# Patient Record
Sex: Female | Born: 1945 | Race: White | Hispanic: No | Marital: Married | Smoking: Never smoker
Health system: Southern US, Community
[De-identification: ages and names within clinical notes are randomized; demographics above are authoritative.]

## PROBLEM LIST (undated history)

## (undated) DIAGNOSIS — I809 Phlebitis and thrombophlebitis of unspecified site: Secondary | ICD-10-CM

## (undated) DIAGNOSIS — C50919 Malignant neoplasm of unspecified site of unspecified female breast: Secondary | ICD-10-CM

## (undated) DIAGNOSIS — R011 Cardiac murmur, unspecified: Secondary | ICD-10-CM

## (undated) DIAGNOSIS — R7989 Other specified abnormal findings of blood chemistry: Secondary | ICD-10-CM

## (undated) DIAGNOSIS — K219 Gastro-esophageal reflux disease without esophagitis: Secondary | ICD-10-CM

## (undated) DIAGNOSIS — T148XXA Other injury of unspecified body region, initial encounter: Secondary | ICD-10-CM

## (undated) DIAGNOSIS — M8580 Other specified disorders of bone density and structure, unspecified site: Secondary | ICD-10-CM

## (undated) DIAGNOSIS — M5090 Cervical disc disorder, unspecified, unspecified cervical region: Secondary | ICD-10-CM

## (undated) DIAGNOSIS — M1990 Unspecified osteoarthritis, unspecified site: Secondary | ICD-10-CM

## (undated) DIAGNOSIS — R109 Unspecified abdominal pain: Secondary | ICD-10-CM

## (undated) DIAGNOSIS — I10 Essential (primary) hypertension: Secondary | ICD-10-CM

## (undated) DIAGNOSIS — G454 Transient global amnesia: Secondary | ICD-10-CM

## (undated) DIAGNOSIS — M4800 Spinal stenosis, site unspecified: Secondary | ICD-10-CM

## (undated) DIAGNOSIS — R945 Abnormal results of liver function studies: Secondary | ICD-10-CM

## (undated) HISTORY — DX: Essential (primary) hypertension: I10

## (undated) HISTORY — PX: TONSILLECTOMY: SUR1361

## (undated) HISTORY — DX: Cervical disc disorder, unspecified, unspecified cervical region: M50.90

## (undated) HISTORY — DX: Unspecified osteoarthritis, unspecified site: M19.90

## (undated) HISTORY — DX: Spinal stenosis, site unspecified: M48.00

## (undated) HISTORY — PX: CHOLECYSTECTOMY: SHX55

## (undated) HISTORY — PX: FOOT SURGERY: SHX648

## (undated) HISTORY — DX: Other injury of unspecified body region, initial encounter: T14.8XXA

## (undated) HISTORY — PX: KNEE ARTHROSCOPY: SUR90

## (undated) HISTORY — DX: Transient global amnesia: G45.4

## (undated) HISTORY — DX: Gastro-esophageal reflux disease without esophagitis: K21.9

## (undated) HISTORY — PX: HAND SURGERY: SHX662

## (undated) HISTORY — DX: Cardiac murmur, unspecified: R01.1

## (undated) HISTORY — PX: TOTAL KNEE ARTHROPLASTY: SHX125

## (undated) HISTORY — DX: Abnormal results of liver function studies: R94.5

## (undated) HISTORY — DX: Phlebitis and thrombophlebitis of unspecified site: I80.9

## (undated) HISTORY — DX: Other specified abnormal findings of blood chemistry: R79.89

## (undated) HISTORY — DX: Malignant neoplasm of unspecified site of unspecified female breast: C50.919

## (undated) HISTORY — PX: BUNIONECTOMY: SHX129

## (undated) HISTORY — DX: Other specified disorders of bone density and structure, unspecified site: M85.80

## (undated) HISTORY — DX: Unspecified abdominal pain: R10.9

## (undated) HISTORY — PX: REPLACEMENT UNICONDYLAR JOINT KNEE: SUR1227

---

## 2001-02-14 LAB — HM COLONOSCOPY

## 2003-02-03 ENCOUNTER — Other Ambulatory Visit: Admission: RE | Admit: 2003-02-03 | Discharge: 2003-02-03 | Payer: Self-pay | Admitting: Internal Medicine

## 2003-06-13 ENCOUNTER — Encounter: Payer: Self-pay | Admitting: Internal Medicine

## 2003-07-14 ENCOUNTER — Encounter: Payer: Self-pay | Admitting: Internal Medicine

## 2003-09-18 ENCOUNTER — Encounter: Payer: Self-pay | Admitting: Internal Medicine

## 2003-12-30 ENCOUNTER — Ambulatory Visit: Payer: Self-pay | Admitting: Internal Medicine

## 2004-03-01 ENCOUNTER — Ambulatory Visit: Payer: Self-pay | Admitting: Internal Medicine

## 2004-03-06 ENCOUNTER — Emergency Department (HOSPITAL_COMMUNITY): Admission: EM | Admit: 2004-03-06 | Discharge: 2004-03-06 | Payer: Self-pay | Admitting: Emergency Medicine

## 2004-03-31 ENCOUNTER — Other Ambulatory Visit: Admission: RE | Admit: 2004-03-31 | Discharge: 2004-03-31 | Payer: Self-pay | Admitting: Internal Medicine

## 2004-03-31 ENCOUNTER — Ambulatory Visit: Payer: Self-pay | Admitting: Internal Medicine

## 2005-01-11 ENCOUNTER — Encounter: Admission: RE | Admit: 2005-01-11 | Discharge: 2005-01-11 | Payer: Self-pay | Admitting: Internal Medicine

## 2005-03-29 ENCOUNTER — Ambulatory Visit: Payer: Self-pay | Admitting: Internal Medicine

## 2005-04-06 ENCOUNTER — Ambulatory Visit: Payer: Self-pay | Admitting: Internal Medicine

## 2005-04-06 ENCOUNTER — Other Ambulatory Visit: Admission: RE | Admit: 2005-04-06 | Discharge: 2005-04-06 | Payer: Self-pay | Admitting: Internal Medicine

## 2005-04-06 ENCOUNTER — Encounter: Payer: Self-pay | Admitting: Internal Medicine

## 2005-05-03 ENCOUNTER — Ambulatory Visit: Payer: Self-pay | Admitting: Internal Medicine

## 2005-05-10 ENCOUNTER — Ambulatory Visit: Payer: Self-pay | Admitting: Internal Medicine

## 2005-05-13 ENCOUNTER — Encounter: Admission: RE | Admit: 2005-05-13 | Discharge: 2005-05-13 | Payer: Self-pay | Admitting: Internal Medicine

## 2005-06-10 ENCOUNTER — Ambulatory Visit: Payer: Self-pay | Admitting: Internal Medicine

## 2005-06-17 ENCOUNTER — Ambulatory Visit: Payer: Self-pay | Admitting: Internal Medicine

## 2005-08-03 ENCOUNTER — Ambulatory Visit: Payer: Self-pay | Admitting: Internal Medicine

## 2005-09-08 ENCOUNTER — Encounter: Payer: Self-pay | Admitting: Internal Medicine

## 2005-09-16 ENCOUNTER — Encounter: Admission: RE | Admit: 2005-09-16 | Discharge: 2005-09-16 | Payer: Self-pay | Admitting: Rheumatology

## 2005-10-10 ENCOUNTER — Encounter: Payer: Self-pay | Admitting: Internal Medicine

## 2005-11-22 ENCOUNTER — Encounter: Payer: Self-pay | Admitting: Internal Medicine

## 2005-11-30 ENCOUNTER — Encounter: Admission: RE | Admit: 2005-11-30 | Discharge: 2005-11-30 | Payer: Self-pay | Admitting: Rheumatology

## 2005-12-14 ENCOUNTER — Encounter: Payer: Self-pay | Admitting: Internal Medicine

## 2006-04-28 ENCOUNTER — Encounter: Payer: Self-pay | Admitting: Internal Medicine

## 2006-05-23 ENCOUNTER — Ambulatory Visit: Payer: Self-pay | Admitting: Internal Medicine

## 2006-05-23 LAB — CONVERTED CEMR LAB
ALT: 23 units/L (ref 0–40)
AST: 24 units/L (ref 0–37)
Albumin: 4.2 g/dL (ref 3.5–5.2)
Alkaline Phosphatase: 82 units/L (ref 39–117)
BUN: 16 mg/dL (ref 6–23)
Basophils Absolute: 0 10*3/uL (ref 0.0–0.1)
Basophils Relative: 0.6 % (ref 0.0–1.0)
Bilirubin, Direct: 0.1 mg/dL (ref 0.0–0.3)
CO2: 33 meq/L — ABNORMAL HIGH (ref 19–32)
Calcium: 9.7 mg/dL (ref 8.4–10.5)
Chloride: 107 meq/L (ref 96–112)
Cholesterol: 181 mg/dL (ref 0–200)
Creatinine, Ser: 0.8 mg/dL (ref 0.4–1.2)
Eosinophils Absolute: 0.2 10*3/uL (ref 0.0–0.6)
Eosinophils Relative: 2.8 % (ref 0.0–5.0)
GFR calc Af Amer: 94 mL/min
GFR calc non Af Amer: 78 mL/min
Glucose, Bld: 98 mg/dL (ref 70–99)
HCT: 40.3 % (ref 36.0–46.0)
HDL: 57.6 mg/dL (ref >39.0)
Hemoglobin: 13.9 g/dL (ref 12.0–15.0)
LDL Cholesterol: 103 mg/dL — ABNORMAL HIGH (ref 0–99)
Lymphocytes Relative: 19.7 % (ref 12.0–46.0)
MCHC: 34.5 g/dL (ref 30.0–36.0)
MCV: 96.3 fL (ref 78.0–100.0)
Monocytes Absolute: 0.4 10*3/uL (ref 0.2–0.7)
Monocytes Relative: 6.6 % (ref 3.0–11.0)
Neutro Abs: 4 10*3/uL (ref 1.4–7.7)
Neutrophils Relative %: 70.3 % (ref 43.0–77.0)
Platelets: 253 10*3/uL (ref 150–400)
Potassium: 4.1 meq/L (ref 3.5–5.1)
RBC: 4.18 M/uL (ref 3.87–5.11)
RDW: 13.6 % (ref 11.5–14.6)
Sodium: 147 meq/L — ABNORMAL HIGH (ref 135–145)
TSH: 1.69 microintl units/mL (ref 0.35–5.50)
Total Bilirubin: 0.8 mg/dL (ref 0.3–1.2)
Total CHOL/HDL Ratio: 3.1
Total Protein: 7.3 g/dL (ref 6.0–8.3)
Triglycerides: 103 mg/dL (ref 0–149)
VLDL: 21 mg/dL (ref 0–40)
WBC: 5.7 10*3/uL (ref 4.5–10.5)

## 2006-05-31 ENCOUNTER — Encounter: Payer: Self-pay | Admitting: Internal Medicine

## 2006-05-31 ENCOUNTER — Ambulatory Visit: Payer: Self-pay | Admitting: Internal Medicine

## 2006-05-31 ENCOUNTER — Other Ambulatory Visit: Admission: RE | Admit: 2006-05-31 | Discharge: 2006-05-31 | Payer: Self-pay | Admitting: Internal Medicine

## 2006-05-31 LAB — CONVERTED CEMR LAB

## 2006-06-02 ENCOUNTER — Encounter: Payer: Self-pay | Admitting: Internal Medicine

## 2006-06-02 ENCOUNTER — Ambulatory Visit: Payer: Self-pay | Admitting: Internal Medicine

## 2006-07-05 ENCOUNTER — Ambulatory Visit: Payer: Self-pay | Admitting: Internal Medicine

## 2006-07-05 LAB — CONVERTED CEMR LAB
Calcium, Total (PTH): 9.7 mg/dL (ref 8.4–10.5)
PTH: 40.1 pg/mL (ref 14.0–72.0)
Vit D, 1,25-Dihydroxy: 40 (ref 20–57)

## 2006-07-12 ENCOUNTER — Ambulatory Visit: Payer: Self-pay | Admitting: Internal Medicine

## 2006-08-17 ENCOUNTER — Encounter: Payer: Self-pay | Admitting: Internal Medicine

## 2006-08-17 DIAGNOSIS — M1990 Unspecified osteoarthritis, unspecified site: Secondary | ICD-10-CM | POA: Insufficient documentation

## 2006-08-17 DIAGNOSIS — J309 Allergic rhinitis, unspecified: Secondary | ICD-10-CM | POA: Insufficient documentation

## 2006-08-17 DIAGNOSIS — K219 Gastro-esophageal reflux disease without esophagitis: Secondary | ICD-10-CM | POA: Insufficient documentation

## 2006-08-17 DIAGNOSIS — G479 Sleep disorder, unspecified: Secondary | ICD-10-CM | POA: Insufficient documentation

## 2006-09-12 ENCOUNTER — Encounter: Payer: Self-pay | Admitting: Internal Medicine

## 2006-10-06 ENCOUNTER — Encounter: Admission: RE | Admit: 2006-10-06 | Discharge: 2006-10-06 | Payer: Self-pay | Admitting: Internal Medicine

## 2006-11-13 ENCOUNTER — Telehealth: Payer: Self-pay | Admitting: Internal Medicine

## 2006-12-04 ENCOUNTER — Ambulatory Visit: Payer: Self-pay | Admitting: Internal Medicine

## 2007-04-04 ENCOUNTER — Encounter: Payer: Self-pay | Admitting: Internal Medicine

## 2007-04-17 ENCOUNTER — Telehealth (INDEPENDENT_AMBULATORY_CARE_PROVIDER_SITE_OTHER): Payer: Self-pay | Admitting: *Deleted

## 2007-05-29 ENCOUNTER — Ambulatory Visit: Payer: Self-pay | Admitting: Internal Medicine

## 2007-05-29 LAB — CONVERTED CEMR LAB
Bilirubin Urine: NEGATIVE
Blood in Urine, dipstick: NEGATIVE
Glucose, Urine, Semiquant: NEGATIVE
Ketones, urine, test strip: NEGATIVE
Nitrite: NEGATIVE
Protein, U semiquant: NEGATIVE
Specific Gravity, Urine: 1.02
Urobilinogen, UA: 0.2
WBC Urine, dipstick: NEGATIVE
pH: 5.5

## 2007-06-05 LAB — CONVERTED CEMR LAB
ALT: 40 units/L — ABNORMAL HIGH (ref 0–35)
AST: 41 units/L — ABNORMAL HIGH (ref 0–37)
Albumin: 4.1 g/dL (ref 3.5–5.2)
Alkaline Phosphatase: 94 units/L (ref 39–117)
BUN: 22 mg/dL (ref 6–23)
Basophils Absolute: 0 10*3/uL (ref 0.0–0.1)
Basophils Relative: 0 % (ref 0.0–1.0)
Bilirubin, Direct: 0.1 mg/dL (ref 0.0–0.3)
CO2: 32 meq/L (ref 19–32)
Calcium: 9.7 mg/dL (ref 8.4–10.5)
Chloride: 106 meq/L (ref 96–112)
Cholesterol: 225 mg/dL (ref 0–200)
Creatinine, Ser: 0.7 mg/dL (ref 0.4–1.2)
Direct LDL: 132.4 mg/dL
Eosinophils Absolute: 0.2 10*3/uL (ref 0.0–0.7)
Eosinophils Relative: 5 % (ref 0.0–5.0)
GFR calc Af Amer: 109 mL/min
GFR calc non Af Amer: 90 mL/min
Glucose, Bld: 68 mg/dL — ABNORMAL LOW (ref 70–99)
HCT: 43 % (ref 36.0–46.0)
HDL: 81.3 mg/dL (ref >39.0)
Hemoglobin: 14.5 g/dL (ref 12.0–15.0)
Lymphocytes Relative: 29.1 % (ref 12.0–46.0)
MCHC: 33.7 g/dL (ref 30.0–36.0)
MCV: 97.2 fL (ref 78.0–100.0)
Monocytes Absolute: 0.3 10*3/uL (ref 0.1–1.0)
Monocytes Relative: 7.6 % (ref 3.0–12.0)
Neutro Abs: 2.6 10*3/uL (ref 1.4–7.7)
Neutrophils Relative %: 58.3 % (ref 43.0–77.0)
Platelets: 207 10*3/uL (ref 150–400)
Potassium: 4.8 meq/L (ref 3.5–5.1)
RBC: 4.42 M/uL (ref 3.87–5.11)
RDW: 12.7 % (ref 11.5–14.6)
Sodium: 144 meq/L (ref 135–145)
TSH: 2.77 microintl units/mL (ref 0.35–5.50)
Total Bilirubin: 0.6 mg/dL (ref 0.3–1.2)
Total CHOL/HDL Ratio: 2.8
Total Protein: 7.2 g/dL (ref 6.0–8.3)
Triglycerides: 54 mg/dL (ref 0–149)
VLDL: 11 mg/dL (ref 0–40)
WBC: 4.4 10*3/uL — ABNORMAL LOW (ref 4.5–10.5)

## 2007-06-06 ENCOUNTER — Ambulatory Visit: Payer: Self-pay | Admitting: Internal Medicine

## 2007-06-06 ENCOUNTER — Other Ambulatory Visit: Admission: RE | Admit: 2007-06-06 | Discharge: 2007-06-06 | Payer: Self-pay | Admitting: Internal Medicine

## 2007-06-06 ENCOUNTER — Encounter: Payer: Self-pay | Admitting: Internal Medicine

## 2007-06-06 DIAGNOSIS — I8390 Asymptomatic varicose veins of unspecified lower extremity: Secondary | ICD-10-CM | POA: Insufficient documentation

## 2007-06-06 DIAGNOSIS — R740 Nonspecific elevation of levels of transaminase and lactic acid dehydrogenase [LDH]: Secondary | ICD-10-CM

## 2007-06-06 DIAGNOSIS — R011 Cardiac murmur, unspecified: Secondary | ICD-10-CM | POA: Insufficient documentation

## 2007-06-06 DIAGNOSIS — R0989 Other specified symptoms and signs involving the circulatory and respiratory systems: Secondary | ICD-10-CM | POA: Insufficient documentation

## 2007-06-06 LAB — CONVERTED CEMR LAB: Pap Smear: NORMAL

## 2007-06-27 ENCOUNTER — Encounter: Payer: Self-pay | Admitting: Internal Medicine

## 2007-07-03 ENCOUNTER — Encounter: Payer: Self-pay | Admitting: Internal Medicine

## 2007-07-03 ENCOUNTER — Ambulatory Visit: Payer: Self-pay

## 2007-08-09 ENCOUNTER — Encounter: Payer: Self-pay | Admitting: Internal Medicine

## 2007-10-11 ENCOUNTER — Emergency Department (HOSPITAL_COMMUNITY): Admission: EM | Admit: 2007-10-11 | Discharge: 2007-10-11 | Payer: Self-pay | Admitting: Emergency Medicine

## 2007-10-12 ENCOUNTER — Ambulatory Visit: Payer: Self-pay | Admitting: Internal Medicine

## 2007-10-12 DIAGNOSIS — G454 Transient global amnesia: Secondary | ICD-10-CM | POA: Insufficient documentation

## 2007-10-12 DIAGNOSIS — I10 Essential (primary) hypertension: Secondary | ICD-10-CM | POA: Insufficient documentation

## 2007-10-24 ENCOUNTER — Encounter: Payer: Self-pay | Admitting: Internal Medicine

## 2007-11-21 ENCOUNTER — Ambulatory Visit: Payer: Self-pay | Admitting: Vascular Surgery

## 2007-12-03 ENCOUNTER — Ambulatory Visit: Payer: Self-pay | Admitting: Internal Medicine

## 2007-12-05 ENCOUNTER — Ambulatory Visit: Payer: Self-pay | Admitting: Vascular Surgery

## 2007-12-21 ENCOUNTER — Ambulatory Visit: Payer: Self-pay | Admitting: Vascular Surgery

## 2007-12-24 ENCOUNTER — Telehealth: Payer: Self-pay | Admitting: *Deleted

## 2008-01-03 ENCOUNTER — Encounter: Payer: Self-pay | Admitting: Internal Medicine

## 2008-02-15 LAB — HM DEXA SCAN

## 2008-03-07 ENCOUNTER — Encounter: Payer: Self-pay | Admitting: Internal Medicine

## 2008-03-13 ENCOUNTER — Encounter: Payer: Self-pay | Admitting: Internal Medicine

## 2008-05-19 ENCOUNTER — Ambulatory Visit: Payer: Self-pay | Admitting: Internal Medicine

## 2008-05-19 LAB — CONVERTED CEMR LAB
ALT: 35 units/L (ref 0–35)
AST: 42 units/L — ABNORMAL HIGH (ref 0–37)
Albumin: 4.1 g/dL (ref 3.5–5.2)
Alkaline Phosphatase: 88 units/L (ref 39–117)
BUN: 21 mg/dL (ref 6–23)
Basophils Absolute: 0 10*3/uL (ref 0.0–0.1)
Basophils Relative: 0.2 % (ref 0.0–3.0)
Bilirubin Urine: NEGATIVE
Bilirubin, Direct: 0.1 mg/dL (ref 0.0–0.3)
Blood in Urine, dipstick: NEGATIVE
CO2: 24 meq/L (ref 19–32)
Calcium: 9.6 mg/dL (ref 8.4–10.5)
Chloride: 106 meq/L (ref 96–112)
Cholesterol: 218 mg/dL — ABNORMAL HIGH (ref 0–200)
Creatinine, Ser: 0.8 mg/dL (ref 0.4–1.2)
Direct LDL: 126.1 mg/dL
Eosinophils Absolute: 0.4 10*3/uL (ref 0.0–0.7)
Eosinophils Relative: 7.9 % — ABNORMAL HIGH (ref 0.0–5.0)
GFR calc non Af Amer: 76.83 mL/min (ref >60)
Glucose, Bld: 89 mg/dL (ref 70–99)
Glucose, Urine, Semiquant: NEGATIVE
HCT: 40 % (ref 36.0–46.0)
HDL: 66.7 mg/dL (ref >39.00)
Hemoglobin: 13.6 g/dL (ref 12.0–15.0)
Ketones, urine, test strip: NEGATIVE
Lymphocytes Relative: 27.8 % (ref 12.0–46.0)
Lymphs Abs: 1.3 10*3/uL (ref 0.7–4.0)
MCHC: 34.1 g/dL (ref 30.0–36.0)
MCV: 96.8 fL (ref 78.0–100.0)
Monocytes Absolute: 0.4 10*3/uL (ref 0.1–1.0)
Monocytes Relative: 7.7 % (ref 3.0–12.0)
Neutro Abs: 2.7 10*3/uL (ref 1.4–7.7)
Neutrophils Relative %: 56.4 % (ref 43.0–77.0)
Nitrite: NEGATIVE
Platelets: 207 10*3/uL (ref 150.0–400.0)
Potassium: 5.5 meq/L — ABNORMAL HIGH (ref 3.5–5.1)
RBC: 4.13 M/uL (ref 3.87–5.11)
RDW: 12.3 % (ref 11.5–14.6)
Sodium: 143 meq/L (ref 135–145)
Specific Gravity, Urine: 1.025
TSH: 1.92 microintl units/mL (ref 0.35–5.50)
Total Bilirubin: 0.7 mg/dL (ref 0.3–1.2)
Total CHOL/HDL Ratio: 3
Total Protein: 6.9 g/dL (ref 6.0–8.3)
Triglycerides: 97 mg/dL (ref 0.0–149.0)
Urobilinogen, UA: 0.2
VLDL: 19.4 mg/dL (ref 0.0–40.0)
Vit D, 25-Hydroxy: 36 ng/mL (ref 30–89)
WBC Urine, dipstick: NEGATIVE
WBC: 4.8 10*3/uL (ref 4.5–10.5)
pH: 7

## 2008-05-27 ENCOUNTER — Ambulatory Visit: Payer: Self-pay | Admitting: Internal Medicine

## 2008-05-27 DIAGNOSIS — E875 Hyperkalemia: Secondary | ICD-10-CM | POA: Insufficient documentation

## 2008-05-27 DIAGNOSIS — D485 Neoplasm of uncertain behavior of skin: Secondary | ICD-10-CM | POA: Insufficient documentation

## 2008-06-10 LAB — CONVERTED CEMR LAB
BUN: 25 mg/dL — ABNORMAL HIGH (ref 6–23)
CO2: 32 meq/L (ref 19–32)
Calcium: 9.7 mg/dL (ref 8.4–10.5)
Chloride: 104 meq/L (ref 96–112)
Creatinine, Ser: 1 mg/dL (ref 0.4–1.2)
GFR calc non Af Amer: 59.38 mL/min (ref >60)
Glucose, Bld: 81 mg/dL (ref 70–99)
Potassium: 5 meq/L (ref 3.5–5.1)
Sodium: 142 meq/L (ref 135–145)

## 2008-07-15 ENCOUNTER — Encounter: Payer: Self-pay | Admitting: *Deleted

## 2008-07-15 ENCOUNTER — Encounter: Payer: Self-pay | Admitting: Internal Medicine

## 2008-07-15 LAB — CONVERTED CEMR LAB
ALT: 31 units/L
AST: 26 units/L
Creatinine, Ser: 1.03 mg/dL
HCT: 39.7 %
Hemoglobin: 12.9 g/dL

## 2008-07-28 ENCOUNTER — Ambulatory Visit: Payer: Self-pay | Admitting: Internal Medicine

## 2008-07-28 ENCOUNTER — Encounter: Payer: Self-pay | Admitting: Internal Medicine

## 2008-07-28 ENCOUNTER — Encounter: Payer: Self-pay | Admitting: *Deleted

## 2008-07-29 ENCOUNTER — Encounter: Payer: Self-pay | Admitting: Internal Medicine

## 2008-08-13 ENCOUNTER — Encounter: Payer: Self-pay | Admitting: Internal Medicine

## 2008-08-22 ENCOUNTER — Encounter: Admission: RE | Admit: 2008-08-22 | Discharge: 2008-08-22 | Payer: Self-pay | Admitting: Neurosurgery

## 2008-09-22 ENCOUNTER — Ambulatory Visit: Payer: Self-pay | Admitting: Internal Medicine

## 2008-09-22 LAB — CONVERTED CEMR LAB
ALT: 37 units/L — ABNORMAL HIGH (ref 0–35)
AST: 27 units/L (ref 0–37)
Albumin: 4.1 g/dL (ref 3.5–5.2)
Alkaline Phosphatase: 79 units/L (ref 39–117)
Bilirubin, Direct: 0 mg/dL (ref 0.0–0.3)
Total Bilirubin: 0.8 mg/dL (ref 0.3–1.2)
Total Protein: 7.4 g/dL (ref 6.0–8.3)

## 2008-09-30 ENCOUNTER — Ambulatory Visit: Payer: Self-pay | Admitting: Internal Medicine

## 2008-09-30 DIAGNOSIS — M549 Dorsalgia, unspecified: Secondary | ICD-10-CM | POA: Insufficient documentation

## 2008-10-13 ENCOUNTER — Encounter: Payer: Self-pay | Admitting: Internal Medicine

## 2008-12-24 ENCOUNTER — Encounter: Admission: RE | Admit: 2008-12-24 | Discharge: 2008-12-24 | Payer: Self-pay | Admitting: Neurosurgery

## 2008-12-25 ENCOUNTER — Encounter (INDEPENDENT_AMBULATORY_CARE_PROVIDER_SITE_OTHER): Payer: Self-pay | Admitting: *Deleted

## 2009-01-12 ENCOUNTER — Encounter (INDEPENDENT_AMBULATORY_CARE_PROVIDER_SITE_OTHER): Payer: Self-pay | Admitting: *Deleted

## 2009-01-16 ENCOUNTER — Ambulatory Visit: Payer: Self-pay | Admitting: Internal Medicine

## 2009-01-16 LAB — CONVERTED CEMR LAB
ALT: 30 units/L (ref 0–35)
AST: 28 units/L (ref 0–37)
Albumin: 3.9 g/dL (ref 3.5–5.2)
Alkaline Phosphatase: 77 units/L (ref 39–117)
Bilirubin, Direct: 0.1 mg/dL (ref 0.0–0.3)
Total Bilirubin: 0.8 mg/dL (ref 0.3–1.2)
Total Protein: 6.7 g/dL (ref 6.0–8.3)

## 2009-01-26 ENCOUNTER — Ambulatory Visit: Payer: Self-pay | Admitting: Internal Medicine

## 2009-01-26 DIAGNOSIS — R259 Unspecified abnormal involuntary movements: Secondary | ICD-10-CM | POA: Insufficient documentation

## 2009-01-26 DIAGNOSIS — R209 Unspecified disturbances of skin sensation: Secondary | ICD-10-CM | POA: Insufficient documentation

## 2009-02-14 LAB — HM MAMMOGRAPHY

## 2009-04-17 ENCOUNTER — Encounter: Payer: Self-pay | Admitting: Internal Medicine

## 2009-04-23 ENCOUNTER — Encounter: Payer: Self-pay | Admitting: Internal Medicine

## 2009-06-16 ENCOUNTER — Ambulatory Visit: Payer: Self-pay | Admitting: Internal Medicine

## 2009-06-16 LAB — CONVERTED CEMR LAB
ALT: 23 units/L (ref 0–35)
AST: 26 units/L (ref 0–37)
Albumin: 4.1 g/dL (ref 3.5–5.2)
Alkaline Phosphatase: 81 units/L (ref 39–117)
BUN: 18 mg/dL (ref 6–23)
Basophils Absolute: 0.1 10*3/uL (ref 0.0–0.1)
Basophils Relative: 1.3 % (ref 0.0–3.0)
Bilirubin Urine: NEGATIVE
Bilirubin, Direct: 0 mg/dL (ref 0.0–0.3)
Blood in Urine, dipstick: NEGATIVE
CO2: 31 meq/L (ref 19–32)
Calcium: 9.5 mg/dL (ref 8.4–10.5)
Chloride: 105 meq/L (ref 96–112)
Cholesterol: 198 mg/dL (ref 0–200)
Creatinine, Ser: 0.9 mg/dL (ref 0.4–1.2)
Eosinophils Absolute: 0.3 10*3/uL (ref 0.0–0.7)
Eosinophils Relative: 8.7 % — ABNORMAL HIGH (ref 0.0–5.0)
GFR calc non Af Amer: 66.84 mL/min (ref >60)
Glucose, Bld: 85 mg/dL (ref 70–99)
Glucose, Urine, Semiquant: NEGATIVE
HCT: 40.1 % (ref 36.0–46.0)
HDL: 67.6 mg/dL (ref >39.00)
Hemoglobin: 13.8 g/dL (ref 12.0–15.0)
Ketones, urine, test strip: NEGATIVE
LDL Cholesterol: 112 mg/dL — ABNORMAL HIGH (ref 0–99)
Lymphocytes Relative: 31.8 % (ref 12.0–46.0)
Lymphs Abs: 1.2 10*3/uL (ref 0.7–4.0)
MCHC: 34.3 g/dL (ref 30.0–36.0)
MCV: 98 fL (ref 78.0–100.0)
Monocytes Absolute: 0.3 10*3/uL (ref 0.1–1.0)
Monocytes Relative: 7.6 % (ref 3.0–12.0)
Neutro Abs: 1.9 10*3/uL (ref 1.4–7.7)
Neutrophils Relative %: 50.6 % (ref 43.0–77.0)
Nitrite: NEGATIVE
Platelets: 205 10*3/uL (ref 150.0–400.0)
Potassium: 5.1 meq/L (ref 3.5–5.1)
Protein, U semiquant: NEGATIVE
RBC: 4.09 M/uL (ref 3.87–5.11)
RDW: 13.7 % (ref 11.5–14.6)
Sodium: 142 meq/L (ref 135–145)
Specific Gravity, Urine: 1.02
TSH: 3.09 microintl units/mL (ref 0.35–5.50)
Total Bilirubin: 0.5 mg/dL (ref 0.3–1.2)
Total CHOL/HDL Ratio: 3
Total Protein: 6.9 g/dL (ref 6.0–8.3)
Triglycerides: 94 mg/dL (ref 0.0–149.0)
Urobilinogen, UA: 0.2
VLDL: 18.8 mg/dL (ref 0.0–40.0)
WBC Urine, dipstick: NEGATIVE
WBC: 3.8 10*3/uL — ABNORMAL LOW (ref 4.5–10.5)
pH: 6

## 2009-06-23 ENCOUNTER — Ambulatory Visit: Payer: Self-pay | Admitting: Internal Medicine

## 2009-06-23 ENCOUNTER — Other Ambulatory Visit: Admission: RE | Admit: 2009-06-23 | Discharge: 2009-06-23 | Payer: Self-pay | Admitting: Internal Medicine

## 2009-06-26 LAB — CONVERTED CEMR LAB: Pap Smear: NEGATIVE

## 2009-07-05 DIAGNOSIS — M8580 Other specified disorders of bone density and structure, unspecified site: Secondary | ICD-10-CM | POA: Insufficient documentation

## 2009-09-02 ENCOUNTER — Encounter: Payer: Self-pay | Admitting: Internal Medicine

## 2009-09-03 ENCOUNTER — Encounter: Admission: RE | Admit: 2009-09-03 | Discharge: 2009-09-03 | Payer: Self-pay | Admitting: Internal Medicine

## 2009-09-17 ENCOUNTER — Telehealth: Payer: Self-pay | Admitting: *Deleted

## 2009-12-24 ENCOUNTER — Ambulatory Visit: Payer: Self-pay | Admitting: Internal Medicine

## 2010-02-04 ENCOUNTER — Telehealth: Payer: Self-pay | Admitting: *Deleted

## 2010-02-17 ENCOUNTER — Telehealth: Payer: Self-pay | Admitting: Internal Medicine

## 2010-02-17 ENCOUNTER — Encounter: Payer: Self-pay | Admitting: Internal Medicine

## 2010-02-18 ENCOUNTER — Ambulatory Visit
Admission: RE | Admit: 2010-02-18 | Discharge: 2010-02-18 | Payer: Self-pay | Source: Home / Self Care | Attending: Internal Medicine | Admitting: Internal Medicine

## 2010-02-18 DIAGNOSIS — R079 Chest pain, unspecified: Secondary | ICD-10-CM | POA: Insufficient documentation

## 2010-02-19 ENCOUNTER — Encounter: Payer: Self-pay | Admitting: Gastroenterology

## 2010-03-16 NOTE — Progress Notes (Signed)
Summary: new rx  Phone Note Call from Patient Call back at Home Phone [phone]   Call For: Madelin Headings MD Summary of Call: pt needs new rx clorazepate 7.5 mg call into [facility] [address] Initial call taken by: Heron Sabins,  September 17, 2009 9:27 AM  Follow-up for Phone Call        ok to refill x 4  Follow-up by: Madelin Headings MD,  September 17, 2009 11:06 AM  Additional Follow-up for Phone Call Additional follow up Details #1::        Rx called in. Additional Follow-up by: Romualdo Bolk, CMA (AAMA),  September 17, 2009 11:20 AM    Prescriptions: CLORAZEPATE DIPOTASSIUM 7.5 MG TABS (CLORAZEPATE DIPOTASSIUM) 1 by mouth at bedtime  #30 x 3   Entered by:   Romualdo Bolk, CMA (AAMA)   Authorized by:   Madelin Headings MD   Signed by:   Romualdo Bolk, CMA (AAMA) on 09/17/2009   Method used:   Telephoned to ...       [facility]  [address]  [phone]* (retail)       [address]       [place], [place]  [address]       Ph: [phone] or [phone]       Fax: [phone]   RxID:   [id number]

## 2010-03-16 NOTE — Letter (Signed)
Summary: [facility]  [facility]   Imported By: Maryln Gottron 06/29/2009 14:07:22  _____________________________________________________________________  External Attachment:    Type:   Image     Comment:   External Document

## 2010-03-16 NOTE — Letter (Signed)
Summary: [facility]  [facility]   Imported By: Maryln Gottron 06/29/2009 14:10:52  _____________________________________________________________________  External Attachment:    Type:   Image     Comment:   External Document

## 2010-03-16 NOTE — Consult Note (Signed)
Summary: [facility]  [facility]   Imported By: Maryln Gottron 06/29/2009 14:14:59  _____________________________________________________________________  External Attachment:    Type:   Image     Comment:   External Document

## 2010-03-16 NOTE — Letter (Signed)
Summary: [facility]  [facility]   Imported By: Maryln Gottron 10/27/2009 10:53:57  _____________________________________________________________________  External Attachment:    Type:   Image     Comment:   External Document

## 2010-03-16 NOTE — Assessment & Plan Note (Signed)
Summary: 6 month follow up/cjr   Vital Signs:  Patient profile:   65 year old female Menstrual status:  postmenopausal Weight:      129 pounds Pulse rate:   78 / minute BP sitting:   110 / 72  (left arm) Cuff size:   regular  Vitals Entered By: Romualdo Bolk, CMA (AAMA) (December 24, 2009 9:48 AM) CC: Follow-up visit , Hypertension Management   History of Present Illness: Briana Giles comes in today  for follow up of multiple medical problems .  She  Had left  foot surgery 3 weeks ago .   back not as good cause of exercise.  however she is doing quite well. She is taking tylenol and celebrex    as needed for pain tramadol  ocass with  a walk or so .   She continues to take Chlorazepate  one at night  to help with her sleep. She denies any rebound. HT :  no side effects of medication readings seemed to be good.Marland Kitchen    GERD : no change  no amnesiac memory loss or falling. She feels that her mood is pretty good.  Hypertension History:      She complains of peripheral edema and neurologic problems, but denies headache, chest pain, palpitations, dyspnea with exertion, orthopnea, PND, visual symptoms, syncope, and side effects from treatment.  She notes no problems with any antihypertensive medication side effects.  numbness in fingers that is mostly in rt hand.        Positive major cardiovascular risk factors include female age 66 years old or older and hypertension.  Negative major cardiovascular risk factors include non-tobacco-user status.     Preventive Screening-Counseling & Management  Alcohol-Tobacco     Alcohol drinks/day: 2     Alcohol type: all     Smoking Status: never  Caffeine-Diet-Exercise     Caffeine use/day: 2     Does Patient Exercise: yes  Current Medications (verified): 1)  Celebrex 200 Mg Caps (Celecoxib) 2)  Clorazepate Dipotassium 7.5 Mg Tabs (Clorazepate Dipotassium) .Marland Kitchen.. 1 By Mouth At Bedtime 3)  Nasonex 50 Mcg/act Susp (Mometasone Furoate) .... Use  1 Puff Both Nostils Daily 4)  Lisinopril 20 Mg Tabs (Lisinopril) .Marland Kitchen.. 1 By Mouth Q D  For High Blood Pressure 5)  Robaxin 500 Mg Tabs (Methocarbamol) 6)  Flexeril 10 Mg Tabs (Cyclobenzaprine Hcl) 7)  Calcium Carbonate   Powd (Calcium Carbonate) 8)  Multivitamins   Tabs (Multiple Vitamin) 9)  Vitamin D 1000 Unit  Tabs (Cholecalciferol) 10)  Aspirin 81 Mg  Tabs (Aspirin) 11)  Fish Oil   Oil (Fish Oil) 12)  Miralax  Powd (Polyethylene Glycol 3350) 13)  Senokot S 8.6-50 Mg Tabs (Sennosides-Docusate Sodium) 14)  Tramadol Hcl 50 Mg Tabs (Tramadol Hcl) 15)  Vicodin 5-500 Mg Tabs (Hydrocodone-Acetaminophen)  Allergies (verified): 1)  Codeine Phosphate (Codeine Phosphate)  Past History:  Past medical, surgical, family and social histories (including risk factors) reviewed, and no changes noted (except as noted below).  Past Medical History: Reviewed history from 06/23/2009 and no changes required. 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 Karlene Lineman Neurology: Dr. Terrace Arabia Orthopedics: Dr. Lestine Box Neurology: Dr. Corliss Skains Dr Phoebe Perch   Family History: Reviewed history from 06/23/2009 and no changes required. mom  need av r , copd , ra, osteoporosis     90 yrs    died  after Open heart surgery  Older brother 32 had mi    Family History Hypertension Family History Lung cancer Father etoh no dm    Family History of Alcoholism/Addiction father  Twin brother to have back surgery      Social History: Reviewed history from 06/23/2009 and  no changes required. 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    Etoh 1-2 most nights     Review of Systems  The patient denies anorexia, fever, weight loss, weight gain, vision loss, decreased hearing, chest pain, syncope, dyspnea on exertion, hemoptysis, abdominal pain, melena, hematochezia, severe indigestion/heartburn, hematuria, transient blindness, abnormal bleeding, and enlarged lymph nodes.    Physical Exam  General:  Well-developed,well-nourished,in no acute distress; alert,appropriate and cooperative throughout examination  has surgery boot on left foot Head:  Normocephalic and atraumatic without obvious abnormalities. No apparent alopecia or balding. Eyes:  PERRL, EOMs full, conjunctiva clear  Ears:  R ear normal and L ear normal.   Neck:  No deformities, masses, or tenderness noted. Lungs:  Normal respiratory effort, chest expands symmetrically. Lungs are clear to auscultation, no crackles or wheezes. Heart:  Normal rate and regular rhythm. S1 and S2 normal without gallop, murmur, click, rub or other extra sounds. Abdomen:  Bowel sounds positive,abdomen soft and non-tender without masses, organomegaly or   noted. Pulses:  pulses intact without delay   Neurologic:  alert & oriented X3.  seems nonfocal. No atrophy limited exam  no  tremors noted Skin:  turgor normal, color normal, no suspicious lesions, no ecchymoses, no petechiae, and no purpura.   Cervical Nodes:  No lymphadenopathy noted Psych:  Oriented X3, memory intact for recent and remote, normally interactive, not anxious appearing, and not depressed appearing.  much more relaxed   Impression & Recommendations:  Problem # 1:  SYMPTOM, DISTURBANCE, SLEEP NOS (ICD-780.50) ongoing but  does better with sleep hygiene   dis avoidance of alcohol near meds and bed time . She  appears  actually  to do better with this    Problem # 2:  HYPERTENSION (ICD-401.9) controlled  Her updated medication list for this problem includes:    Lisinopril 20 Mg Tabs (Lisinopril) .Marland Kitchen... 1 by mouth q d  for high blood pressure  BP today: 110/72 Prior BP: 120/80 (06/23/2009)  10 Yr Risk Heart Disease: 4 % Prior 10 Yr Risk Heart Disease: Not enough information (10/12/2007)  Labs Reviewed: K+: 5.1 (06/16/2009) Creat: : 0.9 (06/16/2009)   Chol: 198 (06/16/2009)   HDL: 67.60 (06/16/2009)   LDL: 112 (06/16/2009)   TG: 94.0 (06/16/2009)  Problem # 3:  OSTEOARTHRITIS (ICD-715.90) no significant change Her updated medication list for this problem includes:    Celebrex 200 Mg Caps (Celecoxib)    Aspirin 81 Mg Tabs (Aspirin)    Tramadol Hcl 50 Mg Tabs (Tramadol hcl)    Vicodin 5-500 Mg Tabs (Hydrocodone-acetaminophen)  Problem # 4:  BACK PAIN (ICD-724.5) stable Her updated medication list for this problem includes:    Celebrex 200 Mg  Caps (Celecoxib)    Robaxin 500 Mg Tabs (Methocarbamol)    Flexeril 10 Mg Tabs (Cyclobenzaprine hcl)    Aspirin 81 Mg Tabs (Aspirin)    Tramadol Hcl 50 Mg Tabs (Tramadol hcl)    Vicodin 5-500 Mg Tabs (Hydrocodone-acetaminophen)  Problem # 5:  GERD (ICD-530.81) no change  Problem # 6:  ALLERGIC RHINITIS (ICD-477.9) Assessment: Comment Only  Her updated medication list for this problem includes:    Nasonex 50 Mcg/act Susp (Mometasone furoate) ..... Use 1 puff both nostils daily  Complete Medication List: 1)  Celebrex 200 Mg Caps (Celecoxib) 2)  Clorazepate Dipotassium 7.5 Mg Tabs (Clorazepate dipotassium) .Marland Kitchen.. 1 by mouth at bedtime 3)  Nasonex 50 Mcg/act Susp (Mometasone furoate) .... Use 1 puff both nostils daily 4)  Lisinopril 20 Mg Tabs (Lisinopril) .Marland Kitchen.. 1 by mouth q d  for high blood pressure 5)  Robaxin 500 Mg Tabs (Methocarbamol) 6)  Flexeril 10 Mg Tabs (Cyclobenzaprine hcl) 7)  Calcium Carbonate Powd (Calcium carbonate) 8)  Multivitamins Tabs (Multiple vitamin) 9)  Vitamin D  1000 Unit Tabs (Cholecalciferol) 10)  Aspirin 81 Mg Tabs (Aspirin) 11)  Fish Oil Oil (Fish oil) 12)  Miralax Powd (Polyethylene glycol 3350) 13)  Senokot S 8.6-50 Mg Tabs (Sennosides-docusate sodium) 14)  Tramadol Hcl 50 Mg Tabs (Tramadol hcl) 15)  Vicodin 5-500 Mg Tabs (Hydrocodone-acetaminophen)  Other Orders: Flu Vaccine 22yrs + MEDICARE PATIENTS (Z6109) Administration Flu vaccine - MCR (U0454)  Hypertension Assessment/Plan:      The patient's hypertensive risk group is category B: At least one risk factor (excluding diabetes) with no target organ damage.  Her calculated 10 year risk of coronary heart disease is 4 %.  Today's blood pressure is 110/72.  Her blood pressure goal is < 140/90.  Patient Instructions: 1)  continue medications   as we discussed .  2)  wellness visit in May  2012.    3)  will do  labs at that visit  come in fasting   (Water and meds ok) Prescriptions: CLORAZEPATE DIPOTASSIUM 7.5 MG TABS (CLORAZEPATE DIPOTASSIUM) 1 by mouth at bedtime  #30 x 5   Entered and Authorized by:   Madelin Headings MD   Signed by:   Madelin Headings MD on 12/24/2009   Method used:   Print then Give to Patient   RxID:   [id number]    Orders Added: 1)  Flu Vaccine 61yrs + MEDICARE PATIENTS [Q2039] 2)  Administration Flu vaccine - MCR [G0008] 3)  Est. Patient Level IV [30865]            Flu Vaccine Consent Questions     Do you have a history of severe allergic reactions to this vaccine? no    Any prior history of allergic reactions to egg and/or gelatin? no    Do you have a sensitivity to the preservative Thimersol? no    Do you have a past history of Guillan-Barre Syndrome? no    Do you currently have an acute febrile illness? no    Have you ever had a severe reaction to latex? no    Vaccine information given and explained to patient? yes    Are you currently pregnant? no    Lot Number:[id number]   Exp Date:08/14/2010   Site Given  Left Deltoid IMbmedflu Romualdo Bolk, CMA (AAMA)  December 24, 2009 9:51 AM

## 2010-03-16 NOTE — Letter (Signed)
Summary: [facility]  [facility]   Imported By: Maryln Gottron 05/06/2009 10:35:54  _____________________________________________________________________  External Attachment:    Type:   Image     Comment:   External Document

## 2010-03-16 NOTE — Letter (Signed)
Summary: [facility]  [facility]   Imported By: Maryln Gottron 06/29/2009 14:12:10  _____________________________________________________________________  External Attachment:    Type:   Image     Comment:   External Document

## 2010-03-16 NOTE — Assessment & Plan Note (Signed)
Summary: cpx//ccm   Vital Signs:  Patient profile:   65 year old female Menstrual status:  postmenopausal Height:      60.5 inches Weight:      128 pounds BMI:     24.68 Pulse rate:   66 / minute BP sitting:   120 / 80  (right arm) Cuff size:   regular  Vitals Entered By: Romualdo Bolk, CMA (AAMA) (Jun 23, 2009 1:38 PM) CC: CPX with pap   History of Present Illness: Briana Giles comesin comes in today  for preventive visit . Since last visit  here  there have been no major changes in health status  .    MS  ORtho  problems include :Back knee and shoulders and elbow tendinitis.  and left foot    Hammer toe.  ? water therapy  tohelp her ortho issues. HT stable  on meds  Alelrgies: controlled on meds  Oseteopenia  : no fractures . 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  less stressful and more time for health  interventions.  Preventive Care Screening  Prior Values:    Pap Smear:  normal (06/06/2007)    Mammogram:  normal (10/16/2007)    Colonoscopy:  normal (02/14/2002)    Last Tetanus Booster:  Tdap (05/27/2008)   Preventive Screening-Counseling & Management  Alcohol-Tobacco     Alcohol drinks/day: 2     Alcohol type: all     Smoking Status: never  Caffeine-Diet-Exercise     Caffeine use/day: 2     Does Patient Exercise: yes  Hep-HIV-STD-Contraception     Dental Visit-last 6 months yes     Sun Exposure-Excessive: no  Safety-Violence-Falls     Seat Belt Use: yes     Firearms in the Home: no firearms in the home     Smoke Detectors: yes      Blood Transfusions:  no.    Current Medications (verified): 1)  Celebrex 200 Mg Caps (Celecoxib) 2)  Clorazepate Dipotassium 7.5 Mg Tabs (Clorazepate Dipotassium) .Marland Kitchen.. 1 By Mouth At Bedtime 3)  Nasonex 50 Mcg/act Susp (Mometasone Furoate) .... Use 1 Puff Both Nostils Daily 4)  Lisinopril 20 Mg Tabs (Lisinopril) .Marland Kitchen.. 1 By Mouth Q D  For High Blood Pressure 5)  Robaxin 500 Mg Tabs (Methocarbamol) 6)  Flexeril  10 Mg Tabs (Cyclobenzaprine Hcl) 7)  Calcium Carbonate   Powd (Calcium Carbonate) 8)  Multivitamins   Tabs (Multiple Vitamin) 9)  Vitamin D 1000 Unit  Tabs (Cholecalciferol) 10)  Red Yeast Rice   Powd (Red Yeast Rice Extract) 11)  Aspirin 81 Mg  Tabs (Aspirin) 12)  Fish Oil   Oil (Fish Oil) 13)  Miralax  Powd (Polyethylene Glycol 3350) 14)  Senokot S 8.6-50 Mg Tabs (Sennosides-Docusate Sodium) 15)  Tramadol Hcl 50 Mg Tabs (Tramadol Hcl) 16)  Vicodin 5-500 Mg Tabs (Hydrocodone-Acetaminophen)  Allergies (verified): 1)  Codeine Phosphate (Codeine Phosphate)  Past History:  Past Medical History: Allergic rhinitis GERD Osteoarthritis g3p2 heart murmur  echo 2002  normal herniated cervical disc no surgery 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 Karlene Lineman Neurology: Dr. Terrace Arabia Orthopedics: Dr. Lestine Box Neurology: Dr. 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    Etoh 1-2 most nights    Seat Belt Use:  yes Dental Care w/in 6 mos.:  yes Sun Exposure-Excessive:  no Blood Transfusions:  no  Review of Systems  The patient denies anorexia, fever, weight loss, weight gain, decreased hearing, chest pain, syncope, dyspnea on exertion, peripheral edema, prolonged cough, abdominal pain, melena, hematochezia, transient blindness, difficulty walking,  abnormal bleeding, and enlarged lymph nodes.         numbness some better    righ t? CTS.   constipation, DJD,    left foot bunion hammer toe and metatarsalgia  Physical Exam General Appearance: well developed, well nourished, no acute distress Eyes: conjunctiva and lids normal, PERRLA, EOMI, WNL Ears, Nose, Mouth, Throat: TM clear, nares clear, oral exam WNL Neck: supple, no lymphadenopathy, no thyromegaly, no JVD Respiratory: clear to auscultation and percussion, respiratory effort normal Cardiovascular: regular rate and rhythm, S1-S2, , rub or gallop, no bruits, peripheral pulses normal and symmetric, no cyanosis, clubbing, edema or varicosities Chest: no scars, masses, tenderness; no asymmetry, skin changes, nipple discharge   Gastrointestinal: soft, non-tender; no hepatosplenomegaly, masses; active bowel sounds all quadrants, guaiac negative stool; no masses, tenderness, hemorrhoids  Genitourinary: no vaginal discharge, lesions; no masses or tenderness pap done  Lymphatic: no cervical, axillary or inguinal adenopathy Musculoskeletal: gait normal, muscle tone and strength WNL, no joint swelling, effusions, discoloration, crepitus  except buntion toe deformities  Skin: clear, good turgor, color WNL, no rashes, lesions, or ulcerations Neurologic: normal mental status, normal reflexes, normal strength, sensation, and motion Psychiatric: alert; oriented to person, place and time Other Exam:     Impression & Recommendations:  Problem # 1:  Preventive Health Care (ICD-V70.0) Discussed nutrition,exercise,diet,healthy weight, vitamin D and calcium.   Problem # 2:  ROUTINE GYNECOLOGICAL EXAMINATION (ICD-V72.31)  form signed   Orders: Pap Smear, Thin Prep ( Collection of) (W1027)  Problem # 3:  HYPERTENSION (ICD-401.9)  Her updated medication list for this problem includes:    Lisinopril 20 Mg Tabs (Lisinopril) .Marland Kitchen... 1 by mouth q d  for high blood pressure  Problem # 4:  SYMPTOM,  DISTURBANCE, SLEEP NOS (ICD-780.50) on going  no change.  counseled   Problem # 5:  OSTEOARTHRITIS (ICD-715.90)  Her updated medication list for this problem includes:    Celebrex 200 Mg Caps (Celecoxib)    Aspirin 81 Mg Tabs (Aspirin)    Tramadol Hcl 50 Mg Tabs (Tramadol hcl)    Vicodin 5-500 Mg Tabs (Hydrocodone-acetaminophen)  Problem # 6:  OSTEOPENIA (ICD-733.90)  Her updated medication list for this problem includes:    Calcium Carbonate Powd (Calcium carbonate)    Vitamin D 1000 Unit Tabs (Cholecalciferol)  Problem # 7:  ALLERGIC RHINITIS (ICD-477.9) Assessment: Comment Only  Her updated medication list for this problem includes:    Nasonex 50 Mcg/act Susp (Mometasone furoate) ..... Use 1 puff both nostils daily  Complete Medication List: 1)  Celebrex 200 Mg Caps (Celecoxib) 2)  Clorazepate Dipotassium 7.5 Mg Tabs (Clorazepate  dipotassium) .Marland Kitchen.. 1 by mouth at bedtime 3)  Nasonex 50 Mcg/act Susp (Mometasone furoate) .... Use 1 puff both nostils daily 4)  Lisinopril 20 Mg Tabs (Lisinopril) .Marland Kitchen.. 1 by mouth q d  for high blood pressure 5)  Robaxin 500 Mg Tabs (Methocarbamol) 6)  Flexeril 10 Mg Tabs (Cyclobenzaprine hcl) 7)  Calcium Carbonate Powd (Calcium carbonate) 8)  Multivitamins Tabs (Multiple vitamin) 9)  Vitamin D 1000 Unit Tabs (Cholecalciferol) 10)  Red Yeast Rice Powd (Red yeast rice extract) 11)  Aspirin 81 Mg Tabs (Aspirin) 12)  Fish Oil Oil (Fish oil) 13)  Miralax Powd (Polyethylene glycol 3350) 14)  Senokot S 8.6-50 Mg Tabs (Sennosides-docusate sodium) 15)  Tramadol Hcl 50 Mg Tabs (Tramadol hcl) 16)  Vicodin 5-500 Mg Tabs (Hydrocodone-acetaminophen)  Patient Instructions: 1)  Get mammogram  2)  Agree with water therapy. 3)  return office visit in 6 months   4)  call for refills in the meantime  5)  Should get yearly  thyroid check s Prescriptions: CLORAZEPATE DIPOTASSIUM 7.5 MG TABS (CLORAZEPATE DIPOTASSIUM) 1 by mouth at bedtime  #30 x 2   Entered and  Authorized by:   Madelin Headings MD   Signed by:   Madelin Headings MD on 06/23/2009   Method used:   Print then Give to Patient   RxID:   [id number] LISINOPRIL 20 MG TABS (LISINOPRIL) 1 by mouth q d  for high blood pressure  #90 Each x 3   Entered and Authorized by:   Madelin Headings MD   Signed by:   Madelin Headings MD on 06/23/2009   Method used:   Electronically to        [facility]  [phone]* (retail)       [address]       [place], [place]  [address]       Ph: [phone] or [phone]       Fax: [phone]   RxID:   [id number]

## 2010-03-16 NOTE — Letter (Signed)
Summary: [facility]  [facility]   Imported By: Maryln Gottron 04/29/2009 12:51:29  _____________________________________________________________________  External Attachment:    Type:   Image     Comment:   External Document

## 2010-03-16 NOTE — Consult Note (Signed)
Summary: [facility]  [facility]   Imported By: Maryln Gottron 06/29/2009 14:13:46  _____________________________________________________________________  External Attachment:    Type:   Image     Comment:   External Document

## 2010-03-18 NOTE — Progress Notes (Signed)
Summary: Change nasonex  Phone Note From Pharmacy   Caller: [facility]  [address]  [phone]* Reason for Call: Medication not on formulary Summary of Call: Pt's nasonex is not on formulary. She needs to try flucticasone or veramyst. Can we change her to one of these? Initial call taken by: Romualdo Bolk, CMA Duncan Dull),  February 04, 2010 3:39 PM  Follow-up for Phone Call        change to flonase 1 spray each nostril at bedtime #1 rf 11 Follow-up by: Edwyna Perfect MD,  February 04, 2010 4:25 PM  Additional Follow-up for Phone Call Additional follow up Details #1::        rx sent to pharmacy and pt aware. Additional Follow-up by: Romualdo Bolk, CMA (AAMA),  February 04, 2010 4:32 PM    New/Updated Medications: FLONASE 50 MCG/ACT SUSP (FLUTICASONE PROPIONATE) 1 spray each nostril at bedtime Prescriptions: FLONASE 50 MCG/ACT SUSP (FLUTICASONE PROPIONATE) 1 spray each nostril at bedtime  #17 x 11   Entered by:   Romualdo Bolk, CMA (AAMA)   Authorized by:   Edwyna Perfect MD   Signed by:   Romualdo Bolk, CMA (AAMA) on 02/04/2010   Method used:   Electronically to        [facility]  [phone]* (retail)       [address]       [place], [place]  [address]       Ph: [phone] or [phone]       Fax: [phone]   RxID:   [id number]

## 2010-03-18 NOTE — Assessment & Plan Note (Signed)
Summary: GERD?/chest pain/dm   Vital Signs:  Patient profile:   65 year old female Menstrual status:  postmenopausal Weight:      133 pounds Pulse rate:   66 / minute BP sitting:   130 / 80  (left arm) Cuff size:   regular  Vitals Entered By: Romualdo Bolk, CMA (AAMA) (February 18, 2010 8:37 AM) CC: Pt saw [facility] on 1/4. They told her that the tingling in her hands was proably a disc problem and chest pain is proably acid reflux.   History of Present Illness: Briana Giles comes in today  for problem with chest discomfort.  she did seek care last pm but at an urgent care. ekg ok and was told above.  saw   probably a pa   at  new urgent care [place] who did ekg nl .  NO rx  given .   felt like when had gallbladder attack .    onset of high epigastric pain through to back and then after getting bubpy feeling  an take antacid and ? slight help   ...   pain not as bad as previous episode and lasted hours.  No sob or cough.    no related to eating.   ocass pill sticking issues but not recently.   Felt  like bra could be tight  feeling.  doing better.  not as bad today ?  what to take. takes clebrex not nsaids but takes small dose asa per day.  Bp ok  no change in meds .  Preventive Screening-Counseling & Management  Alcohol-Tobacco     Alcohol drinks/day: 2     Alcohol type: all     Smoking Status: never  Caffeine-Diet-Exercise     Caffeine use/day: 2     Does Patient Exercise: yes  Current Medications (verified): 1)  Celebrex 200 Mg Caps (Celecoxib) 2)  Clorazepate Dipotassium 7.5 Mg Tabs (Clorazepate Dipotassium) .Marland Kitchen.. 1 By Mouth At Bedtime 3)  Flonase 50 Mcg/act Susp (Fluticasone Propionate) .Marland Kitchen.. 1 Spray Each Nostril At Bedtime 4)  Lisinopril 20 Mg Tabs (Lisinopril) .Marland Kitchen.. 1 By Mouth Q D  For High Blood Pressure 5)  Robaxin 500 Mg Tabs (Methocarbamol) 6)  Flexeril 10 Mg Tabs (Cyclobenzaprine Hcl) 7)  Calcium Carbonate   Powd (Calcium Carbonate) 8)   Multivitamins   Tabs (Multiple Vitamin) 9)  Vitamin D 1000 Unit  Tabs (Cholecalciferol) 10)  Aspirin 81 Mg  Tabs (Aspirin) 11)  Fish Oil   Oil (Fish Oil) 12)  Miralax  Powd (Polyethylene Glycol 3350) 13)  Senokot S 8.6-50 Mg Tabs (Sennosides-Docusate Sodium) 14)  Tramadol Hcl 50 Mg Tabs (Tramadol Hcl) 15)  Vicodin 5-500 Mg Tabs (Hydrocodone-Acetaminophen) 16)  Zyflamend  Allergies (verified): 1)  Codeine Phosphate (Codeine Phosphate)  Past History:  Past medical, surgical, family and social histories (including risk factors) reviewed, and no changes noted (except as noted below).  Past Medical History: Reviewed history from 06/23/2009 and no changes required. 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 Rt Knee Arthroscopy Cholecystectomy r knee replacement Bunion surgery  Tonsillectomy foot surgery 10 2011  Past  History:  Care Management: Cardiology: Dr. Karlene Lineman Neurology: Dr. Terrace Arabia Orthopedics: Dr. Lestine Box Neurology: Dr. Corliss Skains Dr Phoebe Perch   Family History: Reviewed history from 06/23/2009 and no changes required. mom  need av r , copd , ra, osteoporosis     90 yrs    died  after Open heart surgery  Older brother 40 had mi    Family History Hypertension Family History Lung cancer Father etoh no dm    Family History of Alcoholism/Addiction father  Twin brother to have back surgery  mom had esophageal stricture  treated.      Social History: Reviewed history from 12/24/2009 and no changes required. 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    Etoh 1-2 most nights     Review of Systems       The patient complains of anorexia.  The patient denies fever, weight loss, weight gain, decreased hearing, syncope, dyspnea on exertion, peripheral edema, prolonged cough, hemoptysis, melena, hematochezia, hematuria, muscle weakness, abnormal bleeding, enlarged lymph nodes, and angioedema.         no water brash  Physical Exam  General:  Well-developed,well-nourished,in no acute distress; alert,appropriate and cooperative throughout examination Head:  normocephalic and atraumatic.   Eyes:  clear  Neck:  No deformities, masses, or tenderness noted. Lungs:  Normal respiratory effort, chest expands symmetrically. Lungs are clear to auscultation, no crackles or wheezes. Heart:  Normal rate and regular rhythm. S1 and S2 normal without gallop, murmur, click, rub or other extra sounds. Abdomen:  soft, normal bowel sounds, no masses, no guarding, no rigidity, no rebound tenderness, no hepatomegaly, and no splenomegaly.  points to area high epigastrum   no mass   Pulses:  nl cap refill  Neurologic:  non focal  Skin:  turgor normal, color normal, no ecchymoses, and no petechiae.   Cervical Nodes:  No lymphadenopathy noted Psych:  Oriented X3, good eye contact, not anxious appearing, and not depressed appearing.     Impression & Recommendations:  Problem # 1:  CHEST PAIN (ICD-786.50)  seems esophageal GI related  by hx and context .  ekg was normal and noassoicated cv signs  dis  medication management and poss need for endo because of severity.Marland Kitchen  stop the asa for now ad add ppi  and will do gi referral  EKG NAD sinus   Orders: Gastroenterology Referral (GI)  Problem # 2:  ESOPHAGEAL REFLUX (ICD-530.81)  rx for same and then  follow up call if worse in any way  Her updated medication list for this problem includes:    Omeprazole 20 Mg Cpdr (Omeprazole) .Marland Kitchen... 1 by mouth  once  daily  Orders: Gastroenterology Referral (GI)  Problem # 3:  HYPERTENSION (ICD-401.9)  Her updated medication list for this problem includes:    Lisinopril 20 Mg Tabs (Lisinopril) .Marland Kitchen... 1 by mouth q d  for high blood pressure  BP today: 130/80 Prior BP: 110/72 (12/24/2009)  Prior 10 Yr Risk Heart Disease: 4 % (12/24/2009)  Labs Reviewed: K+: 5.1 (06/16/2009) Creat: : 0.9 (06/16/2009)   Chol: 198 (06/16/2009)   HDL: 67.60 (06/16/2009)   LDL: 112 (06/16/2009)   TG: 94.0 (06/16/2009)  Complete Medication List: 1)  Celebrex 200 Mg Caps (Celecoxib) 2)  Clorazepate Dipotassium 7.5 Mg Tabs (Clorazepate dipotassium) .Marland Kitchen.. 1 by mouth at bedtime 3)  Flonase 50  Mcg/act Susp (Fluticasone propionate) .Marland Kitchen.. 1 spray each nostril at bedtime 4)  Lisinopril 20 Mg Tabs (Lisinopril) .Marland Kitchen.. 1 by mouth q d  for high blood pressure 5)  Robaxin 500 Mg Tabs (Methocarbamol) 6)  Flexeril 10 Mg Tabs (Cyclobenzaprine hcl) 7)  Calcium Carbonate Powd (Calcium carbonate) 8)  Multivitamins Tabs (Multiple vitamin) 9)  Vitamin D 1000 Unit Tabs (Cholecalciferol) 10)  Aspirin 81 Mg Tabs (Aspirin) 11)  Fish Oil Oil (Fish oil) 12)  Miralax Powd (Polyethylene glycol 3350) 13)  Senokot S 8.6-50 Mg Tabs (Sennosides-docusate sodium) 14)  Tramadol Hcl 50 Mg Tabs (Tramadol hcl) 15)  Vicodin 5-500 Mg Tabs (Hydrocodone-acetaminophen) 16)  Zyflamend  17)  Omeprazole 20 Mg Cpdr (Omeprazole) .Marland Kitchen.. 1 by mouth  once daily  Patient Instructions: 1)  this does seem like  reflux   esophageal  disease. 2)   take prilosec  each day 30-60 minutes  before a meal. 3)   can add on pepcid or ranitidine in the meantime . 4)  avoid excess caffiene alcohol.  plenty of water with pills.  5)  Will   do Gi referral   about any further evaluation and or poss need  endoscopy.  6)  stop the ASA for now. Prescriptions: OMEPRAZOLE 20 MG CPDR (OMEPRAZOLE) 1 by mouth  once daily  #30 x 2   Entered and Authorized by:   Madelin Headings MD   Signed  by:   Madelin Headings MD on 02/18/2010   Method used:   Print then Give to Patient   RxID:   [id number]    Orders Added: 1)  Est. Patient Level IV [66440] 2)  Gastroenterology Referral [GI]

## 2010-03-18 NOTE — Letter (Signed)
Summary: New Patient letter  [facility] Gastroenterology  [address] [place], [place] [address]   Phone: [phone]  Fax: [phone]       02/19/2010 MRN: [id number]  [facility] [address] [place], [place]  [address]  Dear [name],  Welcome to the Gastroenterology Division at [facility].    You are scheduled to see Dr.  Christella Hartigan on 03-29-10 at 1:30pm on the 3rd floor at [facility], [address]  We ask that you try to arrive at our office 15 minutes prior to your appointment time to allow for check-in.  We would like you to complete the enclosed self-administered evaluation form prior to your visit and bring it with you on the day of your appointment.  We will review it with you.  Also, please bring a complete list of all your medications or, if you prefer, bring the medication bottles and we will list them.  Please bring your insurance card so that we may make a copy of it.  If your insurance requires a referral to see a specialist, please bring your referral form from your primary care physician.  Co-payments are due at the time of your visit and may be paid by cash, check or credit card.     Your office visit will consist of a consult with your physician (includes a physical exam), any laboratory testing he/she may order, scheduling of any necessary diagnostic testing (e.g. x-ray, ultrasound, CT-scan), and scheduling of a procedure (e.g. Endoscopy, Colonoscopy) if required.  Please allow enough time on your schedule to allow for any/all of these possibilities.    If you cannot keep your appointment, please call [phone] to cancel or reschedule prior to your appointment date.  This allows [place] the opportunity to schedule an appointment for another patient in need of care.  If you do not cancel or reschedule by 5 p.m. the business day prior to your appointment date, you will be charged a $50.00 late cancellation/no-show fee.    Thank you for choosing  [facility] for your medical needs.  We appreciate the opportunity to care for you.  Please visit [place] at our website  to learn more about our practice.                     Sincerely,                                                             The Gastroenterology Division

## 2010-03-18 NOTE — Progress Notes (Signed)
  Phone Note Call from Patient Call back at Work Phone [phone]   Caller: Patient Call For: Briana Headings MD Reason for Call: Acute Illness Complaint: Chest Pain Summary of Call: Left message to call her back regarding some chest pain.  Called back and left message to call us right back if she needs anything. Initial call taken by: Lynann Beaver CMA AAMA,  February 17, 2010 12:36 PM  Follow-up for Phone Call        Pt has had a few episodes of mid chest pain relieved with antiacids.  No SOB or cardiac complaints otherwise.  Does not want to be seen today.  Would like to come tomorrow.  Appt given and instuctions on when and why to go to the ER if pain increases or changes. Follow-up by: Lynann Beaver CMA AAMA,  February 17, 2010 2:12 PM  Additional Follow-up for Phone Call Additional follow up Details #1::        Pt called back and wants to know if Dr Fabian Sharp will work her in this afternoon for the chest pain.......she changed her mind? Additional Follow-up by: Lynann Beaver CMA AAMA,  February 17, 2010 2:17 PM    Additional Follow-up for Phone Call Additional follow up Details #2::    Per Dr. Fabian Sharp- If can clinically wait (ie no cardiac symptoms) keep appt with [place] for tomorrow. If can't wait and thinks it may be cardiac, needs to go to ED. Follow-up by: Romualdo Bolk, CMA Duncan Dull),  February 17, 2010 2:38 PM  Additional Follow-up for Phone Call Additional follow up Details #3:: Details for Additional Follow-up Action Taken: Pt has decided to go to an Urgent Care tonight possilbly and keep her appt with Dr. Fabian Sharp tomorrow. Additional Follow-up by: Lynann Beaver CMA AAMA,  February 17, 2010 2:50 PM

## 2010-04-13 ENCOUNTER — Ambulatory Visit: Payer: Self-pay | Admitting: Gastroenterology

## 2010-04-26 ENCOUNTER — Telehealth: Payer: Self-pay | Admitting: Internal Medicine

## 2010-04-26 NOTE — Telephone Encounter (Signed)
Pt would like a referral to see doc jacob asap due to gerd. Pt was told doc is out a couple of months.

## 2010-04-27 NOTE — Telephone Encounter (Signed)
Spoke to pt and she has made an appt but he only had one opening at 1:30pm. She was worried about running late for her next appt. They can only make appts one month at a time. I asked her if she asked to see the PA? She is going to call them and asked them if she can be worked in with the PA.

## 2010-04-27 NOTE — Telephone Encounter (Signed)
What does this message mean?

## 2010-04-29 ENCOUNTER — Telehealth: Payer: Self-pay | Admitting: Gastroenterology

## 2010-05-03 ENCOUNTER — Encounter: Payer: Self-pay | Admitting: Physician Assistant

## 2010-05-03 ENCOUNTER — Other Ambulatory Visit: Payer: Self-pay | Admitting: Internal Medicine

## 2010-05-03 ENCOUNTER — Other Ambulatory Visit: Payer: Medicare Other

## 2010-05-03 ENCOUNTER — Ambulatory Visit (INDEPENDENT_AMBULATORY_CARE_PROVIDER_SITE_OTHER): Payer: Medicare Other | Admitting: Physician Assistant

## 2010-05-03 ENCOUNTER — Other Ambulatory Visit: Payer: Self-pay | Admitting: Physician Assistant

## 2010-05-03 DIAGNOSIS — R142 Eructation: Secondary | ICD-10-CM

## 2010-05-03 DIAGNOSIS — K219 Gastro-esophageal reflux disease without esophagitis: Secondary | ICD-10-CM

## 2010-05-03 DIAGNOSIS — R1013 Epigastric pain: Secondary | ICD-10-CM | POA: Insufficient documentation

## 2010-05-03 DIAGNOSIS — R141 Gas pain: Secondary | ICD-10-CM | POA: Insufficient documentation

## 2010-05-03 DIAGNOSIS — R143 Flatulence: Secondary | ICD-10-CM

## 2010-05-03 LAB — CBC WITH DIFFERENTIAL/PLATELET
Basophils Absolute: 0 10*3/uL (ref 0.0–0.1)
Basophils Relative: 0.9 % (ref 0.0–3.0)
Eosinophils Absolute: 0.3 10*3/uL (ref 0.0–0.7)
Eosinophils Relative: 5.8 % — ABNORMAL HIGH (ref 0.0–5.0)
HCT: 39.2 % (ref 36.0–46.0)
Hemoglobin: 13.7 g/dL (ref 12.0–15.0)
Lymphocytes Relative: 23 % (ref 12.0–46.0)
Lymphs Abs: 1.1 10*3/uL (ref 0.7–4.0)
MCHC: 34.9 g/dL (ref 30.0–36.0)
Monocytes Absolute: 0.4 10*3/uL (ref 0.1–1.0)
Monocytes Relative: 8.6 % (ref 3.0–12.0)
Neutro Abs: 2.9 10*3/uL (ref 1.4–7.7)
Neutrophils Relative %: 61.7 % (ref 43.0–77.0)
Platelets: 212 10*3/uL (ref 150.0–400.0)
RBC: 4.14 Mil/uL (ref 3.87–5.11)
RDW: 13.2 % (ref 11.5–14.6)
WBC: 4.8 10*3/uL (ref 4.5–10.5)

## 2010-05-03 LAB — COMPREHENSIVE METABOLIC PANEL
ALT: 27 U/L (ref 0–35)
AST: 30 U/L (ref 0–37)
Albumin: 4.4 g/dL (ref 3.5–5.2)
Alkaline Phosphatase: 99 U/L (ref 39–117)
BUN: 29 mg/dL — ABNORMAL HIGH (ref 6–23)
CO2: 26 mEq/L (ref 19–32)
Creatinine, Ser: 1 mg/dL (ref 0.4–1.2)
GFR: 61.14 mL/min (ref >60.00)
Glucose, Bld: 79 mg/dL (ref 70–99)
Sodium: 139 mEq/L (ref 135–145)
Total Bilirubin: 0.3 mg/dL (ref 0.3–1.2)
Total Protein: 7 g/dL (ref 6.0–8.3)

## 2010-05-04 NOTE — Progress Notes (Signed)
Summary: Appt ?s   Phone Note Call from Patient Call back at Work Phone [phone]   Caller: Patient Call For: Dr. Christella Hartigan Reason for Call: Talk to Nurse Summary of Call: Patient had pain in chest and abdomen, has had a few episodes over several months but not currently having pain but 05/18/2010 does not work with her so Aurther Loft at her primary care said that she should call and request to see a PA, but she is going to be out of town untill march 27, this is not an emergency but since we dont have may open she thought she would try to get something with someone else, she will not be home today do calling her tomorrow would work out better for her Initial call taken by: [place] Johnson,  April 29, 2010 11:36 AM  Follow-up for Phone Call        Patient c/o chest pain for some time and she has discussed the problem with DrPanosh who feels as though it's acid reflux. Patient stated the pain is so severe at times she has to lie down. The pain resembled  Gall Bladder pain prior to her Cholecystectomy. She does admit she has tried Omeprazole with improvement of her s&s. Patient given an appointment with Mike Gip PAC on 05/03/10. Follow-up by: Graciella Freer RN,  April 30, 2010 10:21 AM

## 2010-05-06 ENCOUNTER — Other Ambulatory Visit (HOSPITAL_COMMUNITY): Payer: Medicare Other

## 2010-05-13 ENCOUNTER — Ambulatory Visit (HOSPITAL_COMMUNITY)
Admission: RE | Admit: 2010-05-13 | Discharge: 2010-05-13 | Disposition: A | Discharge status: Home / Self Care | Payer: Medicare Other | Source: Ambulatory Visit | Attending: Internal Medicine | Admitting: Internal Medicine

## 2010-05-13 DIAGNOSIS — R1013 Epigastric pain: Secondary | ICD-10-CM

## 2010-05-13 DIAGNOSIS — K219 Gastro-esophageal reflux disease without esophagitis: Secondary | ICD-10-CM | POA: Insufficient documentation

## 2010-05-13 DIAGNOSIS — Z9089 Acquired absence of other organs: Secondary | ICD-10-CM | POA: Insufficient documentation

## 2010-05-13 NOTE — Assessment & Plan Note (Signed)
Summary: [name], c/o chest pain, Dr Fabian Sharp thinks it's re...    History of Present Illness Visit Type: Initial Consult Primary GI MD: Rob Bunting MD Primary Provider: Berniece Andreas, MD Requesting Provider: Berniece Andreas, MD Chief Complaint: acid reflux x 2 months History of Present Illness:    This is a pleasant 65 year old white female referred by Dr. Fabian Sharp  with complaints of episodic gas and epigastric pain -question secondary to acid reflux. She has not been seen by GI in [place] but does report having a colonoscopy in 2004 or 2005 when she was living in [place]. Patient reports this was a normal exam.    She is status post cholecystectomy. She says she has had several episodes of severe gas-like pressure in her epigastrium occurring over the past year or so. The last episode was in January at which time she was seen at an urgent care. She had an EKG done there which was normal and was then seen by Dr. pale mesh and started on omeprazole 20 mg once daily. She has not had another episode since. She says she typically will go 3-4 months between these episodes but when they occur they are fairly intense and "stop her in her tracks". These episodes last for approximately an hour. She does  at times have radiation into her back, nausea and no vomiting. She does have a lot of associated belching and burping. In between these episodes she generally does not have any heartburn or indigestion very occasionally will have vague solid food this dysphagia. She does have chronic constipation which she has had for years and manages this with MiraLax as needed and Senokot once or twice per week. Patient states that these episodes remind her of her previous gallbladder symptoms.   GI Review of Systems     Location of  Abdominal pain: epigastric area.    Denies abdominal pain, acid reflux, belching, bloating, chest pain, dysphagia with liquids, dysphagia with solids, heartburn, loss of  appetite, nausea, vomiting, vomiting blood, weight loss, and  weight gain.      Reports constipation.     Denies anal fissure, black tarry stools, change in bowel habit, diarrhea, diverticulosis, fecal incontinence, heme positive stool, hemorrhoids, irritable bowel syndrome, jaundice, light color stool, liver problems, rectal bleeding, and  rectal pain.    Current Medications (verified): 1)  Celebrex 200 Mg Caps (Celecoxib) 2)  Clorazepate Dipotassium 7.5 Mg Tabs (Clorazepate Dipotassium) .Marland Kitchen.. 1 By Mouth At Bedtime 3)  Lisinopril 20 Mg Tabs (Lisinopril) .Marland Kitchen.. 1 By Mouth Q D  For High Blood Pressure 4)  Robaxin 500 Mg Tabs (Methocarbamol) 5)  Flexeril 10 Mg Tabs (Cyclobenzaprine Hcl) 6)  Calcium Carbonate   Powd (Calcium Carbonate) 7)  Multivitamins   Tabs (Multiple Vitamin) 8)  Vitamin D 1000 Unit  Tabs (Cholecalciferol) 9)  Aspirin 81 Mg  Tabs (Aspirin) 10)  Fish Oil   Oil (Fish Oil) 11)  Miralax  Powd (Polyethylene Glycol 3350) 12)  Senokot S 8.6-50 Mg Tabs (Sennosides-Docusate Sodium) 13)  Tramadol Hcl 50 Mg Tabs (Tramadol Hcl) 14)  Vicodin 5-500 Mg Tabs (Hydrocodone-Acetaminophen) 15)  Zyflamend 16)  Omeprazole 20 Mg Cpdr (Omeprazole) .Marland Kitchen.. 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 Rt Knee Arthroscopy Cholecystectomy r knee replacement Bunion surgery  Tonsillectomy foot surgery 10 2011  Family History: Reviewed history from 02/18/2010 and no changes required. mom  need av r ,  copd , ra, osteoporosis     90 yrs    died  after Open heart surgery  Older brother 46 had mi    Family History Hypertension Family History Lung cancer Father etoh no dm    Family History of Alcoholism/Addiction father  Twin brother to have back surgery  mom had esophageal stricture  treated.      Social History: no change Never Smoked hh of 2   pet cats   2  teaching retired  Engineer, technical sales Day Psychologist, occupational education sleep    erratic   some dairy.   Etoh 1-2 most nights    Daily Caffeine Use 3  Review of Systems       The patient complains of allergy/sinus, arthritis/joint pain, back pain, heart murmur, sleeping problems, and swelling of feet/legs.  The patient denies anemia, anxiety-new, blood in urine, breast changes/lumps, change in vision, confusion, cough, coughing up blood, depression-new, fainting, fatigue, fever, headaches-new, hearing problems, heart rhythm changes, itching, menstrual pain, muscle pains/cramps, night sweats, nosebleeds, pregnancy symptoms, shortness of breath, skin rash, sore throat, swollen lymph glands, thirst - excessive , urination - excessive , urination changes/pain, urine leakage, vision changes, and voice change.         see hpi  Vital Signs:  Patient profile:   65 year old female Menstrual status:  postmenopausal Height:      60.5 inches Weight:      130.13 pounds BMI:     25.09 Pulse rate:   72 / minute Pulse rhythm:   regular BP sitting:   102 / 68  (left arm) Cuff size:   regular  Vitals Entered By: June McMurray CMA Duncan Dull) (May 03, 2010 8:57 AM)  Physical Exam  General:  Well developed, well nourished, no acute distress. Head:  Normocephalic and atraumatic. Eyes:  PERRLA, no icterus. Lungs:  Clear throughout to auscultation. Heart:  Regular rate and rhythm; no murmurs, rubs,  or bruits. Abdomen:  soft, nontender, no mass or hsm,bs+ Rectal:  not done Extremities:  No clubbing, cyanosis, edema or deformities  noted. Neurologic:  Alert and  oriented x4;  grossly normal neurologically. Psych:  Alert and cooperative. Normal mood and affect.   Impression & Recommendations:  Problem # 1:  ABDOMINAL PAIN, EPIGASTRIC (ICD-789.06) Assessment Improved  65 year old female with sporadic epigastric pain pressure and gas radiating to the back and associated with nausea. Symptoms are very episodic , generally months in between. While her symptoms may be secondary to acid reflux the episodic and more intense nature suggest a biliary etiology. She is status post remote cholecystectomy -will rule out choledocholithiasis.   check labs as outlined below   schedule upper abdominal ultrasound   continue omeprazole 20 mg by mouth daily  Patient was advised to call us should she have any recurrence of her symptoms so that labs can be checked during or shortly after an episode. Orders: Ultrasound Abdomen (UAS) TLB-CMP (Comprehensive Metabolic Pnl) (80053-COMP) TLB-CBC Platelet - w/Differential (85025-[id number])  Problem # 2:  colon neoplasia screening Assessment: Comment Only  Last colonoscopy approximately 6  years ago will be due for followup 2015  Problem # 3:  HYPERTENSION (ICD-401.9) Assessment: Comment Only  Problem # 4:  OSTEOARTHRITIS (ICD-715.90) Assessment: Comment Only  Patient Instructions: 1)  Please go to lab, basement level. 2)  We have scheduled the Ultra [facility] for tomorrow 05-04-2010. 3)  Directions provided. 4)  We will send 4 refills on the Omeprazole to your pharmacy. 5)  Copy sent to : Berniece Andreas, MD 6)  The medication list was reviewed and reconciled.  All changed / newly prescribed medications were explained.  A complete medication list was provided to the patient / caregiver. Prescriptions: OMEPRAZOLE 20 MG CPDR (OMEPRAZOLE) 1 by mouth  once daily  #30 x 4   Entered by:   Lowry Ram NCMA   Authorized by:   Sammuel Cooper PA-c   Signed by:   Lowry Ram NCMA on 05/03/2010   Method  used:   Electronically to        [facility]  [phone]* (retail)       [address]       [place], [place]  [address]       Ph: [phone] or [phone]       Fax: [phone]   RxID:   [id number]

## 2010-05-17 ENCOUNTER — Telehealth: Payer: Self-pay

## 2010-05-17 DIAGNOSIS — R1013 Epigastric pain: Secondary | ICD-10-CM

## 2010-05-17 NOTE — Telephone Encounter (Signed)
Message copied by Chales Abrahams on Mon May 17, 2010 11:22 AM ------      Message from: Rob Bunting      Created: Fri May 14, 2010  8:14 AM       Danylah Holden, please callher.  [place] was normal.  Would like to proceed with EGD at [facility] to investigate her epigastric abd pains.

## 2010-05-17 NOTE — Telephone Encounter (Signed)
Pt scheduled for EGD 06/16/10 830 LEB CT  Pt has been instructed and meds reviewed instructions also mailed to the home

## 2010-05-18 ENCOUNTER — Ambulatory Visit: Payer: Self-pay | Admitting: Gastroenterology

## 2010-06-01 ENCOUNTER — Telehealth: Payer: Self-pay | Admitting: *Deleted

## 2010-06-01 NOTE — Telephone Encounter (Signed)
Pt. States she lost her prescription for Tranxene and needs approval from Dr. Fabian Sharp to refill it. Nicolette Bang Surveyor, minerals)

## 2010-06-01 NOTE — Telephone Encounter (Signed)
Per Dr. Fabian Sharp ok to refill

## 2010-06-02 NOTE — Telephone Encounter (Signed)
I called the pharmacy and told them that it would be okay to refill this rx early. Spoke to spouse to let him know that we called [facility] about this.

## 2010-06-15 ENCOUNTER — Encounter: Payer: Self-pay | Admitting: Gastroenterology

## 2010-06-16 ENCOUNTER — Encounter: Payer: Self-pay | Admitting: Gastroenterology

## 2010-06-16 ENCOUNTER — Ambulatory Visit (AMBULATORY_SURGERY_CENTER): Payer: Medicare Other | Admitting: Gastroenterology

## 2010-06-16 VITALS — BP 119/88 | HR 70 | Temp 97.4°F | Resp 14 | Ht 61.0 in | Wt 127.0 lb

## 2010-06-16 DIAGNOSIS — D131 Benign neoplasm of stomach: Secondary | ICD-10-CM

## 2010-06-16 DIAGNOSIS — K2990 Gastroduodenitis, unspecified, without bleeding: Secondary | ICD-10-CM

## 2010-06-16 DIAGNOSIS — K2970 Gastritis, unspecified, without bleeding: Secondary | ICD-10-CM

## 2010-06-16 DIAGNOSIS — R109 Unspecified abdominal pain: Secondary | ICD-10-CM

## 2010-06-16 DIAGNOSIS — R1013 Epigastric pain: Secondary | ICD-10-CM

## 2010-06-16 MED ORDER — SODIUM CHLORIDE 0.9 % IV SOLN: 500.0000 mL | INTRAVENOUS | Status: DC

## 2010-06-16 NOTE — Patient Instructions (Signed)
Please read the handout given to you by your recovery room nurse.  Stay on your omeprazole to counteract the celebrex irritation.  We also biopsied your stomach to see if you have an infection called H-pylori.  We will call your son if the test is positive, and we will call in antibiotics for you.     For our records, your son's number is [phone] and his name is Jesusita Oka.   Please call us if you have any questions or concerns at [phone].  Thank-you.

## 2010-06-16 NOTE — Progress Notes (Signed)
Please call biopsy results to the patient's son as she will be traveling in [place].  His name is Dan and his number is [phone]. Thank-you. 

## 2010-06-16 NOTE — Progress Notes (Signed)
Please call biopsy results to the patient's son as she will be traveling in [place].  His name is Jesusita Oka and his number is [phone]. Thank-you.

## 2010-06-17 ENCOUNTER — Telehealth: Payer: Self-pay | Admitting: *Deleted

## 2010-06-17 NOTE — Telephone Encounter (Signed)

## 2010-06-18 ENCOUNTER — Encounter: Payer: Self-pay | Admitting: *Deleted

## 2010-06-18 NOTE — Telephone Encounter (Signed)
Encounter created in error

## 2010-06-24 ENCOUNTER — Telehealth: Payer: Self-pay | Admitting: Gastroenterology

## 2010-06-25 NOTE — Telephone Encounter (Signed)
Notified pt's son, Reuel Boom that gastric biopsy : benign mucosa, no evidence of H.Pylori, intestinal hyperplasia, dysplasia or malignancy. Son stated understanding; pt is in [place].

## 2010-06-28 ENCOUNTER — Ambulatory Visit: Payer: Self-pay | Admitting: Internal Medicine

## 2010-06-29 NOTE — Assessment & Plan Note (Signed)
OFFICE VISIT   MELENIE, MINNIEAR [facility]  DOB:  11/02/1945                                       12/05/2007  [id number]#:[id number]   The patient presents today for excision of a mass in her right calf.  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  She did not have any evidence of varicosities around  this.   PROCEDURE:  Under sterile Betadine prep and drape, and under 1%  lidocaine local anesthesia incision was made over the mass.  This was  excised in its entirety by delivering it to the skin and then avulsing  with hemostats.  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  Hemostasis was obtained with pressure.  Steri-Strip was applied and the  patient was discharged after being placed in knee-high compression  garments and instructed on their use.  She will be seen again in 2 weeks  for followup.  Specimen was sent for pathology.   Larina Earthly, M.D.  Electronically Signed   TFE/MEDQ  D:  12/05/2007  T:  12/06/2007  Job:  [id number]

## 2010-06-29 NOTE — Consult Note (Signed)
NEW PATIENT CONSULTATION   Briana Giles, Briana Giles [facility]  DOB:  04/17/1945                                       11/21/2007  [id number]#:[id number]   The patient presents today for evaluation of a mass in her right calf.  She reports that this has been present for many years.  She does have  some swelling over this area and some mild discomfort.  She did have a  knee replacement 2 years ago and still has some discomfort related to  this as well.  It is occasionally tender to touch.  She does not  remember any trauma to this area and does not recall whether this was  ever less firm.  She does not remember any specific erythema regarding  this.   PAST MEDICAL HISTORY:  Significant for hypertension.  She is not a  diabetic.   SOCIAL HISTORY:  She is married with two children.  She works as a  Runner, broadcasting/film/video.  She does not smoke and has one to two alcohol drinks per day.   REVIEW OF SYSTEMS:  VITAL SIGNS:  Her weight is reported 122 pounds.  She is 5 feet 1 inch tall.  CARDIAC:  Is noted for a heart murmur.  She does have arthritic joint pain.   MEDICATIONS:  Darvocet, Celebrex, Nasonex, calcium, multivitamins and  occasional MiraLax.   PHYSICAL EXAMINATION:  General:  A well-developed, well-nourished thin  white female in no acute distress.  Vital signs:  Blood pressure is  130/76, pulse 66, respirations 18.  Her right dorsalis pedis pulse is  2+.  She does have a 1-2 cm firm nodule on the medial right calf.  There  is a bluish discoloration related to this.  It is firm.   She underwent venous duplex by me and this reveals her saphenous vein  branches around this area and there is a small tributary branch that is  associated with this area.  This is not compressible.  I discussed this  with the patient.  I am unclear as to what this is.  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  I have recommended excision of this for  symptom relief and  also for pathology.  We will schedule this at her convenience as an  outpatient in our office.   Larina Earthly, M.D.  Electronically Signed   TFE/MEDQ  D:  11/21/2007  T:  11/22/2007  Job:  [id number]   cc:   Neta Mends. Fabian Sharp, MD  Pollyann Savoy, M.D.

## 2010-06-29 NOTE — Assessment & Plan Note (Signed)
OFFICE VISIT   Briana Giles, Briana Giles [facility]  DOB:  12/27/1945                                       12/21/2007  [id number]#:[id number]   The patient presents today for follow-up after excision of a venous mass  in her right calf on 12/05/2007, this is completely healed.  She has a  small, less than 1/4-inch, incision with no surrounding bruising or  discomfort.  Her pathology showed a hemangioma which I explained to the  patient is simply a small venous mass under this.  She does not have any  other evidence of venous pathology and will follow up with [place] on an as-  needed basis.  She is pleased with her result, as am I.   Larina Earthly, M.D.  Electronically Signed   TFE/MEDQ  D:  12/21/2007  T:  12/24/2007  Job:  [id number]   cc:   Neta Mends. Fabian Sharp, MD

## 2010-07-21 ENCOUNTER — Telehealth: Payer: Self-pay | Admitting: *Deleted

## 2010-07-21 MED ORDER — CLORAZEPATE DIPOTASSIUM 7.5 MG PO TABS: 7.5000 mg | ORAL_TABLET | Freq: Every day | ORAL | Status: DC

## 2010-07-21 NOTE — Telephone Encounter (Signed)
Refill on cloraz dipot 7.5mg 

## 2010-07-22 ENCOUNTER — Encounter: Payer: Self-pay | Admitting: Internal Medicine

## 2010-07-22 ENCOUNTER — Ambulatory Visit (INDEPENDENT_AMBULATORY_CARE_PROVIDER_SITE_OTHER): Payer: Medicare Other | Admitting: Internal Medicine

## 2010-07-22 VITALS — BP 120/80 | HR 66 | Wt 130.0 lb

## 2010-07-22 DIAGNOSIS — M79672 Pain in left foot: Secondary | ICD-10-CM | POA: Insufficient documentation

## 2010-07-22 DIAGNOSIS — R1013 Epigastric pain: Secondary | ICD-10-CM

## 2010-07-22 DIAGNOSIS — K219 Gastro-esophageal reflux disease without esophagitis: Secondary | ICD-10-CM

## 2010-07-22 DIAGNOSIS — M79609 Pain in unspecified limb: Secondary | ICD-10-CM

## 2010-07-22 NOTE — Patient Instructions (Addendum)
Will do a referral to Dr Lajoyce Corners.  Get copy of records related  to foot and surgery note for him to review.  Celebrex is probably less irritating to stomach than naproxyn and wouldn't want to take this with ASA

## 2010-07-22 NOTE — Progress Notes (Signed)
Subjective:    Patient ID: Briana Giles, female    DOB: 02/11/1946, 65 y.o.   MRN: [id number]  HPI Patient comes in for SDA  For above problem.  She underwent October foot surgery bunion and little toe in October. Her bunion is somewhat better her foot does still have some swelling but she has continued discomfort sharp and shooting pains into  2 middle 2 toes   .   Dr.  Charlsie Merles  Did surgery.  She underwent PT because of the stiffness in her toe joint which helps, but she still has continued problems that include pain when she walks radiating from the bottom of her foot. Dr. Charlsie Merles said it could be a vascular problem. She has no history of artery disease or damage. But her foot predicted man had become worse after she had a knee problem.  She needs advice about where to go next Because this is an ongoing problem and pain. She has a history of having seen another foot surgeon in town but he was very busy and she did not think she got  enough attention to her foot.  Other issues Need s refill of chorazepate  as is going out of town to [place]. She uses it every night for sleep GI: Had endo   And was ok.   No major problems but does seem to respond to the acid blockers. Asks about naproxen versus Celebrex. Review of Systems Negative for chest pain shortness of breath other areas of numbness weakness claudication.  Past Medical History  Diagnosis Date  . Allergic rhinitis   . GERD (gastroesophageal reflux disease)   . Osteoarthritis   . Heart murmur   . Cervical back pain with evidence of disc disease   . Nerve damage     left arm  . Transient global amnesia   . Abnormal LFTs   . Phlebitis   . Spinal stenosis   . Osteopenia   . Hypertension   . Abdominal  pain, other specified site    Past Surgical History  Procedure Date  . Knee arthroscopy     right  . Cholecystectomy   . Total knee arthroplasty     right  . Bunionectomy     fall 2022 [facility]  . Tonsillectomy   . Foot  surgery   . Replacement unicondylar joint knee     rt knee    reports that she has never smoked. She does not have any smokeless tobacco history on file. She reports that she drinks about 8.4 ounces of alcohol per week. Her drug history not on file. family history includes Alcohol abuse in an unspecified family member; COPD in an unspecified family member; Heart disease in an unspecified family member; Hypertension in an unspecified family member; Lung cancer in her father and unspecified family member; and Osteoporosis in an unspecified family member. Allergies  Allergen Reactions  . Codeine Phosphate     REACTION: unspecified       Objective:   Physical Exam Well-developed well-nourished in no acute distress. Examination of her feet right appears normal left shows healed bunion scar with some mild swelling over the top of her foot.  No ulcers but there is redness on the left little toe. She has some tenderness at the metatarsal heads on the bottom. Her pulses seem intact capillary refill is normal. Gait is mildly antalgic. Calf is normal and there is no swelling of her knee joints.     Assessment & Plan:  Left foot pain toes  sounds nerve related. I don't see evidence of a vascular problem however she could have some arterial spasm at times or possibly from neuralgia. Was told that she does not have a Morton's neuroma. History of foot surgery. Some of this problem predated the bunion surgery but it is certainly continuing and possibly worse. I suggest she see an orthopedist has experienced with extremity evaluation.  We will refer her to Dr. Lajoyce Corners to and she will get the records including operative note to go with her  Sleep anxiety the patient refill her medication GI   Celebrex would be safer than naproxen especially since she is on aspirin. She is due for a wellness exam in the next 6 months and she can make an appointment for this call for refills of medicines in the meantime.

## 2010-08-23 ENCOUNTER — Other Ambulatory Visit: Payer: Self-pay | Admitting: Internal Medicine

## 2010-08-23 NOTE — Telephone Encounter (Signed)
Last OV 07/22/10 Nov 10/25/10 Please advise

## 2010-08-24 ENCOUNTER — Telehealth: Payer: Self-pay | Admitting: *Deleted

## 2010-08-24 NOTE — Telephone Encounter (Signed)
ok to refill the lisinopril 90 days x 3 Chlorazepate  30 x 3

## 2010-08-24 NOTE — Telephone Encounter (Signed)
error 

## 2010-08-24 NOTE — Telephone Encounter (Signed)
Rx sent to pharmacy   

## 2010-10-25 ENCOUNTER — Ambulatory Visit (INDEPENDENT_AMBULATORY_CARE_PROVIDER_SITE_OTHER): Payer: Medicare Other | Admitting: Internal Medicine

## 2010-10-25 ENCOUNTER — Encounter: Payer: Self-pay | Admitting: Internal Medicine

## 2010-10-25 VITALS — BP 120/84 | HR 60 | Wt 129.0 lb

## 2010-10-25 DIAGNOSIS — R1013 Epigastric pain: Secondary | ICD-10-CM

## 2010-10-25 DIAGNOSIS — I10 Essential (primary) hypertension: Secondary | ICD-10-CM

## 2010-10-25 DIAGNOSIS — Z23 Encounter for immunization: Secondary | ICD-10-CM

## 2010-10-25 DIAGNOSIS — M1990 Unspecified osteoarthritis, unspecified site: Secondary | ICD-10-CM

## 2010-10-25 DIAGNOSIS — G479 Sleep disorder, unspecified: Secondary | ICD-10-CM

## 2010-10-25 MED ORDER — CLORAZEPATE DIPOTASSIUM 7.5 MG PO TABS: 7.5000 mg | ORAL_TABLET | Freq: Every day | ORAL | Status: DC

## 2010-10-25 NOTE — Patient Instructions (Addendum)
Will arrange for Gi to given opinion again about your  abd pain. Have Dr D send [place] copy of labs  ( and notes)  Copy to [place].  Plan check up this fall or winter

## 2010-10-25 NOTE — Progress Notes (Signed)
Subjective:    Patient ID: Briana Giles, female    DOB: Mar 15, 1945, 65 y.o.   MRN: [id number]  HPI  Patient comes in today for follow up of  multiple medical problems.  See Above  Since last visit : Hot flushes off and on with activity and at night.  Intense high epigastic pain.  interrupted     Still problematic  Unclear context and cause . No vomiting and no fever or weight loss . Had in initial evaluation but still ongoing  ? Probiotic helps. Off the celebrex regularly  and now taking in am and  Late pm as needed.  Review of Systems No fever weight loss.  Bleeding vision change  bp readings are nl    Past history family history social history reviewed in the electronic medical record.     Objective:   Physical Exam  WDWN nad  Neck no masses Chest:  Clear to A&P without wheezes rales or rhonchi CV:  S1-S2 no gallops or murmurs peripheral perfusion is normal Abdomen:  Sof,t normal bowel sounds without hepatosplenomegaly, no guarding rebound or masses no CVA tenderness Points to upper  epigastrium and to the left  No clubbing cyanosis or edema Skin: normal capillary refill ,turgor , color: No acute rashes ,petechiae or bruising Neuro seems grossly intact.       Assessment & Plan:  GI  Continuing with episodic pain and worse recently .   ? If spasm  Or other    Hot flushes disc hrt and poss of se of cohash and doesn't  work that well  .   DJD  Stopped celebrex cause of bump in cr  Per Dr D    HT  Stable  Sleep continuing meds    Risk benefit of medication discussed. Avoid combining sedating cns meds

## 2010-11-19 ENCOUNTER — Other Ambulatory Visit: Payer: Self-pay | Admitting: Physician Assistant

## 2010-11-29 ENCOUNTER — Ambulatory Visit (INDEPENDENT_AMBULATORY_CARE_PROVIDER_SITE_OTHER): Payer: Medicare Other | Admitting: Gastroenterology

## 2010-11-29 ENCOUNTER — Encounter: Payer: Self-pay | Admitting: Gastroenterology

## 2010-11-29 VITALS — BP 98/60 | HR 72 | Ht 61.5 in | Wt 130.8 lb

## 2010-11-29 DIAGNOSIS — K219 Gastro-esophageal reflux disease without esophagitis: Secondary | ICD-10-CM

## 2010-11-29 DIAGNOSIS — K5900 Constipation, unspecified: Secondary | ICD-10-CM

## 2010-11-29 MED ORDER — LUBIPROSTONE 8 MCG PO CAPS: 8.0000 ug | ORAL_CAPSULE | Freq: Two times a day (BID) | ORAL | Status: AC

## 2010-11-29 NOTE — Progress Notes (Signed)
Review of pertinent gastrointestinal problems: 1. Routine risk for colon cancer, colon[place], next colonoscoyp 2015 2. cholecystecotomy 3. Intermittent abd pains: 3/12 [place] normal, LFTs normal; EGD 5/12 mild gastritis, H .pylori negative on biopsy.  PPI helped    HPI: This is a   very pleasant 65 year old woman whom I last saw him 5 months ago  She has been on omeprazole daily.  She wasn't taking it correctly, but changed 4-6 weeks ago.. Now taking it 20-30 min before BF meal.  She "thinks it is helping."  She also cut back on her tramadol because she noticed it may be be correlating with her intermittent pains (this was 4 weeks ago).  She was having pains several times per week, but after these changes she has not had any GI problems.    Without senekot, she will have a BM very infrequently (up to 2 weeks).  Has been taking senekot for 4-5 years.  Fiber didn't really help either.     Past Medical History:   Allergic rhinitis                                            GERD (gastroesophageal reflux disease)                       Osteoarthritis                                               Heart murmur                                                 Cervical back pain with evidence of disc disea*              Nerve damage                                                   Comment:left arm   Transient global amnesia                                     Abnormal LFTs                                                Phlebitis                                                    Spinal stenosis  Osteopenia                                                   Hypertension                                                 Abdominal  pain, other specified site                       Past Surgical History:   KNEE ARTHROSCOPY                                               Comment:right   CHOLECYSTECTOMY                                              TOTAL KNEE  ARTHROPLASTY                                        Comment:right   BUNIONECTOMY                                                   Comment:fall 2022 [facility]   TONSILLECTOMY                                                FOOT SURGERY                                                 REPLACEMENT UNICONDYLAR JOINT KNEE                             Comment:rt knee   reports that she has never smoked. She has never used smokeless tobacco. She reports that she drinks about 8.4 ounces of alcohol per week. Her drug history not on file.  family history includes Alcohol abuse in her father; COPD in her mother; Cancer in her maternal grandmother; Heart disease in her mother; Hypertension in her brother and father; Lung cancer in her father; Osteoporosis in her mother; and Prostate cancer in her maternal grandfather.    Current medicines and allergies were reviewed in [facility]    Physical Exam: BP 98/60  Pulse 72  Ht 5' 1.5" (1.562 m)  Wt 130 lb 12.8 oz (59.33 kg)  BMI 24.31 kg/m2 Constitutional: generally well-appearing Psychiatric: alert and oriented x3 Abdomen: soft, nontender, nondistended, no obvious ascites, no peritoneal signs, normal  bowel sounds     Assessment and plan: 65 y.o. female with improved intermittent abdominal pains, chronic constipation unchanged  Trial of amitiza at 8 mcg twice daily. She will call in 6 weeks to report on her symptoms. Her intermittent abdominal pains have improved since she started regular proton pump inhibitor and she is avoiding tramadol.

## 2010-11-29 NOTE — Progress Notes (Deleted)
Review of pertinent gastrointestinal problems: 1. routine risk for colon cancer, [place], next at 10 year interval 2015 2. Cholecystectomy 3. intermittent abdominal pains March 2012: Ultrasound showed no retained stones in bile duct, no biliary dilation, liver tests were normal. EGD May 2012 showed mild gastritis. Biopsies showed no H. pylori.

## 2010-11-29 NOTE — Patient Instructions (Signed)
Trial of amitiza. Call Dr. Christella Hartigan office in 6 weeks to report on your symptoms. Stay on omeprazole once a day.

## 2010-12-21 ENCOUNTER — Ambulatory Visit (INDEPENDENT_AMBULATORY_CARE_PROVIDER_SITE_OTHER): Payer: Medicare Other | Admitting: Internal Medicine

## 2010-12-21 ENCOUNTER — Encounter: Payer: Self-pay | Admitting: Internal Medicine

## 2010-12-21 VITALS — BP 122/74 | HR 97 | Temp 97.8°F | Ht 60.0 in | Wt 128.0 lb

## 2010-12-21 DIAGNOSIS — Z0000 Encounter for general adult medical examination without abnormal findings: Secondary | ICD-10-CM

## 2010-12-21 DIAGNOSIS — Z79899 Other long term (current) drug therapy: Secondary | ICD-10-CM

## 2010-12-21 DIAGNOSIS — J309 Allergic rhinitis, unspecified: Secondary | ICD-10-CM

## 2010-12-21 DIAGNOSIS — I10 Essential (primary) hypertension: Secondary | ICD-10-CM

## 2010-12-21 DIAGNOSIS — Z23 Encounter for immunization: Secondary | ICD-10-CM

## 2010-12-21 DIAGNOSIS — I8390 Asymptomatic varicose veins of unspecified lower extremity: Secondary | ICD-10-CM

## 2010-12-21 DIAGNOSIS — K219 Gastro-esophageal reflux disease without esophagitis: Secondary | ICD-10-CM

## 2010-12-21 DIAGNOSIS — M1990 Unspecified osteoarthritis, unspecified site: Secondary | ICD-10-CM

## 2010-12-21 DIAGNOSIS — Z13220 Encounter for screening for lipoid disorders: Secondary | ICD-10-CM

## 2010-12-21 DIAGNOSIS — G479 Sleep disorder, unspecified: Secondary | ICD-10-CM

## 2010-12-21 LAB — CBC WITH DIFFERENTIAL/PLATELET
Basophils Absolute: 0.1 10*3/uL (ref 0.0–0.1)
Hemoglobin: 14.1 g/dL (ref 12.0–15.0)
Lymphocytes Relative: 26.9 % (ref 12.0–46.0)
Monocytes Relative: 7.4 % (ref 3.0–12.0)
Neutrophils Relative %: 59.7 % (ref 43.0–77.0)
Platelets: 211 10*3/uL (ref 150.0–400.0)
RDW: 14 % (ref 11.5–14.6)

## 2010-12-21 LAB — BASIC METABOLIC PANEL
BUN: 20 mg/dL (ref 6–23)
Calcium: 9.4 mg/dL (ref 8.4–10.5)
Creatinine, Ser: 0.8 mg/dL (ref 0.4–1.2)
GFR: 78.47 mL/min (ref >60.00)
Glucose, Bld: 88 mg/dL (ref 70–99)
Sodium: 142 mEq/L (ref 135–145)

## 2010-12-21 LAB — LIPID PANEL
HDL: 69.5 mg/dL (ref >39.00)
Total CHOL/HDL Ratio: 3
VLDL: 13.6 mg/dL (ref 0.0–40.0)

## 2010-12-21 LAB — LDL CHOLESTEROL, DIRECT: Direct LDL: 141.9 mg/dL

## 2010-12-21 LAB — HEPATIC FUNCTION PANEL
AST: 26 U/L (ref 0–37)
Alkaline Phosphatase: 88 U/L (ref 39–117)
Bilirubin, Direct: 0 mg/dL (ref 0.0–0.3)
Total Bilirubin: 0.6 mg/dL (ref 0.3–1.2)

## 2010-12-21 NOTE — Patient Instructions (Addendum)
Get mammo gram and eye exam Will notify you  of labs when available. Then plan follow up. Or 6 months  Pap due in 2013 or 2014

## 2010-12-21 NOTE — Progress Notes (Signed)
Subjective:    Patient ID: Briana Giles, female    DOB: [birth year], 65 y.o.   MRN: [id number]  HPI Patient comes in today for preventive visit and follow-up of medical issues. Update of her history since her last visit.  Annual Medicare Wellness Visit. No major change in health status since last visit . Here is update  GI:  Tramadol was contributing.  So not taking  Had neg hpylori    Hx of abnromal lfts and off celebrex   ? Renal    Needs repeat   Joints still hurt but able to exercise  Stress some better still taking sleep meds   Hearing:  Ok   Vision:  No limitations at present . Glasses  Last check [place]   Safety:  Has smoke detector and wears seat belts.  No firearms. No excess sun exposure. Sees dentist regularly.  Falls:  No    Advance directive :  Reviewed  Has one.  Memory: Felt to be good  , no concern from her or her family.  Depression: No anhedonia unusual crying or depressive symptoms  Nutrition: Eats well balanced diet; adequate calcium and vitamin D. No swallowing chewiing problems.  Injury: no major injuries in the last six months.  Other healthcare providers:  Reviewed today .  Social:  Lives with husband married.  2  2 cats .   Preventive parameters: up-to-date on colonoscopy, mammogram, immunizations. Including Tdap and pneumovax. A few years ago     ADLS:   There are no problems or need for assistance  driving, feeding, obtaining food, dressing, toileting and bathing, managing money using phone. She is independent.     Review of Systems ROS:  GEN/ HEENTNo fever, significant weight changes sweats headaches vision problems hearing changes, CV/ PULM; No chest pain shortness of breath cough, syncope,edema  change in exercise tolerance. GI /GU: No adominal pain, vomiting, change in bowel habits. No blood in the stool. No significant GU symptoms. SKIN/HEME: ,no acute skin rashes suspicious lesions or bleeding. No lymphadenopathy,  nodules, masses.  NEURO/ PSYCH:  No neurologic signs such as weakness numbness No depression anxiety. IMM/ Allergy: No unusual infections.  Allergy .   REST of 12 system review negative  MS back and foot  Water exercising  Past history family history social history reviewed in the electronic medical record.     Objective:   Physical Exam Physical Exam: Vital signs reviewed JWJ:XBJY is a well-developed well-nourished alert cooperative  white female who appears her stated age in no acute distress.  HEENT: normocephalic  traumatic , Eyes: PERRL EOM's full, conjunctiva clear, Nares: paten,t no deformity discharge or tenderness., Ears: no deformity EAC's clear TMs with normal landmarks. Mouth: clear OP, no lesions, edema.  Moist mucous membranes. Dentition in adequate repair. NECK: supple without masses, thyromegaly or bruits. CHEST/PULM:  Clear to auscultation and percussion breath sounds equal no wheeze , rales or rhonchi. No chest wall deformities or tenderness. Breast: normal by inspection . No dimpling, discharge, masses, tenderness or discharge . LN: no cervical axillary inguinal adenopathy CV: PMI is nondisplaced, S1 S2 no gallops, murmurs, rubs. Peripheral pulses are full without delay.No JVD .  ABDOMEN: Bowel sounds normal nontender  No guard or rebound, no hepato splenomegal no CVA tenderness.  No hernia. Extremtities:  No clubbing cyanosis or edema, no acute joint swelling or redness no focal atrophy  Scar right knee and left foot some oslighe edema   No ulcers or scaring  Some vv   oa changes  NEURO:  Oriented x3, cranial nerves 3-12 appear to be intact, no obvious focal weakness,gait within normal limits no abnormal reflexes or asymmetrical SKIN: No acute rashes normal turgor, color, no bruising or petechiae. PSYCH: Oriented, good eye contact, no obvious depression anxiety, cognition and judgment appear normal.        Assessment & Plan:  Preventive Health Care Counseled  regarding healthy nutrition, exercise, sleep, injury prevention, calcium vit d and healthy weight . Continue water exercises  DJD: Large joints   And back    Continuing exercise and balance practice  HT stable  Liver  test  Renal ? Hx of abnormal and need to repeat today Sleep   Discussed   Medicare Attestation I have personally reviewed: The patient's medical and social history Their use of alcohol, tobacco or illicit drugs Their current medications and supplements The patient's functional ability including ADLs,fall risks, home safety risks, cognitive, and hearing and visual impairment Diet and physical activities Evidence for depression or mood disorders  The patient's weight, height, BMI, and visual acuity have been recorded in the chart.  I have made referrals, counseling, and provided education to the patient based on review of the above and I have provided the patient with a written personalized care plan for preventive services.

## 2010-12-22 ENCOUNTER — Encounter: Payer: Self-pay | Admitting: *Deleted

## 2011-01-10 ENCOUNTER — Telehealth: Payer: Self-pay | Admitting: Internal Medicine

## 2011-01-10 NOTE — Telephone Encounter (Signed)
Left message to call back We mailed her a letter on 12/22/10- labs were normal. She needs a follow up in 6 months.

## 2011-01-10 NOTE — Telephone Encounter (Signed)
Pt requesting results of lab work.

## 2011-01-11 NOTE — Telephone Encounter (Signed)
Pt aware of results 

## 2011-01-14 ENCOUNTER — Other Ambulatory Visit: Payer: Self-pay | Admitting: Internal Medicine

## 2011-01-14 DIAGNOSIS — Z1231 Encounter for screening mammogram for malignant neoplasm of breast: Secondary | ICD-10-CM

## 2011-02-03 ENCOUNTER — Other Ambulatory Visit: Payer: Self-pay | Admitting: Internal Medicine

## 2011-02-03 NOTE — Telephone Encounter (Signed)
Script called in

## 2011-02-03 NOTE — Telephone Encounter (Signed)
Call in #30 with no rf  

## 2011-02-23 ENCOUNTER — Ambulatory Visit
Admission: RE | Admit: 2011-02-23 | Discharge: 2011-02-23 | Disposition: A | Discharge status: Home / Self Care | Payer: Medicare Other | Source: Ambulatory Visit | Attending: Internal Medicine | Admitting: Internal Medicine

## 2011-02-23 DIAGNOSIS — Z1231 Encounter for screening mammogram for malignant neoplasm of breast: Secondary | ICD-10-CM

## 2011-02-26 ENCOUNTER — Other Ambulatory Visit: Payer: Self-pay | Admitting: Family Medicine

## 2011-03-01 ENCOUNTER — Other Ambulatory Visit: Payer: Self-pay | Admitting: Internal Medicine

## 2011-03-01 DIAGNOSIS — R928 Other abnormal and inconclusive findings on diagnostic imaging of breast: Secondary | ICD-10-CM

## 2011-03-02 NOTE — Telephone Encounter (Signed)
Rx called in 

## 2011-03-02 NOTE — Telephone Encounter (Signed)
Call in one month supply only

## 2011-03-14 ENCOUNTER — Ambulatory Visit
Admission: RE | Admit: 2011-03-14 | Discharge: 2011-03-14 | Disposition: A | Discharge status: Home / Self Care | Payer: Medicare Other | Source: Ambulatory Visit | Attending: Internal Medicine | Admitting: Internal Medicine

## 2011-03-14 DIAGNOSIS — R928 Other abnormal and inconclusive findings on diagnostic imaging of breast: Secondary | ICD-10-CM

## 2011-04-12 ENCOUNTER — Other Ambulatory Visit: Payer: Self-pay | Admitting: Family Medicine

## 2011-04-13 NOTE — Telephone Encounter (Signed)
Ok x 3  

## 2011-04-15 ENCOUNTER — Telehealth: Payer: Self-pay | Admitting: *Deleted

## 2011-04-15 DIAGNOSIS — E875 Hyperkalemia: Secondary | ICD-10-CM

## 2011-04-15 NOTE — Telephone Encounter (Signed)
Pt needs to schedule a lab appt and then a rov if labs are abnormal. If normal then pt can keep appt for 5/6. Pt aware of this.

## 2011-04-18 ENCOUNTER — Other Ambulatory Visit (INDEPENDENT_AMBULATORY_CARE_PROVIDER_SITE_OTHER): Payer: Medicare Other

## 2011-04-18 DIAGNOSIS — E875 Hyperkalemia: Secondary | ICD-10-CM

## 2011-04-18 LAB — BASIC METABOLIC PANEL
CO2: 29 mEq/L (ref 19–32)
Calcium: 9.6 mg/dL (ref 8.4–10.5)
Creatinine, Ser: 0.8 mg/dL (ref 0.4–1.2)
GFR: 71.97 mL/min (ref >60.00)
Sodium: 138 mEq/L (ref 135–145)

## 2011-04-19 ENCOUNTER — Encounter: Payer: Self-pay | Admitting: *Deleted

## 2011-04-19 NOTE — Progress Notes (Signed)
Quick Note:    Letter sent to pt.  ______

## 2011-06-20 ENCOUNTER — Encounter: Payer: Self-pay | Admitting: Internal Medicine

## 2011-06-20 ENCOUNTER — Ambulatory Visit (INDEPENDENT_AMBULATORY_CARE_PROVIDER_SITE_OTHER): Payer: Medicare Other | Admitting: Internal Medicine

## 2011-06-20 VITALS — BP 112/72 | HR 71 | Temp 98.2°F | Wt 133.0 lb

## 2011-06-20 DIAGNOSIS — I10 Essential (primary) hypertension: Secondary | ICD-10-CM

## 2011-06-20 DIAGNOSIS — M1990 Unspecified osteoarthritis, unspecified site: Secondary | ICD-10-CM

## 2011-06-20 DIAGNOSIS — G479 Sleep disorder, unspecified: Secondary | ICD-10-CM

## 2011-06-20 DIAGNOSIS — G478 Other sleep disorders: Secondary | ICD-10-CM

## 2011-06-20 DIAGNOSIS — Z79899 Other long term (current) drug therapy: Secondary | ICD-10-CM

## 2011-06-20 DIAGNOSIS — J309 Allergic rhinitis, unspecified: Secondary | ICD-10-CM

## 2011-06-20 LAB — POCT URINALYSIS DIPSTICK
Bilirubin, UA: NEGATIVE
Glucose, UA: NEGATIVE
Ketones, UA: NEGATIVE
Nitrite, UA: NEGATIVE
pH, UA: 7

## 2011-06-20 LAB — CBC WITH DIFFERENTIAL/PLATELET
Basophils Absolute: 0 10*3/uL (ref 0.0–0.1)
Basophils Relative: 0.6 % (ref 0.0–3.0)
Eosinophils Absolute: 0.2 10*3/uL (ref 0.0–0.7)
Lymphocytes Relative: 24.3 % (ref 12.0–46.0)
MCHC: 33 g/dL (ref 30.0–36.0)
Monocytes Relative: 7.6 % (ref 3.0–12.0)
Neutrophils Relative %: 63.4 % (ref 43.0–77.0)
RBC: 4.17 Mil/uL (ref 3.87–5.11)

## 2011-06-20 LAB — BASIC METABOLIC PANEL
BUN: 21 mg/dL (ref 6–23)
Chloride: 104 mEq/L (ref 96–112)
GFR: 70.95 mL/min (ref >60.00)
Potassium: 5.3 mEq/L — ABNORMAL HIGH (ref 3.5–5.1)
Sodium: 143 mEq/L (ref 135–145)

## 2011-06-20 LAB — ALT: ALT: 27 U/L (ref 0–35)

## 2011-06-20 MED ORDER — CLORAZEPATE DIPOTASSIUM 7.5 MG PO TABS: 7.5000 mg | ORAL_TABLET | Freq: Every day | ORAL | Status: DC

## 2011-06-20 NOTE — Patient Instructions (Signed)
Will notify you  of labs when available. And sed copy to  Dr. Durenda Age.  Continue lifestyle intervention healthy eating and exercise . No change meds at this time.  Wellness visit  In about 6 months .

## 2011-06-23 ENCOUNTER — Telehealth: Payer: Self-pay | Admitting: Internal Medicine

## 2011-06-23 NOTE — Telephone Encounter (Signed)
Pt called and said that she was returning a call from [place] re: labs. Pt req call back asap on Friday morning. Pls call.

## 2011-06-23 NOTE — Progress Notes (Signed)
Quick Note:  Labs sent to Dr. Aaron Mose and copy printed for patient. ______

## 2011-06-23 NOTE — Progress Notes (Signed)
Quick Note:  Left a message for pt to return call. ______ 

## 2011-06-24 NOTE — Progress Notes (Signed)
Quick Note:  Spoke with pt and pt is aware. ______ 

## 2011-06-24 NOTE — Telephone Encounter (Signed)
Spoke with pt and pt is aware of lab results.   

## 2011-06-25 DIAGNOSIS — Z79899 Other long term (current) drug therapy: Secondary | ICD-10-CM | POA: Insufficient documentation

## 2011-06-25 NOTE — Progress Notes (Signed)
Subjective:    Patient ID: Briana Giles, female    DOB: Mar 07, 1945, 66 y.o.   MRN: [id number]  HPI Patient comes in today for follow up of  multiple medical problems.  Sleep: takes tranxene basically eery night for sleep  And that helps  Needs refills denies se and no new memory issues  Allergies Not too bad has flonase  BP on lisinopril seems to run low but no syncope dizziness cv sx.  Pain FM and OA  Has pain med from rheum  Dr D usually takes tylenol   She has order for labs from Dr D and usually gets from [facility] but is here today V58.69 alt ast cr  [place].   Only taking prilosec prn Review of Systems Neg fever ha new vision changes joint issues. GI GU issues but constipation as in past  No sig tremor many things are better since restiring  Past history family history social history reviewed in the electronic medical record.     Objective:   Physical Exam BP 112/72  Pulse 71  Temp(Src) 98.2 F (36.8 C) (Oral)  Wt 133 lb (60.328 kg)  SpO2 97% Repeat bp right arm sitting 120/70 WDWN in nad Looks well Neck: Supple without adenopathy or masses or bruits Chest:  Clear to A&P without wheezes rales or rhonchi CV:  S1-S2 no gallops or murmurs peripheral perfusion is normal No clubbing cyanosis or edema NO acute joint swelling rash  No tremor seen today  Gait normal Oriented x 3 and no noted deficits in memory, attention, and speech.     Assessment & Plan:  Sleep disturbance   On tranxene for a number of years and appears stable so will continue  Risk benefit of medication discussed.  Refill x 6 months worth  HT better now if getting low consider even decreasing dose but is doing well for now  Allergy stable no change MS pain   FM and OA per Dr D   Labs today and send her a copy.  GI ger sx better  using med prn  Constipation managed

## 2011-07-18 ENCOUNTER — Other Ambulatory Visit: Payer: Self-pay

## 2011-07-18 MED ORDER — LISINOPRIL 20 MG PO TABS: 20.0000 mg | ORAL_TABLET | Freq: Every day | ORAL | Status: DC

## 2011-07-18 NOTE — Telephone Encounter (Signed)
Rx sent to pharmacy for lisinopril  .

## 2011-08-11 ENCOUNTER — Telehealth: Payer: Self-pay | Admitting: Internal Medicine

## 2011-08-11 NOTE — Telephone Encounter (Signed)
Caller: Mylinda Latina; PCP: Madelin Headings.; CB#: [phone]; ; ; Call regarding Hip Pain;  Onset- 1-2 months per pt.  Afebrile. Pt c/o of left hip pain that goes down her leg. Emergent s/s of Hip non-injury protocol r/o. Pt to see provider within 72 hrs. Appt made for 08/12/11 at 9:30am with Dr. Fabian Sharp.

## 2011-08-12 ENCOUNTER — Encounter: Payer: Self-pay | Admitting: Internal Medicine

## 2011-08-12 ENCOUNTER — Ambulatory Visit (INDEPENDENT_AMBULATORY_CARE_PROVIDER_SITE_OTHER): Payer: Medicare Other | Admitting: Internal Medicine

## 2011-08-12 VITALS — BP 124/78 | HR 76 | Temp 98.0°F | Wt 131.0 lb

## 2011-08-12 DIAGNOSIS — G479 Sleep disorder, unspecified: Secondary | ICD-10-CM

## 2011-08-12 DIAGNOSIS — M5430 Sciatica, unspecified side: Secondary | ICD-10-CM

## 2011-08-12 DIAGNOSIS — I10 Essential (primary) hypertension: Secondary | ICD-10-CM

## 2011-08-12 DIAGNOSIS — M5432 Sciatica, left side: Secondary | ICD-10-CM

## 2011-08-12 DIAGNOSIS — M48061 Spinal stenosis, lumbar region without neurogenic claudication: Secondary | ICD-10-CM

## 2011-08-12 MED ORDER — CLORAZEPATE DIPOTASSIUM 7.5 MG PO TABS: 7.5000 mg | ORAL_TABLET | Freq: Every day | ORAL | Status: DC

## 2011-08-12 MED ORDER — PREDNISONE 10 MG PO TABS: 10.0000 mg | ORAL_TABLET | Freq: Every day | ORAL | Status: DC

## 2011-08-12 NOTE — Progress Notes (Signed)
Subjective:    Patient ID: Briana Giles, female    DOB: 1945-11-05, 66 y.o.   MRN: [id number]  HPI Patient comes in today for SDA for  new problem evaluation. Onset insidious after gardening of left.leg hip and buttock pain that is radiating off and on to the foot. ?  could be a pulled muscle. Waxing and waning but bad when sitting for a while pain in left post thigh. Nhard to go in car.  Continuing over weeks.   Has hx of stable left back and disc problem . Has spinal stenosis . Has seen dr Phoebe Perch in the past. Last time 6 months ago . Rx for her tranxene got transferred to [place] when traveling but didn't pick it up so doesn't have the rx with refills and could be difficult to get transferred back.  Asks for another rx . Not using differently   Review of Systems Neg weakness bowel or bladder change new rash falling . Other neuro sx and no current numbness this pain is different that the stenosis issue.  No fever systemic sx   rest as per hpi  Past history family history social history reviewed in the electronic medical record. Meds reviewed .    Objective:   Physical Exam BP 124/78  Pulse 76  Temp 98 F (36.7 C) (Oral)  Wt 131 lb (59.421 kg)  SpO2 97% WDWN in nad  Gait almost normal midl favoring  Back no focal pain poiint to left buttocks  ? Neg slr now and strength seems normal toe heel elevation . Abdomen:  Sof,t normal bowel sounds without hepatosplenomegaly, no guarding rebound or masses no CVA tenderness DTRs present no clonus   No tremor today  Oriented x 3 and no noted deficits in memory, attention, and speech.     Assessment & Plan:  Back pain sciatica sx but radiates to leg in setting of old back disease.  Is limiting celebrex cause of renal tox effects.  HT stable  Treatment options discussed. Prednisone taper and fu . Ay need to see NS again in fu if  persistent or progressive   Sleep anxiety  Re written with refills  For local pharmacy.   Total visit  > 50% spent counseling and coordinating care

## 2011-08-12 NOTE — Patient Instructions (Signed)
I believe your symptoms are from your back a pinched nerve . Prolong sitting should be avoided. You can take the Celebrex as needed with care but we will give you a 12 day course of prednisone to decrease swelling around the back to see if this improves her symptoms. If this problem is persistent or progressive we were should have you see your neurosurgeon.  Contact us with alarm features such as weakness sudden worsening bowel or bladder changes.

## 2011-08-14 DIAGNOSIS — M48061 Spinal stenosis, lumbar region without neurogenic claudication: Secondary | ICD-10-CM | POA: Insufficient documentation

## 2011-08-14 DIAGNOSIS — M5432 Sciatica, left side: Secondary | ICD-10-CM | POA: Insufficient documentation

## 2011-09-02 ENCOUNTER — Ambulatory Visit (INDEPENDENT_AMBULATORY_CARE_PROVIDER_SITE_OTHER): Payer: Medicare Other | Admitting: Internal Medicine

## 2011-09-02 ENCOUNTER — Encounter: Payer: Self-pay | Admitting: Internal Medicine

## 2011-09-02 VITALS — BP 116/64 | HR 101 | Temp 98.5°F | Wt 131.0 lb

## 2011-09-02 DIAGNOSIS — M25559 Pain in unspecified hip: Secondary | ICD-10-CM

## 2011-09-02 DIAGNOSIS — M1990 Unspecified osteoarthritis, unspecified site: Secondary | ICD-10-CM

## 2011-09-02 DIAGNOSIS — M48061 Spinal stenosis, lumbar region without neurogenic claudication: Secondary | ICD-10-CM

## 2011-09-02 DIAGNOSIS — M5430 Sciatica, unspecified side: Secondary | ICD-10-CM

## 2011-09-02 DIAGNOSIS — M5432 Sciatica, left side: Secondary | ICD-10-CM

## 2011-09-02 MED ORDER — PREDNISONE 10 MG PO TABS: 10.0000 mg | ORAL_TABLET | Freq: Every day | ORAL | Status: DC

## 2011-09-02 NOTE — Patient Instructions (Signed)
I would like you to see you orthopedist Dr. Daphine Deutscher about your pain. At this time is unclear to me if any sure hip joint or your back or both it is causing her current predicament. It is okay that we repeat the prednisone taper in the meantime. We'll send a copy of our note to Dr. Daphine Deutscher. It is possible you may need to see a neurosurgeon or repeat the MRI again.

## 2011-09-02 NOTE — Progress Notes (Signed)
Subjective:    Patient ID: Briana Giles, female    DOB: 1945-10-04, 66 y.o.   MRN: [id number]  HPI Comes in for fu of back pain with radation  See last visit where given prednisone  this may have helped temporarily however she is back to where she was before with pain with sitting radiates down the back of her left leg however she also has pain from the left hip radiating around to her groin area. She is able to walk but is difficult to drive. States that pain is 8 range yesterday when she was driving today is about a 5. She now is getting some symptoms on her right side. No new symptoms. She does have a copy of her old MRI from 2010 which showed significant facet and degenerative disease.  She sees the orthopedist Dr. Daphine Deutscher at [facility] in August regard to her knee followup.   Review of Systems No fever falling uti GI sx rash   Upper ext changes . No  Weakness in leg.  Outpatient Encounter Prescriptions as of 09/02/2011  Medication Sig Dispense Refill  . acetaminophen (TYLENOL) 500 MG tablet Take 1,000 mg by mouth every 6 (six) hours as needed.        Marland Kitchen aspirin 81 MG tablet Take 81 mg by mouth daily.        . Calcium Carbonate-Vit D-Min (CALTRATE 600+D PLUS) 600-400 MG-UNIT per tablet Take 1 tablet by mouth daily.        . clorazepate (TRANXENE) 7.5 MG tablet Take 1 tablet (7.5 mg total) by mouth at bedtime.  30 tablet  5  . cyclobenzaprine (FLEXERIL) 10 MG tablet Take 10 mg by mouth as directed.        . fluticasone (FLONASE) 50 MCG/ACT nasal spray Place 2 sprays into the nose daily.        Marland Kitchen lidocaine (LIDODERM) 5 % Place 1 patch onto the skin daily. Remove & Discard patch within 12 hours or as directed by MD       . lisinopril (PRINIVIL,ZESTRIL) 20 MG tablet Take 1 tablet (20 mg total) by mouth daily.  90 tablet  1  . methocarbamol (ROBAXIN) 500 MG tablet Take 500 mg by mouth as directed.        . polyethylene glycol powder (MIRALAX) powder Take 17 g by mouth as directed.         . sennosides-docusate sodium (SENOKOT-S) 8.6-50 MG tablet Take 1 tablet by mouth daily.        Marland Kitchen DISCONTD: omeprazole (PRILOSEC) 20 MG capsule TAKE ONE CAPSULE BY MOUTH ONCE EVERY DAY  30 capsule  3  . DISCONTD: predniSONE (DELTASONE) 10 MG tablet Take 1 tablet (10 mg total) by mouth daily. Take pills per day,6,6,6,4,4,4,2,2,2,1,1,1  40 tablet  0  Past history family history social history reviewed in the electronic medical record.     Objective:   Physical Exam BP 116/64  Pulse 101  Temp 98.5 F (36.9 C) (Oral)  Wt 131 lb (59.421 kg)  SpO2 95%  WDWN in nad  Gait facile no  Limp uncomfortable sitting No focal tenderness negative SLR no obvious weakness Review of old MRI report. Multilevel Sp st moderate and foraminal stenosis 6 2010    Assessment & Plan:   Persistent pain left back hip radiating down the back of the leg with temporary minor response to prednisone.  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  Uncertain etiology of the worst part  of her pain consider hip girdle and back.  Would have Dr. Daphine Deutscher  assess this area also  and it felt to be coming from her back we can get neurosurgery to see her second opinion. She may need another MRI of her back.  Send him a copy of our note for her upcoming appointments.  Patient requests another round of the prednisone and we'll do this in the meantime.

## 2011-10-17 ENCOUNTER — Other Ambulatory Visit: Payer: Self-pay | Admitting: Internal Medicine

## 2011-12-27 ENCOUNTER — Ambulatory Visit (INDEPENDENT_AMBULATORY_CARE_PROVIDER_SITE_OTHER): Payer: Medicare Other | Admitting: Internal Medicine

## 2011-12-27 ENCOUNTER — Encounter: Payer: Self-pay | Admitting: Internal Medicine

## 2011-12-27 VITALS — BP 120/64 | HR 72 | Temp 97.7°F | Ht 60.5 in | Wt 131.0 lb

## 2011-12-27 DIAGNOSIS — Z13220 Encounter for screening for lipoid disorders: Secondary | ICD-10-CM

## 2011-12-27 DIAGNOSIS — Z0000 Encounter for general adult medical examination without abnormal findings: Secondary | ICD-10-CM

## 2011-12-27 DIAGNOSIS — Z1211 Encounter for screening for malignant neoplasm of colon: Secondary | ICD-10-CM

## 2011-12-27 DIAGNOSIS — I10 Essential (primary) hypertension: Secondary | ICD-10-CM

## 2011-12-27 DIAGNOSIS — M5432 Sciatica, left side: Secondary | ICD-10-CM

## 2011-12-27 DIAGNOSIS — M899 Disorder of bone, unspecified: Secondary | ICD-10-CM

## 2011-12-27 DIAGNOSIS — M949 Disorder of cartilage, unspecified: Secondary | ICD-10-CM

## 2011-12-27 DIAGNOSIS — Z79899 Other long term (current) drug therapy: Secondary | ICD-10-CM

## 2011-12-27 DIAGNOSIS — M5430 Sciatica, unspecified side: Secondary | ICD-10-CM

## 2011-12-27 DIAGNOSIS — G479 Sleep disorder, unspecified: Secondary | ICD-10-CM

## 2011-12-27 DIAGNOSIS — Z23 Encounter for immunization: Secondary | ICD-10-CM

## 2011-12-27 DIAGNOSIS — I8390 Asymptomatic varicose veins of unspecified lower extremity: Secondary | ICD-10-CM

## 2011-12-27 DIAGNOSIS — M25559 Pain in unspecified hip: Secondary | ICD-10-CM

## 2011-12-27 DIAGNOSIS — M1990 Unspecified osteoarthritis, unspecified site: Secondary | ICD-10-CM

## 2011-12-27 LAB — CBC WITH DIFFERENTIAL/PLATELET
Basophils Relative: 1.5 % (ref 0.0–3.0)
Eosinophils Absolute: 0.3 10*3/uL (ref 0.0–0.7)
Hemoglobin: 13.6 g/dL (ref 12.0–15.0)
Lymphs Abs: 1.2 10*3/uL (ref 0.7–4.0)
MCHC: 33.1 g/dL (ref 30.0–36.0)
MCV: 96.4 fl (ref 78.0–100.0)
Monocytes Absolute: 0.4 10*3/uL (ref 0.1–1.0)
Neutro Abs: 2.5 10*3/uL (ref 1.4–7.7)
RBC: 4.26 Mil/uL (ref 3.87–5.11)

## 2011-12-27 LAB — LIPID PANEL
HDL: 67.6 mg/dL (ref >39.00)
Triglycerides: 50 mg/dL (ref 0.0–149.0)

## 2011-12-27 LAB — HEPATIC FUNCTION PANEL
Albumin: 4.4 g/dL (ref 3.5–5.2)
Total Protein: 7.3 g/dL (ref 6.0–8.3)

## 2011-12-27 LAB — POCT URINALYSIS DIPSTICK
Ketones, UA: NEGATIVE
Leukocytes, UA: NEGATIVE
Protein, UA: NEGATIVE
pH, UA: 7

## 2011-12-27 LAB — BASIC METABOLIC PANEL
CO2: 28 mEq/L (ref 19–32)
Chloride: 104 mEq/L (ref 96–112)
Sodium: 141 mEq/L (ref 135–145)

## 2011-12-27 MED ORDER — CLORAZEPATE DIPOTASSIUM 7.5 MG PO TABS: 7.5000 mg | ORAL_TABLET | Freq: Every day | ORAL | Status: DC

## 2011-12-27 NOTE — Progress Notes (Signed)
Chief Complaint  Patient presents with  . Annual Exam    Medicare medications    HPI: Patient comes in today for Preventive Medicare wellness visit . No major injuries, ed visits ,hospitalizations , new medications since last visit. Problem area related to arthritis and joint pains: Went to ortho  And went to pT and didn't help that much for the sciatica sx ; To see spinal surgeon yet. Back and to right area. To hip  Taking celebrex  100 mg most day  Less  Than 200 mg  Robaxin as needed.  HCM due for flu and colonscopy BP :stable on medication no se noted  Sleep taking tranxene for years     Hearing:  Ok   Vision:  No limitations at present .  Safety:  Has smoke detector and wears seat belts.  No firearms. No excess sun exposure. Sees dentist regularly.  Falls: no  Advance directive :  Reviewed    Memory: Felt to be good  , no concern from her or her family.  Depression: No anhedonia unusual crying or depressive symptoms  Nutrition: Eats well balanced diet; adequate calcium and vitamin D. No swallowing chewiing problems.  Injury: no major injuries in the last six months.  Other healthcare providers:  Reviewed today .  Social:  Lives with husband married. No pets.   Preventive parameters: reviewed see above  ADLS:   There are no problems or need for assistance  driving, feeding, obtaining food, dressing, toileting and bathing, managing money using phone. She is independent.  Exercise water  No tob etoh hs    ROS:  GEN/ HEENT: No fever, significant weight changes sweats headaches vision problems hearing changes, CV/ PULM; No chest pain shortness of breath cough, syncope,edema  change in exercise tolerance. GI /GU: No adominal pain, vomiting, change in bowel habits. No blood in the stool. No significant GU symptoms.some constipation  SKIN/HEME: ,no acute skin rashes suspicious lesions or bleeding. No lymphadenopathy, nodules, masses.  NEURO/ PSYCH:  No neurologic signs  such as weakness numbness. No depression anxiety. IMM/ Allergy: No unusual infections.  Allergy .   REST of 12 system review negative except as per HPI   Past Medical History  Diagnosis Date  . Allergic rhinitis   . GERD (gastroesophageal reflux disease)   . Osteoarthritis   . Heart murmur   . Cervical back pain with evidence of disc disease   . Nerve damage     left arm  . Transient global amnesia   . Abnormal LFTs   . Phlebitis   . Spinal stenosis   . Osteopenia   . Hypertension   . Abdominal  pain, other specified site     Family History  Problem Relation Age of Onset  . COPD Mother   . Osteoporosis Mother   . Heart disease Mother   . Hypertension Brother   . Hypertension Father   . Alcohol abuse Father   . Lung cancer Father   . Prostate cancer Maternal Grandfather   . Cancer Maternal Grandmother     sinus    History   Social History  . Marital Status: Married    Spouse Name: N/A    Number of Children: 2  . Years of Education: N/A   Occupational History  . retired Runner, broadcasting/film/video   .     Social History Main Topics  . Smoking status: Never Smoker   . Smokeless tobacco: Never Used  . Alcohol Use: 8.4 oz/week  14 Glasses of wine per week  . Drug Use: None  . Sexually Active: None   Other Topics Concern  . 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  [place] exercise aerob[place] walking elliptical     Outpatient Encounter Prescriptions as of 12/27/2011  Medication Sig Dispense Refill  . acetaminophen (TYLENOL) 500 MG tablet Take 1,000 mg by mouth every 6 (six) hours as needed.        Marland Kitchen aspirin 81 MG tablet Take 81 mg by mouth daily.        . celecoxib (CELEBREX) 100 MG capsule Take 100 mg by mouth daily.      . Cholecalciferol (VITAMIN D PO) Take by mouth.      . clorazepate (TRANXENE) 7.5 MG tablet Take 1 tablet (7.5 mg total) by mouth at bedtime.  30 tablet  5    . cyclobenzaprine (FLEXERIL) 10 MG tablet Take 10 mg by mouth as directed.        . fluticasone (FLONASE) 50 MCG/ACT nasal spray Place 2 sprays into the nose daily.        Marland Kitchen lidocaine (LIDODERM) 5 % Place 1 patch onto the skin daily. Remove & Discard patch within 12 hours or as directed by MD       . lisinopril (PRINIVIL,ZESTRIL) 20 MG tablet TAKE ONE TABLET BY MOUTH DAILY.  90 tablet  0  . methocarbamol (ROBAXIN) 500 MG tablet Take 500 mg by mouth as directed.        . polyethylene glycol powder (MIRALAX) powder Take 17 g by mouth as directed.        . sennosides-docusate sodium (SENOKOT-S) 8.6-50 MG tablet Take 1 tablet by mouth daily.        . traMADol (ULTRAM) 50 MG tablet Take 50 mg by mouth.      . [DISCONTINUED] clorazepate (TRANXENE) 7.5 MG tablet Take 1 tablet (7.5 mg total) by mouth at bedtime.  30 tablet  5  . [DISCONTINUED] Calcium Carbonate-Vit D-Min (CALTRATE 600+D PLUS) 600-400 MG-UNIT per tablet Take 1 tablet by mouth daily.        . [DISCONTINUED] predniSONE (DELTASONE) 10 MG tablet Take 1 tablet (10 mg total) by mouth daily. Take pills per day,6,6,6,4,4,4,2,2,2,1,1,1  40 tablet  0    EXAM:  BP 120/64  Pulse 72  Temp 97.7 F (36.5 C) (Oral)  Ht 5' 0.5" (1.537 m)  Wt 131 lb (59.421 kg)  BMI 25.16 kg/m2  Body mass index is 25.16 kg/(m^2).  Physical Exam: Vital signs reviewed [id number] is a well-developed well-nourished alert cooperative   female who appears her stated age in no acute distress.  HEENT: normocephalic atraumatic , Eyes: PERRL EOM's full, conjunctiva clear, Nares: paten,t no deformity discharge or tenderness., Ears: no deformity EAC's clear TMs with normal landmarks. Mouth: clear OP, no lesions, edema.  Moist mucous membranes. Dentition in adequate repair. NECK: supple without masses, thyromegaly or bruits. CHEST/PULM:  Clear to auscultation and percussion breath sounds equal no wheeze , rales or rhonchi. No chest wall deformities or tenderness. CV: PMI is  nondisplaced, S1 S2 no gallops, murmurs, rubs. Peripheral pulses are full without delay.No JVD .  Breast: normal by inspection . No dimpling, discharge, masses, tenderness or discharge . ABDOMEN: Bowel sounds normal nontender  No guard or rebound, no hepato splenomegal no CVA tenderness.  No hernia. Extremtities:  No clubbing cyanosis or edema, no acute joint swelling or  redness no focal atrophy  oa changes   Varicose veins no ulcers NEURO:  Oriented x3, cranial nerves 3-12 appear to be intact, no obvious focal weakness,gait wnl but slightly antalgic no tremor noted today  SKIN: No acute rashes normal turgor, color, no bruising or petechiae. PSYCH: Oriented, good eye contact, no obvious depression anxiety, cognition and judgment appear normal. LN: no cervical axillary inguinal adenopathy Oriented x 3 and no noted deficits in memory, attention, and speech.   Lab Results  Component Value Date   WBC 4.5 12/27/2011   HGB 13.6 12/27/2011   HCT 41.1 12/27/2011   PLT 238.0 12/27/2011   GLUCOSE 85 12/27/2011   CHOL 210* 12/27/2011   TRIG 50.0 12/27/2011   HDL 67.60 12/27/2011   LDLDIRECT 127.4 12/27/2011   LDLCALC 112* 06/16/2009   ALT 36* 12/27/2011   AST 33 12/27/2011   NA 141 12/27/2011   K 5.5* 12/27/2011   CL 104 12/27/2011   CREATININE 0.8 12/27/2011   BUN 28* 12/27/2011   CO2 28 12/27/2011   TSH 1.39 12/27/2011    ASSESSMENT AND PLAN:  Discussed the following assessment and plan: Preventive Health Care Counseled regarding healthy nutrition, exercise, sleep, injury prevention, calcium vit d and healthy weight . Flu vaccine today  1. Medicare annual wellness visit, subsequent     due for colonoscopy.  2. Need for prophylactic vaccination and inoculation against influenza  Cholecalciferol (VITAMIN D PO), celecoxib (CELEBREX) 100 MG capsule, traMADol (ULTRAM) 50 MG tablet, Basic metabolic panel, CBC with Differential, Hepatic function panel, Lipid panel, TSH  3. Hip pain   Cholecalciferol (VITAMIN D PO), celecoxib (CELEBREX) 100 MG capsule, traMADol (ULTRAM) 50 MG tablet, Basic metabolic panel, CBC with Differential, Hepatic function panel, Lipid panel, TSH  4. Sciatica of left side  Cholecalciferol (VITAMIN D PO), celecoxib (CELEBREX) 100 MG capsule, traMADol (ULTRAM) 50 MG tablet, Basic metabolic panel, CBC with Differential, Hepatic function panel, Lipid panel, TSH  5. Need for lipid screening  Cholecalciferol (VITAMIN D PO), celecoxib (CELEBREX) 100 MG capsule, traMADol (ULTRAM) 50 MG tablet, Basic metabolic panel, CBC with Differential, Hepatic function panel, Lipid panel, TSH  6. OSTEOPENIA  Cholecalciferol (VITAMIN D PO), celecoxib (CELEBREX) 100 MG capsule, traMADol (ULTRAM) 50 MG tablet, Basic metabolic panel, CBC with Differential, Hepatic function panel, Lipid panel, TSH, DG Bone Density   due for dexa cannot find prev results  bu was in 2010  7. HYPERTENSION  Cholecalciferol (VITAMIN D PO), celecoxib (CELEBREX) 100 MG capsule, traMADol (ULTRAM) 50 MG tablet, Basic metabolic panel, CBC with Differential, Hepatic function panel, Lipid panel, TSH, POCT urinalysis dipstick  8. OSTEOARTHRITIS  Cholecalciferol (VITAMIN D PO), celecoxib (CELEBREX) 100 MG capsule, traMADol (ULTRAM) 50 MG tablet, Basic metabolic panel, CBC with Differential, Hepatic function panel, Lipid panel, TSH  9. VARICOSE VEINS, LOWER EXTREMITIES    10. SYMPTOM, DISTURBANCE, SLEEP NOS    11. High risk medication use    12. Screen for colon cancer  Ambulatory referral to Gastroenterology   refer for routine colonoscopy.   Patient Instructions  Will notify you  of labs when available. Continue lifestyle intervention healthy eating and exercise . You will be contacted about colonoscopy referral.  ROV yearly or if needed as per lab.  It looks like you had a dexa scan in 2010 but I cant  See results in the EHR .   Will let you know when due.depending on results.  Preventive Care for Adults,  Female A healthy lifestyle and preventive care can promote  health and wellness. Preventive health guidelines for women include the following key practices.  A routine yearly physical is a good way to check with your caregiver about your health and preventive screening. It is a chance to share any concerns and updates on your health, and to receive a thorough exam.  Visit your dentist for a routine exam and preventive care every 6 months. Brush your teeth twice a day and floss once a day. Good oral hygiene prevents tooth decay and gum disease.  The frequency of eye exams is based on your age, health, family medical history, use of contact lenses, and other factors. Follow your caregiver's recommendations for frequency of eye exams.  Eat a healthy diet. Foods like vegetables, fruits, whole grains, low-fat dairy products, and lean protein foods contain the nutrients you need without too many calories. Decrease your intake of foods high in solid fats, added sugars, and salt. Eat the right amount of calories for you.Get information about a proper diet from your caregiver, if necessary.  Regular physical exercise is one of the most important things you can do for your health. Most adults should get at least 150 minutes of moderate-intensity exercise (any activity that increases your heart rate and causes you to sweat) each week. In addition, most adults need muscle-strengthening exercises on 2 or more days a week.  Maintain a healthy weight. The body mass index (BMI) is a screening tool to identify possible weight problems. It provides an estimate of body fat based on height and weight. Your caregiver can help determine your BMI, and can help you achieve or maintain a healthy weight.For adults 20 years and older:  A BMI below 18.5 is considered underweight.  A BMI of 18.5 to 24.9 is normal.  A BMI of 25 to 29.9 is considered overweight.  A BMI of 30 and above is considered obese.  Maintain normal  blood lipids and cholesterol levels by exercising and minimizing your intake of saturated fat. Eat a balanced diet with plenty of fruit and vegetables. Blood tests for lipids and cholesterol should begin at age 37 and be repeated every 5 years. If your lipid or cholesterol levels are high, you are over 50, or you are at high risk for heart disease, you may need your cholesterol levels checked more frequently.Ongoing high lipid and cholesterol levels should be treated with medicines if diet and exercise are not effective.  If you smoke, find out from your caregiver how to quit. If you do not use tobacco, do not start.  If you are pregnant, do not drink alcohol. If you are breastfeeding, be very cautious about drinking alcohol. If you are not pregnant and choose to drink alcohol, do not exceed 1 drink per day. One drink is considered to be 12 ounces (355 mL) of beer, 5 ounces (148 mL) of wine, or 1.5 ounces (44 mL) of liquor.  Avoid use of street drugs. Do not share needles with anyone. Ask for help if you need support or instructions about stopping the use of drugs.  High blood pressure causes heart disease and increases the risk of stroke. Your blood pressure should be checked at least every 1 to 2 years. Ongoing high blood pressure should be treated with medicines if weight loss and exercise are not effective.  If you are 53 to 67 years old, ask your caregiver if you should take aspirin to prevent strokes.  Diabetes screening involves taking a blood sample to check your fasting blood sugar level.  This should be done once every 3 years, after age 59, if you are within normal weight and without risk factors for diabetes. Testing should be considered at a younger age or be carried out more frequently if you are overweight and have at least 1 risk factor for diabetes.  Breast cancer screening is essential preventive care for women. You should practice "breast self-awareness." This means understanding the  normal appearance and feel of your breasts and may include breast self-examination. Any changes detected, no matter how small, should be reported to a caregiver. Women in their 62s and 30s should have a clinical breast exam (CBE) by a caregiver as part of a regular health exam every 1 to 3 years. After age 45, women should have a CBE every year. Starting at age 32, women should consider having a mammography (breast X-ray test) every year. Women who have a family history of breast cancer should talk to their caregiver about genetic screening. Women at a high risk of breast cancer should talk to their caregivers about having magnetic resonance imaging (MRI) and a mammography every year.  The Pap test is a screening test for cervical cancer. A Pap test can show cell changes on the cervix that might become cervical cancer if left untreated. A Pap test is a procedure in which cells are obtained and examined from the lower end of the uterus (cervix).  Women should have a Pap test starting at age 51.  Between ages 41 and 77, Pap tests should be repeated every 2 years.  Beginning at age 36, you should have a Pap test every 3 years as long as the past 3 Pap tests have been normal.  Some women have medical problems that increase the chance of getting cervical cancer. Talk to your caregiver about these problems. It is especially important to talk to your caregiver if a new problem develops soon after your last Pap test. In these cases, your caregiver may recommend more frequent screening and Pap tests.  The above recommendations are the same for women who have or have not gotten the vaccine for human papillomavirus (HPV).  If you had a hysterectomy for a problem that was not cancer or a condition that could lead to cancer, then you no longer need Pap tests. Even if you no longer need a Pap test, a regular exam is a good idea to make sure no other problems are starting.  If you are between ages 2 and 61, and  you have had normal Pap tests going back 10 years, you no longer need Pap tests. Even if you no longer need a Pap test, a regular exam is a good idea to make sure no other problems are starting.  If you have had past treatment for cervical cancer or a condition that could lead to cancer, you need Pap tests and screening for cancer for at least 20 years after your treatment.  If Pap tests have been discontinued, risk factors (such as a new sexual partner) need to be reassessed to determine if screening should be resumed.  The HPV test is an additional test that may be used for cervical cancer screening. The HPV test looks for the virus that can cause the cell changes on the cervix. The cells collected during the Pap test can be tested for HPV. The HPV test could be used to screen women aged 31 years and older, and should be used in women of any age who have unclear Pap test results.  After the age of 5, women should have HPV testing at the same frequency as a Pap test.  Colorectal cancer can be detected and often prevented. Most routine colorectal cancer screening begins at the age of 78 and continues through age 14. However, your caregiver may recommend screening at an earlier age if you have risk factors for colon cancer. On a yearly basis, your caregiver may provide home test kits to check for hidden blood in the stool. Use of a small camera at the end of a tube, to directly examine the colon (sigmoidoscopy or colonoscopy), can detect the earliest forms of colorectal cancer. Talk to your caregiver about this at age 6, when routine screening begins. Direct examination of the colon should be repeated every 5 to 10 years through age 77, unless early forms of pre-cancerous polyps or small growths are found.  Hepatitis C blood testing is recommended for all people born from [birth year] through 1966 and any individual with known risks for hepatitis C.  Practice safe sex. Use condoms and avoid high-risk sexual  practices to reduce the spread of sexually transmitted infections (STIs). STIs include gonorrhea, chlamydia, syphilis, trichomonas, herpes, HPV, and human immunodeficiency virus (HIV). Herpes, HIV, and HPV are viral illnesses that have no cure. They can result in disability, cancer, and death. Sexually active women aged 31 and younger should be checked for chlamydia. Older women with new or multiple partners should also be tested for chlamydia. Testing for other STIs is recommended if you are sexually active and at increased risk.  Osteoporosis is a disease in which the bones lose minerals and strength with aging. This can result in serious bone fractures. The risk of osteoporosis can be identified using a bone density scan. Women ages 32 and over and women at risk for fractures or osteoporosis should discuss screening with their caregivers. Ask your caregiver whether you should take a calcium supplement or vitamin D to reduce the rate of osteoporosis.  Menopause can be associated with physical symptoms and risks. Hormone replacement therapy is available to decrease symptoms and risks. You should talk to your caregiver about whether hormone replacement therapy is right for you.  Use sunscreen with sun protection factor (SPF) of 30 or more. Apply sunscreen liberally and repeatedly throughout the day. You should seek shade when your shadow is shorter than you. Protect yourself by wearing long sleeves, pants, a wide-brimmed hat, and sunglasses year round, whenever you are outdoors.  Once a month, do a whole body skin exam, using a mirror to look at the skin on your back. Notify your caregiver of new moles, moles that have irregular borders, moles that are larger than a pencil eraser, or moles that have changed in shape or color.  Stay current with required immunizations.  Influenza. You need a dose every fall (or winter). The composition of the flu vaccine changes each year, so being vaccinated once is not  enough.  Pneumococcal polysaccharide. You need 1 to 2 doses if you smoke cigarettes or if you have certain chronic medical conditions. You need 1 dose at age 68 (or older) if you have never been vaccinated.  Tetanus, diphtheria, pertussis (Tdap, Td). Get 1 dose of Tdap vaccine if you are younger than age 15, are over 34 and have contact with an infant, are a Research scientist (physical sciences), are pregnant, or simply want to be protected from whooping cough. After that, you need a Td booster dose every 10 years. Consult your caregiver if you have not had  at least 3 tetanus and diphtheria-containing shots sometime in your life or have a deep or dirty wound.  HPV. You need this vaccine if you are a woman age 4 or younger. The vaccine is given in 3 doses over 6 months.  Measles, mumps, rubella (MMR). You need at least 1 dose of MMR if you were born in 1957 or later. You may also need a second dose.  Meningococcal. If you are age 31 to 16 and a first-year college student living in a residence hall, or have one of several medical conditions, you need to get vaccinated against meningococcal disease. You may also need additional booster doses.  Zoster (shingles). If you are age 28 or older, you should get this vaccine.  Varicella (chickenpox). If you have never had chickenpox or you were vaccinated but received only 1 dose, talk to your caregiver to find out if you need this vaccine.  Hepatitis A. You need this vaccine if you have a specific risk factor for hepatitis A virus infection or you simply wish to be protected from this disease. The vaccine is usually given as 2 doses, 6 to 18 months apart.  Hepatitis B. You need this vaccine if you have a specific risk factor for hepatitis B virus infection or you simply wish to be protected from this disease. The vaccine is given in 3 doses, usually over 6 months. Preventive Services / Frequency Ages 67 and over  Blood pressure check.** / Every 1 to 2 years.  Lipid and  cholesterol check.** / Every 5 years beginning at age 85.  Clinical breast exam.** / Every year after age 21.  Mammogram.** / Every year beginning at age 75 and continuing for as long as you are in good health. Consult with your caregiver.  Pap test.** / Every 3 years starting at age 5 through age 11 or 58 with a 3 consecutive normal Pap tests. Testing can be stopped between 65 and 70 with 3 consecutive normal Pap tests and no abnormal Pap or HPV tests in the past 10 years.  HPV screening.** / Every 3 years from ages 64 through ages 28 or 90 with a history of 3 consecutive normal Pap tests. Testing can be stopped between 65 and 70 with 3 consecutive normal Pap tests and no abnormal Pap or HPV tests in the past 10 years.  Fecal occult blood test (FOBT) of stool. / Every year beginning at age 20 and continuing until age 54. You may not need to do this test if you get a colonoscopy every 10 years.  Flexible sigmoidoscopy or colonoscopy.** / Every 5 years for a flexible sigmoidoscopy or every 10 years for a colonoscopy beginning at age 32 and continuing until age 53.  Hepatitis C blood test.** / For all people born from [birth year] through 1966 and any individual with known risks for hepatitis C.  Osteoporosis screening.** / A one-time screening for women ages 35 and over and women at risk for fractures or osteoporosis.  Skin self-exam. / Monthly.  Influenza immunization.** / Every year.  Pneumococcal polysaccharide immunization.** / 1 dose at age 79 (or older) if you have never been vaccinated.  Tetanus, diphtheria, pertussis (Tdap, Td) immunization. / A one-time dose of Tdap vaccine if you are over 65 and have contact with an infant, are a Research scientist (physical sciences), or simply want to be protected from whooping cough. After that, you need a Td booster dose every 10 years.  Varicella immunization.** / Consult your caregiver.  Meningococcal  immunization.** / Consult your caregiver.  Hepatitis A  immunization.** / Consult your caregiver. 2 doses, 6 to 18 months apart.  Hepatitis B immunization.** / Check with your caregiver. 3 doses, usually over 6 months. ** Family history and personal history of risk and conditions may change your caregiver's recommendations. Document Released: 03/29/2001 Document Revised: 04/25/2011 Document Reviewed: 06/28/2010 [facility] Patient Information 2013 [place], [place].      Neta Mends. Panosh M.D.

## 2011-12-27 NOTE — Patient Instructions (Addendum)
Will notify you  of labs when available. Continue lifestyle intervention healthy eating and exercise . You will be contacted about colonoscopy referral.  ROV yearly or if needed as per lab.  It looks like you had a dexa scan in 2010 but I cant  See results in the EHR .   Will let you know when due.depending on results.  Preventive Care for Adults, Female A healthy lifestyle and preventive care can promote health and wellness. Preventive health guidelines for women include the following key practices.  A routine yearly physical is a good way to check with your caregiver about your health and preventive screening. It is a chance to share any concerns and updates on your health, and to receive a thorough exam.  Visit your dentist for a routine exam and preventive care every 6 months. Brush your teeth twice a day and floss once a day. Good oral hygiene prevents tooth decay and gum disease.  The frequency of eye exams is based on your age, health, family medical history, use of contact lenses, and other factors. Follow your caregiver's recommendations for frequency of eye exams.  Eat a healthy diet. Foods like vegetables, fruits, whole grains, low-fat dairy products, and lean protein foods contain the nutrients you need without too many calories. Decrease your intake of foods high in solid fats, added sugars, and salt. Eat the right amount of calories for you.Get information about a proper diet from your caregiver, if necessary.  Regular physical exercise is one of the most important things you can do for your health. Most adults should get at least 150 minutes of moderate-intensity exercise (any activity that increases your heart rate and causes you to sweat) each week. In addition, most adults need muscle-strengthening exercises on 2 or more days a week.  Maintain a healthy weight. The body mass index (BMI) is a screening tool to identify possible weight problems. It provides an estimate of body  fat based on height and weight. Your caregiver can help determine your BMI, and can help you achieve or maintain a healthy weight.For adults 20 years and older:  A BMI below 18.5 is considered underweight.  A BMI of 18.5 to 24.9 is normal.  A BMI of 25 to 29.9 is considered overweight.  A BMI of 30 and above is considered obese.  Maintain normal blood lipids and cholesterol levels by exercising and minimizing your intake of saturated fat. Eat a balanced diet with plenty of fruit and vegetables. Blood tests for lipids and cholesterol should begin at age 78 and be repeated every 5 years. If your lipid or cholesterol levels are high, you are over 50, or you are at high risk for heart disease, you may need your cholesterol levels checked more frequently.Ongoing high lipid and cholesterol levels should be treated with medicines if diet and exercise are not effective.  If you smoke, find out from your caregiver how to quit. If you do not use tobacco, do not start.  If you are pregnant, do not drink alcohol. If you are breastfeeding, be very cautious about drinking alcohol. If you are not pregnant and choose to drink alcohol, do not exceed 1 drink per day. One drink is considered to be 12 ounces (355 mL) of beer, 5 ounces (148 mL) of wine, or 1.5 ounces (44 mL) of liquor.  Avoid use of street drugs. Do not share needles with anyone. Ask for help if you need support or instructions about stopping the use of drugs.  High blood pressure causes heart disease and increases the risk of stroke. Your blood pressure should be checked at least every 1 to 2 years. Ongoing high blood pressure should be treated with medicines if weight loss and exercise are not effective.  If you are 96 to 67 years old, ask your caregiver if you should take aspirin to prevent strokes.  Diabetes screening involves taking a blood sample to check your fasting blood sugar level. This should be done once every 3 years, after age 28,  if you are within normal weight and without risk factors for diabetes. Testing should be considered at a younger age or be carried out more frequently if you are overweight and have at least 1 risk factor for diabetes.  Breast cancer screening is essential preventive care for women. You should practice "breast self-awareness." This means understanding the normal appearance and feel of your breasts and may include breast self-examination. Any changes detected, no matter how small, should be reported to a caregiver. Women in their 69s and 30s should have a clinical breast exam (CBE) by a caregiver as part of a regular health exam every 1 to 3 years. After age 85, women should have a CBE every year. Starting at age 63, women should consider having a mammography (breast X-ray test) every year. Women who have a family history of breast cancer should talk to their caregiver about genetic screening. Women at a high risk of breast cancer should talk to their caregivers about having magnetic resonance imaging (MRI) and a mammography every year.  The Pap test is a screening test for cervical cancer. A Pap test can show cell changes on the cervix that might become cervical cancer if left untreated. A Pap test is a procedure in which cells are obtained and examined from the lower end of the uterus (cervix).  Women should have a Pap test starting at age 62.  Between ages 94 and 32, Pap tests should be repeated every 2 years.  Beginning at age 35, you should have a Pap test every 3 years as long as the past 3 Pap tests have been normal.  Some women have medical problems that increase the chance of getting cervical cancer. Talk to your caregiver about these problems. It is especially important to talk to your caregiver if a new problem develops soon after your last Pap test. In these cases, your caregiver may recommend more frequent screening and Pap tests.  The above recommendations are the same for women who have  or have not gotten the vaccine for human papillomavirus (HPV).  If you had a hysterectomy for a problem that was not cancer or a condition that could lead to cancer, then you no longer need Pap tests. Even if you no longer need a Pap test, a regular exam is a good idea to make sure no other problems are starting.  If you are between ages 35 and 21, and you have had normal Pap tests going back 10 years, you no longer need Pap tests. Even if you no longer need a Pap test, a regular exam is a good idea to make sure no other problems are starting.  If you have had past treatment for cervical cancer or a condition that could lead to cancer, you need Pap tests and screening for cancer for at least 20 years after your treatment.  If Pap tests have been discontinued, risk factors (such as a new sexual partner) need to be reassessed to determine if  screening should be resumed.  The HPV test is an additional test that may be used for cervical cancer screening. The HPV test looks for the virus that can cause the cell changes on the cervix. The cells collected during the Pap test can be tested for HPV. The HPV test could be used to screen women aged 109 years and older, and should be used in women of any age who have unclear Pap test results. After the age of 53, women should have HPV testing at the same frequency as a Pap test.  Colorectal cancer can be detected and often prevented. Most routine colorectal cancer screening begins at the age of 37 and continues through age 31. However, your caregiver may recommend screening at an earlier age if you have risk factors for colon cancer. On a yearly basis, your caregiver may provide home test kits to check for hidden blood in the stool. Use of a small camera at the end of a tube, to directly examine the colon (sigmoidoscopy or colonoscopy), can detect the earliest forms of colorectal cancer. Talk to your caregiver about this at age 36, when routine screening begins.  Direct examination of the colon should be repeated every 5 to 10 years through age 42, unless early forms of pre-cancerous polyps or small growths are found.  Hepatitis C blood testing is recommended for all people born from [birth year] through 1965 and any individual with known risks for hepatitis C.  Practice safe sex. Use condoms and avoid high-risk sexual practices to reduce the spread of sexually transmitted infections (STIs). STIs include gonorrhea, chlamydia, syphilis, trichomonas, herpes, HPV, and human immunodeficiency virus (HIV). Herpes, HIV, and HPV are viral illnesses that have no cure. They can result in disability, cancer, and death. Sexually active women aged 67 and younger should be checked for chlamydia. Older women with new or multiple partners should also be tested for chlamydia. Testing for other STIs is recommended if you are sexually active and at increased risk.  Osteoporosis is a disease in which the bones lose minerals and strength with aging. This can result in serious bone fractures. The risk of osteoporosis can be identified using a bone density scan. Women ages 50 and over and women at risk for fractures or osteoporosis should discuss screening with their caregivers. Ask your caregiver whether you should take a calcium supplement or vitamin D to reduce the rate of osteoporosis.  Menopause can be associated with physical symptoms and risks. Hormone replacement therapy is available to decrease symptoms and risks. You should talk to your caregiver about whether hormone replacement therapy is right for you.  Use sunscreen with sun protection factor (SPF) of 30 or more. Apply sunscreen liberally and repeatedly throughout the day. You should seek shade when your shadow is shorter than you. Protect yourself by wearing long sleeves, pants, a wide-brimmed hat, and sunglasses year round, whenever you are outdoors.  Once a month, do a whole body skin exam, using a mirror to look at the skin  on your back. Notify your caregiver of new moles, moles that have irregular borders, moles that are larger than a pencil eraser, or moles that have changed in shape or color.  Stay current with required immunizations.  Influenza. You need a dose every fall (or winter). The composition of the flu vaccine changes each year, so being vaccinated once is not enough.  Pneumococcal polysaccharide. You need 1 to 2 doses if you smoke cigarettes or if you have certain chronic medical conditions.  You need 1 dose at age 37 (or older) if you have never been vaccinated.  Tetanus, diphtheria, pertussis (Tdap, Td). Get 1 dose of Tdap vaccine if you are younger than age 49, are over 30 and have contact with an infant, are a Research scientist (physical sciences), are pregnant, or simply want to be protected from whooping cough. After that, you need a Td booster dose every 10 years. Consult your caregiver if you have not had at least 3 tetanus and diphtheria-containing shots sometime in your life or have a deep or dirty wound.  HPV. You need this vaccine if you are a woman age 38 or younger. The vaccine is given in 3 doses over 6 months.  Measles, mumps, rubella (MMR). You need at least 1 dose of MMR if you were born in 1957 or later. You may also need a second dose.  Meningococcal. If you are age 68 to 75 and a first-year college student living in a residence hall, or have one of several medical conditions, you need to get vaccinated against meningococcal disease. You may also need additional booster doses.  Zoster (shingles). If you are age 21 or older, you should get this vaccine.  Varicella (chickenpox). If you have never had chickenpox or you were vaccinated but received only 1 dose, talk to your caregiver to find out if you need this vaccine.  Hepatitis A. You need this vaccine if you have a specific risk factor for hepatitis A virus infection or you simply wish to be protected from this disease. The vaccine is usually given as  2 doses, 6 to 18 months apart.  Hepatitis B. You need this vaccine if you have a specific risk factor for hepatitis B virus infection or you simply wish to be protected from this disease. The vaccine is given in 3 doses, usually over 6 months. Preventive Services / Frequency Ages 15 and over  Blood pressure check.** / Every 1 to 2 years.  Lipid and cholesterol check.** / Every 5 years beginning at age 72.  Clinical breast exam.** / Every year after age 55.  Mammogram.** / Every year beginning at age 55 and continuing for as long as you are in good health. Consult with your caregiver.  Pap test.** / Every 3 years starting at age 4 through age 15 or 55 with a 3 consecutive normal Pap tests. Testing can be stopped between 65 and 70 with 3 consecutive normal Pap tests and no abnormal Pap or HPV tests in the past 10 years.  HPV screening.** / Every 3 years from ages 64 through ages 32 or 20 with a history of 3 consecutive normal Pap tests. Testing can be stopped between 65 and 70 with 3 consecutive normal Pap tests and no abnormal Pap or HPV tests in the past 10 years.  Fecal occult blood test (FOBT) of stool. / Every year beginning at age 66 and continuing until age 24. You may not need to do this test if you get a colonoscopy every 10 years.  Flexible sigmoidoscopy or colonoscopy.** / Every 5 years for a flexible sigmoidoscopy or every 10 years for a colonoscopy beginning at age 63 and continuing until age 66.  Hepatitis C blood test.** / For all people born from [birth year] through 1966 and any individual with known risks for hepatitis C.  Osteoporosis screening.** / A one-time screening for women ages 29 and over and women at risk for fractures or osteoporosis.  Skin self-exam. / Monthly.  Influenza immunization.** / Every  year.  Pneumococcal polysaccharide immunization.** / 1 dose at age 39 (or older) if you have never been vaccinated.  Tetanus, diphtheria, pertussis (Tdap, Td)  immunization. / A one-time dose of Tdap vaccine if you are over 65 and have contact with an infant, are a Research scientist (physical sciences), or simply want to be protected from whooping cough. After that, you need a Td booster dose every 10 years.  Varicella immunization.** / Consult your caregiver.  Meningococcal immunization.** / Consult your caregiver.  Hepatitis A immunization.** / Consult your caregiver. 2 doses, 6 to 18 months apart.  Hepatitis B immunization.** / Check with your caregiver. 3 doses, usually over 6 months. ** Family history and personal history of risk and conditions may change your caregiver's recommendations. Document Released: 03/29/2001 Document Revised: 04/25/2011 Document Reviewed: 06/28/2010 [facility] Patient Information 2013 [place], [place].

## 2012-01-01 ENCOUNTER — Encounter: Payer: Self-pay | Admitting: Internal Medicine

## 2012-01-02 ENCOUNTER — Ambulatory Visit (INDEPENDENT_AMBULATORY_CARE_PROVIDER_SITE_OTHER)
Admission: RE | Admit: 2012-01-02 | Discharge: 2012-01-02 | Disposition: A | Discharge status: Home / Self Care | Payer: Medicare Other | Source: Ambulatory Visit | Attending: Internal Medicine | Admitting: Internal Medicine

## 2012-01-02 DIAGNOSIS — Z23 Encounter for immunization: Secondary | ICD-10-CM

## 2012-01-02 DIAGNOSIS — G479 Sleep disorder, unspecified: Secondary | ICD-10-CM

## 2012-01-02 DIAGNOSIS — Z13220 Encounter for screening for lipoid disorders: Secondary | ICD-10-CM

## 2012-01-02 DIAGNOSIS — M899 Disorder of bone, unspecified: Secondary | ICD-10-CM

## 2012-01-02 DIAGNOSIS — Z1211 Encounter for screening for malignant neoplasm of colon: Secondary | ICD-10-CM

## 2012-01-02 DIAGNOSIS — Z79899 Other long term (current) drug therapy: Secondary | ICD-10-CM

## 2012-01-02 DIAGNOSIS — M1990 Unspecified osteoarthritis, unspecified site: Secondary | ICD-10-CM

## 2012-01-02 DIAGNOSIS — M5430 Sciatica, unspecified side: Secondary | ICD-10-CM

## 2012-01-02 DIAGNOSIS — I10 Essential (primary) hypertension: Secondary | ICD-10-CM

## 2012-01-02 DIAGNOSIS — Z0000 Encounter for general adult medical examination without abnormal findings: Secondary | ICD-10-CM

## 2012-01-02 DIAGNOSIS — M5432 Sciatica, left side: Secondary | ICD-10-CM

## 2012-01-02 DIAGNOSIS — I8390 Asymptomatic varicose veins of unspecified lower extremity: Secondary | ICD-10-CM

## 2012-01-02 DIAGNOSIS — M25559 Pain in unspecified hip: Secondary | ICD-10-CM

## 2012-03-06 ENCOUNTER — Telehealth: Payer: Self-pay | Admitting: Family Medicine

## 2012-03-06 NOTE — Telephone Encounter (Signed)
The pt called and left a message on my voicemail saying that she continues to have problems with her back.  She would like to see a specialist.

## 2012-03-07 ENCOUNTER — Encounter: Payer: Self-pay | Admitting: Internal Medicine

## 2012-03-07 ENCOUNTER — Ambulatory Visit (INDEPENDENT_AMBULATORY_CARE_PROVIDER_SITE_OTHER): Payer: Medicare Other | Admitting: Internal Medicine

## 2012-03-07 VITALS — BP 116/70 | HR 88 | Temp 97.8°F | Wt 132.0 lb

## 2012-03-07 DIAGNOSIS — M479 Spondylosis, unspecified: Secondary | ICD-10-CM

## 2012-03-07 DIAGNOSIS — M48061 Spinal stenosis, lumbar region without neurogenic claudication: Secondary | ICD-10-CM

## 2012-03-07 DIAGNOSIS — M549 Dorsalgia, unspecified: Secondary | ICD-10-CM

## 2012-03-07 NOTE — Telephone Encounter (Signed)
Patient seen in the office today.

## 2012-03-07 NOTE — Patient Instructions (Signed)
Consider seeing a physiatrist   Physical medicine doctor   . Since you situation  Seems to not be  surgical at this time.      Elsner.Dr  For second  Opinion is a     NS,.      Also can consider  Sports  Medicine.    Dr Doristine Church [facility] .

## 2012-03-07 NOTE — Progress Notes (Signed)
Chief Complaint  Patient presents with  . Back Pain    Has been to see a specialist.    HPI: Patient comes in today for SDA for   problem advice . Has had some sciatic off and on for a while ; has recnetly seen specialist for back specialist and asked for advice about next 3 to go. She saw a back surgeon at [facility] referred from her knee surgery and [facility] had [facility].   And felt it was  Moderate spinal stenosis and not severe and did not suggest what she should be doing to help her function and pain ? what to do .    Referred for PT   upon request and only went once    30 minutes visit.  Still gets Sharp catches left back when getting  Right.  Was told there is" No therapy for stenosis."   Hx of pt in past for sciatica. That physical therapist and doesn't take Medicare.  Till has central pain when walks.  When standing. ? Get another  opinion doesn't really want surgery but is unsure what she should do MRI showed multilevel disc degenerative disease.  ROS: See pertinent positives and negatives per HPI.  Past Medical History  Diagnosis Date  . Allergic rhinitis   . GERD (gastroesophageal reflux disease)   . Osteoarthritis   . Heart murmur   . Cervical back pain with evidence of disc disease   . Nerve damage     left arm  . Transient global amnesia   . Abnormal LFTs   . Phlebitis   . Spinal stenosis   . Osteopenia   . Hypertension   . Abdominal  pain, other specified site     Family History  Problem Relation Age of Onset  . COPD Mother   . Osteoporosis Mother   . Heart disease Mother   . Hypertension Brother   . Hypertension Father   . Alcohol abuse Father   . Lung cancer Father   . Prostate cancer Maternal Grandfather   . Cancer Maternal Grandmother     sinus    History   Social History  . Marital Status: Married    Spouse Name: N/A    Number of Children: 2  . Years of Education: N/A   Occupational History  . retired Runner, broadcasting/film/video   .     Social History Main  Topics  . Smoking status: Never Smoker   . Smokeless tobacco: Never Used  . Alcohol Use: 8.4 oz/week    14 Glasses of wine per week  . Drug Use: None  . Sexually Active: None   Other Topics Concern  . 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  [place] exercise aerobics walking elliptical     Outpatient Encounter Prescriptions as of 03/07/2012  Medication Sig Dispense Refill  . acetaminophen (TYLENOL) 500 MG tablet Take 1,000 mg by mouth every 6 (six) hours as needed.        Marland Kitchen aspirin 81 MG tablet Take 81 mg by mouth daily.        . celecoxib (CELEBREX) 100 MG capsule Take 100 mg by mouth daily.      . Cholecalciferol (VITAMIN D PO) Take by mouth.      . clorazepate (TRANXENE) 7.5 MG tablet Take 1 tablet (7.5 mg total) by mouth at bedtime.  30 tablet  5  . cyclobenzaprine (FLEXERIL)  10 MG tablet Take 10 mg by mouth as directed.        . lidocaine (LIDODERM) 5 % Place 1 patch onto the skin daily. Remove & Discard patch within 12 hours or as directed by MD       . lisinopril (PRINIVIL,ZESTRIL) 20 MG tablet TAKE ONE TABLET BY MOUTH DAILY.  90 tablet  0  . polyethylene glycol powder (MIRALAX) powder Take 17 g by mouth as directed.        . sennosides-docusate sodium (SENOKOT-S) 8.6-50 MG tablet Take 1 tablet by mouth daily.        . traMADol (ULTRAM) 50 MG tablet Take 50 mg by mouth.      . [DISCONTINUED] fluticasone (FLONASE) 50 MCG/ACT nasal spray Place 2 sprays into the nose daily.        . [DISCONTINUED] methocarbamol (ROBAXIN) 500 MG tablet Take 500 mg by mouth as directed.          EXAM:  BP 116/70  Pulse 88  Temp 97.8 F (36.6 C) (Oral)  Wt 132 lb (59.875 kg)  SpO2 98%  There is no height on file to calculate BMI.  GENERAL: vitals reviewed and listed above, alert, oriented, appears well hydrated and in no acute distress  Reviewed what information she had. No note from  physician in her possession.  PSYCH: pleasant and cooperative, no obvious depression or anxiety  ASSESSMENT AND PLAN:  Discussed the following assessment and plan:  1. Degenerative joint disease of spine multilevel    Last MRI done at [facility]  2. Back pain   3. Spinal stenosis, lumbar    discussed options  -Pat management what activities she should be doing to help avoid progression and managed the discomfort. She does try to stay physically active. We discussed seeing a physical medicine physician an appropriate therapy and even sports medicine Dr. fields at all. If she needs a second opinion would have her see Dr. Danielle Dess she has been seen in our practice a few years ago by another physician and contact them and then [place] if we need to facilitate for evaluation.  Also discussed difficulties was given insurance programs and network physicians may be limited.  advised to return or notify health care team  immediately if symptoms worsen or persist or new concerns arise.  Patient Instructions  Consider seeing a physiatrist   Physical medicine doctor   . Since you situation  Seems to not be  surgical at this time.      Elsner.Dr  For second  Opinion is a     NS,.      Also can consider  Sports  Medicine.    Dr Doristine Church [facility] .    Neta Mends. Panosh M.D. Total visit > 50% spent counseling and coordinating care

## 2012-03-21 ENCOUNTER — Telehealth: Payer: Self-pay | Admitting: Internal Medicine

## 2012-03-21 NOTE — Telephone Encounter (Signed)
Patient Information:  Caller Name: Ramonita Koenig  Phone: [phone]  Patient: Briana Giles, Briana Giles  Gender: Female  DOB: August 07, 1945  Age: 67 Years  PCP: Berniece Andreas (Family Practice)  Office Follow Up:  Does the office need to follow up with this patient?: No  Instructions For The Office: N/A  RN Note:  Onset of sore throat 03/16/12 but seemed to worsen 03/21/12.  New chest tightness and cough.  Afebrile.  States throat is no longer painful, but her voice is completely hoarse.  Per cough and sore throat protocols, advised appt today or tomorrow.  Appt scheduled first available 03/22/12 0915 with Dr. Fabian Sharp.  krs/can  Symptoms  Reason For Call & Symptoms: laryngitis, sore throat, cough  Reviewed Health History In EMR: Yes  Reviewed Medications In EMR: Yes  Reviewed Allergies In EMR: Yes  Reviewed Surgeries / Procedures: Yes  Date of Onset of Symptoms: 03/16/2012  Guideline(s) Used:  Cough  Sore Throat  Disposition Per Guideline:   Strep Test Only Visit Today or Tomorrow  Reason For Disposition Reached:   Sore throat with cough/cold symptoms present > 5 days  Advice Given:  N/A  Appointment Scheduled:  03/22/2012 09:15:00 Appointment Scheduled Provider:  Berniece Andreas (Family Practice)

## 2012-03-22 ENCOUNTER — Ambulatory Visit: Payer: Self-pay | Admitting: Internal Medicine

## 2012-03-28 ENCOUNTER — Other Ambulatory Visit: Payer: Self-pay | Admitting: Family Medicine

## 2012-03-28 MED ORDER — LISINOPRIL 20 MG PO TABS: ORAL_TABLET | ORAL | Status: DC

## 2012-04-03 ENCOUNTER — Telehealth: Payer: Self-pay | Admitting: Family Medicine

## 2012-04-03 NOTE — Telephone Encounter (Signed)
Ok to refill x 1 year 

## 2012-04-03 NOTE — Telephone Encounter (Signed)
The patient called and is asking for refills of a steroid nasal spray.  Found Flonase in her chart but looks like it was from 2012.  Nothing since then.  Please advise.  Thanks!!

## 2012-04-04 ENCOUNTER — Other Ambulatory Visit: Payer: Self-pay | Admitting: Internal Medicine

## 2012-04-04 DIAGNOSIS — G479 Sleep disorder, unspecified: Secondary | ICD-10-CM

## 2012-04-04 DIAGNOSIS — K219 Gastro-esophageal reflux disease without esophagitis: Secondary | ICD-10-CM

## 2012-04-04 DIAGNOSIS — Z13220 Encounter for screening for lipoid disorders: Secondary | ICD-10-CM

## 2012-04-04 DIAGNOSIS — I8390 Asymptomatic varicose veins of unspecified lower extremity: Secondary | ICD-10-CM

## 2012-04-04 DIAGNOSIS — I10 Essential (primary) hypertension: Secondary | ICD-10-CM

## 2012-04-04 DIAGNOSIS — M1990 Unspecified osteoarthritis, unspecified site: Secondary | ICD-10-CM

## 2012-04-04 DIAGNOSIS — J309 Allergic rhinitis, unspecified: Secondary | ICD-10-CM

## 2012-04-04 DIAGNOSIS — Z0000 Encounter for general adult medical examination without abnormal findings: Secondary | ICD-10-CM

## 2012-04-04 DIAGNOSIS — Z79899 Other long term (current) drug therapy: Secondary | ICD-10-CM

## 2012-04-04 MED ORDER — FLUTICASONE PROPIONATE 50 MCG/ACT NA SUSP: 2.0000 | Freq: Every day | NASAL | Status: DC

## 2012-04-04 NOTE — Telephone Encounter (Signed)
Patient notified to pick up at the [facility] on [address].

## 2012-06-29 ENCOUNTER — Other Ambulatory Visit: Payer: Self-pay | Admitting: Internal Medicine

## 2012-07-02 ENCOUNTER — Other Ambulatory Visit: Payer: Self-pay | Admitting: Family Medicine

## 2012-07-02 MED ORDER — CLORAZEPATE DIPOTASSIUM 7.5 MG PO TABS: ORAL_TABLET | ORAL | Status: DC

## 2012-08-14 ENCOUNTER — Other Ambulatory Visit: Payer: Self-pay | Admitting: Family Medicine

## 2012-08-14 MED ORDER — LISINOPRIL 20 MG PO TABS: ORAL_TABLET | ORAL | Status: DC

## 2012-10-09 ENCOUNTER — Encounter: Payer: Self-pay | Admitting: Internal Medicine

## 2012-10-09 ENCOUNTER — Ambulatory Visit (INDEPENDENT_AMBULATORY_CARE_PROVIDER_SITE_OTHER): Payer: Medicare (Managed Care) | Admitting: Internal Medicine

## 2012-10-09 VITALS — BP 126/76 | HR 78 | Temp 97.5°F | Wt 132.0 lb

## 2012-10-09 DIAGNOSIS — M479 Spondylosis, unspecified: Secondary | ICD-10-CM

## 2012-10-09 DIAGNOSIS — E875 Hyperkalemia: Secondary | ICD-10-CM

## 2012-10-09 DIAGNOSIS — I10 Essential (primary) hypertension: Secondary | ICD-10-CM

## 2012-10-09 DIAGNOSIS — Z791 Long term (current) use of non-steroidal anti-inflammatories (NSAID): Secondary | ICD-10-CM

## 2012-10-09 LAB — BASIC METABOLIC PANEL
BUN: 28 mg/dL — ABNORMAL HIGH (ref 6–23)
Creatinine, Ser: 0.8 mg/dL (ref 0.4–1.2)
GFR: 71.65 mL/min (ref >60.00)
Glucose, Bld: 88 mg/dL (ref 70–99)
Potassium: 4.3 mEq/L (ref 3.5–5.1)

## 2012-10-09 MED ORDER — LISINOPRIL-HYDROCHLOROTHIAZIDE 10-12.5 MG PO TABS: 1.0000 | ORAL_TABLET | Freq: Every day | ORAL | Status: DC

## 2012-10-09 NOTE — Patient Instructions (Addendum)
Make sure you are not taking potassium supplements. Check level  Of potassium today and if still elevated we should  Change your medication.  Can try   Lower dose with diuretic 10/12.5  and check levels in 2 weeks.,

## 2012-10-09 NOTE — Progress Notes (Signed)
Chief Complaint  Patient presents with  . Follow-up    HPI: Fu dr D where evaluation for djd etc  Noted labs nor except for 5.5 k and bun 25 and Cr 1.03  Has been on lisinopril  20 and no se and controls bp. No pot supp  Does take cox 2 nsaid. No arrythmias  Cp sob bleeding ROS: See pertinent positives and negatives per HPI. No cp sob cough  Syncope traveled to [place] and [place] this summer and did well.   Past Medical History  Diagnosis Date  . Allergic rhinitis   . GERD (gastroesophageal reflux disease)   . Osteoarthritis   . Heart murmur   . Cervical back pain with evidence of disc disease   . Nerve damage     left arm  . Transient global amnesia   . Abnormal LFTs   . Phlebitis   . Spinal stenosis   . Osteopenia   . Hypertension   . Abdominal  pain, other specified site     Family History  Problem Relation Age of Onset  . COPD Mother   . Osteoporosis Mother   . Heart disease Mother   . Hypertension Brother   . Hypertension Father   . Alcohol abuse Father   . Lung cancer Father   . Prostate cancer Maternal Grandfather   . Cancer Maternal Grandmother     sinus    History   Social History  . Marital Status: Married    Spouse Name: N/A    Number of Children: 2  . Years of Education: N/A   Occupational History  . retired Runner, broadcasting/film/video   .     Social History Main Topics  . Smoking status: Never Smoker   . Smokeless tobacco: Never Used  . Alcohol Use: 8.4 oz/week    14 Glasses of wine per week  . Drug Use: None  . Sexual Activity: None   Other Topics Concern  . 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  [place]   Does water exercise aerobics walking elliptical     Outpatient Encounter Prescriptions as of 10/09/2012  Medication Sig Dispense Refill  . acetaminophen (TYLENOL) 500 MG tablet Take 1,000 mg by mouth every 6 (six) hours as needed.        Marland Kitchen  aspirin 81 MG tablet Take 81 mg by mouth daily.        Marland Kitchen CALCIUM PO Take by mouth.      . celecoxib (CELEBREX) 100 MG capsule Take 100 mg by mouth daily.      . Cholecalciferol (VITAMIN D PO) Take by mouth.      . clorazepate (TRANXENE) 7.5 MG tablet TAKE ONE TABLET BY MOUTH AT BEDTIME  30 tablet  5  . cyclobenzaprine (FLEXERIL) 10 MG tablet Take 10 mg by mouth as directed.        . fluticasone (FLONASE) 50 MCG/ACT nasal spray Place 2 sprays into the nose daily.  16 g  11  . KRILL OIL PO Take by mouth.      . lidocaine (LIDODERM) 5 % Place 1 patch onto the skin daily. Remove & Discard patch within 12 hours or as directed by MD       . lisinopril (PRINIVIL,ZESTRIL) 20 MG tablet TAKE ONE TABLET BY MOUTH DAILY.  90 tablet  0  . polyethylene  glycol powder (MIRALAX) powder Take 17 g by mouth as directed.        . sennosides-docusate sodium (SENOKOT-S) 8.6-50 MG tablet Take 1 tablet by mouth daily.        . traMADol (ULTRAM) 50 MG tablet Take 50 mg by mouth.      . TURMERIC PO Take by mouth.      Marland Kitchen lisinopril-hydrochlorothiazide (PRINZIDE,ZESTORETIC) 10-12.5 MG per tablet Take 1 tablet by mouth daily.  90 tablet  1   No facility-administered encounter medications on file as of 10/09/2012.    EXAM:  BP 126/76  Pulse 78  Temp(Src) 97.5 F (36.4 C) (Oral)  Wt 132 lb (59.875 kg)  BMI 25.35 kg/m2  SpO2 98%  Body mass index is 25.35 kg/(m^2).  GENERAL: vitals reviewed and listed above, alert, oriented, appears well hydrated and in no acute distress HEENT: atraumatic, conjunctiva  clear, no obvious abnormalities on inspection of external nose and ears  PSYCH: pleasant and cooperative, no obvious depression or anxiety Reviewed la bs der D  Pot 5.5 range hx of some in past  ASSESSMENT AND PLAN:  Discussed the following assessment and plan:  Hyperpotassemia - Plan: TURMERIC PO, CALCIUM PO, KRILL OIL PO, Basic metabolic panel  HYPERTENSION - Plan: TURMERIC PO, CALCIUM PO, KRILL OIL PO, Basic  metabolic panel  Degenerative joint disease of spine multilevel  NSAID long-term use cox 2 Review of past levels trending high in past  Poss related to nsaid use also  Does very well for her bp and no se but if continue today non fasting then may need to either stop of alter to lower dose with diuretic .  No sx of such . Sign up for my chart .  rx given in case.    I dont know if the supplement she is taking causes elevation of Potassium but tpold to check bottles in case  -Patient advised to return or notify health care team  if symptoms worsen or persist or new concerns arise.  Patient Instructions  Make sure you are not taking potassium supplements. Check level  Of potassium today and if still elevated we should  Change your medication.  Can try   Lower dose with diuretic 10/12.5  and check levels in 2 weeks.,    Neta Mends. Scharlene Catalina M.D.  Addendum  Potassium is better now   Will continue and check labs at her visit in the fall to ensure stability.

## 2012-10-10 ENCOUNTER — Telehealth: Payer: Self-pay | Admitting: Family Medicine

## 2012-10-10 ENCOUNTER — Other Ambulatory Visit: Payer: Self-pay | Admitting: Family Medicine

## 2012-10-10 DIAGNOSIS — Z1211 Encounter for screening for malignant neoplasm of colon: Secondary | ICD-10-CM

## 2012-10-10 NOTE — Telephone Encounter (Signed)
Informed the pt of lab results.  Instructed her to come well hydrated for her next appt.  She does not have a future appt.  Checked last OV note.  Does not say when to come back.  Please advise when you would like to see the pt.  Thanks!

## 2012-10-10 NOTE — Telephone Encounter (Signed)
I see appt in November on the schedule   Please recheck and inform her.

## 2012-10-11 NOTE — Telephone Encounter (Signed)
Patient must have called back and made appt.

## 2012-10-14 ENCOUNTER — Other Ambulatory Visit: Payer: Self-pay | Admitting: Internal Medicine

## 2012-10-14 ENCOUNTER — Encounter: Payer: Self-pay | Admitting: Internal Medicine

## 2012-10-19 ENCOUNTER — Encounter: Payer: Self-pay | Admitting: Gastroenterology

## 2012-10-29 ENCOUNTER — Encounter: Payer: Self-pay | Admitting: Rheumatology

## 2012-11-25 ENCOUNTER — Other Ambulatory Visit: Payer: Self-pay | Admitting: Internal Medicine

## 2012-12-07 ENCOUNTER — Ambulatory Visit (AMBULATORY_SURGERY_CENTER): Payer: Self-pay

## 2012-12-07 VITALS — Ht 61.0 in | Wt 128.0 lb

## 2012-12-07 DIAGNOSIS — Z1211 Encounter for screening for malignant neoplasm of colon: Secondary | ICD-10-CM

## 2012-12-07 MED ORDER — MOVIPREP 100 G PO SOLR: 1.0000 | Freq: Once | ORAL | Status: DC

## 2012-12-10 ENCOUNTER — Encounter: Payer: Self-pay | Admitting: Gastroenterology

## 2012-12-20 ENCOUNTER — Other Ambulatory Visit: Payer: Self-pay

## 2012-12-28 ENCOUNTER — Encounter: Payer: Self-pay | Admitting: *Deleted

## 2012-12-28 ENCOUNTER — Encounter: Payer: Self-pay | Admitting: Gastroenterology

## 2012-12-28 ENCOUNTER — Ambulatory Visit (AMBULATORY_SURGERY_CENTER): Payer: Medicare Other | Admitting: Gastroenterology

## 2012-12-28 VITALS — BP 115/53 | HR 74 | Temp 98.1°F | Resp 17 | Ht 61.0 in | Wt 128.0 lb

## 2012-12-28 DIAGNOSIS — Z1211 Encounter for screening for malignant neoplasm of colon: Secondary | ICD-10-CM

## 2012-12-28 DIAGNOSIS — K648 Other hemorrhoids: Secondary | ICD-10-CM

## 2012-12-28 MED ORDER — SODIUM CHLORIDE 0.9 % IV SOLN: 500.0000 mL | INTRAVENOUS | Status: DC

## 2012-12-28 NOTE — Progress Notes (Signed)
Pressure applied to the abdomen to reach the cecum 

## 2012-12-28 NOTE — Progress Notes (Signed)
Patient did not experience any of the following events: a burn prior to discharge; a fall within the facility; wrong site/side/patient/procedure/implant event; or a hospital transfer or hospital admission upon discharge from the facility. (G8907) Patient did not have preoperative order for IV antibiotic SSI prophylaxis. (G8918)  

## 2012-12-28 NOTE — Progress Notes (Signed)
Patient denies any allergies to eggs or soy. 

## 2012-12-28 NOTE — Patient Instructions (Signed)
Discharge instructions given with verbal understanding. Normal exam. Resume previous medications. YOU HAD AN ENDOSCOPIC PROCEDURE TODAY AT THE [facility]: Refer to the procedure report that was given to you for any specific questions about what was found during the examination.  If the procedure report does not answer your questions, please call your gastroenterologist to clarify.  If you requested that your care partner not be given the details of your procedure findings, then the procedure report has been included in a sealed envelope for you to review at your convenience later.  YOU SHOULD EXPECT: Some feelings of bloating in the abdomen. Passage of more gas than usual.  Walking can help get rid of the air that was put into your GI tract during the procedure and reduce the bloating. If you had a lower endoscopy (such as a colonoscopy or flexible sigmoidoscopy) you may notice spotting of blood in your stool or on the toilet paper. If you underwent a bowel prep for your procedure, then you may not have a normal bowel movement for a few days.  DIET: Your first meal following the procedure should be a light meal and then it is ok to progress to your normal diet.  A half-sandwich or bowl of soup is an example of a good first meal.  Heavy or fried foods are harder to digest and may make you feel nauseous or bloated.  Likewise meals heavy in dairy and vegetables can cause extra gas to form and this can also increase the bloating.  Drink plenty of fluids but you should avoid alcoholic beverages for 24 hours.  ACTIVITY: Your care partner should take you home directly after the procedure.  You should plan to take it easy, moving slowly for the rest of the day.  You can resume normal activity the day after the procedure however you should NOT DRIVE or use heavy machinery for 24 hours (because of the sedation medicines used during the test).    SYMPTOMS TO REPORT IMMEDIATELY: A gastroenterologist  can be reached at any hour.  During normal business hours, 8:30 AM to 5:00 PM Monday through Friday, call [phone].  After hours and on weekends, please call the GI answering service at [phone] who will take a message and have the physician on call contact you.   Following lower endoscopy (colonoscopy or flexible sigmoidoscopy):  Excessive amounts of blood in the stool  Significant tenderness or worsening of abdominal pains  Swelling of the abdomen that is new, acute  Fever of 100F or higher  FOLLOW UP: If any biopsies were taken you will be contacted by phone or by letter within the next 1-3 weeks.  Call your gastroenterologist if you have not heard about the biopsies in 3 weeks.  Our staff will call the home number listed on your records the next business day following your procedure to check on you and address any questions or concerns that you may have at that time regarding the information given to you following your procedure. This is a courtesy call and so if there is no answer at the home number and we have not heard from you through the emergency physician on call, we will assume that you have returned to your regular daily activities without incident.  SIGNATURES/CONFIDENTIALITY: You and/or your care partner have signed paperwork which will be entered into your electronic medical record.  These signatures attest to the fact that that the information above on your After Visit Summary has been reviewed   and is understood.  Full responsibility of the confidentiality of this discharge information lies with you and/or your care-partner. 

## 2012-12-28 NOTE — Op Note (Signed)
[facility] [address] [place], [address]   COLONOSCOPY PROCEDURE REPORT  PATIENT: Briana Giles, Briana Giles  MR#: [id number] BIRTHDATE: December 24, 1945 , 67  yrs. old GENDER: Female ENDOSCOPIST: Rachael Fee, MD PROCEDURE DATE:  12/28/2012 PROCEDURE:   Colonoscopy, diagnostic First Screening Colonoscopy - Avg.  risk and is 50 yrs.  old or older - No.  Prior Negative Screening - Now for repeat screening. 10 or more years since last screening  History of Adenoma - Now for follow-up colonoscopy & has been > or = to 3 yrs.  N/A  Polyps Removed Today? No.  Recommend repeat exam, <10 yrs? No. ASA CLASS:   Class II INDICATIONS:average risk screening. MEDICATIONS: Fentanyl 100 mcg IV, Versed 10 mg IV, and These medications were titrated to patient response per physician's verbal order  DESCRIPTION OF PROCEDURE:   After the risks benefits and alternatives of the procedure were thoroughly explained, informed consent was obtained.  A digital rectal exam revealed no abnormalities of the rectum.   The Pentax Ped Colon [id number] endoscope was introduced through the anus and advanced to the cecum, which was identified by both the appendix and ileocecal valve. No adverse events experienced.   The quality of the prep was good.  The instrument was then slowly withdrawn as the colon was fully examined.   COLON FINDINGS: A normal appearing cecum, ileocecal valve, and appendiceal orifice were identified.  The ascending, hepatic flexure, transverse, splenic flexure, descending, sigmoid colon and rectum appeared unremarkable.  No polyps or cancers were seen. Retroflexed views revealed internal hemorrhoids. The time to cecum=6 minutes 42 seconds.  Withdrawal time=10 minutes 31 seconds. The scope was withdrawn and the procedure completed. COMPLICATIONS: There were no complications.  ENDOSCOPIC IMPRESSION: Normal colon except for small internal hemorrhoids. No polyps or  cancers  RECOMMENDATIONS: You should continue to follow colorectal cancer screening guidelines for "routine risk" patients with a repeat colonoscopy in 10 years. There is no need for FOBT (stool) testing for at least 5 years.   eSigned:  Rachael Fee, MD 12/28/2012 11:07 AM   cc: Berniece Andreas, MD

## 2012-12-31 ENCOUNTER — Ambulatory Visit (INDEPENDENT_AMBULATORY_CARE_PROVIDER_SITE_OTHER): Payer: Medicare Other | Admitting: Internal Medicine

## 2012-12-31 ENCOUNTER — Encounter: Payer: Self-pay | Admitting: Internal Medicine

## 2012-12-31 ENCOUNTER — Telehealth: Payer: Self-pay

## 2012-12-31 VITALS — BP 108/64 | HR 77 | Temp 97.9°F | Ht 60.25 in | Wt 131.0 lb

## 2012-12-31 DIAGNOSIS — G479 Sleep disorder, unspecified: Secondary | ICD-10-CM

## 2012-12-31 DIAGNOSIS — M479 Spondylosis, unspecified: Secondary | ICD-10-CM

## 2012-12-31 DIAGNOSIS — M48061 Spinal stenosis, lumbar region without neurogenic claudication: Secondary | ICD-10-CM

## 2012-12-31 DIAGNOSIS — Z79899 Other long term (current) drug therapy: Secondary | ICD-10-CM

## 2012-12-31 DIAGNOSIS — R0989 Other specified symptoms and signs involving the circulatory and respiratory systems: Secondary | ICD-10-CM

## 2012-12-31 DIAGNOSIS — R011 Cardiac murmur, unspecified: Secondary | ICD-10-CM

## 2012-12-31 DIAGNOSIS — M069 Rheumatoid arthritis, unspecified: Secondary | ICD-10-CM | POA: Insufficient documentation

## 2012-12-31 DIAGNOSIS — M1990 Unspecified osteoarthritis, unspecified site: Secondary | ICD-10-CM

## 2012-12-31 DIAGNOSIS — Z0000 Encounter for general adult medical examination without abnormal findings: Secondary | ICD-10-CM | POA: Insufficient documentation

## 2012-12-31 DIAGNOSIS — I10 Essential (primary) hypertension: Secondary | ICD-10-CM

## 2012-12-31 LAB — BASIC METABOLIC PANEL
CO2: 28 mEq/L (ref 19–32)
Calcium: 9.5 mg/dL (ref 8.4–10.5)
Chloride: 104 mEq/L (ref 96–112)
Creatinine, Ser: 0.8 mg/dL (ref 0.4–1.2)
Glucose, Bld: 85 mg/dL (ref 70–99)
Potassium: 5.2 mEq/L — ABNORMAL HIGH (ref 3.5–5.1)

## 2012-12-31 LAB — CBC WITH DIFFERENTIAL/PLATELET
Basophils Absolute: 0 10*3/uL (ref 0.0–0.1)
Basophils Relative: 0.6 % (ref 0.0–3.0)
Eosinophils Absolute: 0.3 10*3/uL (ref 0.0–0.7)
HCT: 38.8 % (ref 36.0–46.0)
Hemoglobin: 13.2 g/dL (ref 12.0–15.0)
Lymphocytes Relative: 24.2 % (ref 12.0–46.0)
MCHC: 34 g/dL (ref 30.0–36.0)
MCV: 91.8 fl (ref 78.0–100.0)
Monocytes Absolute: 0.4 10*3/uL (ref 0.1–1.0)
Neutro Abs: 2.8 10*3/uL (ref 1.4–7.7)
Neutrophils Relative %: 60.8 % (ref 43.0–77.0)
RBC: 4.23 Mil/uL (ref 3.87–5.11)
RDW: 13.5 % (ref 11.5–14.6)

## 2012-12-31 LAB — LIPID PANEL
HDL: 61.5 mg/dL (ref >39.00)
LDL Cholesterol: 109 mg/dL — ABNORMAL HIGH (ref 0–99)
Total CHOL/HDL Ratio: 3
Triglycerides: 58 mg/dL (ref 0.0–149.0)
VLDL: 11.6 mg/dL (ref 0.0–40.0)

## 2012-12-31 LAB — HEPATIC FUNCTION PANEL
Albumin: 4.3 g/dL (ref 3.5–5.2)
Bilirubin, Direct: 0 mg/dL (ref 0.0–0.3)
Total Protein: 7 g/dL (ref 6.0–8.3)

## 2012-12-31 MED ORDER — CLORAZEPATE DIPOTASSIUM 7.5 MG PO TABS: ORAL_TABLET | ORAL | Status: DC

## 2012-12-31 NOTE — Patient Instructions (Signed)
Do not take alcohol with chlorazepate  . There is risk of decrease breathing in sleep. Will notify you  of labs when available. Will look in to recoird abvout the abd bruit  . We may orther an abdominal  ultraound doppler toest to make sure no obstruction of flow.  Will send labs to dr D again.  ROV in 6 months depending on results .

## 2012-12-31 NOTE — Progress Notes (Signed)
Chief Complaint  Patient presents with  . Medicare Wellness    HPI: Patient comes in today for Preventive Medicare wellness visit . No major injuries, ed visits ,hospitalizations , new medications since last visit. On plaquenil  And some RA  Changes on [place]. Began about  A month ago.  No change yet.  livier enzymes . Mildly abnormal at the rheumatologist office tramadol for pain at times   Hearing: ok  Vision:  No limitations at present . Last eye check UTD  Safety:  Has smoke detector and wears seat belts.  No firearms. No excess sun exposure. Sees dentist regularly.  Falls: no  Advance directive :  Reviewed    Memory: Felt to be good  ,hard to remember names  no concern from her or her family.  Depression: No anhedonia unusual crying or depressive symptoms  Nutrition: Eats well balanced diet; adequate calcium and vitamin D. No swallowing chewing problems.  Injury: no major injuries in the last six months.  Other healthcare providers:  Reviewed today .  Social:  Lives with spouse married.  pets. 3 cats   Preventive parameters: up-to-date  Reviewed   ADLS:   There are no problems or need for assistance  driving, feeding, obtaining food, dressing, toileting and bathing, managing money using phone. She is independent.  EXERCISE/ HABITS  Silver snakers yoga 3 x and warm water aerobic  Per week   No tobacco    etoh 1-2 per day.1-2 caffiene some or less.    ROS:  GEN/ HEENT: No fever, significant weight changes sweats headaches vision problems hearing changes, CV/ PULM; No chest pain shortness of breath cough, syncope,edema  change in exercise tolerance. GI /GU: No adominal pain, vomiting, change in bowel habits. No blood in the stool. No significant GU symptoms. SKIN/HEME: ,no acute skin rashes suspicious lesions or bleeding. No lymphadenopathy, nodules, masses.  NEURO/ PSYCH:  No neurologic signs such as weakness numbness. No depression anxiety. IMM/ Allergy: No unusual  infections.  Allergy .   REST of 12 system review negative except as per HPI   Past Medical History  Diagnosis Date  . Allergic rhinitis   . GERD (gastroesophageal reflux disease)   . Osteoarthritis   . Heart murmur   . Cervical back pain with evidence of disc disease   . Nerve damage     left arm  . Transient global amnesia   . Abnormal LFTs   . Phlebitis   . Spinal stenosis   . Osteopenia   . Hypertension   . Abdominal  pain, other specified site     Family History  Problem Relation Age of Onset  . COPD Mother   . Osteoporosis Mother   . Heart disease Mother   . Hypertension Brother   . Hypertension Father   . Alcohol abuse Father   . Lung cancer Father   . Prostate cancer Maternal Grandfather   . Cancer Maternal Grandmother     sinus  . Colon cancer Neg Hx   . Pancreatic cancer Neg Hx   . Stomach cancer Neg Hx   . Valvular heart disease Brother     History   Social History  . Marital Status: Married    Spouse Name: N/A    Number of Children: 2  . Years of Education: N/A   Occupational History  . retired Runner, broadcasting/film/video   .     Social History Main Topics  . Smoking status: Never Smoker   . Smokeless tobacco:  Never Used  . Alcohol Use: 8.4 oz/week    14 Glasses of wine per week  . Drug Use: None  . Sexual Activity: None   Other Topics Concern  . 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  [place]   Does water exercise aerobics walking elliptical     Outpatient Encounter Prescriptions as of 12/31/2012  Medication Sig  . acetaminophen (TYLENOL) 500 MG tablet Take 1,000 mg by mouth every 6 (six) hours as needed.    Marland Kitchen aspirin 81 MG tablet Take 81 mg by mouth daily.    Marland Kitchen CALCIUM PO Take by mouth.  . celecoxib (CELEBREX) 100 MG capsule Take 100 mg by mouth daily.  . Cholecalciferol (VITAMIN D PO) Take by mouth.  . clorazepate (TRANXENE) 7.5 MG tablet TAKE  ONE TABLET BY MOUTH AT BEDTIME  . cyclobenzaprine (FLEXERIL) 10 MG tablet Take 10 mg by mouth as directed.    . fluticasone (FLONASE) 50 MCG/ACT nasal spray Place 2 sprays into the nose daily.  . hydroxychloroquine (PLAQUENIL) 200 MG tablet Take 1 tablet by mouth 2 (two) times daily.  Marland Kitchen KRILL OIL PO Take by mouth.  . lidocaine (LIDODERM) 5 % Place 1 patch onto the skin daily. Remove & Discard patch within 12 hours or as directed by MD   . lisinopril (PRINIVIL,ZESTRIL) 20 MG tablet TAKE ONE TABLET BY MOUTH DAILY.  Marland Kitchen polyethylene glycol powder (MIRALAX) powder Take 17 g by mouth as directed.    . sennosides-docusate sodium (SENOKOT-S) 8.6-50 MG tablet Take 1 tablet by mouth daily.    . traMADol (ULTRAM) 50 MG tablet Take 50 mg by mouth.  . TURMERIC PO Take by mouth.  . [DISCONTINUED] clorazepate (TRANXENE) 7.5 MG tablet TAKE ONE TABLET BY MOUTH AT BEDTIME    EXAM:  BP 108/64  Pulse 77  Temp(Src) 97.9 F (36.6 C) (Oral)  Ht 5' 0.25" (1.53 m)  Wt 131 lb (59.421 kg)  BMI 25.38 kg/m2  SpO2 98%  Body mass index is 25.38 kg/(m^2).  Physical Exam: Vital signs reviewed GNF:AOZH is a well-developed well-nourished alert cooperative   who appears stated age in no acute distress.  HEENT: normocephalic atraumatic , Eyes: PERRL EOM's full, conjunctiva clear, Nares: paten,t no deformity discharge or tenderness., Ears: no deformity EAC's clear TMs with normal landmarks. Mouth: clear OP, no lesions, edema.  Moist mucous membranes. Dentition in adequate repair. NECK: supple without masses, thyromegaly or bruits. CHEST/PULM:  Clear to auscultation and percussion breath sounds equal no wheeze , rales or rhonchi. No chest wall deformities or tenderness. Breast: normal by inspection . No dimpling, discharge, masses, tenderness or discharge . CV: PMI is nondisplaced, S1 S2 no gallops, rubs.  Short sem 1-2 /6 lusb no radationsPeripheral pulses are full without delay.No JVD .  ABDOMEN: Bowel sounds normal  nontender  No guard or rebound, no hepato splenomegal no CVA tenderness.  No hernia. 2+ systolic abd bruit ;diastole clear and no masses  Extremtities:  No clubbing cyanosis or edema, no acute joint swelling or redness no focal atrophy NEURO:  Oriented x3, cranial nerves 3-12 appear to be intact, no obvious focal weakness,gait within normal limits no abnormal reflexes or asymmetrical SKIN: No acute rashes normal turgor, color, no bruising or petechiae. PSYCH: Oriented, good eye contact, no obvious depression anxiety, cognition and judgment appear normal. LN: no cervical axillary  inguinal adenopathy No noted deficits in memory, attention, and speech.   ASSESSMENT AND PLAN:  Discussed the following assessment and plan:  Encounter for Medicare annual wellness exam - Plan: Basic metabolic panel, CBC with Differential, Hepatic function panel, Lipid panel, TSH  HYPERTENSION - Plan: Basic metabolic panel, CBC with Differential, Hepatic function panel, Lipid panel, TSH  OSTEOARTHRITIS - Plan: Basic metabolic panel, CBC with Differential, Hepatic function panel, Lipid panel, TSH  Abdominal bruit - Plan: Basic metabolic panel, CBC with Differential, Hepatic function panel, Lipid panel, TSH  Visit for preventive health examination - Plan: Basic metabolic panel, CBC with Differential, Hepatic function panel, Lipid panel, TSH  SYSTOLIC MURMUR  Arthritis, rheumatoid - Recently diagnosed by Dr. Algis Downs. ultrasounds were abnormal serologies negative  on plaquinil followup 6 months  Sleep disturbance, unspecified  High risk medication use - Discussed risk of using alcohol and clorazepate she should not use these in the same evening  Degenerative joint disease of spine  Spinal stenosis, lumbar  Patient Care Team: Madelin Headings, MD as PCP - General Lenn Sink, DPM (Podiatry) Susy Frizzle, MD (Rheumatology) Nadara Mustard, MD (Orthopedic Surgery) Rachael Fee, MD (Gastroenterology) Elmon Else, MD as Attending Physician (Dermatology) Elliot Dally as Referring Physician (Orthopedic Surgery) Nadara Mustard, MD as Attending Physician (Orthopedic Surgery)  Patient Instructions  Do not take alcohol with chlorazepate  . There is risk of decrease breathing in sleep. Will notify you  of labs when available. Will look in to recoird abvout the abd bruit  . We may orther an abdominal  ultraound doppler toest to make sure no obstruction of flow.  Will send labs to dr D again.  ROV in 6 months depending on results .     Neta Mends. Panosh M.D.  Health Maintenance  Topic Date Due  . Mammogram  03/13/2013  . Influenza Vaccine  09/14/2013  . Tetanus/tdap  05/28/2018  . Colonoscopy  12/29/2022  . Pneumococcal Polysaccharide Vaccine Age 74 And Over  Completed  . Zostavax  Completed   Health Maintenance Review     reviewed record   abd [place] nlr aorta 2009 but no vascular doppler done Will plan on doing doppler of abd arteries.

## 2012-12-31 NOTE — Telephone Encounter (Signed)
  Follow up Call-  Call back number 12/28/2012 06/16/2010  Post procedure Call Back phone  # [phone]  Permission to leave phone message Yes -     Patient questions:  Do you have a fever, pain , or abdominal swelling? no Pain Score  0 *  Have you tolerated food without any problems? yes  Have you been able to return to your normal activities? yes  Do you have any questions about your discharge instructions: Diet   no Medications  no Follow up visit  no  Do you have questions or concerns about your Care? no  Actions: * If pain score is 4 or above: No action needed, pain <4.

## 2013-01-09 ENCOUNTER — Other Ambulatory Visit: Payer: Self-pay | Admitting: Internal Medicine

## 2013-01-21 ENCOUNTER — Other Ambulatory Visit: Payer: Self-pay | Admitting: Family Medicine

## 2013-01-23 ENCOUNTER — Telehealth: Payer: Self-pay | Admitting: Family Medicine

## 2013-01-23 ENCOUNTER — Other Ambulatory Visit: Payer: Self-pay | Admitting: Family Medicine

## 2013-01-23 DIAGNOSIS — R0989 Other specified symptoms and signs involving the circulatory and respiratory systems: Secondary | ICD-10-CM

## 2013-01-23 NOTE — Telephone Encounter (Signed)
Sounds right to me  Thanks for  Calling vascular lab.

## 2013-01-23 NOTE — Telephone Encounter (Signed)
Please review the orders for vascular study placed in the system by me.  I had Suandrea call the heart center for advise about test ordering. Make sure this is what you want.  Thanks!

## 2013-01-31 ENCOUNTER — Telehealth: Payer: Self-pay | Admitting: Family Medicine

## 2013-01-31 ENCOUNTER — Encounter (HOSPITAL_COMMUNITY): Payer: Medicare Other

## 2013-01-31 ENCOUNTER — Ambulatory Visit (HOSPITAL_COMMUNITY): Payer: Medicare Other | Attending: Internal Medicine

## 2013-01-31 ENCOUNTER — Other Ambulatory Visit: Payer: Self-pay | Admitting: Family Medicine

## 2013-01-31 DIAGNOSIS — R0989 Other specified symptoms and signs involving the circulatory and respiratory systems: Secondary | ICD-10-CM

## 2013-01-31 DIAGNOSIS — I700 Atherosclerosis of aorta: Secondary | ICD-10-CM

## 2013-01-31 NOTE — Telephone Encounter (Signed)
The patient called to request refills of Flonase (fluticasone).  Left message on my voicemail.  According to EMR the pt should have refills at [facility].  Spoke to Pharmacologist at [facility].  Pt does have refills.  Asked technician to fill and have ready for the pt.

## 2013-02-05 ENCOUNTER — Encounter (HOSPITAL_COMMUNITY): Payer: Medicare Other

## 2013-02-18 ENCOUNTER — Other Ambulatory Visit: Payer: Self-pay | Admitting: Internal Medicine

## 2013-02-20 ENCOUNTER — Other Ambulatory Visit: Payer: Self-pay | Admitting: Internal Medicine

## 2013-02-22 ENCOUNTER — Encounter: Payer: Self-pay | Admitting: Internal Medicine

## 2013-02-22 ENCOUNTER — Ambulatory Visit (INDEPENDENT_AMBULATORY_CARE_PROVIDER_SITE_OTHER): Payer: Medicare Other | Admitting: Internal Medicine

## 2013-02-22 VITALS — BP 132/80 | Temp 97.9°F | Wt 129.0 lb

## 2013-02-22 DIAGNOSIS — K551 Chronic vascular disorders of intestine: Secondary | ICD-10-CM | POA: Insufficient documentation

## 2013-02-22 DIAGNOSIS — J069 Acute upper respiratory infection, unspecified: Secondary | ICD-10-CM

## 2013-02-22 DIAGNOSIS — I771 Stricture of artery: Principal | ICD-10-CM

## 2013-02-22 NOTE — Progress Notes (Signed)
Pre visit review using our clinic review tool, if applicable. No additional management support is needed unless otherwise documented below in the visit note. Chief Complaint  Patient presents with  . Follow-up    Results of ultrasound.  Also complains of a "cold."  Was seen at [facility] on Saturday.  Currently taking Augmentin and benzonatate capsules.  Pt is concerned because she will be going out of town on Sunday.    HPI: Had rti recently on Augmentin but still with congestion and cough no fever cp sob. To go to [facility] and be back jan 21  Nose of meds.went to urgent care.  1 3 15   Fu results of  and doppler  No sx  . abd distress or attacks  Gi changes  ROS: See pertinent positives and negatives per HPI.  Past Medical History  Diagnosis Date  . Allergic rhinitis   . GERD (gastroesophageal reflux disease)   . Osteoarthritis   . Heart murmur   . Cervical back pain with evidence of disc disease   . Nerve damage     left arm  . Transient global amnesia   . Abnormal LFTs   . Phlebitis   . Spinal stenosis   . Osteopenia   . Hypertension   . Abdominal  pain, other specified site     Family History  Problem Relation Age of Onset  . COPD Mother   . Osteoporosis Mother   . Heart disease Mother   . Hypertension Brother   . Hypertension Father   . Alcohol abuse Father   . Lung cancer Father   . Prostate cancer Maternal Grandfather   . Cancer Maternal Grandmother     sinus  . Colon cancer Neg Hx   . Pancreatic cancer Neg Hx   . Stomach cancer Neg Hx   . Valvular heart disease Brother     History   Social History  . Marital Status: Married    Spouse Name: N/A    Number of Children: 2  . Years of Education: N/A   Occupational History  . retired Pharmacist, hospital   .     Social History Main Topics  . Smoking status: Never Smoker   . Smokeless tobacco: Never Used  . Alcohol Use: 8.4 oz/week    14 Glasses of wine per week  . Drug Use: None  . Sexual Activity: None    Other Topics Concern  . 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  [place]   Does water exercise aerobics walking elliptical     Outpatient Encounter Prescriptions as of 02/22/2013  Medication Sig  . acetaminophen (TYLENOL) 500 MG tablet Take 1,000 mg by mouth every 6 (six) hours as needed.    Marland Kitchen amoxicillin-clavulanate (AUGMENTIN) 875-125 MG per tablet   . aspirin 81 MG tablet Take 81 mg by mouth daily.    . benzonatate (TESSALON) 200 MG capsule   . CALCIUM PO Take by mouth.  . celecoxib (CELEBREX) 100 MG capsule Take 100 mg by mouth daily.  . Cholecalciferol (VITAMIN D PO) Take by mouth.  . clorazepate (TRANXENE) 7.5 MG tablet TAKE ONE TABLET BY MOUTH AT BEDTIME  . cyclobenzaprine (FLEXERIL) 10 MG tablet Take 10 mg by mouth as directed.    . fluticasone (FLONASE) 50 MCG/ACT nasal spray Place 2 sprays into the nose daily.  Marland Kitchen  hydroxychloroquine (PLAQUENIL) 200 MG tablet Take 1 tablet by mouth 2 (two) times daily.  Marland Kitchen KRILL OIL PO Take by mouth.  . lidocaine (LIDODERM) 5 % Place 1 patch onto the skin daily. Remove & Discard patch within 12 hours or as directed by MD   . lisinopril (PRINIVIL,ZESTRIL) 20 MG tablet TAKE ONE TABLET BY MOUTH ONCE DAILY.  . traMADol (ULTRAM) 50 MG tablet Take 50 mg by mouth.  . TURMERIC PO Take by mouth.  . [DISCONTINUED] lisinopril (PRINIVIL,ZESTRIL) 20 MG tablet TAKE ONE TABLET BY MOUTH DAILY.  Marland Kitchen polyethylene glycol powder (MIRALAX) powder Take 17 g by mouth as directed.    . sennosides-docusate sodium (SENOKOT-S) 8.6-50 MG tablet Take 1 tablet by mouth daily.      EXAM:  BP 132/80  Temp(Src) 97.9 F (36.6 C) (Oral)  Wt 129 lb (58.514 kg)  Body mass index is 25 kg/(m^2). Wt Readings from Last 3 Encounters:  02/22/13 129 lb (58.514 kg)  12/31/12 131 lb (59.421 kg)  12/28/12 128 lb (58.06 kg)    GENERAL: vitals reviewed and listed above, alert,  oriented, appears well hydrated and in no acute distress HEENT: atraumatic, conjunctiva  clear, no obvious abnormalities on inspection of external nose and ears  tms retracted  No acute findings OP : no lesion edema or exudate  NECK: no obvious masses on inspection palpation  LUNGS: clear to auscultation bilaterally, no wheezes, rales or rhonchi, good air movement CV: HRRR, no clubbing cyanosis or  peripheral edema nl cap refill  PSYCH: pleasant and cooperative, no obvious depression or anxiety Reviewed  abd vascular study .  sma stenosis  Renal arteris nl flow  Mod stenosis of celiac axis .  ASSESSMENT AND PLAN:  Discussed the following assessment and plan:  SMA stenosis - asymptomatic  based on hx . disc seek med care with abd pain  sx of concern . will get vascular opinion for findings   Acute upper respiratory infection of multiple sites - resolving  prolonged no alarm features  -Patient advised to return or notify health care team  if symptoms worsen or persist or new concerns arise.  Patient Instructions  I think you're respiratory infection will heal on its own; could have been flu. Chest is clear today ;ears are congested but ok to travel. You will be contacted about a consult in regard narrowing of the blood vessels in the abdomen. If you develop severe abdominal pain or concerns seek care.  Standley Brooking. Panosh M.D.   Total visit 65mins > 50% spent counseling and coordinating care

## 2013-02-22 NOTE — Patient Instructions (Signed)
I think you're respiratory infection will heal on its own; could have been flu. Chest is clear today ;ears are congested but ok to travel. You will be contacted about a consult in regard narrowing of the blood vessels in the abdomen. If you develop severe abdominal pain or concerns seek care.

## 2013-03-18 ENCOUNTER — Other Ambulatory Visit: Payer: Self-pay

## 2013-03-18 DIAGNOSIS — Z1231 Encounter for screening mammogram for malignant neoplasm of breast: Secondary | ICD-10-CM

## 2013-03-19 ENCOUNTER — Encounter: Payer: Self-pay | Admitting: Internal Medicine

## 2013-03-20 ENCOUNTER — Encounter: Payer: Self-pay | Admitting: Internal Medicine

## 2013-03-25 ENCOUNTER — Encounter: Payer: Self-pay | Admitting: Vascular Surgery

## 2013-03-26 ENCOUNTER — Other Ambulatory Visit: Payer: Self-pay

## 2013-03-26 ENCOUNTER — Ambulatory Visit (INDEPENDENT_AMBULATORY_CARE_PROVIDER_SITE_OTHER): Payer: Medicare Other | Admitting: Vascular Surgery

## 2013-03-26 ENCOUNTER — Encounter: Payer: Self-pay | Admitting: Vascular Surgery

## 2013-03-26 ENCOUNTER — Other Ambulatory Visit (HOSPITAL_COMMUNITY): Payer: Medicare Other

## 2013-03-26 VITALS — BP 118/70 | HR 78 | Resp 16 | Ht 61.0 in | Wt 127.0 lb

## 2013-03-26 DIAGNOSIS — I771 Stricture of artery: Principal | ICD-10-CM

## 2013-03-26 DIAGNOSIS — K551 Chronic vascular disorders of intestine: Secondary | ICD-10-CM

## 2013-03-26 NOTE — Progress Notes (Signed)
Subjective:     Patient ID: Briana Giles, female   DOB: [birth year], 68 y.o.   MRN: [id number]  HPI this healthy 68 year old female was referred by Dr. Fabian Sharp for evaluation of an asymptomatic severe SMA stenosis and mild celiac axis stenosis. Patient was found to have an abdominal bruit on physical exam a few years ago and has had a couple of ultrasound studies. Most recently this was done in the past month which revealed high velocity in the superior mesenteric artery-433 cm/s and the celiac axis-316 cm/s. Patient denies any postprandial pain, weight loss, change in bowel habits, blood per rectum, or other GI symptoms. She also denies any other symptoms such as lower extremity claudication, chest pain, or symptoms of stroke or TIA.  Past Medical History  Diagnosis Date  . Allergic rhinitis   . GERD (gastroesophageal reflux disease)   . Osteoarthritis   . Heart murmur   . Cervical back pain with evidence of disc disease   . Nerve damage     left arm  . Transient global amnesia   . Abnormal LFTs   . Phlebitis   . Spinal stenosis   . Osteopenia   . Hypertension   . Abdominal pain, other specified site     History  Substance Use Topics  . Smoking status: Never Smoker   . Smokeless tobacco: Never Used  . Alcohol Use: 8.4 oz/week    14 Glasses of wine per week    Family History  Problem Relation Age of Onset  . COPD Mother   . Osteoporosis Mother   . Heart disease Mother   . Hypertension Brother   . Hypertension Father   . Alcohol abuse Father   . Lung cancer Father   . Prostate cancer Maternal Grandfather   . Cancer Maternal Grandmother     sinus  . Colon cancer Neg Hx   . Pancreatic cancer Neg Hx   . Stomach cancer Neg Hx   . Valvular heart disease Brother     Allergies  Allergen Reactions  . Codeine Phosphate Other (See Comments)    REACTION: upset stomach    Current outpatient prescriptions:acetaminophen (TYLENOL) 500 MG tablet, Take 1,000 mg by mouth every 6  (six) hours as needed.  , Disp: , Rfl: ;  amoxicillin-clavulanate (AUGMENTIN) 875-125 MG per tablet, , Disp: , Rfl: ;  aspirin 81 MG tablet, Take 81 mg by mouth daily.  , Disp: , Rfl: ;  CALCIUM PO, Take by mouth., Disp: , Rfl: ;  celecoxib (CELEBREX) 100 MG capsule, Take 100 mg by mouth daily., Disp: , Rfl:  Cholecalciferol (VITAMIN D PO), Take by mouth., Disp: , Rfl: ;  clorazepate (TRANXENE) 7.5 MG tablet, TAKE ONE TABLET BY MOUTH AT BEDTIME, Disp: 30 tablet, Rfl: 5;  cyclobenzaprine (FLEXERIL) 10 MG tablet, Take 10 mg by mouth as directed.  , Disp: , Rfl: ;  fluticasone (FLONASE) 50 MCG/ACT nasal spray, Place 2 sprays into the nose daily., Disp: 16 g, Rfl: 11 hydroxychloroquine (PLAQUENIL) 200 MG tablet, Take 1 tablet by mouth 2 (two) times daily., Disp: , Rfl: ;  KRILL OIL PO, Take by mouth., Disp: , Rfl: ;  lidocaine (LIDODERM) 5 %, Place 1 patch onto the skin daily. Remove & Discard patch within 12 hours or as directed by MD , Disp: , Rfl: ;  lisinopril (PRINIVIL,ZESTRIL) 20 MG tablet, TAKE ONE TABLET BY MOUTH ONCE DAILY., Disp: 90 tablet, Rfl: 3 polyethylene glycol powder (MIRALAX) powder, Take 17 g by mouth  as directed.  , Disp: , Rfl: ;  sennosides-docusate sodium (SENOKOT-S) 8.6-50 MG tablet, Take 1 tablet by mouth daily.  , Disp: , Rfl: ;  traMADol (ULTRAM) 50 MG tablet, Take 50 mg by mouth., Disp: , Rfl: ;  TURMERIC PO, Take by mouth., Disp: , Rfl: ;  benzonatate (TESSALON) 200 MG capsule, , Disp: , Rfl:   BP 118/70  Pulse 78  Resp 16  Ht 5\' 1"  (1.549 m)  Wt 127 lb (57.607 kg)  BMI 24.01 kg/m2  Body mass index is 24.01 kg/(m^2).          Review of Systems see history of present illness, denies dyspnea on exertion, PND, orthopnea, hemoptysis, claudication, lateralizing weakness, aphasia, procedures, diplopia, all of systems negative and complete review of systems other than small amount of back discomfort.     Objective:   Physical Exam BP 118/70  Pulse 78  Resp 16  Ht 5\' 1"   (1.549 m)  Wt 127 lb (57.607 kg)  BMI 24.01 kg/m2  Gen.-alert and oriented x3 in no apparent distress HEENT normal for age Lungs no rhonchi or wheezing Cardiovascular regular rhythm no murmurs carotid pulses 3+ palpable no bruits audible Abdomen soft nontender no palpable masses-prominent bruit in the mid epigastrium  Musculoskeletal free of  major deformities Skin clear -no rashes Neurologic normal Lower extremities 3+ femoral and dorsalis pedis pulses palpable bilaterally with no edema  I reviewed the duplex scan which was performed on 01/31/2013 which revealed the significant SMA stenosis.        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

## 2013-03-27 ENCOUNTER — Encounter: Payer: Medicare Other | Admitting: Vascular Surgery

## 2013-03-27 ENCOUNTER — Other Ambulatory Visit (HOSPITAL_COMMUNITY): Payer: Medicare Other

## 2013-04-01 ENCOUNTER — Ambulatory Visit
Admission: RE | Admit: 2013-04-01 | Discharge: 2013-04-01 | Disposition: A | Discharge status: Home / Self Care | Payer: 59 | Source: Ambulatory Visit

## 2013-04-01 DIAGNOSIS — Z1231 Encounter for screening mammogram for malignant neoplasm of breast: Secondary | ICD-10-CM

## 2013-04-23 ENCOUNTER — Other Ambulatory Visit: Payer: Medicare Other

## 2013-04-23 ENCOUNTER — Ambulatory Visit: Payer: Medicare Other | Admitting: Vascular Surgery

## 2013-04-29 ENCOUNTER — Encounter: Payer: Self-pay | Admitting: Vascular Surgery

## 2013-04-30 ENCOUNTER — Ambulatory Visit (INDEPENDENT_AMBULATORY_CARE_PROVIDER_SITE_OTHER): Payer: 59 | Admitting: Vascular Surgery

## 2013-04-30 ENCOUNTER — Encounter: Payer: Self-pay | Admitting: Vascular Surgery

## 2013-04-30 ENCOUNTER — Ambulatory Visit
Admission: RE | Admit: 2013-04-30 | Discharge: 2013-04-30 | Disposition: A | Discharge status: Home / Self Care | Payer: 59 | Source: Ambulatory Visit | Attending: Vascular Surgery | Admitting: Vascular Surgery

## 2013-04-30 VITALS — BP 130/58 | HR 79 | Ht 61.0 in | Wt 128.0 lb

## 2013-04-30 DIAGNOSIS — I774 Celiac artery compression syndrome: Secondary | ICD-10-CM

## 2013-04-30 DIAGNOSIS — K551 Chronic vascular disorders of intestine: Secondary | ICD-10-CM

## 2013-04-30 DIAGNOSIS — I771 Stricture of artery: Principal | ICD-10-CM

## 2013-04-30 MED ORDER — IOHEXOL 350 MG/ML SOLN
100.0000 mL | Freq: Once | INTRAVENOUS | Status: AC | PRN
  Administered 2013-04-30: 100 mL via INTRAVENOUS

## 2013-04-30 NOTE — Progress Notes (Signed)
Subjective:     Patient ID: Briana Giles, female   DOB: Jan 04, 1946, 68 y.o.   MRN: [id number]  HPI this 68 year old female returns today for followup regarding her possible SMA stenosis which is asymptomatic. A duplex scan suggested a severe SMA stenosis a mild celiac stenosis. This was ordered because of an audible bruit in the epigastrium. Patient denies any postprandial pain, weight loss, change in bowel habits, or other symptoms of mesenteric ischemia. Today she had a CT angiogram which I have reviewed.  Past Medical History  Diagnosis Date  . Allergic rhinitis   . GERD (gastroesophageal reflux disease)   . Osteoarthritis   . Heart murmur   . Cervical back pain with evidence of disc disease   . Nerve damage     left arm  . Transient global amnesia   . Abnormal LFTs   . Phlebitis   . Spinal stenosis   . Osteopenia   . Hypertension   . Abdominal pain, other specified site     History  Substance Use Topics  . Smoking status: Never Smoker   . Smokeless tobacco: Never Used  . Alcohol Use: 8.4 oz/week    14 Glasses of wine per week    Family History  Problem Relation Age of Onset  . COPD Mother   . Osteoporosis Mother   . Heart disease Mother   . Hypertension Brother   . Hypertension Father   . Alcohol abuse Father   . Lung cancer Father   . Prostate cancer Maternal Grandfather   . Cancer Maternal Grandmother     sinus  . Colon cancer Neg Hx   . Pancreatic cancer Neg Hx   . Stomach cancer Neg Hx   . Valvular heart disease Brother     Allergies  Allergen Reactions  . Codeine Phosphate Other (See Comments)    REACTION: upset stomach    Current outpatient prescriptions:acetaminophen (TYLENOL) 500 MG tablet, Take 1,000 mg by mouth every 6 (six) hours as needed.  , Disp: , Rfl: ;  amoxicillin-clavulanate (AUGMENTIN) 875-125 MG per tablet, , Disp: , Rfl: ;  aspirin 81 MG tablet, Take 81 mg by mouth daily.  , Disp: , Rfl: ;  benzonatate (TESSALON) 200 MG capsule, ,  Disp: , Rfl: ;  CALCIUM PO, Take by mouth., Disp: , Rfl:  celecoxib (CELEBREX) 100 MG capsule, Take 100 mg by mouth daily., Disp: , Rfl: ;  Cholecalciferol (VITAMIN D PO), Take by mouth., Disp: , Rfl: ;  clorazepate (TRANXENE) 7.5 MG tablet, TAKE ONE TABLET BY MOUTH AT BEDTIME, Disp: 30 tablet, Rfl: 5;  cyclobenzaprine (FLEXERIL) 10 MG tablet, Take 10 mg by mouth as directed.  , Disp: , Rfl: ;  fluticasone (FLONASE) 50 MCG/ACT nasal spray, Place 2 sprays into the nose daily., Disp: 16 g, Rfl: 11 hydroxychloroquine (PLAQUENIL) 200 MG tablet, Take 1 tablet by mouth 2 (two) times daily., Disp: , Rfl: ;  KRILL OIL PO, Take by mouth., Disp: , Rfl: ;  lidocaine (LIDODERM) 5 %, Place 1 patch onto the skin daily. Remove & Discard patch within 12 hours or as directed by MD , Disp: , Rfl: ;  lisinopril (PRINIVIL,ZESTRIL) 20 MG tablet, TAKE ONE TABLET BY MOUTH ONCE DAILY., Disp: 90 tablet, Rfl: 3 polyethylene glycol powder (MIRALAX) powder, Take 17 g by mouth as directed.  , Disp: , Rfl: ;  sennosides-docusate sodium (SENOKOT-S) 8.6-50 MG tablet, Take 1 tablet by mouth daily.  , Disp: , Rfl: ;  traMADol (ULTRAM)  50 MG tablet, Take 50 mg by mouth., Disp: , Rfl: ;  TURMERIC PO, Take by mouth., Disp: , Rfl:   BP 130/58  Pulse 79  Ht 5\' 1"  (1.549 m)  Wt 128 lb (58.06 kg)  BMI 24.20 kg/m2  SpO2 100%  Body mass index is 24.2 kg/(m^2).           Review of Systems denies chest pain, dyspnea on exertion, PND, orthopnea, claudication,-also denies all GI symptoms as discussed in history of present illness     Objective:   Physical Exam BP 130/58  Pulse 79  Ht 5\' 1"  (1.549 m)  Wt 128 lb (58.06 kg)  BMI 24.20 kg/m2  SpO2 100%  General well-developed well-nourished female no apparent distress alert and oriented x3 Lungs no rhonchi or wheezing Abdomen soft nontender  Today I reviewed the CT angiogram of the abdomen and pelvis. The the official radiology interpretation is not available at this time. 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 The IMA is also widely patent. I see no stenosis in the SMA.     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 Will return to see me on a when necessary basis

## 2013-08-02 ENCOUNTER — Other Ambulatory Visit: Payer: Self-pay | Admitting: Internal Medicine

## 2013-08-05 NOTE — Telephone Encounter (Signed)
Sent to the pharmacy by e-scribe. 

## 2013-08-19 ENCOUNTER — Telehealth: Payer: Self-pay | Admitting: Family Medicine

## 2013-08-19 NOTE — Telephone Encounter (Signed)
Patient called and left a message on my machine.  Tried to return the patient's call.  Left a message for her to call back.

## 2013-08-20 NOTE — Telephone Encounter (Signed)
Left a message on the patient's home phone for her to return my call.

## 2013-08-21 MED ORDER — CLORAZEPATE DIPOTASSIUM 7.5 MG PO TABS: ORAL_TABLET | ORAL | Status: DC

## 2013-08-21 NOTE — Telephone Encounter (Signed)
Left a message on home phone for the pt to return my call. 

## 2013-08-21 NOTE — Telephone Encounter (Signed)
Needs a refill of clorazepate (TRANXENE) 7.5 MG tablet sent to [facility] on [address]. Will fax.  Pt notified.

## 2013-08-23 ENCOUNTER — Other Ambulatory Visit: Payer: Self-pay | Admitting: Family Medicine

## 2013-08-23 ENCOUNTER — Other Ambulatory Visit: Payer: Self-pay | Admitting: Internal Medicine

## 2013-08-23 MED ORDER — CLORAZEPATE DIPOTASSIUM 7.5 MG PO TABS: ORAL_TABLET | ORAL | Status: DC

## 2013-10-04 ENCOUNTER — Telehealth: Payer: Self-pay | Admitting: Family Medicine

## 2013-10-04 ENCOUNTER — Telehealth: Payer: Self-pay | Admitting: Internal Medicine

## 2013-10-04 NOTE — Telephone Encounter (Signed)
Patient Information:  Caller Name: Zeah Germano  Phone: [phone]  Patient: Briana Giles  Gender: Female  DOB: 28-Jan-1946  Age: 68 Years  PCP: Shanon Ace (Family Practice)  Office Follow Up:  Does the office need to follow up with this patient?: No  Instructions For The Office: N/A   Symptoms  Reason For Call & Symptoms: Started with cold sx onset 09/03/13 with ear congestion and sore throat which resolved 3-4 days ago. She still has cough worse with laying down and it is keeping her up at night. Cough is dry. Afebrile.  She is using Fluticasone in the mornings and helps during the day but not at night. Takes Coridedin at night. Requesting appointment.  Reviewed Health History In EMR: Yes  Reviewed Medications In EMR: Yes  Reviewed Allergies In EMR: Yes  Reviewed Surgeries / Procedures: Yes  Date of Onset of Symptoms: 09/05/2013  Guideline(s) Used:  Cough  Disposition Per Guideline:   See Within 3 Days in Office  Reason For Disposition Reached:   Cough has been present for > 10 days  Advice Given:  Reassurance  Coughing is the way that our lungs remove irritants and mucus. It helps protect our lungs from getting pneumonia.  You can get a dry hacking cough after a chest cold. Sometimes this type of cough can last 1-3 weeks, and be worse at night.  You can also get a cough after being exposed to irritating substances like smoke, strong perfumes, and dust.  Here is some care advice that should help.  Cough Medicines:  OTC Cough Syrups: The most common cough suppressant in OTC cough medications is dextromethorphan. Often the letters "DM" appear in the name.  OTC Cough Drops: Cough drops can help a lot, especially for mild coughs. They reduce coughing by soothing your irritated throat and removing that tickle sensation in the back of the throat. Cough drops also have the advantage of portability - you can carry them with you.  Home Remedy - Honey: This old home remedy has  been shown to help decrease coughing at night. The adult dosage is 2 teaspoons (10 ml) at bedtime. Honey should not be given to infants under one year of age.  Prevent Dehydration:  Drink adequate liquids.  This will help soothe an irritated or dry throat and loosen up the phlegm.  Contagiousness:  The cold virus is present in your nasal secretions.  Cover your nose and mouth with a tissue when you sneeze or cough.  Wash your hands frequently with soap and water.  You can return to work or school after the fever is gone and you feel well enough to participate in normal activities.  Expected Course:   Fever may last 2-3 days  Nasal discharge 7-14 days  Cough up to 2-3 weeks.  Call Back If:  Difficulty breathing occurs  Fever lasts more than 3 days  Nasal discharge lasts more than 10 days  Cough lasts more than 3 weeks  You become worse  Patient Will Follow Care Advice:  YES  Appointment Scheduled:  10/05/2013 10:30:00 Appointment Scheduled Provider:  Other

## 2013-10-04 NOTE — Telephone Encounter (Signed)
Patient called and left a message on my voicemail.  Stated she continues to have a cough that is from a head cold.  Started the middle of July.  Would like to know what to do.  Please transfer to [facility] for evaluation/advise.  Thanks!

## 2013-10-04 NOTE — Telephone Encounter (Signed)
lmovm to call back.

## 2013-10-04 NOTE — Telephone Encounter (Signed)
Pt called back transferred her to triage

## 2013-10-05 ENCOUNTER — Encounter: Payer: Self-pay | Admitting: Family Medicine

## 2013-10-05 ENCOUNTER — Ambulatory Visit (INDEPENDENT_AMBULATORY_CARE_PROVIDER_SITE_OTHER): Payer: 59 | Admitting: Family Medicine

## 2013-10-05 VITALS — BP 126/62 | HR 79 | Temp 97.6°F | Wt 127.0 lb

## 2013-10-05 DIAGNOSIS — J069 Acute upper respiratory infection, unspecified: Secondary | ICD-10-CM | POA: Insufficient documentation

## 2013-10-05 DIAGNOSIS — R053 Chronic cough: Secondary | ICD-10-CM

## 2013-10-05 DIAGNOSIS — R059 Cough, unspecified: Secondary | ICD-10-CM

## 2013-10-05 DIAGNOSIS — R05 Cough: Secondary | ICD-10-CM | POA: Insufficient documentation

## 2013-10-05 MED ORDER — HYDROCODONE-HOMATROPINE 5-1.5 MG/5ML PO SYRP: 5.0000 mL | ORAL_SOLUTION | Freq: Three times a day (TID) | ORAL | Status: DC | PRN

## 2013-10-05 MED ORDER — OMEPRAZOLE 20 MG PO CPDR: DELAYED_RELEASE_CAPSULE | ORAL | Status: DC

## 2013-10-05 NOTE — Assessment & Plan Note (Signed)
Persistent with night time awakenings. Likely multifactorial- lingering cough from URI- also question some GERD symptoms since occurs only at night. Also should could ACEI intolerance. eRx sent for omeprazole 20 mg nightly x 2 weeks, continue flonase. Follow up with PCP in 1-2 weeks. Also given rx for hycodan prn severe cough.

## 2013-10-05 NOTE — Progress Notes (Signed)
Pre visit review using our clinic review tool, if applicable. No additional management support is needed unless otherwise documented below in the visit note. 

## 2013-10-05 NOTE — Progress Notes (Signed)
Subjective:   Patient ID: Briana Giles, female    DOB: October 22, 1945, 68 y.o.   MRN: [id number]  Briana Giles is a pleasant 68 y.o. year old female new to me who presents to clinic today with Cough and Nasal Congestion  on 10/05/2013  HPI: Had URI symptoms 4 weeks ago, most of symptoms resolved but continues to have night time awakenings with coughing spells.  Cough is dry.  OTC cough syrups- has tried multiple- has not helped. No hemotypsis. No SOB unless having a coughing spell.  No fevers or chills.  No CP.  Losenges and sitting up seems to help.  Does have h/o GERD.  Has been on Lisinopril for years. Current Outpatient Prescriptions on File Prior to Visit  Medication Sig Dispense Refill  . acetaminophen (TYLENOL) 500 MG tablet Take 1,000 mg by mouth every 6 (six) hours as needed.        Marland Kitchen amoxicillin-clavulanate (AUGMENTIN) 875-125 MG per tablet       . aspirin 81 MG tablet Take 81 mg by mouth daily.        . benzonatate (TESSALON) 200 MG capsule       . CALCIUM PO Take by mouth.      . celecoxib (CELEBREX) 100 MG capsule Take 100 mg by mouth daily.      . Cholecalciferol (VITAMIN D PO) Take by mouth.      . clorazepate (TRANXENE) 7.5 MG tablet TAKE ONE TABLET BY MOUTH AT BEDTIME  30 tablet  4  . cyclobenzaprine (FLEXERIL) 10 MG tablet Take 10 mg by mouth as directed.        . fluticasone (FLONASE) 50 MCG/ACT nasal spray USE TWO SPRAYS IN EACH NOSTRIL ONCE DAILY.  16 g  5  . hydroxychloroquine (PLAQUENIL) 200 MG tablet Take 1 tablet by mouth 2 (two) times daily.      Marland Kitchen KRILL OIL PO Take by mouth.      . lidocaine (LIDODERM) 5 % Place 1 patch onto the skin daily. Remove & Discard patch within 12 hours or as directed by MD       . lisinopril (PRINIVIL,ZESTRIL) 20 MG tablet TAKE ONE TABLET BY MOUTH ONCE DAILY.  90 tablet  3  . polyethylene glycol powder (MIRALAX) powder Take 17 g by mouth as directed.        . sennosides-docusate sodium (SENOKOT-S) 8.6-50 MG tablet Take 1  tablet by mouth daily.        . traMADol (ULTRAM) 50 MG tablet Take 50 mg by mouth.      . TURMERIC PO Take by mouth.       No current facility-administered medications on file prior to visit.    Allergies  Allergen Reactions  . Codeine Phosphate Other (See Comments)    REACTION: upset stomach    Past Medical History  Diagnosis Date  . Allergic rhinitis   . GERD (gastroesophageal reflux disease)   . Osteoarthritis   . Heart murmur   . Cervical back pain with evidence of disc disease   . Nerve damage     left arm  . Transient global amnesia   . Abnormal LFTs   . Phlebitis   . Spinal stenosis   . Osteopenia   . Hypertension   . Abdominal pain, other specified site     Past Surgical History  Procedure Laterality Date  . Knee arthroscopy      right  . Cholecystectomy    . Total knee arthroplasty  right  . Bunionectomy      fall 2022 [facility]  . Tonsillectomy    . Foot surgery       x 2    . Replacement unicondylar joint knee      rt knee    Family History  Problem Relation Age of Onset  . COPD Mother   . Osteoporosis Mother   . Heart disease Mother   . Hypertension Brother   . Hypertension Father   . Alcohol abuse Father   . Lung cancer Father   . Prostate cancer Maternal Grandfather   . Cancer Maternal Grandmother     sinus  . Colon cancer Neg Hx   . Pancreatic cancer Neg Hx   . Stomach cancer Neg Hx   . Valvular heart disease Brother     History   Social History  . Marital Status: Married    Spouse Name: N/A    Number of Children: 2  . Years of Education: N/A   Occupational History  . retired Runner, broadcasting/film/video   .     Social History Main Topics  . Smoking status: Never Smoker   . Smokeless tobacco: Never Used  . Alcohol Use: 8.4 oz/week    14 Glasses of wine per week  . Drug Use: No  . Sexual Activity: Not on file   Other Topics Concern  . 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  [place]   Does water exercise aerobics walking elliptical    The PMH, PSH, Social History, Family History, Medications, and allergies have been reviewed in [facility], and have been updated if relevant.   Review of Systems See HPI No CP or SOB No n/v No rashes No ear pain    Objective:    BP 126/62  Pulse 79  Temp(Src) 97.6 F (36.4 C) (Oral)  Wt 127 lb (57.607 kg)  SpO2 96%   Physical Exam  Nursing note and vitals reviewed. Constitutional: She appears well-developed and well-nourished. No distress.  HENT:  Head: Normocephalic and atraumatic.  Left Ear: External ear normal.  Mouth/Throat: No oropharyngeal exudate.  Eyes: Pupils are equal, round, and reactive to light.  Neck: Normal range of motion. Neck supple.  Cardiovascular: Normal rate and regular rhythm.   Pulmonary/Chest: Effort normal and breath sounds normal. No respiratory distress. She has no wheezes. She has no rales. She exhibits no tenderness.  Skin: Skin is warm and dry.  Psychiatric: She has a normal mood and affect. Her behavior is normal. Judgment and thought content normal.          Assessment & Plan:   Acute upper respiratory infections of unspecified site No Follow-up on file.

## 2013-10-05 NOTE — Patient Instructions (Signed)
Great to meet you. Please take prilosec 20 mg nightly for two weeks. Ok to use hycodan at night for severe cough.  Continue flonase.  Make an appointment with doctor in 1 -2 weeks.

## 2013-10-14 ENCOUNTER — Telehealth: Payer: Self-pay | Admitting: Family Medicine

## 2013-10-14 NOTE — Telephone Encounter (Signed)
Pt calling to get a refill of clorazepate (TRANXENE) 7.5 MG tablet.  Uncertain where she wants this sent.  Will need to contact her for further information.

## 2013-10-15 MED ORDER — CLORAZEPATE DIPOTASSIUM 7.5 MG PO TABS: ORAL_TABLET | ORAL | Status: DC

## 2013-10-15 NOTE — Telephone Encounter (Signed)
[facility] on [address]  °

## 2013-10-15 NOTE — Telephone Encounter (Signed)
Left a message for the pt to return my call.  

## 2013-10-15 NOTE — Telephone Encounter (Signed)
Called and spoke to the pharmacy.  Pt should have had refills on file but she transferred the last prescription to [place] so now has nothing left on file.  She has an upcoming CPE on 01/06/14.  Will fill until then.  Refills left on voicemail.

## 2013-11-18 ENCOUNTER — Other Ambulatory Visit: Payer: Self-pay | Admitting: Internal Medicine

## 2013-11-18 NOTE — Telephone Encounter (Signed)
Sent to the pharmacy by e-scribe.  Pt has scheduled CPX on 01/06/14

## 2013-12-31 ENCOUNTER — Ambulatory Visit (INDEPENDENT_AMBULATORY_CARE_PROVIDER_SITE_OTHER): Payer: 59

## 2013-12-31 ENCOUNTER — Telehealth: Payer: Self-pay | Admitting: Internal Medicine

## 2013-12-31 ENCOUNTER — Ambulatory Visit: Payer: 59

## 2013-12-31 DIAGNOSIS — Z23 Encounter for immunization: Secondary | ICD-10-CM

## 2014-01-01 NOTE — Telephone Encounter (Signed)
LMOM for the pt to return my call.

## 2014-01-02 NOTE — Telephone Encounter (Signed)
LMOM for the pt to return my call.

## 2014-01-06 ENCOUNTER — Ambulatory Visit (INDEPENDENT_AMBULATORY_CARE_PROVIDER_SITE_OTHER): Payer: 59 | Admitting: Internal Medicine

## 2014-01-06 ENCOUNTER — Encounter: Payer: Self-pay | Admitting: Internal Medicine

## 2014-01-06 VITALS — BP 112/70 | Temp 97.9°F | Ht 60.5 in | Wt 127.6 lb

## 2014-01-06 DIAGNOSIS — Z79899 Other long term (current) drug therapy: Secondary | ICD-10-CM | POA: Insufficient documentation

## 2014-01-06 DIAGNOSIS — Z0000 Encounter for general adult medical examination without abnormal findings: Secondary | ICD-10-CM

## 2014-01-06 DIAGNOSIS — Z23 Encounter for immunization: Secondary | ICD-10-CM

## 2014-01-06 DIAGNOSIS — M479 Spondylosis, unspecified: Secondary | ICD-10-CM

## 2014-01-06 DIAGNOSIS — G479 Sleep disorder, unspecified: Secondary | ICD-10-CM

## 2014-01-06 DIAGNOSIS — Z791 Long term (current) use of non-steroidal anti-inflammatories (NSAID): Secondary | ICD-10-CM

## 2014-01-06 DIAGNOSIS — I10 Essential (primary) hypertension: Secondary | ICD-10-CM

## 2014-01-06 LAB — HEPATIC FUNCTION PANEL
ALT: 33 U/L (ref 0–35)
AST: 30 U/L (ref 0–37)
Albumin: 5 g/dL (ref 3.5–5.2)
Alkaline Phosphatase: 119 U/L — ABNORMAL HIGH (ref 39–117)
BILIRUBIN DIRECT: 0 mg/dL (ref 0.0–0.3)
Total Bilirubin: 0.8 mg/dL (ref 0.2–1.2)
Total Protein: 8.1 g/dL (ref 6.0–8.3)

## 2014-01-06 LAB — BASIC METABOLIC PANEL
BUN: 25 mg/dL — ABNORMAL HIGH (ref 6–23)
CALCIUM: 10.2 mg/dL (ref 8.4–10.5)
CO2: 27 meq/L (ref 19–32)
Chloride: 102 mEq/L (ref 96–112)
Creatinine, Ser: 0.9 mg/dL (ref 0.4–1.2)
GFR: 66.78 mL/min (ref >60.00)
GLUCOSE: 87 mg/dL (ref 70–99)
Potassium: 5.2 mEq/L — ABNORMAL HIGH (ref 3.5–5.1)
SODIUM: 142 meq/L (ref 135–145)

## 2014-01-06 LAB — CBC WITH DIFFERENTIAL/PLATELET
Basophils Absolute: 0 10*3/uL (ref 0.0–0.1)
Basophils Relative: 0.7 % (ref 0.0–3.0)
Eosinophils Absolute: 0.2 10*3/uL (ref 0.0–0.7)
Eosinophils Relative: 3.6 % (ref 0.0–5.0)
HCT: 46.4 % — ABNORMAL HIGH (ref 36.0–46.0)
HEMOGLOBIN: 15.4 g/dL — AB (ref 12.0–15.0)
Lymphocytes Relative: 19.9 % (ref 12.0–46.0)
Lymphs Abs: 1.3 10*3/uL (ref 0.7–4.0)
MCHC: 33.1 g/dL (ref 30.0–36.0)
MCV: 93.9 fl (ref 78.0–100.0)
Monocytes Absolute: 0.5 10*3/uL (ref 0.1–1.0)
Monocytes Relative: 8.7 % (ref 3.0–12.0)
NEUTROS ABS: 4.2 10*3/uL (ref 1.4–7.7)
Neutrophils Relative %: 67.1 % (ref 43.0–77.0)
Platelets: 240 10*3/uL (ref 150.0–400.0)
RBC: 4.94 Mil/uL (ref 3.87–5.11)
RDW: 13.8 % (ref 11.5–15.5)
WBC: 6.3 10*3/uL (ref 4.0–10.5)

## 2014-01-06 LAB — LIPID PANEL
CHOL/HDL RATIO: 3
Cholesterol: 221 mg/dL — ABNORMAL HIGH (ref 0–200)
HDL: 75.4 mg/dL (ref >39.00)
LDL Cholesterol: 130 mg/dL — ABNORMAL HIGH (ref 0–99)
NONHDL: 145.6
TRIGLYCERIDES: 76 mg/dL (ref 0.0–149.0)
VLDL: 15.2 mg/dL (ref 0.0–40.0)

## 2014-01-06 LAB — TSH: TSH: 1.65 u[IU]/mL (ref 0.35–4.50)

## 2014-01-06 MED ORDER — CLORAZEPATE DIPOTASSIUM 7.5 MG PO TABS: ORAL_TABLET | ORAL | Status: DC

## 2014-01-06 MED ORDER — MIRTAZAPINE 15 MG PO TABS: 15.0000 mg | ORAL_TABLET | Freq: Every day | ORAL | Status: DC

## 2014-01-06 NOTE — Telephone Encounter (Signed)
Pt seen in [place] today.  Needed referral for cataract surgery.  Per [facility], pt should talk to doctor to see if referral is needed.  Instructed the pt to call back if a referral is needed.

## 2014-01-06 NOTE — Patient Instructions (Addendum)
   Can try   remeron low dose for sleep time   .... Alternating with tranxene .Marland Kitchen   Trying to use only as needed ofr anxiety and not sleep  Alcohol can interfere with sleep Try no etoh for 2 weeks and then alternating  Medication as needed. Will notify you  of labs when available. Continue  Exercise as possible .  ROV in 6 mos for med evaluation or as needed  [facility]  Contact  us if needed. Bone density in  1-2 years   Healthy lifestyle includes : At least 150 minutes of exercise weeks  , weight at healthy levels, which is usually   BMI 19-25. Avoid trans fats and processed foods;  Increase fresh fruits and veges to 5 servings per day. And avoid sweet beverages including tea and juice. Mediterranean diet with olive oil and nuts have been noted to be heart and brain healthy . Avoid tobacco products . Limit  alcohol to  7 per week for women and 14 servings for men.  Get adequate sleep . Wear seat belts . Don't text and drive .

## 2014-01-06 NOTE — Progress Notes (Signed)
Pre visit review using our clinic review tool, if applicable. No additional management support is needed unless otherwise documented below in the visit note.  Chief Complaint  Patient presents with  . Medicare Wellness    HPI: Patient  Briana Giles 68 y.o. comes in today for Preventive Medicare wellness visit . No major injuries, ed visits ,hospitalizations , new medications since last visit.  tranzene  once a night .  For along time  Helps   ? Sleep  Has been on for a ling time is now aawarer of readings on risk memory etc  Bp doing ok  celebrex qd  Luz Lex a lot   [facility] child visits  Gi acid reflux   At times with travel and eating and drinking more.   Tramadol  i f  needed not very much . deveshewar visit   every 6 months .   Had numbness in left arm and hand .  Cervical stenosis and impinged nerve .   radiofrequncy  Ablation that helped.   Her back  Tries to stay active  .    Health Maintenance  Topic Date Due  . INFLUENZA VACCINE  09/15/2014  . MAMMOGRAM  04/02/2015  . COLONOSCOPY  12/29/2022  . TETANUS/TDAP  01/07/2024  . PNEUMOCOCCAL POLYSACCHARIDE VACCINE AGE 67 AND OVER  Completed  . ZOSTAVAX  Completed   Health Maintenance Review LIFESTYLE:  Exercise:   Yes  Travels  Foes to Y  Yoga  Tobacco/ETS: no Alcohol:   2 per day  Sugar beverages: no Sleep:  Generally 7  Drug use: no Bone density:  2013  - 1.2  Hip  Colonoscopy: utd  mammo  utd    Hearing:  Ok this time .  Vision:  No limitations at present . Last eye check UTD  Developed cataracts  Signs and readings an issue  Even with glasses  Dr Katy Fitch.   Safety:  Has smoke detector and wears seat belts.  No firearms. No excess sun exposure. Sees dentist regularly.    Falls: no except careless  Slipped on tiles .  Advance directive :  Reviewed  Has one.  Memory: Felt to be good  ,  Names  no concern from her or her family.  Depression: No anhedonia unusual crying or depressive  symptoms  Nutrition: Eats well balanced diet; adequate calcium and vitamin D. No swallowing chewing problems.  Injury: no major injuries in the last six months.  Other healthcare providers:  Reviewed today .  Social:  Lives with spouse married. hh of 2 cats .   Preventive parameters: up-to-date  Reviewed   ADLS:   There are no problems or need for assistance  driving, feeding, obtaining food, dressing, toileting and bathing, managing money using phone. She is independent.   ROS:  GEN/ HEENT: No fever, significant weight changes sweats headaches vision problems hearing changes, CV/ PULM; No chest pain shortness of breath cough, syncope,edema  change in exercise tolerance. GI /GU: No adominal pain, vomiting, change in bowel habits. No blood in the stool. No significant GU symptoms. SKIN/HEME: ,no acute skin rashes suspicious lesions or bleeding. No lymphadenopathy, nodules, masses.  NEURO/ PSYCH:  No neurologic signs such as weakness numbness. No depression anxiety. IMM/ Allergy: No unusual infections.  Allergy .   REST of 12 system review negative except as per HPI   Past Medical History  Diagnosis Date  . Allergic rhinitis   . GERD (gastroesophageal reflux disease)   .  Osteoarthritis   . Heart murmur   . Cervical back pain with evidence of disc disease   . Nerve damage     left arm  . Transient global amnesia   . Abnormal LFTs   . Phlebitis   . Spinal stenosis   . Osteopenia   . Hypertension   . Abdominal pain, other specified site     Family History  Problem Relation Age of Onset  . COPD Mother   . Osteoporosis Mother   . Heart disease Mother   . Hypertension Brother   . Hypertension Father   . Alcohol abuse Father   . Lung cancer Father   . Prostate cancer Maternal Grandfather   . Cancer Maternal Grandmother     sinus  . Colon cancer Neg Hx   . Pancreatic cancer Neg Hx   . Stomach cancer Neg Hx   . Valvular heart disease Brother     History   Social  History  . Marital Status: Married    Spouse Name: N/A    Number of Children: 2  . Years of Education: N/A   Occupational History  . retired Pharmacist, hospital   .     Social History Main Topics  . Smoking status: Never Smoker   . Smokeless tobacco: Never Used  . Alcohol Use: 8.4 oz/week    14 Glasses of wine per week  . Drug Use: No  . Sexual Activity: None   Other Topics Concern  . 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  [place] grand child    Does water exercise aerobics walking elliptical     Outpatient Encounter Prescriptions as of 01/06/2014  Medication Sig  . acetaminophen (TYLENOL) 500 MG tablet Take 1,000 mg by mouth every 6 (six) hours as needed.    Marland Kitchen aspirin 81 MG tablet Take 81 mg by mouth daily.    Marland Kitchen CALCIUM PO Take by mouth.  . celecoxib (CELEBREX) 100 MG capsule Take 100 mg by mouth daily.  . Cholecalciferol (VITAMIN D PO) Take by mouth.  . clorazepate (TRANXENE) 7.5 MG tablet TAKE ONE TABLET BY MOUTH AT BEDTIME as needed  . fluticasone (FLONASE) 50 MCG/ACT nasal spray USE TWO SPRAYS IN EACH NOSTRIL ONCE DAILY.  Marland Kitchen KRILL OIL PO Take by mouth.  Marland Kitchen lisinopril (PRINIVIL,ZESTRIL) 20 MG tablet TAKE ONE TABLET BY MOUTH ONCE DAILY  . omeprazole (PRILOSEC) 20 MG capsule TAKE ONE CAPSULE BY MOUTH ONCE EVERY DAY  . polyethylene glycol powder (MIRALAX) powder Take 17 g by mouth as directed.    . sennosides-docusate sodium (SENOKOT-S) 8.6-50 MG tablet Take 1 tablet by mouth daily.    . traMADol (ULTRAM) 50 MG tablet Take 50 mg by mouth.  . TURMERIC PO Take by mouth.  . [DISCONTINUED] clorazepate (TRANXENE) 7.5 MG tablet TAKE ONE TABLET BY MOUTH AT BEDTIME  . amoxicillin-clavulanate (AUGMENTIN) 875-125 MG per tablet   . mirtazapine (REMERON) 15 MG tablet Take 1 tablet (15 mg total) by mouth at bedtime. May increase to 30 mg  . [DISCONTINUED] benzonatate (TESSALON) 200 MG capsule    . [DISCONTINUED] cyclobenzaprine (FLEXERIL) 10 MG tablet Take 10 mg by mouth as directed.    . [DISCONTINUED] HYDROcodone-homatropine (HYCODAN) 5-1.5 MG/5ML syrup Take 5 mLs by mouth every 8 (eight) hours as needed for cough.  . [DISCONTINUED] hydroxychloroquine (PLAQUENIL) 200 MG tablet Take  1 tablet by mouth 2 (two) times daily.  . [DISCONTINUED] lidocaine (LIDODERM) 5 % Place 1 patch onto the skin daily. Remove & Discard patch within 12 hours or as directed by MD     EXAM:  BP 112/70 mmHg  Temp(Src) 97.9 F (36.6 C) (Oral)  Ht 5' 0.5" (1.537 m)  Wt 127 lb 9.6 oz (57.879 kg)  BMI 24.50 kg/m2  Body mass index is 24.5 kg/(m^2).  Physical Exam: Vital signs reviewed DUK:GURK is a well-developed well-nourished alert cooperative   who appears stated age in no acute distress.  HEENT: normocephalic atraumatic , Eyes: PERRL EOM's full, conjunctiva clear, Nares: paten,t no deformity discharge or tenderness., Ears: no deformity EAC's clear TMs with normal landmarks. Mouth: clear OP, no lesions, edema.  Moist mucous membranes. Dentition in adequate repair. NECK: supple without masses, thyromegaly or bruits. CHEST/PULM:  Clear to auscultation and percussion breath sounds equal no wheeze , rales or rhonchi. No chest wall deformities or tenderness. CV: PMI is nondisplaced, S1 S2 no gallops, murmurs, rubs. Peripheral pulses are full without delay.No JVD .  Breast: done by gyne this year . ABDOMEN: Bowel sounds normal nontender  No guard or rebound, no hepato splenomegal no CVA tenderness.  No hernia. Extremtities:  No clubbing cyanosis or edema, no acute joint swelling or redness no focal atrophy NEURO:  Oriented x3, cranial nerves 3-12 appear to be intact, no obvious focal weakness,gait within normal limits no abnormal reflexes or asymmetrical SKIN: No acute rashes normal turgor, color, no bruising or petechiae. PSYCH: Oriented, good eye contact, no obvious depression anxiety, cognition and  judgment appear normal. LN: no cervical axillary inguinal adenopathy No noted deficits in memory, attention, and speech. Grossly   ASSESSMENT AND PLAN:  Discussed the following assessment and plan:  Visit for preventive health examination  Encounter for Medicare annual wellness exam - Plan: Basic metabolic panel, CBC with Differential, Hepatic function panel, Lipid panel, TSH  Medicare annual wellness visit, subsequent - Plan: Basic metabolic panel, CBC with Differential, Hepatic function panel, Lipid panel, TSH  Osteoarthritis of spine, unspecified spinal osteoarthritis, unspecified spinal region - Plan: Basic metabolic panel, CBC with Differential, Hepatic function panel, Lipid panel, TSH  Disturbance in sleep behavior - Plan: Basic metabolic panel, CBC with Differential, Hepatic function panel, Lipid panel, TSH  Chronic prescription benzodiazepine use - counseled about risk and trial weaning ans just using prn  alos uses etoh 2 per night risk for memory problems trial remergon wean otherwise   Essential hypertension - Plan: Basic metabolic panel, CBC with Differential, Hepatic function panel, Lipid panel, TSH  Medication management - Plan: Basic metabolic panel, CBC with Differential, Hepatic function panel, Lipid panel, TSH  Need for Tdap vaccination - Plan: Tdap vaccine greater than or equal to 7yo IM  Need for vaccination with 13-polyvalent pneumococcal conjugate vaccine - Plan: Pneumococcal conjugate vaccine 13-valent  NSAID long-term use cox 2 Refilled the Tranxene 90  with instructions about trying to wean off. Risk benefit discussed would not advise continuing chronic benzo use if any concern about memory falling other risk. Physician to other options would be rocky but she needs to be aware of risk  NEW prescription given for Remeron generic 15 mg can take 30 at night that she can alter and used a stick Tranxene just for an acute event. Follow-up in 6 months. Certainly 2  servings of alcohol and night can affect her sleep and concern of interacting with her benzo diazepam. Advise she not have alcohol  and Tranxene in the same evening. Patient Care Team: Burnis Medin, MD as PCP - General Wallene Huh, DPM (Podiatry) Bo Merino, MD (Rheumatology) Newt Minion, MD (Orthopedic Surgery) Milus Banister, MD (Gastroenterology) Jari Pigg, MD as Attending Physician (Dermatology) Laurance Flatten, MD as Referring Physician (Orthopedic Surgery) Newt Minion, MD as Attending Physician (Orthopedic Surgery)  Patient Instructions    Can try   remeron low dose for sleep time   .... Alternating with tranxene .Marland Kitchen   Trying to use only as needed ofr anxiety and not sleep  Alcohol can interfere with sleep Try no etoh for 2 weeks and then alternating  Medication as needed. Will notify you  of labs when available. Continue  Exercise as possible .  ROV in 6 mos for med evaluation or as needed  [facility]  Contact  us if needed. Bone density in  1-2 years   Healthy lifestyle includes : At least 150 minutes of exercise weeks  , weight at healthy levels, which is usually   BMI 19-25. Avoid trans fats and processed foods;  Increase fresh fruits and veges to 5 servings per day. And avoid sweet beverages including tea and juice. Mediterranean diet with olive oil and nuts have been noted to be heart and brain healthy . Avoid tobacco products . Limit  alcohol to  7 per week for women and 14 servings for men.  Get adequate sleep . Wear seat belts . Don't text and drive .       Standley Brooking. Tenzin Pavon M.D.

## 2014-01-07 ENCOUNTER — Telehealth: Payer: Self-pay | Admitting: Internal Medicine

## 2014-01-07 NOTE — Telephone Encounter (Signed)
emmi emailed °

## 2014-01-20 ENCOUNTER — Other Ambulatory Visit: Payer: Self-pay | Admitting: Family Medicine

## 2014-01-20 DIAGNOSIS — R748 Abnormal levels of other serum enzymes: Secondary | ICD-10-CM

## 2014-01-27 ENCOUNTER — Encounter: Payer: Self-pay | Admitting: Family Medicine

## 2014-02-17 ENCOUNTER — Other Ambulatory Visit: Payer: Self-pay | Admitting: Internal Medicine

## 2014-02-18 NOTE — Telephone Encounter (Signed)
Sent to the pharmacy by e-scribe. 

## 2014-03-25 ENCOUNTER — Encounter: Payer: Self-pay | Admitting: Internal Medicine

## 2014-03-31 ENCOUNTER — Other Ambulatory Visit (INDEPENDENT_AMBULATORY_CARE_PROVIDER_SITE_OTHER): Payer: Medicare Other

## 2014-03-31 DIAGNOSIS — M859 Disorder of bone density and structure, unspecified: Secondary | ICD-10-CM

## 2014-03-31 DIAGNOSIS — R748 Abnormal levels of other serum enzymes: Secondary | ICD-10-CM

## 2014-03-31 LAB — HEPATIC FUNCTION PANEL
ALBUMIN: 4.1 g/dL (ref 3.5–5.2)
ALT: 24 U/L (ref 0–35)
AST: 23 U/L (ref 0–37)
Alkaline Phosphatase: 105 U/L (ref 39–117)
Bilirubin, Direct: 0.1 mg/dL (ref 0.0–0.3)
Total Bilirubin: 0.4 mg/dL (ref 0.2–1.2)
Total Protein: 7 g/dL (ref 6.0–8.3)

## 2014-03-31 LAB — GAMMA GT: GGT: 27 U/L (ref 7–51)

## 2014-04-01 LAB — VITAMIN D 25 HYDROXY (VIT D DEFICIENCY, FRACTURES): VITD: 35.19 ng/mL (ref 30.00–100.00)

## 2014-07-18 ENCOUNTER — Other Ambulatory Visit: Payer: Self-pay | Admitting: Internal Medicine

## 2014-07-18 MED ORDER — CLORAZEPATE DIPOTASSIUM 7.5 MG PO TABS: ORAL_TABLET | ORAL | Status: DC

## 2014-07-18 NOTE — Addendum Note (Signed)
Addended by: Miles Costain T on: 07/18/2014 05:21 PM   Modules accepted: Orders

## 2014-07-18 NOTE — Telephone Encounter (Signed)
Called to the pharmacy and left on machine. 

## 2014-07-18 NOTE — Telephone Encounter (Signed)
Sent to the pharmacy by e-scribe. 

## 2014-07-18 NOTE — Addendum Note (Signed)
Addended by: Miles Costain T on: 07/18/2014 05:17 PM   Modules accepted: Orders

## 2014-08-28 ENCOUNTER — Encounter (HOSPITAL_COMMUNITY): Payer: Self-pay | Admitting: Emergency Medicine

## 2014-08-28 ENCOUNTER — Emergency Department (HOSPITAL_COMMUNITY): Payer: Medicare Other

## 2014-08-28 ENCOUNTER — Emergency Department (HOSPITAL_COMMUNITY)
Admission: EM | Admit: 2014-08-28 | Discharge: 2014-08-29 | Disposition: A | Discharge status: Home / Self Care | Payer: Medicare Other | Attending: Emergency Medicine | Admitting: Emergency Medicine

## 2014-08-28 DIAGNOSIS — K5909 Other constipation: Secondary | ICD-10-CM | POA: Insufficient documentation

## 2014-08-28 DIAGNOSIS — Z7951 Long term (current) use of inhaled steroids: Secondary | ICD-10-CM | POA: Diagnosis not present

## 2014-08-28 DIAGNOSIS — Z79899 Other long term (current) drug therapy: Secondary | ICD-10-CM | POA: Insufficient documentation

## 2014-08-28 DIAGNOSIS — M1990 Unspecified osteoarthritis, unspecified site: Secondary | ICD-10-CM | POA: Diagnosis not present

## 2014-08-28 DIAGNOSIS — R079 Chest pain, unspecified: Secondary | ICD-10-CM | POA: Diagnosis present

## 2014-08-28 DIAGNOSIS — Z791 Long term (current) use of non-steroidal anti-inflammatories (NSAID): Secondary | ICD-10-CM | POA: Insufficient documentation

## 2014-08-28 DIAGNOSIS — K219 Gastro-esophageal reflux disease without esophagitis: Secondary | ICD-10-CM | POA: Diagnosis not present

## 2014-08-28 DIAGNOSIS — I10 Essential (primary) hypertension: Secondary | ICD-10-CM | POA: Insufficient documentation

## 2014-08-28 DIAGNOSIS — Z7982 Long term (current) use of aspirin: Secondary | ICD-10-CM | POA: Insufficient documentation

## 2014-08-28 DIAGNOSIS — R011 Cardiac murmur, unspecified: Secondary | ICD-10-CM | POA: Diagnosis not present

## 2014-08-28 DIAGNOSIS — Z8669 Personal history of other diseases of the nervous system and sense organs: Secondary | ICD-10-CM | POA: Insufficient documentation

## 2014-08-28 DIAGNOSIS — K5904 Chronic idiopathic constipation: Secondary | ICD-10-CM

## 2014-08-28 LAB — BASIC METABOLIC PANEL
Anion gap: 9 (ref 5–15)
BUN: 24 mg/dL — AB (ref 6–20)
CHLORIDE: 100 mmol/L — AB (ref 101–111)
CO2: 25 mmol/L (ref 22–32)
Calcium: 9.5 mg/dL (ref 8.9–10.3)
Creatinine, Ser: 0.68 mg/dL (ref 0.44–1.00)
GFR calc Af Amer: >60 mL/min (ref >60)
Glucose, Bld: 97 mg/dL (ref 65–99)
Potassium: 4.1 mmol/L (ref 3.5–5.1)
SODIUM: 134 mmol/L — AB (ref 135–145)

## 2014-08-28 LAB — CBC
HCT: 40.7 % (ref 36.0–46.0)
Hemoglobin: 13.4 g/dL (ref 12.0–15.0)
MCH: 31 pg (ref 26.0–34.0)
MCHC: 32.9 g/dL (ref 30.0–36.0)
MCV: 94.2 fL (ref 78.0–100.0)
Platelets: 198 10*3/uL (ref 150–400)
RBC: 4.32 MIL/uL (ref 3.87–5.11)
RDW: 12.8 % (ref 11.5–15.5)
WBC: 7.4 10*3/uL (ref 4.0–10.5)

## 2014-08-28 LAB — I-STAT TROPONIN, ED: Troponin i, poc: 0 ng/mL (ref 0.00–0.08)

## 2014-08-28 MED ORDER — GI COCKTAIL ~~LOC~~
30.0000 mL | Freq: Once | ORAL | Status: AC
  Administered 2014-08-29: 30 mL via ORAL
  Filled 2014-08-28: qty 30

## 2014-08-28 NOTE — ED Notes (Signed)
Patient complains of epigastric and lower back pain.  She has taken TUMS and antacid today and the pain has not resolved.  She has some nausea but vomiting. No fall or injuries.

## 2014-08-29 ENCOUNTER — Encounter (HOSPITAL_COMMUNITY): Payer: Self-pay | Admitting: Emergency Medicine

## 2014-08-29 LAB — URINALYSIS, ROUTINE W REFLEX MICROSCOPIC
Bilirubin Urine: NEGATIVE
Glucose, UA: NEGATIVE mg/dL
HGB URINE DIPSTICK: NEGATIVE
Ketones, ur: NEGATIVE mg/dL
Leukocytes, UA: NEGATIVE
Nitrite: NEGATIVE
PH: 5.5 (ref 5.0–8.0)
Protein, ur: NEGATIVE mg/dL
Specific Gravity, Urine: 1.011 (ref 1.005–1.030)
Urobilinogen, UA: 0.2 mg/dL (ref 0.0–1.0)

## 2014-08-29 LAB — I-STAT TROPONIN, ED: TROPONIN I, POC: 0 ng/mL (ref 0.00–0.08)

## 2014-08-29 IMAGING — CT CT CTA ABD/PEL W/CM AND/OR W/O CM
4 of 12 series · 10 of 36 positions shown, 13 images · IV contrast ([ID] [id number])
Comparison: US ABDOMEN COMPLETE dated 05/13/2010

CLINICAL DATA: Abdominal bruit on physical examination with
ultrasound evidence of elevated velocities at the level of the
celiac axis and SMA.

EXAM:
CT ANGIOGRAPHY ABDOMEN AND PELVIS WITH CONTRAST AND WITHOUT CONTRAST
TECHNIQUE: Multidetector CT imaging of the abdomen and pelvis was performed
using the standard protocol during bolus administration of
intravenous contrast. Multiplanar reconstructed images and MIPs were
obtained and reviewed to evaluate the vascular anatomy.
CONTRAST:  100mL OMNIPAQUE IOHEXOL 350 MG/ML SOLN

[Series 5: arterial (id) · axial · arterial · 0.70mm/px · z∈[-255,-128]mm · 2 of 154 slices shown]
[im 52/154  soft-tissue]
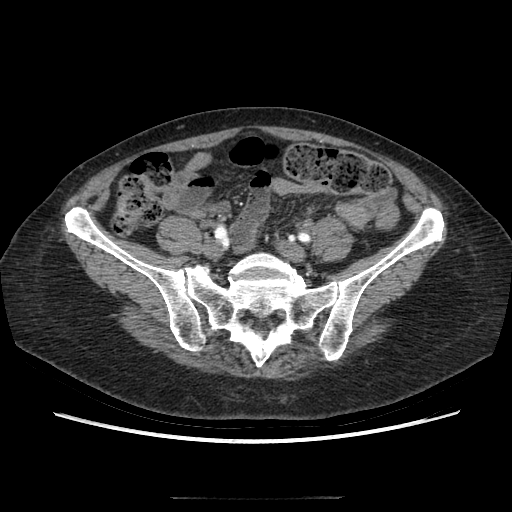
[im 103/154  soft-tissue]
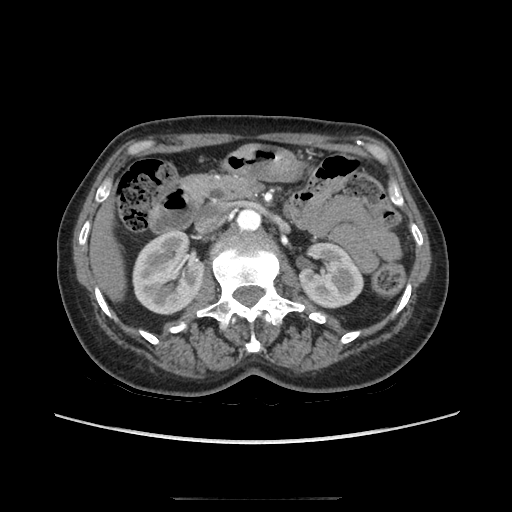

[Series 8: portal venous 5mm · axial · portal-venous · 0.70mm/px · z∈[-395,+0]mm · 2 of 72 slices shown, 5 images]
[im 1/72  soft-tissue]
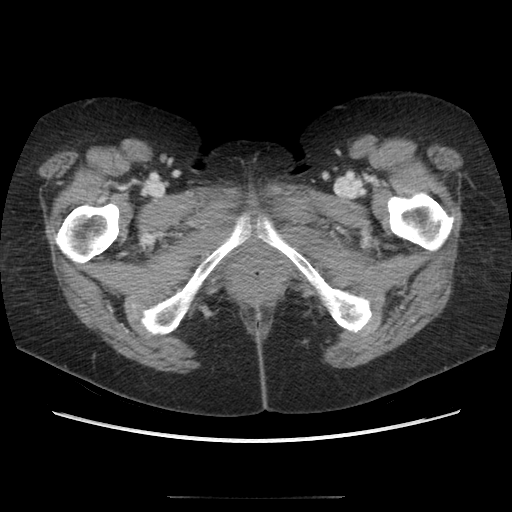
[im 1/72  lung]
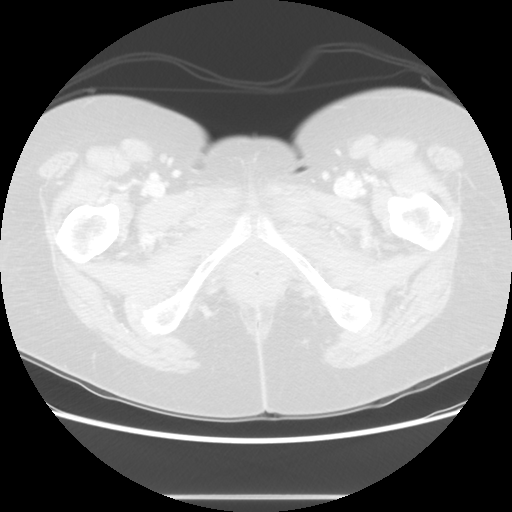
[im 1/72  bone]
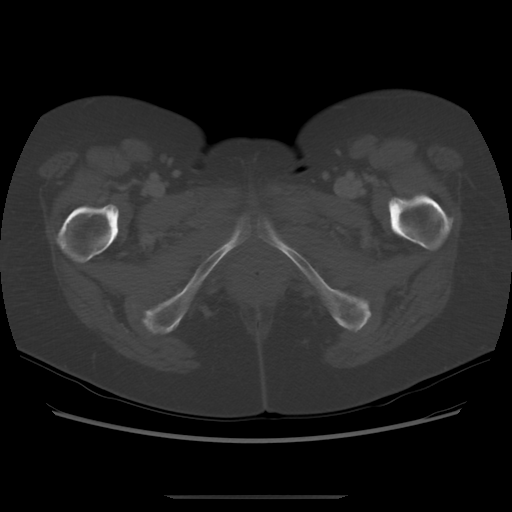
[im 72/72  soft-tissue]
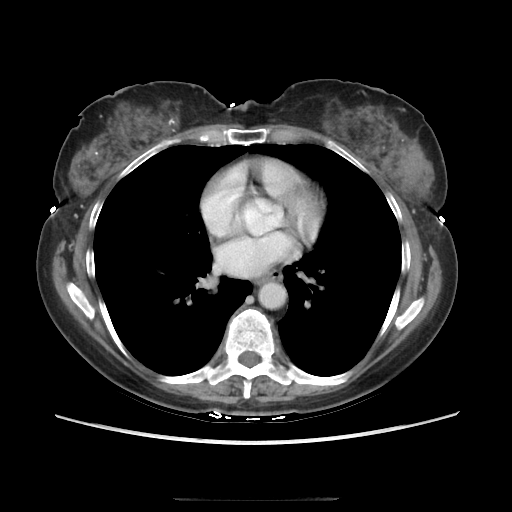
[im 72/72  lung]
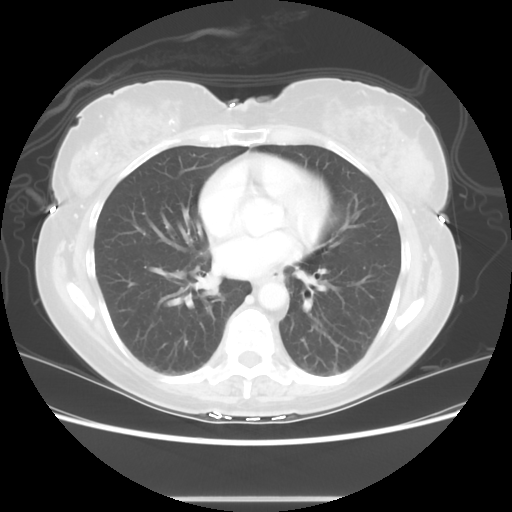

[Series 602: sagittal body · sagittal · 0.84mm/px · 3 of 145 slices shown (1 of 2)]
[im 37/145  soft-tissue]
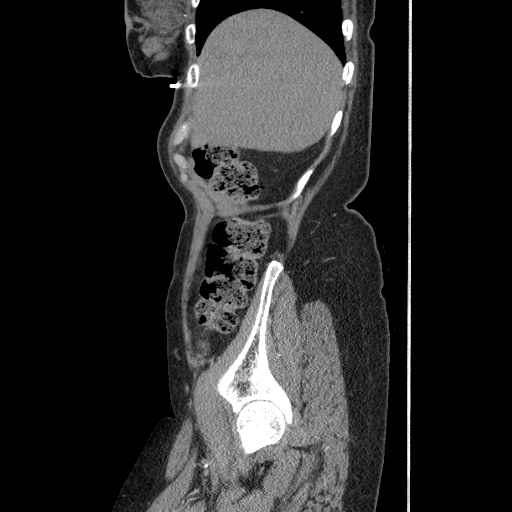
[im 73/145  soft-tissue]
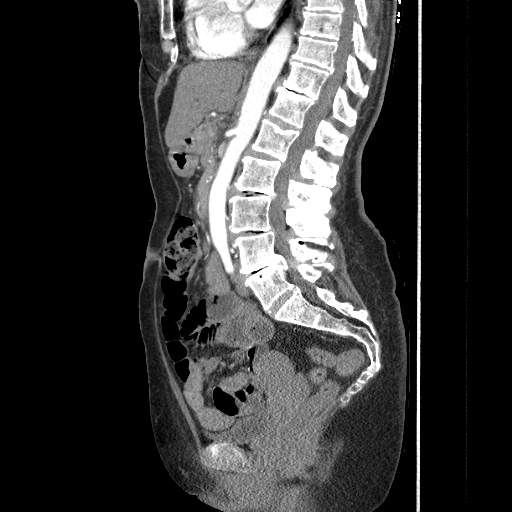
[im 109/145  soft-tissue]
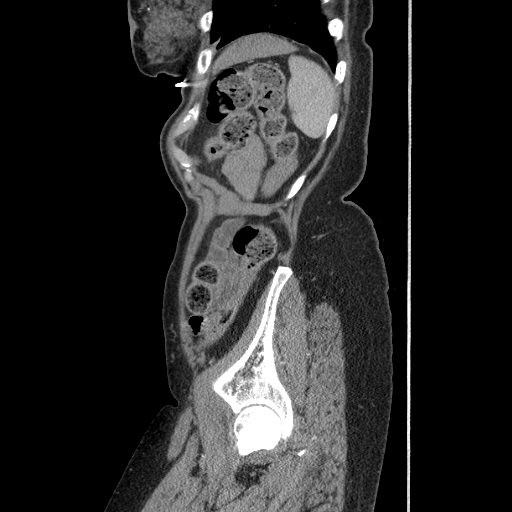

[Series 607: sagittal body · sagittal · 0.84mm/px · 3 of 145 slices shown (2 of 2)]
[im 37/145  soft-tissue]
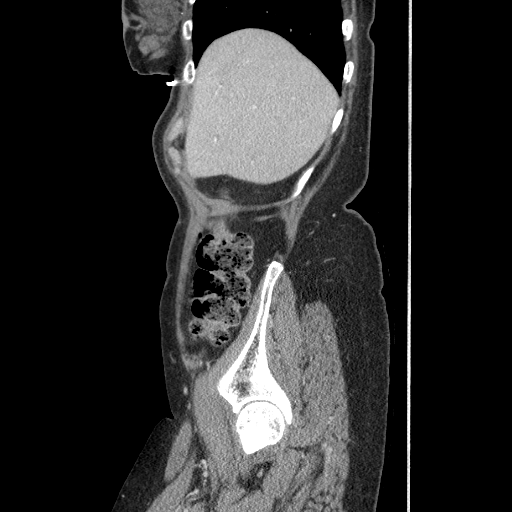
[im 73/145  soft-tissue]
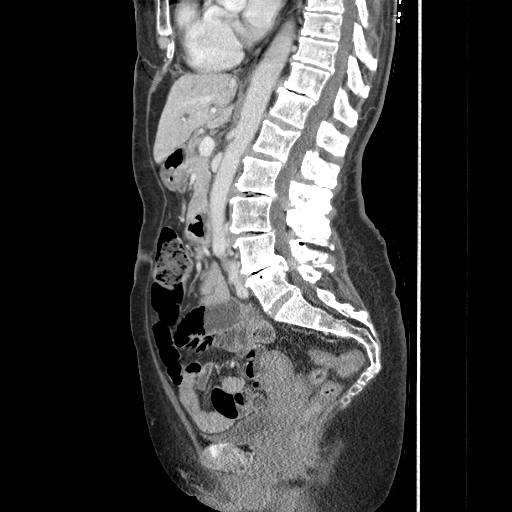
[im 109/145  soft-tissue]
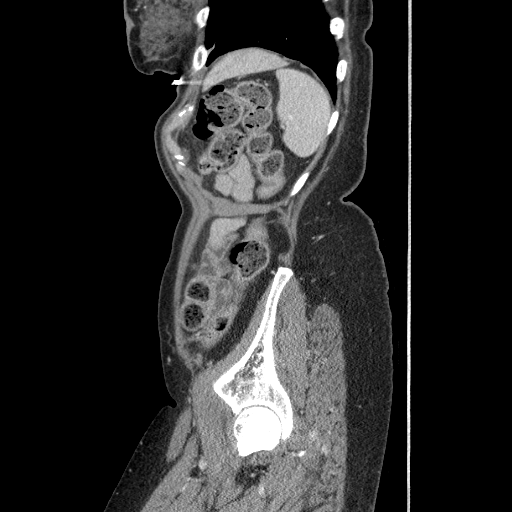

[10 of 36 positions shown; findings below may reference images not displayed]

FINDINGS: There is excellent opacification of the abdominal aorta and its
branches on the arterial phase of imaging. The abdominal aorta shows
normal caliber and patency without evidence of aneurysm, stenosis,
dissection or focal atherosclerotic plaque. On both axial and
sagittal imaging, there is significant narrowing of the proximal
celiac axis noted without evidence of focal plaque. There is visible
compression by AP median arcuate ligament at the level of the
proximal celiac trunk. The superior mesenteric artery is widely
patent. The inferior mesenteric artery is normally patent.

A single left renal artery shows normal patency. The right kidney is
supplied by 4 separate renal arteries. He tiny superior accessory
artery supplies a small portion of the upper pole. Two separate
arteries originating close proximity off the mid abdominal aorta and
supply the majority of the right kidney. An accessory lower pole
artery emanates from the proximal right common iliac artery and
supplies a small portion of the lower pole.

Bilateral iliac, common femoral and visualized proximal profunda
femoral and superficial femoral arteries show normal patency.

Venous phase imaging was also performed. There is prominence of bile
ducts in the liver as well as the extrahepatic common bile duct
which measures up to 7 mm in diameter. These changes are likely due
to prior cholecystectomy. The pancreas, spleen, adrenal glands and
kidneys are unremarkable. Visualized bowel shows no abnormalities.
No masses, enlarged lymph nodes or hernias are seen. No abnormal
fluid collections are identified. The bladder is decompressed.
Postmenopausal appearance of the uterus and adnexal regions is
unremarkable by CT. Lumbar spondylosis present throughout the lumbar
spine with associated mild scoliosis.

Review of the MIP images confirms the above findings.
IMPRESSION: 1. Significant stenosis of the proximal celiac axis and a
configuration consistent with median arcuate ligament compression.
No focal plaque is identified.
2. Widely patent superior mesenteric artery.
3. Incidental 4 separate right renal arteries.

## 2014-08-29 MED ORDER — POLYETHYLENE GLYCOL 3350 17 GM/SCOOP PO POWD: 17.0000 g | Freq: Once | ORAL | Status: AC

## 2014-08-29 MED ORDER — LANSOPRAZOLE 30 MG PO CPDR: 30.0000 mg | DELAYED_RELEASE_CAPSULE | Freq: Every day | ORAL | Status: DC

## 2014-08-29 NOTE — Discharge Instructions (Signed)

## 2014-08-29 NOTE — ED Provider Notes (Signed)
CSN: [id number]     Arrival date & time 08/28/14  2121 History   First MD Initiated Contact with Patient 08/28/14 2306     Chief Complaint  Patient presents with  . Chest Pain     (Consider location/radiation/quality/duration/timing/severity/associated sxs/prior Treatment) Patient is a 69 y.o. female presenting with abdominal pain. The history is provided by the patient.  Abdominal Pain Pain location:  Epigastric Pain quality: burning   Pain radiates to:  Does not radiate Pain severity:  Moderate Onset quality:  Sudden Duration:  6 hours Timing:  Constant Progression:  Partially resolved Chronicity:  Recurrent Context: eating   Relieved by:  Nothing Worsened by:  Nothing tried Ineffective treatments:  None tried Associated symptoms: constipation   Associated symptoms: no diarrhea, no shortness of breath and no vomiting   Risk factors: no alcohol abuse     Past Medical History  Diagnosis Date  . Allergic rhinitis   . GERD (gastroesophageal reflux disease)   . Osteoarthritis   . Heart murmur   . Cervical back pain with evidence of disc disease   . Nerve damage     left arm  . Transient global amnesia   . Abnormal LFTs   . Phlebitis   . Spinal stenosis   . Osteopenia   . Hypertension   . Abdominal pain, other specified site    Past Surgical History  Procedure Laterality Date  . Knee arthroscopy      right  . Cholecystectomy    . Total knee arthroplasty      right  . Bunionectomy      fall 2022 [facility]  . Tonsillectomy    . Foot surgery       x 2    . Replacement unicondylar joint knee      rt knee   Family History  Problem Relation Age of Onset  . COPD Mother   . Osteoporosis Mother   . Heart disease Mother   . Hypertension Brother   . Hypertension Father   . Alcohol abuse Father   . Lung cancer Father   . Prostate cancer Maternal Grandfather   . Cancer Maternal Grandmother     sinus  . Colon cancer Neg Hx   . Pancreatic cancer Neg Hx   . Stomach  cancer Neg Hx   . Valvular heart disease Brother    History  Substance Use Topics  . Smoking status: Never Smoker   . Smokeless tobacco: Never Used  . Alcohol Use: 8.4 oz/week    14 Glasses of wine per week   OB History    No data available     Review of Systems  Constitutional: Negative for diaphoresis.  Respiratory: Negative for shortness of breath.   Gastrointestinal: Positive for abdominal pain and constipation. Negative for vomiting and diarrhea.  All other systems reviewed and are negative.     Allergies  Codeine phosphate  Home Medications   Prior to Admission medications   Medication Sig Start Date End Date Taking? Authorizing Provider  acetaminophen (TYLENOL) 500 MG tablet Take 1,000 mg by mouth every 6 (six) hours as needed for mild pain, moderate pain or headache.    Yes Historical Provider, MD  aspirin 81 MG tablet Take 81 mg by mouth daily.     Yes Historical Provider, MD  calcium carbonate (OS-CAL - DOSED IN MG OF ELEMENTAL CALCIUM) 1250 (500 CA) MG tablet Take 1,000 mg by mouth daily.   Yes Historical Provider, MD  CALCIUM PO  Take 1 tablet by mouth daily.    Yes Historical Provider, MD  celecoxib (CELEBREX) 100 MG capsule Take 100 mg by mouth daily.   Yes Historical Provider, MD  clorazepate (TRANXENE) 7.5 MG tablet TAKE ONE TABLET BY MOUTH AT BEDTIME as needed Patient taking differently: Take 7.5 mg by mouth at bedtime as needed for sleep.  07/18/14  Yes Burnis Medin, MD  cycloSPORINE (RESTASIS) 0.05 % ophthalmic emulsion Place 1 drop into the right eye 2 (two) times daily. 06/23/14  Yes Historical Provider, MD  Difluprednate (DUREZOL) 0.05 % EMUL Place 1 drop into the right eye daily. 06/20/14  Yes Historical Provider, MD  fluticasone (FLONASE) 50 MCG/ACT nasal spray USE TWO SPRAYS IN EACH NOSTRIL ONCE DAILY. Patient taking differently: USE TWO SPRAYS IN EACH NOSTRIL ONCE DAILY AS NEEDED FOR ALLERGIES   Yes Burnis Medin, MD  KRILL OIL PO Take 1 tablet by mouth  daily.    Yes Historical Provider, MD  lidocaine (LIDODERM) 5 % Place 1 patch onto the skin daily as needed (pain). Remove & Discard patch within 12 hours or as directed by MD   Yes Historical Provider, MD  lisinopril (PRINIVIL,ZESTRIL) 20 MG tablet TAKE ONE TABLET BY MOUTH ONCE DAILY Patient taking differently: TAKE 20 MG BY MOUTH DAILY. 02/18/14  Yes Burnis Medin, MD  polyethylene glycol powder (MIRALAX) powder Take 17 g by mouth daily as needed for mild constipation or moderate constipation.    Yes Historical Provider, MD  sennosides-docusate sodium (SENOKOT-S) 8.6-50 MG tablet Take 1 tablet by mouth daily as needed for constipation.    Yes Historical Provider, MD  traMADol (ULTRAM) 50 MG tablet Take 50 mg by mouth 2 (two) times daily as needed for moderate pain or severe pain.    Yes Historical Provider, MD  TURMERIC PO Take 1 tablet by mouth daily.    Yes Historical Provider, MD  clorazepate (TRANXENE) 7.5 MG tablet TAKE ONE TABLET BY MOUTH ONCE DAILY AT BEDTIME AS NEEDED Patient not taking: Reported on 08/28/2014 07/18/14   Burnis Medin, MD  mirtazapine (REMERON) 15 MG tablet Take 1 tablet (15 mg total) by mouth at bedtime. May increase to 30 mg Patient not taking: Reported on 08/28/2014 01/06/14   Burnis Medin, MD  omeprazole (PRILOSEC) 20 MG capsule TAKE ONE CAPSULE BY MOUTH ONCE EVERY DAY Patient not taking: Reported on 08/28/2014 10/05/13   Lucille Passy, MD   BP 130/65 mmHg  Pulse 47  Temp(Src) 97.3 F (36.3 C) (Oral)  Resp 10  SpO2 99% Physical Exam  Constitutional: She is oriented to person, place, and time. She appears well-developed and well-nourished. No distress.  HENT:  Head: Normocephalic and atraumatic.  Mouth/Throat: Oropharynx is clear and moist.  Eyes: Conjunctivae are normal. Pupils are equal, round, and reactive to light.  Neck: Normal range of motion. Neck supple.  Cardiovascular: Normal rate, regular rhythm and intact distal pulses.   Pulmonary/Chest: Effort  normal and breath sounds normal. No respiratory distress. She has no wheezes. She has no rales.  Abdominal: She exhibits distension. There is no tenderness. There is no rebound and no guarding.  constipation  Musculoskeletal: Normal range of motion.  Neurological: She is alert and oriented to person, place, and time.  Skin: Skin is warm and dry.  Psychiatric: She has a normal mood and affect.    ED Course  Procedures (including critical care time) Labs Review Labs Reviewed  BASIC METABOLIC PANEL - Abnormal; Notable for the following:  Sodium 134 (*)    Chloride 100 (*)    BUN 24 (*)    All other components within normal limits  URINALYSIS, ROUTINE W REFLEX MICROSCOPIC (NOT AT [facility]) - Abnormal; Notable for the following:    APPearance CLOUDY (*)    All other components within normal limits  CBC  I-STAT TROPOININ, ED  Randolm Idol, ED    Imaging Review Dg Chest 2 View  08/28/2014   CLINICAL DATA:  Central chest pain and thoracic pain tonight. Nonsmoker.  EXAM: CHEST  2 VIEW  COMPARISON:  11/30/2005  FINDINGS: Normal heart size and pulmonary vascularity. No focal airspace disease or consolidation in the lungs. No blunting of costophrenic angles. No pneumothorax. Mediastinal contours appear intact. Degenerative changes in the spine. Surgical clips in the right upper quadrant.  IMPRESSION: No active cardiopulmonary disease.   Electronically Signed   By: Lucienne Capers M.D.   On: 08/28/2014 23:02   Dg Abd 1 View  08/29/2014   CLINICAL DATA:  69 year old female with constipation.  EXAM: ABDOMEN - 1 VIEW  COMPARISON:  CT dated 04/30/2013  FINDINGS: Copious amount of stool noted throughout the colon. No evidence of bowel obstruction.-- right upper quadrant cholecystectomy clips. No radiopaque calculi identified. There is degenerative changes of the spine.  IMPRESSION: Constipation.  No evidence of bowel obstruction.   Electronically Signed   By: Anner Crete M.D.   On: 08/29/2014  00:18     EKG Interpretation   Date/Time:  Thursday August 28 2014 21:39:15 EDT Ventricular Rate:  63 PR Interval:  181 QRS Duration: 97 QT Interval:  388 QTC Calculation: 397 R Axis:   19 Text Interpretation:  Sinus rhythm Confirmed by [facility]  MD, Ashante Yellin  ([id number]) on 08/28/2014 11:45:20 PM      MDM   Final diagnoses:  None    HEART SCORE 0, ruled out with 2 troponins.  Highly doubt ACS.  Symptoms are consistent with GERD, patient has a same and is off her meds.  Moreover, suspect this was exacerbated by increased abdominal pressure from constipation.  Will start both prevacid and miralax.      Veatrice Kells, MD 08/29/14 [phone]

## 2014-09-03 ENCOUNTER — Other Ambulatory Visit: Payer: Self-pay | Admitting: Internal Medicine

## 2014-11-05 ENCOUNTER — Telehealth: Payer: Self-pay | Admitting: Internal Medicine

## 2014-11-05 NOTE — Telephone Encounter (Signed)
[facility] - Client [facility]     Patient Name: Briana Giles Initial Comment Caller states, has a red patch rash on upper arm, she is going [place] on [place], wants to make sure it is taken care of 1st   DOB: [birth year]      Nurse Assessment  Nurse: Venetia Maxon, RN, Manuela Schwartz Date/Time (Eastern Time): 11/05/2014 2:53:36 PM  Confirm and document reason for call. If symptomatic, describe symptoms. ---Caller states, has a red patch rash on upper arm, she is going [place] on [place], wants to make sure it is taken care of 1st it is upper left arm large rectangular patch: couple of spots that look like a bite. inside of arm. been there since yesterday She was at her daughter's . itchy. there is a bite mark. no fever  Has the patient traveled out of the country within the last 30 days? ---No  Does the patient require triage? ---Yes  Related visit to physician within the last 2 weeks? ---No  Does the PT have any chronic conditions? (i.e. diabetes, asthma, etc.) ---Yes  List chronic conditions. ---HTN celebrex for arthritis TIA Tramadol PRN    Guidelines     Guideline Title Affirmed Question Affirmed Notes   Spider Bite - [place] [1] Red streak or red line AND [2] length > 2 inches (5 cm)    Final Disposition User   See Physician within 4 Hours (or PCP triage) Venetia Maxon, RN, Manuela Schwartz     Referrals   REFERRED TO PCP OFFICE   Disagree/Comply: Comply    APPT SCHEDULED BY  [facility] IN OFFICE FOR 11/06/14 9:15 A

## 2014-11-06 ENCOUNTER — Ambulatory Visit (INDEPENDENT_AMBULATORY_CARE_PROVIDER_SITE_OTHER): Payer: Medicare Other | Admitting: Internal Medicine

## 2014-11-06 ENCOUNTER — Encounter: Payer: Self-pay | Admitting: Internal Medicine

## 2014-11-06 VITALS — BP 122/72 | Temp 98.2°F | Ht 60.5 in | Wt 126.9 lb

## 2014-11-06 DIAGNOSIS — Z23 Encounter for immunization: Secondary | ICD-10-CM

## 2014-11-06 DIAGNOSIS — R21 Rash and other nonspecific skin eruption: Secondary | ICD-10-CM

## 2014-11-06 DIAGNOSIS — A6920 Lyme disease, unspecified: Secondary | ICD-10-CM | POA: Diagnosis not present

## 2014-11-06 MED ORDER — DOXYCYCLINE HYCLATE 100 MG PO TABS: 100.0000 mg | ORAL_TABLET | Freq: Two times a day (BID) | ORAL | Status: DC

## 2014-11-06 NOTE — Patient Instructions (Addendum)
Rash is suspicious for early lyme  .   Treat for cure  Incase .  Antibiotic for 2 weeks should resolve the issue.   Have a good trip.  Lyme Disease You may have been bitten by a tick and are to watch for the development of Lyme Disease. Lyme Disease is an infection that is caused by a bacteria The bacteria causing this disease is named Borreilia burgdorferi. If a tick is infected with this bacteria and then bites you, then Lyme Disease may occur. These ticks are carried by deer and rodents such as rabbits and mice and infest grassy as well as forested areas. Fortunately most tick bites do not cause Lyme Disease.  Lyme Disease is easier to prevent than to treat. First, covering your legs with clothing when walking in areas where ticks are possibly abundant will prevent their attachment because ticks tend to stay within inches of the ground. Second, using insecticides containing DEET can be applied on skin or clothing. Last, because it takes about 12 to 24 hours for the tick to transmit the disease after attachment to the human host, you should inspect your body for ticks twice a day when you are in areas where Lyme Disease is common. You must look thoroughly when searching for ticks. The Ixodes tick that carries Lyme Disease is very small. It is around the size of a sesame seed (picture of tick is not actual size). Removal is best done by grasping the tick by the head and pulling it out. Do not to squeeze the body of the tick. This could inject the infecting bacteria into the bite site. Wash the area of the bite with an antiseptic solution after removal.  Lyme Disease is a disease that may affect many body systems. Because of the small size of the biting tick, most people do not notice being bitten. The first sign of an infection is usually a round red rash that extends out from the center of the tick bite. The center of the lesion may be blood colored (hemorrhagic) or have tiny blisters (vesicular). Most  lesions have bright red outer borders and partial central clearing. This rash may extend out many inches in diameter, and multiple lesions may be present. Other symptoms such as fatigue, headaches, chills and fever, general achiness and swelling of lymph glands may also occur. If this first stage of the disease is left untreated, these symptoms may gradually resolve by themselves, or progressive symptoms may occur because of spread of infection to other areas of the body.  Follow up with your caregiver to have testing and treatment if you have a tick bite and you develop any of the above complaints. Your caregiver may recommend preventative (prophylactic) medications which kill bacteria (antibiotics). Once a diagnosis of Lyme Disease is made, antibiotic treatment is highly likely to cure the disease. Effective treatment of late stage Lyme Disease may require longer courses of antibiotic therapy.  MAKE SURE YOU:   Understand these instructions.  Will watch your condition.  Will get help right away if you are not doing well or get worse. Document Released: 05/09/2000 Document Revised: 04/25/2011 Document Reviewed: 07/11/2008 [facility] Patient Information 2015 [place], [place]. This information is not intended to replace advice given to you by your health care provider. Make sure you discuss any questions you have with your health care provi

## 2014-11-06 NOTE — Progress Notes (Signed)
Pre visit review using our clinic review tool, if applicable. No additional management support is needed unless otherwise documented below in the visit note.  Chief Complaint  Patient presents with  . Rash    HPI: Patient Briana Giles  comes in today for SDA for  new problem evaluation. Was visiting daughter family in [place] for over 10 days noted bit like rash left  Upper arm with surrounding erythema withot known bite sever itching  Systemic sx such as  Fever chills  New joint pains.  No rx  topicals No know tick bite but was at playground a good bit. And outside .  No rx at this time   No increase in size but large when first noted .  Going to [place] vacation in the next week.  Needs flu vaccine  ROS: See pertinent positives and negatives per HPI.  Past Medical History  Diagnosis Date  . Allergic rhinitis   . GERD (gastroesophageal reflux disease)   . Osteoarthritis   . Heart murmur   . Cervical back pain with evidence of disc disease   . Nerve damage     left arm  . Transient global amnesia   . Abnormal LFTs   . Phlebitis   . Spinal stenosis   . Osteopenia   . Hypertension   . Abdominal pain, other specified site     Family History  Problem Relation Age of Onset  . COPD Mother   . Osteoporosis Mother   . Heart disease Mother   . Hypertension Brother   . Hypertension Father   . Alcohol abuse Father   . Lung cancer Father   . Prostate cancer Maternal Grandfather   . Cancer Maternal Grandmother     sinus  . Colon cancer Neg Hx   . Pancreatic cancer Neg Hx   . Stomach cancer Neg Hx   . Valvular heart disease Brother     Social History   Social History  . Marital Status: Married    Spouse Name: N/A  . Number of Children: 2  . Years of Education: N/A   Occupational History  . retired Pharmacist, hospital   .     Social History Main Topics  . Smoking status: Never Smoker   . Smokeless tobacco: Never Used  . Alcohol Use: 8.4 oz/week    14 Glasses of wine per  week  . Drug Use: No  . Sexual Activity: Not Asked   Other Topics Concern  . 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  [place] grand child    Does water exercise aerobics walking elliptical     Outpatient Prescriptions Prior to Visit  Medication Sig Dispense Refill  . acetaminophen (TYLENOL) 500 MG tablet Take 1,000 mg by mouth every 6 (six) hours as needed for mild pain, moderate pain or headache.     Marland Kitchen aspirin 81 MG tablet Take 81 mg by mouth daily.      . calcium carbonate (OS-CAL - DOSED IN MG OF ELEMENTAL CALCIUM) 1250 (500 CA) MG tablet Take 1,000 mg by mouth daily.    Marland Kitchen CALCIUM PO Take 1 tablet by mouth daily.     . celecoxib (CELEBREX) 100 MG capsule Take 100 mg by mouth daily.    . clorazepate (TRANXENE) 7.5 MG tablet TAKE ONE TABLET BY MOUTH ONCE DAILY AT BEDTIME  AS NEEDED 90 tablet 1  . clorazepate (TRANXENE) 7.5 MG tablet TAKE ONE TABLET BY MOUTH AT BEDTIME as needed (Patient taking differently: Take 7.5 mg by mouth at bedtime as needed for sleep. ) 90 tablet 1  . cycloSPORINE (RESTASIS) 0.05 % ophthalmic emulsion Place 1 drop into the right eye 2 (two) times daily.    . Difluprednate (DUREZOL) 0.05 % EMUL Place 1 drop into the right eye daily.    . fluticasone (FLONASE) 50 MCG/ACT nasal spray USE TWO SPRAYS IN EACH NOSTRIL ONCE DAILY. (Patient taking differently: USE TWO SPRAYS IN EACH NOSTRIL ONCE DAILY AS NEEDED FOR ALLERGIES) 16 g 5  . KRILL OIL PO Take 1 tablet by mouth daily.     Marland Kitchen lidocaine (LIDODERM) 5 % Place 1 patch onto the skin daily as needed (pain). Remove & Discard patch within 12 hours or as directed by MD    . lisinopril (PRINIVIL,ZESTRIL) 20 MG tablet TAKE ONE TABLET BY MOUTH ONCE DAILY 90 tablet 0  . omeprazole (PRILOSEC) 20 MG capsule TAKE ONE CAPSULE BY MOUTH ONCE EVERY DAY 30 capsule 3  . polyethylene glycol powder (MIRALAX) powder Take 17 g by mouth  daily as needed for mild constipation or moderate constipation.     . polyethylene glycol powder (MIRALAX) powder Take 17 g by mouth once. 255 g 0  . sennosides-docusate sodium (SENOKOT-S) 8.6-50 MG tablet Take 1 tablet by mouth daily as needed for constipation.     . traMADol (ULTRAM) 50 MG tablet Take 50 mg by mouth 2 (two) times daily as needed for moderate pain or severe pain.     . TURMERIC PO Take 1 tablet by mouth daily.     . lansoprazole (PREVACID) 30 MG capsule Take 1 capsule (30 mg total) by mouth daily at 12 noon. 30 capsule 0  . mirtazapine (REMERON) 15 MG tablet Take 1 tablet (15 mg total) by mouth at bedtime. May increase to 30 mg (Patient not taking: Reported on 08/28/2014) 30 tablet 3   No facility-administered medications prior to visit.     EXAM:  BP 122/72 mmHg  Temp(Src) 98.2 F (36.8 C) (Oral)  Ht 5' 0.5" (1.537 m)  Wt 126 lb 14.4 oz (57.561 kg)  BMI 24.37 kg/m2  Body mass index is 24.37 kg/(m^2).  GENERAL: vitals reviewed and listed above, alert, oriented, appears well hydrated and in no acute distress HEENT: atraumatic, conjunctiva  clear, no obvious abnormalities on inspection of external nose and ears CV: HRRR, no clubbing cyanosis or  peripheral edema nl cap refill  Left arm point  pink  papule with confluent 8 cm roundish ovoid  Erythema  No clearing . discrete border .  No other rash noted . Non tender and no induration otherwise MS: moves all extremities without noticeable focal  Abnormality has djd changes no acute findings  PSYCH: pleasant and cooperative, no obvious depression or anxiety  ASSESSMENT AND PLAN:  Discussed the following assessment and plan:  Erythema migrans (Lyme disease) ?possible - risk location cw lyme early  empiric rx no other sx   serologies not helpful in her situtraiton  expectant mangement   Rash and nonspecific skin eruption  Encounter for immunization  Flu vaccine Could be local react to bug bite but more reminiscent of  EM  With context and presentation   Expectant management. And diff dx  Better to overtreat than miss rx .  Labs may not be helpful but optino agreed no serology. 14 days doxy ok  for flu vaccine  To travel.  -Patient advised to return or notify health care team  if symptoms worsen ,persist or new concerns arise. Total visit 48mins > 50% spent counseling and coordinating care as indicated in above note and in instructions to patient .   Patient Instructions  Rash is suspicious for early lyme  .   Treat for cure  Incase .  Antibiotic for 2 weeks should resolve the issue.   Have a good trip.  Lyme Disease You may have been bitten by a tick and are to watch for the development of Lyme Disease. Lyme Disease is an infection that is caused by a bacteria The bacteria causing this disease is named Borreilia burgdorferi. If a tick is infected with this bacteria and then bites you, then Lyme Disease may occur. These ticks are carried by deer and rodents such as rabbits and mice and infest grassy as well as forested areas. Fortunately most tick bites do not cause Lyme Disease.  Lyme Disease is easier to prevent than to treat. First, covering your legs with clothing when walking in areas where ticks are possibly abundant will prevent their attachment because ticks tend to stay within inches of the ground. Second, using insecticides containing DEET can be applied on skin or clothing. Last, because it takes about 12 to 24 hours for the tick to transmit the disease after attachment to the human host, you should inspect your body for ticks twice a day when you are in areas where Lyme Disease is common. You must look thoroughly when searching for ticks. The Ixodes tick that carries Lyme Disease is very small. It is around the size of a sesame seed (picture of tick is not actual size). Removal is best done by grasping the tick by the head and pulling it out. Do not to squeeze the body of the tick. This could inject the  infecting bacteria into the bite site. Wash the area of the bite with an antiseptic solution after removal.  Lyme Disease is a disease that may affect many body systems. Because of the small size of the biting tick, most people do not notice being bitten. The first sign of an infection is usually a round red rash that extends out from the center of the tick bite. The center of the lesion may be blood colored (hemorrhagic) or have tiny blisters (vesicular). Most lesions have bright red outer borders and partial central clearing. This rash may extend out many inches in diameter, and multiple lesions may be present. Other symptoms such as fatigue, headaches, chills and fever, general achiness and swelling of lymph glands may also occur. If this first stage of the disease is left untreated, these symptoms may gradually resolve by themselves, or progressive symptoms may occur because of spread of infection to other areas of the body.  Follow up with your caregiver to have testing and treatment if you have a tick bite and you develop any of the above complaints. Your caregiver may recommend preventative (prophylactic) medications which kill bacteria (antibiotics). Once a diagnosis of Lyme Disease is made, antibiotic treatment is highly likely to cure the disease. Effective treatment of late stage Lyme Disease may require longer courses of antibiotic therapy.  MAKE SURE YOU:   Understand these instructions.  Will watch your condition.  Will get help right away if you are not doing well or get worse. Document Released: 05/09/2000 Document Revised: 04/25/2011 Document Reviewed: 07/11/2008 [facility] Patient Information 2015 [place], [place]. This information  is not intended to replace advice given to you by your health care provider. Make sure you discuss any questions you have with your health care provi     Standley Brooking. Tiwan Schnitker M.D.

## 2014-12-02 ENCOUNTER — Other Ambulatory Visit: Payer: Self-pay | Admitting: Internal Medicine

## 2014-12-03 ENCOUNTER — Telehealth: Payer: Self-pay | Admitting: Family Medicine

## 2014-12-03 NOTE — Telephone Encounter (Signed)
Sent to the pharmacy by e-scribe.  Message sent to scheduling to help medicare wellness appt.

## 2014-12-03 NOTE — Telephone Encounter (Signed)
Pt due for medicare wellness exam in Nov.  Please help the pt to make fasting appt in next available slot.  Thanks!

## 2014-12-03 NOTE — Telephone Encounter (Signed)
Pt has been sch for 03-18-15

## 2014-12-03 NOTE — Telephone Encounter (Signed)
Left message on voicemail for pt to call back

## 2014-12-24 ENCOUNTER — Telehealth: Payer: Self-pay | Admitting: Family Medicine

## 2014-12-24 DIAGNOSIS — E2839 Other primary ovarian failure: Secondary | ICD-10-CM

## 2014-12-24 NOTE — Telephone Encounter (Signed)
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

## 2014-12-24 NOTE — Telephone Encounter (Signed)
Patient left a message on my machine stating she needs a referral for a bone density test.  Dr. August Luz informed her that the referral needs to come through [facility] office.  Diagnosis Code?

## 2014-12-25 NOTE — Telephone Encounter (Signed)
Spoke to the pt.  Advised that the order has been placed for the [place] office.  Gave her the telephone number to Fidela [place]au [phone]) and advised that she call to make an appt.

## 2015-01-10 ENCOUNTER — Other Ambulatory Visit: Payer: Self-pay | Admitting: Internal Medicine

## 2015-01-13 NOTE — Telephone Encounter (Signed)
Has appt on 03/18/15

## 2015-01-15 NOTE — Telephone Encounter (Signed)
Faxed to the pharmacy.

## 2015-01-21 ENCOUNTER — Other Ambulatory Visit: Payer: Self-pay

## 2015-01-21 DIAGNOSIS — Z1231 Encounter for screening mammogram for malignant neoplasm of breast: Secondary | ICD-10-CM

## 2015-01-21 NOTE — Telephone Encounter (Signed)
Pt called back and left a message on my machine.  Stated that she did not remember if I called her back or what to do.  Called and spoke to the pt.  Advised that we spoke on 12/25/14 and at that time I gave her the information needed to call and make an appt.  The order has been placed.  Gave her the telephone number again and she will call for an appt.

## 2015-02-11 ENCOUNTER — Ambulatory Visit (INDEPENDENT_AMBULATORY_CARE_PROVIDER_SITE_OTHER)
Admission: RE | Admit: 2015-02-11 | Discharge: 2015-02-11 | Disposition: A | Discharge status: Home / Self Care | Payer: Medicare Other | Source: Ambulatory Visit | Attending: Internal Medicine | Admitting: Internal Medicine

## 2015-02-11 DIAGNOSIS — E2839 Other primary ovarian failure: Secondary | ICD-10-CM

## 2015-02-26 ENCOUNTER — Ambulatory Visit
Admission: RE | Admit: 2015-02-26 | Discharge: 2015-02-26 | Disposition: A | Discharge status: Home / Self Care | Payer: Medicare Other | Source: Ambulatory Visit

## 2015-02-26 DIAGNOSIS — Z1231 Encounter for screening mammogram for malignant neoplasm of breast: Secondary | ICD-10-CM

## 2015-03-06 ENCOUNTER — Other Ambulatory Visit: Payer: Self-pay | Admitting: Internal Medicine

## 2015-03-06 MED ORDER — LISINOPRIL 20 MG PO TABS: 20.0000 mg | ORAL_TABLET | Freq: Every day | ORAL | Status: DC

## 2015-03-06 NOTE — Telephone Encounter (Signed)
Hard copy faxed after attempting to sent e-fax Rx.

## 2015-03-18 ENCOUNTER — Ambulatory Visit (INDEPENDENT_AMBULATORY_CARE_PROVIDER_SITE_OTHER): Payer: Medicare Other | Admitting: Internal Medicine

## 2015-03-18 ENCOUNTER — Encounter: Payer: Self-pay | Admitting: Internal Medicine

## 2015-03-18 VITALS — BP 120/74 | Temp 97.3°F | Ht 60.0 in | Wt 130.0 lb

## 2015-03-18 DIAGNOSIS — Z79899 Other long term (current) drug therapy: Secondary | ICD-10-CM | POA: Diagnosis not present

## 2015-03-18 DIAGNOSIS — Z0000 Encounter for general adult medical examination without abnormal findings: Secondary | ICD-10-CM | POA: Diagnosis not present

## 2015-03-18 DIAGNOSIS — Z1159 Encounter for screening for other viral diseases: Secondary | ICD-10-CM

## 2015-03-18 DIAGNOSIS — G479 Sleep disorder, unspecified: Secondary | ICD-10-CM

## 2015-03-18 LAB — CBC WITH DIFFERENTIAL/PLATELET
Basophils Absolute: 0 10*3/uL (ref 0.0–0.1)
Basophils Relative: 0.5 % (ref 0.0–3.0)
EOS ABS: 0.2 10*3/uL (ref 0.0–0.7)
Eosinophils Relative: 3.7 % (ref 0.0–5.0)
HEMATOCRIT: 40.6 % (ref 36.0–46.0)
Hemoglobin: 13.5 g/dL (ref 12.0–15.0)
Lymphocytes Relative: 24.2 % (ref 12.0–46.0)
Lymphs Abs: 1.5 10*3/uL (ref 0.7–4.0)
MCHC: 33.2 g/dL (ref 30.0–36.0)
MCV: 94.7 fl (ref 78.0–100.0)
MONOS PCT: 6 % (ref 3.0–12.0)
Monocytes Absolute: 0.4 10*3/uL (ref 0.1–1.0)
NEUTROS ABS: 4.1 10*3/uL (ref 1.4–7.7)
Neutrophils Relative %: 65.6 % (ref 43.0–77.0)
PLATELETS: 224 10*3/uL (ref 150.0–400.0)
RBC: 4.29 Mil/uL (ref 3.87–5.11)
RDW: 13.7 % (ref 11.5–15.5)
WBC: 6.3 10*3/uL (ref 4.0–10.5)

## 2015-03-18 LAB — BASIC METABOLIC PANEL
BUN: 30 mg/dL — ABNORMAL HIGH (ref 6–23)
CALCIUM: 10 mg/dL (ref 8.4–10.5)
CO2: 29 meq/L (ref 19–32)
Chloride: 101 mEq/L (ref 96–112)
Creatinine, Ser: 0.98 mg/dL (ref 0.40–1.20)
GFR: 59.55 mL/min — ABNORMAL LOW (ref >60.00)
Glucose, Bld: 85 mg/dL (ref 70–99)
Potassium: 5.4 mEq/L — ABNORMAL HIGH (ref 3.5–5.1)
SODIUM: 138 meq/L (ref 135–145)

## 2015-03-18 LAB — LIPID PANEL
CHOL/HDL RATIO: 3
Cholesterol: 171 mg/dL (ref 0–200)
HDL: 63.9 mg/dL (ref >39.00)
LDL Cholesterol: 94 mg/dL (ref 0–99)
NONHDL: 107.5
Triglycerides: 67 mg/dL (ref 0.0–149.0)
VLDL: 13.4 mg/dL (ref 0.0–40.0)

## 2015-03-18 LAB — HEPATIC FUNCTION PANEL
ALK PHOS: 104 U/L (ref 39–117)
ALT: 29 U/L (ref 0–35)
AST: 28 U/L (ref 0–37)
Albumin: 4.4 g/dL (ref 3.5–5.2)
Bilirubin, Direct: 0.1 mg/dL (ref 0.0–0.3)
TOTAL PROTEIN: 7 g/dL (ref 6.0–8.3)
Total Bilirubin: 0.5 mg/dL (ref 0.2–1.2)

## 2015-03-18 LAB — TSH: TSH: 1.58 u[IU]/mL (ref 0.35–4.50)

## 2015-03-18 MED ORDER — CLORAZEPATE DIPOTASSIUM 7.5 MG PO TABS: 7.5000 mg | ORAL_TABLET | Freq: Every evening | ORAL | Status: DC | PRN

## 2015-03-18 NOTE — Patient Instructions (Addendum)
Attend to sleep hygiene  Try weaning to 1/2 med or only 3 days per week use  no etoh  To help get  Natural sleep .   May have to go t hrough sleep disruption before on you own.  c  Health Maintenance, Female Adopting a healthy lifestyle and getting preventive care can go a long way to promote health and wellness. Talk with your health care provider about what schedule of regular examinations is right for you. This is a good chance for you to check in with your provider about disease prevention and staying healthy. In between checkups, there are plenty of things you can do on your own. Experts have done a lot of research about which lifestyle changes and preventive measures are most likely to keep you healthy. Ask your health care provider for more information. WEIGHT AND DIET  Eat a healthy diet  Be sure to include plenty of vegetables, fruits, low-fat dairy products, and lean protein.  Do not eat a lot of foods high in solid fats, added sugars, or salt.  Get regular exercise. This is one of the most important things you can do for your health.  Most adults should exercise for at least 150 minutes each week. The exercise should increase your heart rate and make you sweat (moderate-intensity exercise).  Most adults should also do strengthening exercises at least twice a week. This is in addition to the moderate-intensity exercise.  Maintain a healthy weight  Body mass index (BMI) is a measurement that can be used to identify possible weight problems. It estimates body fat based on height and weight. Your health care provider can help determine your BMI and help you achieve or maintain a healthy weight.  For females 81 years of age and older:   A BMI below 18.5 is considered underweight.  A BMI of 18.5 to 24.9 is normal.  A BMI of 25 to 29.9 is considered overweight.  A BMI of 30 and above is considered obese.  Watch levels of cholesterol and blood lipids  You should start  having your blood tested for lipids and cholesterol at 71 years of age, then have this test every 5 years.  You may need to have your cholesterol levels checked more often if:  Your lipid or cholesterol levels are high.  You are older than 71 years of age.  You are at high risk for heart disease.  CANCER SCREENING   Lung Cancer  Lung cancer screening is recommended for adults 63-47 years old who are at high risk for lung cancer because of a history of smoking.  A yearly low-dose CT scan of the lungs is recommended for people who:  Currently smoke.  Have quit within the past 15 years.  Have at least a 30-pack-year history of smoking. A pack year is smoking an average of one pack of cigarettes a day for 1 year.  Yearly screening should continue until it has been 15 years since you quit.  Yearly screening should stop if you develop a health problem that would prevent you from having lung cancer treatment.  Breast Cancer  Practice breast self-awareness. This means understanding how your breasts normally appear and feel.  It also means doing regular breast self-exams. Let your health care provider know about any changes, no matter how small.  If you are in your 20s or 30s, you should have a clinical breast exam (CBE) by a health care provider every 1-3 years as part of a  regular health exam.  If you are 40 or older, have a CBE every year. Also consider having a breast X-ray (mammogram) every year.  If you have a family history of breast cancer, talk to your health care provider about genetic screening.  If you are at high risk for breast cancer, talk to your health care provider about having an MRI and a mammogram every year.  Breast cancer gene (BRCA) assessment is recommended for women who have family members with BRCA-related cancers. BRCA-related cancers include:  Breast.  Ovarian.  Tubal.  Peritoneal cancers.  Results of the assessment will determine the need for  genetic counseling and BRCA1 and BRCA2 testing. Cervical Cancer Your health care provider may recommend that you be screened regularly for cancer of the pelvic organs (ovaries, uterus, and vagina). This screening involves a pelvic examination, including checking for microscopic changes to the surface of your cervix (Pap test). You may be encouraged to have this screening done every 3 years, beginning at age 94.  For women ages 21-65, health care providers may recommend pelvic exams and Pap testing every 3 years, or they may recommend the Pap and pelvic exam, combined with testing for human papilloma virus (HPV), every 5 years. Some types of HPV increase your risk of cervical cancer. Testing for HPV may also be done on women of any age with unclear Pap test results.  Other health care providers may not recommend any screening for nonpregnant women who are considered low risk for pelvic cancer and who do not have symptoms. Ask your health care provider if a screening pelvic exam is right for you.  If you have had past treatment for cervical cancer or a condition that could lead to cancer, you need Pap tests and screening for cancer for at least 20 years after your treatment. If Pap tests have been discontinued, your risk factors (such as having a new sexual partner) need to be reassessed to determine if screening should resume. Some women have medical problems that increase the chance of getting cervical cancer. In these cases, your health care provider may recommend more frequent screening and Pap tests. Colorectal Cancer  This type of cancer can be detected and often prevented.  Routine colorectal cancer screening usually begins at 71 years of age and continues through 71 years of age.  Your health care provider may recommend screening at an earlier age if you have risk factors for colon cancer.  Your health care provider may also recommend using home test kits to check for hidden blood in the  stool.  A small camera at the end of a tube can be used to examine your colon directly (sigmoidoscopy or colonoscopy). This is done to check for the earliest forms of colorectal cancer.  Routine screening usually begins at age 66.  Direct examination of the colon should be repeated every 5-10 years through 71 years of age. However, you may need to be screened more often if early forms of precancerous polyps or small growths are found. Skin Cancer  Check your skin from head to toe regularly.  Tell your health care provider about any new moles or changes in moles, especially if there is a change in a mole's shape or color.  Also tell your health care provider if you have a mole that is larger than the size of a pencil eraser.  Always use sunscreen. Apply sunscreen liberally and repeatedly throughout the day.  Protect yourself by wearing long sleeves, pants, a wide-brimmed hat,  and sunglasses whenever you are outside. HEART DISEASE, DIABETES, AND HIGH BLOOD PRESSURE   High blood pressure causes heart disease and increases the risk of stroke. High blood pressure is more likely to develop in:  People who have blood pressure in the high end of the normal range (130-139/85-89 mm Hg).  People who are overweight or obese.  People who are African American.  If you are 28-38 years of age, have your blood pressure checked every 3-5 years. If you are 85 years of age or older, have your blood pressure checked every year. You should have your blood pressure measured twice--once when you are at a hospital or clinic, and once when you are not at a hospital or clinic. Record the average of the two measurements. To check your blood pressure when you are not at a hospital or clinic, you can use:  An automated blood pressure machine at a pharmacy.  A home blood pressure monitor.  If you are between 69 years and 53 years old, ask your health care provider if you should take aspirin to prevent  strokes.  Have regular diabetes screenings. This involves taking a blood sample to check your fasting blood sugar level.  If you are at a normal weight and have a low risk for diabetes, have this test once every three years after 71 years of age.  If you are overweight and have a high risk for diabetes, consider being tested at a younger age or more often. PREVENTING INFECTION  Hepatitis B  If you have a higher risk for hepatitis B, you should be screened for this virus. You are considered at high risk for hepatitis B if:  You were born in a country where hepatitis B is common. Ask your health care provider which countries are considered high risk.  Your parents were born in a high-risk country, and you have not been immunized against hepatitis B (hepatitis B vaccine).  You have HIV or AIDS.  You use needles to inject street drugs.  You live with someone who has hepatitis B.  You have had sex with someone who has hepatitis B.  You get hemodialysis treatment.  You take certain medicines for conditions, including cancer, organ transplantation, and autoimmune conditions. Hepatitis C  Blood testing is recommended for:  Everyone born from [birth year] through 1966.  Anyone with known risk factors for hepatitis C. Sexually transmitted infections (STIs)  You should be screened for sexually transmitted infections (STIs) including gonorrhea and chlamydia if:  You are sexually active and are younger than 72 years of age.  You are older than 71 years of age and your health care provider tells you that you are at risk for this type of infection.  Your sexual activity has changed since you were last screened and you are at an increased risk for chlamydia or gonorrhea. Ask your health care provider if you are at risk.  If you do not have HIV, but are at risk, it may be recommended that you take a prescription medicine daily to prevent HIV infection. This is called pre-exposure prophylaxis  (PrEP). You are considered at risk if:  You are sexually active and do not regularly use condoms or know the HIV status of your partner(s).  You take drugs by injection.  You are sexually active with a partner who has HIV. Talk with your health care provider about whether you are at high risk of being infected with HIV. If you choose to begin PrEP, you should  first be tested for HIV. You should then be tested every 3 months for as long as you are taking PrEP.  PREGNANCY   If you are premenopausal and you may become pregnant, ask your health care provider about preconception counseling.  If you may become pregnant, take 400 to 800 micrograms (mcg) of folic acid every day.  If you want to prevent pregnancy, talk to your health care provider about birth control (contraception). OSTEOPOROSIS AND MENOPAUSE   Osteoporosis is a disease in which the bones lose minerals and strength with aging. This can result in serious bone fractures. Your risk for osteoporosis can be identified using a bone density scan.  If you are 35 years of age or older, or if you are at risk for osteoporosis and fractures, ask your health care provider if you should be screened.  Ask your health care provider whether you should take a calcium or vitamin D supplement to lower your risk for osteoporosis.  Menopause may have certain physical symptoms and risks.  Hormone replacement therapy may reduce some of these symptoms and risks. Talk to your health care provider about whether hormone replacement therapy is right for you.  HOME CARE INSTRUCTIONS   Schedule regular health, dental, and eye exams.  Stay current with your immunizations.   Do not use any tobacco products including cigarettes, chewing tobacco, or electronic cigarettes.  If you are pregnant, do not drink alcohol.  If you are breastfeeding, limit how much and how often you drink alcohol.  Limit alcohol intake to no more than 1 drink per day for  nonpregnant women. One drink equals 12 ounces of beer, 5 ounces of wine, or 1 ounces of hard liquor.  Do not use street drugs.  Do not share needles.  Ask your health care provider for help if you need support or information about quitting drugs.  Tell your health care provider if you often feel depressed.  Tell your health care provider if you have ever been abused or do not feel safe at home.   This information is not intended to replace advice given to you by your health care provider. Make sure you discuss any questions you have with your health care provider.   Document Released: 08/16/2010 Document Revised: 02/21/2014 Document Reviewed: 01/02/2013 Elsevier Interactive Patient Education 2016 [employer]  Insomnia Insomnia is a sleep disorder that makes it difficult to fall asleep or to stay asleep. Insomnia can cause tiredness (fatigue), low energy, difficulty concentrating, mood swings, and poor performance at work or school.  There are three different ways to classify insomnia:  Difficulty falling asleep.  Difficulty staying asleep.  Waking up too early in the morning. Any type of insomnia can be long-term (chronic) or short-term (acute). Both are common. Short-term insomnia usually lasts for three months or less. Chronic insomnia occurs at least three times a week for longer than three months. CAUSES  Insomnia may be caused by another condition, situation, or substance, such as:  Anxiety.  Certain medicines.  Gastroesophageal reflux disease (GERD) or other gastrointestinal conditions.  Asthma or other breathing conditions.  Restless legs syndrome, sleep apnea, or other sleep disorders.  Chronic pain.  Menopause. This may include hot flashes.  Stroke.  Abuse of alcohol, tobacco, or illegal drugs.  Depression.  Caffeine.   Neurological disorders, such as Alzheimer disease.  An overactive thyroid (hyperthyroidism). The cause of insomnia may not be  known. RISK FACTORS Risk factors for insomnia include:  Gender. Women are more commonly  affected than men.  Age. Insomnia is more common as you get older.  Stress. This may involve your professional or personal life.  Income. Insomnia is more common in people with lower income.  Lack of exercise.   Irregular work schedule or night shifts.  Traveling between different time zones. SIGNS AND SYMPTOMS If you have insomnia, trouble falling asleep or trouble staying asleep is the main symptom. This may lead to other symptoms, such as:  Feeling fatigued.  Feeling nervous about going to sleep.  Not feeling rested in the morning.  Having trouble concentrating.  Feeling irritable, anxious, or depressed. TREATMENT  Treatment for insomnia depends on the cause. If your insomnia is caused by an underlying condition, treatment will focus on addressing the condition. Treatment may also include:   Medicines to help you sleep.  Counseling or therapy.  Lifestyle adjustments. HOME CARE INSTRUCTIONS   Take medicines only as directed by your health care provider.  Keep regular sleeping and waking hours. Avoid naps.  Keep a sleep diary to help you and your health care provider figure out what could be causing your insomnia. Include:   When you sleep.  When you wake up during the night.  How well you sleep.   How rested you feel the next day.  Any side effects of medicines you are taking.  What you eat and drink.   Make your bedroom a comfortable place where it is easy to fall asleep:  Put up shades or special blackout curtains to block light from outside.  Use a white noise machine to block noise.  Keep the temperature cool.   Exercise regularly as directed by your health care provider. Avoid exercising right before bedtime.  Use relaxation techniques to manage stress. Ask your health care provider to suggest some techniques that may work well for you. These may  include:  Breathing exercises.  Routines to release muscle tension.  Visualizing peaceful scenes.  Cut back on alcohol, caffeinated beverages, and cigarettes, especially close to bedtime. These can disrupt your sleep.  Do not overeat or eat spicy foods right before bedtime. This can lead to digestive discomfort that can make it hard for you to sleep.  Limit screen use before bedtime. This includes:  Watching TV.  Using your smartphone, tablet, and computer.  Stick to a routine. This can help you fall asleep faster. Try to do a quiet activity, brush your teeth, and go to bed at the same time each night.  Get out of bed if you are still awake after 15 minutes of trying to sleep. Keep the lights down, but try reading or doing a quiet activity. When you feel sleepy, go back to bed.  Make sure that you drive carefully. Avoid driving if you feel very sleepy.  Keep all follow-up appointments as directed by your health care provider. This is important. SEEK MEDICAL CARE IF:   You are tired throughout the day or have trouble in your daily routine due to sleepiness.  You continue to have sleep problems or your sleep problems get worse. SEEK IMMEDIATE MEDICAL CARE IF:   You have serious thoughts about hurting yourself or someone else.   This information is not intended to replace advice given to you by your health care provider. Make sure you discuss any questions you have with your health care provider.   Document Released: 01/29/2000 Document Revised: 10/22/2014 Document Reviewed: 11/01/2013 Elsevier Interactive Patient Education [employer].

## 2015-03-18 NOTE — Progress Notes (Signed)
Chief Complaint  Patient presents with  . Medicare Wellness    wants refill tranzene     HPI: Briana Giles 70 y.o. comes in today for Preventive Medicare wellness visit .Since last visit. And Chronic disease management  OA djd   PT  .  Sees rheum  Still taking tranxene  At night to help with sleep  Active  Son in [place] currently  Fall was hiking down trail and not related to balance  cv neuro issues.    Health Maintenance  Topic Date Due  . Hepatitis C Screening  [birth year]  . INFLUENZA VACCINE  09/15/2015  . MAMMOGRAM  02/25/2017  . COLONOSCOPY  12/29/2022  . TETANUS/TDAP  01/07/2024  . DEXA SCAN  Completed  . ZOSTAVAX  Completed  . PNA vac Low Risk Adult  Completed   Health Maintenance Review LIFESTYLE:  TAD  Walks 5 miles 3-4 x per week and yoga . Sugar beverages: no Sleep: about 6.5  7  MEDICARE DOCUMENT QUESTIONS  TO SCAN   Hearing: good  Vision:  No limitations at present . Last eye check UTD  Safety:  Has smoke detector and wears seat belts.  No firearms. No excess sun exposure. Sees dentist regularly.  Falls:   Advance directive :  Reviewed  Has one.  Memory: Felt to be good  , no concern from her or her family.  Depression: No anhedonia unusual crying or depressive symptoms  Nutrition: Eats well balanced diet; adequate calcium and vitamin D. No swallowing chewing problems.  Injury: no major injuries in the last six months.  Other healthcare providers:  Reviewed today .  Social:  Lives with spouse married. 2 cats    Preventive parameters: up-to-date  Reviewed   ADLS:   There are no problems or need for assistance  driving, feeding, obtaining food, dressing, toileting and bathing, managing money using phone. She is independent.  EXERCISE/ HABITS  Per week   No tobacco    etoh  ROS:  GEN/ HEENT: No fever, significant weight changes sweats headaches vision problems hearing changes, CV/ PULM; No chest pain shortness of breath cough,  syncope,edema  change in exercise tolerance. GI /GU: No adominal pain, vomiting, change in bowel habits. No blood in the stool. No significant GU symptoms. SKIN/HEME: ,no acute skin rashes suspicious lesions or bleeding. No lymphadenopathy, nodules, masses.  NEURO/ PSYCH:  No new  neurologic signs such as weakness  Pt for numbness ms sx  No depression anxiety. IMM/ Allergy: No unusual infections.  Allergy .   REST of 12 system review negative except as per HPI   Past Medical History  Diagnosis Date  . Allergic rhinitis   . GERD (gastroesophageal reflux disease)   . Osteoarthritis   . Heart murmur   . Cervical back pain with evidence of disc disease   . Nerve damage     left arm  . Transient global amnesia   . Abnormal LFTs   . Phlebitis   . Spinal stenosis   . Osteopenia   . Hypertension   . Abdominal pain, other specified site     Family History  Problem Relation Age of Onset  . COPD Mother   . Osteoporosis Mother   . Heart disease Mother   . Hypertension Brother   . Hypertension Father   . Alcohol abuse Father   . Lung cancer Father   . Prostate cancer Maternal Grandfather   . Cancer Maternal Grandmother     sinus  .  Colon cancer Neg Hx   . Pancreatic cancer Neg Hx   . Stomach cancer Neg Hx   . Valvular heart disease Brother     Social History   Social History  . Marital Status: Married    Spouse Name: N/A  . Number of Children: 2  . Years of Education: N/A   Occupational History  . retired Runner, broadcasting/film/video   .     Social History Main Topics  . Smoking status: Never Smoker   . Smokeless tobacco: Never Used  . Alcohol Use: 8.4 oz/week    14 Glasses of wine per week  . Drug Use: No  . Sexual Activity: Not Asked   Other Topics Concern  . 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  [place] grand child    Does water exercise aerobics walking elliptical      Outpatient Encounter Prescriptions as of 03/18/2015  Medication Sig  . acetaminophen (TYLENOL) 500 MG tablet Take 1,000 mg by mouth every 6 (six) hours as needed for mild pain, moderate pain or headache.   Marland Kitchen aspirin 81 MG tablet Take 81 mg by mouth daily.    . calcium carbonate (OS-CAL - DOSED IN MG OF ELEMENTAL CALCIUM) 1250 (500 CA) MG tablet Take 1,000 mg by mouth daily.  . celecoxib (CELEBREX) 100 MG capsule Take 100 mg by mouth daily.  . clorazepate (TRANXENE) 7.5 MG tablet Take 1 tablet (7.5 mg total) by mouth at bedtime as needed. Avoid regular use  . cycloSPORINE (RESTASIS) 0.05 % ophthalmic emulsion Place 1 drop into the right eye 2 (two) times daily.  . fluticasone (FLONASE) 50 MCG/ACT nasal spray USE TWO SPRAYS IN EACH NOSTRIL ONCE DAILY. (Patient taking differently: USE TWO SPRAYS IN EACH NOSTRIL ONCE DAILY AS NEEDED FOR ALLERGIES)  . lidocaine (LIDODERM) 5 % Place 1 patch onto the skin daily as needed (pain). Remove & Discard patch within 12 hours or as directed by MD  . lisinopril (PRINIVIL,ZESTRIL) 20 MG tablet Take 1 tablet (20 mg total) by mouth daily.  Marland Kitchen omeprazole (PRILOSEC) 20 MG capsule TAKE ONE CAPSULE BY MOUTH ONCE EVERY DAY  . polyethylene glycol powder (MIRALAX) powder Take 17 g by mouth daily as needed for mild constipation or moderate constipation.   . polyethylene glycol powder (MIRALAX) powder Take 17 g by mouth once.  . sennosides-docusate sodium (SENOKOT-S) 8.6-50 MG tablet Take 1 tablet by mouth daily as needed for constipation.   . traMADol (ULTRAM) 50 MG tablet Take 50 mg by mouth 2 (two) times daily as needed for moderate pain or severe pain.   . TURMERIC PO Take 1 tablet by mouth daily.   . [DISCONTINUED] clorazepate (TRANXENE) 7.5 MG tablet TAKE ONE TABLET BY MOUTH AT BEDTIME AS NEEDED  . [DISCONTINUED] CALCIUM PO Take 1 tablet by mouth daily.   . [DISCONTINUED] clorazepate (TRANXENE) 7.5 MG tablet TAKE ONE TABLET BY MOUTH ONCE DAILY AT BEDTIME AS NEEDED   . [DISCONTINUED] clorazepate (TRANXENE) 7.5 MG tablet TAKE ONE TABLET BY MOUTH AT BEDTIME as needed (Patient taking differently: Take 7.5 mg by mouth at bedtime as needed for sleep. )  . [DISCONTINUED] Difluprednate (DUREZOL) 0.05 % EMUL Place 1 drop into the right eye daily.  . [DISCONTINUED] doxycycline (VIBRA-TABS) 100 MG tablet Take 1 tablet (100 mg total) by mouth 2 (two) times daily. For 14 days  . [  DISCONTINUED] KRILL OIL PO Take 1 tablet by mouth daily.    No facility-administered encounter medications on file as of 03/18/2015.    EXAM:  BP 120/74 mmHg  Temp(Src) 97.3 F (36.3 C) (Oral)  Ht 5' (1.524 m)  Wt 130 lb (58.968 kg)  BMI 25.39 kg/m2  Body mass index is 25.39 kg/(m^2).  Physical Exam: Vital signs reviewed ZOX:WRUE is a well-developed well-nourished alert cooperative   who appears stated age in no acute distress.  HEENT: normocephalic atraumatic , Eyes: PERRL EOM's full, conjunctiva clear, Nares: paten,t no deformity discharge or tenderness., Ears: no deformity EAC's clear TMs with normal landmarks. Mouth: clear OP, no lesions, edema.  Moist mucous membranes. Dentition in adequate repair. NECK: supple without masses, thyromegaly or bruits. CHEST/PULM:  Clear to auscultation and percussion breath sounds equal no wheeze , rales or rhonchi. No chest wall deformities or tenderness. CV: PMI is nondisplaced, S1 S2 no gallops, murmurs, rubs. Peripheral pulses are full without delay.No JVD . Breast: normal by inspection . No dimpling, discharge, masses, tenderness or discharge . ABDOMEN: Bowel sounds normal nontender  No guard or rebound, no hepato splenomegal no CVA tenderness.   Extremtities:  No clubbing cyanosis or edema, no acute joint swelling or redness no focal atrophy djd changes  NEURO:  Oriented x3, cranial nerves 3-12 appear to be intact, no obvious focal weakness,gait within normal limits no abnormal reflexes or asymmetrical SKIN: No acute rashes normal turgor,  color, no bruising or petechiae. PSYCH: Oriented, good eye contact, no obvious depression anxiety, cognition and judgment appear normal. LN: no cervical axillary inguinal adenopathy No noted deficits in memory, attention, and speech.     ASSESSMENT AND PLAN:  Discussed the following assessment and plan:  Visit for preventive health examination - Plan: Basic metabolic panel, CBC with Differential/Platelet, Hepatic function panel, Lipid panel, TSH  Medicare annual wellness visit, subsequent  Medication management - Plan: Basic metabolic panel, CBC with Differential/Platelet, Hepatic function panel, Lipid panel, TSH  Need for hepatitis C screening test - Plan: Hepatitis C antibody  Disturbance in sleep behavior  Chronic prescription benzodiazepine use  Risk benefit of medication discussed. Exp at her age although she is  Healthy disc risk cognition falls etc   tranxene for night   Disc try ti wean: take half and sleep hygiene  Or limit i 3-4 night per week instead of nightly   .  Patient Care Team: Madelin Headings, MD as PCP - General Lenn Sink, DPM (Podiatry) Pollyann Savoy, MD (Rheumatology) Nadara Mustard, MD (Orthopedic Surgery) Rachael Fee, MD (Gastroenterology) Elmon Else, MD as Attending Physician (Dermatology) Elliot Dally, MD as Referring Physician (Orthopedic Surgery) Nadara Mustard, MD as Attending Physician (Orthopedic Surgery)  Patient Instructions    Attend to sleep hygiene  Try weaning to 1/2 med or only 3 days per week use  no etoh  To help get  Natural sleep .   May have to go t hrough sleep disruption before on you own.  c  Health Maintenance, Female Adopting a healthy lifestyle and getting preventive care can go a long way to promote health and wellness. Talk with your health care provider about what schedule of regular examinations is right for you. This is a good chance for you to check in with your provider about disease prevention and staying  healthy. In between checkups, there are plenty of things you can do on your own. Experts have done a lot of research about which lifestyle  changes and preventive measures are most likely to keep you healthy. Ask your health care provider for more information. WEIGHT AND DIET  Eat a healthy diet  Be sure to include plenty of vegetables, fruits, low-fat dairy products, and lean protein.  Do not eat a lot of foods high in solid fats, added sugars, or salt.  Get regular exercise. This is one of the most important things you can do for your health.  Most adults should exercise for at least 150 minutes each week. The exercise should increase your heart rate and make you sweat (moderate-intensity exercise).  Most adults should also do strengthening exercises at least twice a week. This is in addition to the moderate-intensity exercise.  Maintain a healthy weight  Body mass index (BMI) is a measurement that can be used to identify possible weight problems. It estimates body fat based on height and weight. Your health care provider can help determine your BMI and help you achieve or maintain a healthy weight.  For females 65 years of age and older:   A BMI below 18.5 is considered underweight.  A BMI of 18.5 to 24.9 is normal.  A BMI of 25 to 29.9 is considered overweight.  A BMI of 30 and above is considered obese.  Watch levels of cholesterol and blood lipids  You should start having your blood tested for lipids and cholesterol at 71 years of age, then have this test every 5 years.  You may need to have your cholesterol levels checked more often if:  Your lipid or cholesterol levels are high.  You are older than 71 years of age.  You are at high risk for heart disease.  CANCER SCREENING   Lung Cancer  Lung cancer screening is recommended for adults 19-7 years old who are at high risk for lung cancer because of a history of smoking.  A yearly low-dose CT scan of the lungs is  recommended for people who:  Currently smoke.  Have quit within the past 15 years.  Have at least a 30-pack-year history of smoking. A pack year is smoking an average of one pack of cigarettes a day for 1 year.  Yearly screening should continue until it has been 15 years since you quit.  Yearly screening should stop if you develop a health problem that would prevent you from having lung cancer treatment.  Breast Cancer  Practice breast self-awareness. This means understanding how your breasts normally appear and feel.  It also means doing regular breast self-exams. Let your health care provider know about any changes, no matter how small.  If you are in your 20s or 30s, you should have a clinical breast exam (CBE) by a health care provider every 1-3 years as part of a regular health exam.  If you are 7 or older, have a CBE every year. Also consider having a breast X-ray (mammogram) every year.  If you have a family history of breast cancer, talk to your health care provider about genetic screening.  If you are at high risk for breast cancer, talk to your health care provider about having an MRI and a mammogram every year.  Breast cancer gene (BRCA) assessment is recommended for women who have family members with BRCA-related cancers. BRCA-related cancers include:  Breast.  Ovarian.  Tubal.  Peritoneal cancers.  Results of the assessment will determine the need for genetic counseling and BRCA1 and BRCA2 testing. Cervical Cancer Your health care provider may recommend that you be screened  regularly for cancer of the pelvic organs (ovaries, uterus, and vagina). This screening involves a pelvic examination, including checking for microscopic changes to the surface of your cervix (Pap test). You may be encouraged to have this screening done every 3 years, beginning at age 55.  For women ages 63-65, health care providers may recommend pelvic exams and Pap testing every 3 years, or  they may recommend the Pap and pelvic exam, combined with testing for human papilloma virus (HPV), every 5 years. Some types of HPV increase your risk of cervical cancer. Testing for HPV may also be done on women of any age with unclear Pap test results.  Other health care providers may not recommend any screening for nonpregnant women who are considered low risk for pelvic cancer and who do not have symptoms. Ask your health care provider if a screening pelvic exam is right for you.  If you have had past treatment for cervical cancer or a condition that could lead to cancer, you need Pap tests and screening for cancer for at least 20 years after your treatment. If Pap tests have been discontinued, your risk factors (such as having a new sexual partner) need to be reassessed to determine if screening should resume. Some women have medical problems that increase the chance of getting cervical cancer. In these cases, your health care provider may recommend more frequent screening and Pap tests. Colorectal Cancer  This type of cancer can be detected and often prevented.  Routine colorectal cancer screening usually begins at 71 years of age and continues through 71 years of age.  Your health care provider may recommend screening at an earlier age if you have risk factors for colon cancer.  Your health care provider may also recommend using home test kits to check for hidden blood in the stool.  A small camera at the end of a tube can be used to examine your colon directly (sigmoidoscopy or colonoscopy). This is done to check for the earliest forms of colorectal cancer.  Routine screening usually begins at age 62.  Direct examination of the colon should be repeated every 5-10 years through 71 years of age. However, you may need to be screened more often if early forms of precancerous polyps or small growths are found. Skin Cancer  Check your skin from head to toe regularly.  Tell your health care  provider about any new moles or changes in moles, especially if there is a change in a mole's shape or color.  Also tell your health care provider if you have a mole that is larger than the size of a pencil eraser.  Always use sunscreen. Apply sunscreen liberally and repeatedly throughout the day.  Protect yourself by wearing long sleeves, pants, a wide-brimmed hat, and sunglasses whenever you are outside. HEART DISEASE, DIABETES, AND HIGH BLOOD PRESSURE   High blood pressure causes heart disease and increases the risk of stroke. High blood pressure is more likely to develop in:  People who have blood pressure in the high end of the normal range (130-139/85-89 mm Hg).  People who are overweight or obese.  People who are African American.  If you are 40-30 years of age, have your blood pressure checked every 3-5 years. If you are 37 years of age or older, have your blood pressure checked every year. You should have your blood pressure measured twice--once when you are at a hospital or clinic, and once when you are not at a hospital or clinic. Record  the average of the two measurements. To check your blood pressure when you are not at a hospital or clinic, you can use:  An automated blood pressure machine at a pharmacy.  A home blood pressure monitor.  If you are between 46 years and 24 years old, ask your health care provider if you should take aspirin to prevent strokes.  Have regular diabetes screenings. This involves taking a blood sample to check your fasting blood sugar level.  If you are at a normal weight and have a low risk for diabetes, have this test once every three years after 71 years of age.  If you are overweight and have a high risk for diabetes, consider being tested at a younger age or more often. PREVENTING INFECTION  Hepatitis B  If you have a higher risk for hepatitis B, you should be screened for this virus. You are considered at high risk for hepatitis B  if:  You were born in a country where hepatitis B is common. Ask your health care provider which countries are considered high risk.  Your parents were born in a high-risk country, and you have not been immunized against hepatitis B (hepatitis B vaccine).  You have HIV or AIDS.  You use needles to inject street drugs.  You live with someone who has hepatitis B.  You have had sex with someone who has hepatitis B.  You get hemodialysis treatment.  You take certain medicines for conditions, including cancer, organ transplantation, and autoimmune conditions. Hepatitis C  Blood testing is recommended for:  Everyone born from [birth year] through 1966.  Anyone with known risk factors for hepatitis C. Sexually transmitted infections (STIs)  You should be screened for sexually transmitted infections (STIs) including gonorrhea and chlamydia if:  You are sexually active and are younger than 71 years of age.  You are older than 71 years of age and your health care provider tells you that you are at risk for this type of infection.  Your sexual activity has changed since you were last screened and you are at an increased risk for chlamydia or gonorrhea. Ask your health care provider if you are at risk.  If you do not have HIV, but are at risk, it may be recommended that you take a prescription medicine daily to prevent HIV infection. This is called pre-exposure prophylaxis (PrEP). You are considered at risk if:  You are sexually active and do not regularly use condoms or know the HIV status of your partner(s).  You take drugs by injection.  You are sexually active with a partner who has HIV. Talk with your health care provider about whether you are at high risk of being infected with HIV. If you choose to begin PrEP, you should first be tested for HIV. You should then be tested every 3 months for as long as you are taking PrEP.  PREGNANCY   If you are premenopausal and you may become  pregnant, ask your health care provider about preconception counseling.  If you may become pregnant, take 400 to 800 micrograms (mcg) of folic acid every day.  If you want to prevent pregnancy, talk to your health care provider about birth control (contraception). OSTEOPOROSIS AND MENOPAUSE   Osteoporosis is a disease in which the bones lose minerals and strength with aging. This can result in serious bone fractures. Your risk for osteoporosis can be identified using a bone density scan.  If you are 56 years of age or older, or if  you are at risk for osteoporosis and fractures, ask your health care provider if you should be screened.  Ask your health care provider whether you should take a calcium or vitamin D supplement to lower your risk for osteoporosis.  Menopause may have certain physical symptoms and risks.  Hormone replacement therapy may reduce some of these symptoms and risks. Talk to your health care provider about whether hormone replacement therapy is right for you.  HOME CARE INSTRUCTIONS   Schedule regular health, dental, and eye exams.  Stay current with your immunizations.   Do not use any tobacco products including cigarettes, chewing tobacco, or electronic cigarettes.  If you are pregnant, do not drink alcohol.  If you are breastfeeding, limit how much and how often you drink alcohol.  Limit alcohol intake to no more than 1 drink per day for nonpregnant women. One drink equals 12 ounces of beer, 5 ounces of wine, or 1 ounces of hard liquor.  Do not use street drugs.  Do not share needles.  Ask your health care provider for help if you need support or information about quitting drugs.  Tell your health care provider if you often feel depressed.  Tell your health care provider if you have ever been abused or do not feel safe at home.   This information is not intended to replace advice given to you by your health care provider. Make sure you discuss any  questions you have with your health care provider.   Document Released: 08/16/2010 Document Revised: 02/21/2014 Document Reviewed: 01/02/2013 Elsevier Interactive Patient Education 2016 [employer]  Insomnia Insomnia is a sleep disorder that makes it difficult to fall asleep or to stay asleep. Insomnia can cause tiredness (fatigue), low energy, difficulty concentrating, mood swings, and poor performance at work or school.  There are three different ways to classify insomnia:  Difficulty falling asleep.  Difficulty staying asleep.  Waking up too early in the morning. Any type of insomnia can be long-term (chronic) or short-term (acute). Both are common. Short-term insomnia usually lasts for three months or less. Chronic insomnia occurs at least three times a week for longer than three months. CAUSES  Insomnia may be caused by another condition, situation, or substance, such as:  Anxiety.  Certain medicines.  Gastroesophageal reflux disease (GERD) or other gastrointestinal conditions.  Asthma or other breathing conditions.  Restless legs syndrome, sleep apnea, or other sleep disorders.  Chronic pain.  Menopause. This may include hot flashes.  Stroke.  Abuse of alcohol, tobacco, or illegal drugs.  Depression.  Caffeine.   Neurological disorders, such as Alzheimer disease.  An overactive thyroid (hyperthyroidism). The cause of insomnia may not be known. RISK FACTORS Risk factors for insomnia include:  Gender. Women are more commonly affected than men.  Age. Insomnia is more common as you get older.  Stress. This may involve your professional or personal life.  Income. Insomnia is more common in people with lower income.  Lack of exercise.   Irregular work schedule or night shifts.  Traveling between different time zones. SIGNS AND SYMPTOMS If you have insomnia, trouble falling asleep or trouble staying asleep is the main symptom. This may lead to other  symptoms, such as:  Feeling fatigued.  Feeling nervous about going to sleep.  Not feeling rested in the morning.  Having trouble concentrating.  Feeling irritable, anxious, or depressed. TREATMENT  Treatment for insomnia depends on the cause. If your insomnia is caused by an underlying condition, treatment will focus  on addressing the condition. Treatment may also include:   Medicines to help you sleep.  Counseling or therapy.  Lifestyle adjustments. HOME CARE INSTRUCTIONS   Take medicines only as directed by your health care provider.  Keep regular sleeping and waking hours. Avoid naps.  Keep a sleep diary to help you and your health care provider figure out what could be causing your insomnia. Include:   When you sleep.  When you wake up during the night.  How well you sleep.   How rested you feel the next day.  Any side effects of medicines you are taking.  What you eat and drink.   Make your bedroom a comfortable place where it is easy to fall asleep:  Put up shades or special blackout curtains to block light from outside.  Use a white noise machine to block noise.  Keep the temperature cool.   Exercise regularly as directed by your health care provider. Avoid exercising right before bedtime.  Use relaxation techniques to manage stress. Ask your health care provider to suggest some techniques that may work well for you. These may include:  Breathing exercises.  Routines to release muscle tension.  Visualizing peaceful scenes.  Cut back on alcohol, caffeinated beverages, and cigarettes, especially close to bedtime. These can disrupt your sleep.  Do not overeat or eat spicy foods right before bedtime. This can lead to digestive discomfort that can make it hard for you to sleep.  Limit screen use before bedtime. This includes:  Watching TV.  Using your smartphone, tablet, and computer.  Stick to a routine. This can help you fall asleep faster.  Try to do a quiet activity, brush your teeth, and go to bed at the same time each night.  Get out of bed if you are still awake after 15 minutes of trying to sleep. Keep the lights down, but try reading or doing a quiet activity. When you feel sleepy, go back to bed.  Make sure that you drive carefully. Avoid driving if you feel very sleepy.  Keep all follow-up appointments as directed by your health care provider. This is important. SEEK MEDICAL CARE IF:   You are tired throughout the day or have trouble in your daily routine due to sleepiness.  You continue to have sleep problems or your sleep problems get worse. SEEK IMMEDIATE MEDICAL CARE IF:   You have serious thoughts about hurting yourself or someone else.   This information is not intended to replace advice given to you by your health care provider. Make sure you discuss any questions you have with your health care provider.   Document Released: 01/29/2000 Document Revised: 10/22/2014 Document Reviewed: 11/01/2013 Elsevier Interactive Patient Education 2016 [employer].     [place]. Blossom Crume M.D.

## 2015-03-19 LAB — HEPATITIS C ANTIBODY: HCV AB: NEGATIVE

## 2015-03-30 ENCOUNTER — Ambulatory Visit (INDEPENDENT_AMBULATORY_CARE_PROVIDER_SITE_OTHER): Payer: Medicare Other | Admitting: Internal Medicine

## 2015-03-30 ENCOUNTER — Encounter: Payer: Self-pay | Admitting: Internal Medicine

## 2015-03-30 VITALS — BP 120/66 | Temp 97.9°F | Ht 60.0 in | Wt 130.0 lb

## 2015-03-30 DIAGNOSIS — J309 Allergic rhinitis, unspecified: Secondary | ICD-10-CM | POA: Insufficient documentation

## 2015-03-30 DIAGNOSIS — J029 Acute pharyngitis, unspecified: Secondary | ICD-10-CM

## 2015-03-30 DIAGNOSIS — J3089 Other allergic rhinitis: Secondary | ICD-10-CM

## 2015-03-30 NOTE — Progress Notes (Signed)
Pre visit review using our clinic review tool, if applicable. No additional management support is needed unless otherwise documented below in the visit note.   Chief Complaint  Patient presents with  . Sore Throat    Started yesterday.  . Nasal Congestion  . Itchy Ears    HPI: Patient Briana Giles  comes in today for SDA for  new problem evaluation. Onset  For 1-2 days  Allergies  But  going to granddaughter on Wednesday.  [place]  Right side hurst last pm but  better with gargle  Remote hx of strep . tichy ear right more than left no fever swollen glands   Exposure to uri last week no strep exp.  Wants opinion because of travel plans ROS: See pertinent positives and negatives per HPI.  Past Medical History  Diagnosis Date  . Allergic rhinitis   . GERD (gastroesophageal reflux disease)   . Osteoarthritis   . Heart murmur   . Cervical back pain with evidence of disc disease   . Nerve damage     left arm  . Transient global amnesia   . Abnormal LFTs   . Phlebitis   . Spinal stenosis   . Osteopenia   . Hypertension   . Abdominal pain, other specified site     Family History  Problem Relation Age of Onset  . COPD Mother   . Osteoporosis Mother   . Heart disease Mother   . Hypertension Brother   . Hypertension Father   . Alcohol abuse Father   . Lung cancer Father   . Prostate cancer Maternal Grandfather   . Cancer Maternal Grandmother     sinus  . Colon cancer Neg Hx   . Pancreatic cancer Neg Hx   . Stomach cancer Neg Hx   . Valvular heart disease Brother     Social History   Social History  . Marital Status: Married    Spouse Name: N/A  . Number of Children: 2  . Years of Education: N/A   Occupational History  . retired Pharmacist, hospital   .     Social History Main Topics  . Smoking status: Never Smoker   . Smokeless tobacco: Never Used  . Alcohol Use: 8.4 oz/week    14 Glasses of wine per week  . Drug Use: No  . Sexual Activity: Not Asked   Other Topics  Concern  . 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  [place] grand child    Does water exercise aerobics walking elliptical     Outpatient Prescriptions Prior to Visit  Medication Sig Dispense Refill  . acetaminophen (TYLENOL) 500 MG tablet Take 1,000 mg by mouth every 6 (six) hours as needed for mild pain, moderate pain or headache.     Marland Kitchen aspirin 81 MG tablet Take 81 mg by mouth daily.      . calcium carbonate (OS-CAL - DOSED IN MG OF ELEMENTAL CALCIUM) 1250 (500 CA) MG tablet Take 1,000 mg by mouth daily.    . celecoxib (CELEBREX) 100 MG capsule Take 100 mg by mouth daily.    . clorazepate (TRANXENE) 7.5 MG tablet Take 1 tablet (7.5 mg total) by mouth at bedtime as needed. Avoid regular use 90 tablet 0  . cycloSPORINE (RESTASIS) 0.05 % ophthalmic emulsion Place 1 drop into the right eye 2 (two)  times daily.    . fluticasone (FLONASE) 50 MCG/ACT nasal spray USE TWO SPRAYS IN EACH NOSTRIL ONCE DAILY. (Patient taking differently: USE TWO SPRAYS IN EACH NOSTRIL ONCE DAILY AS NEEDED FOR ALLERGIES) 16 g 5  . lisinopril (PRINIVIL,ZESTRIL) 20 MG tablet Take 1 tablet (20 mg total) by mouth daily. 90 tablet 0  . omeprazole (PRILOSEC) 20 MG capsule TAKE ONE CAPSULE BY MOUTH ONCE EVERY DAY 30 capsule 3  . polyethylene glycol powder (MIRALAX) powder Take 17 g by mouth once. 255 g 0  . sennosides-docusate sodium (SENOKOT-S) 8.6-50 MG tablet Take 1 tablet by mouth daily as needed for constipation.     . traMADol (ULTRAM) 50 MG tablet Take 50 mg by mouth 2 (two) times daily as needed for moderate pain or severe pain.     . TURMERIC PO Take 1 tablet by mouth daily.     . polyethylene glycol powder (MIRALAX) powder Take 17 g by mouth daily as needed for mild constipation or moderate constipation.     . lidocaine (LIDODERM) 5 % Place 1 patch onto the skin daily as needed (pain). Reported on 03/30/2015      No facility-administered medications prior to visit.     EXAM:  BP 120/66 mmHg  Temp(Src) 97.9 F (36.6 C) (Oral)  Ht 5' (1.524 m)  Wt 130 lb (58.968 kg)  BMI 25.39 kg/m2  Body mass index is 25.39 kg/(m^2).  GENERAL: vitals reviewed and listed above, alert, oriented, appears well hydrated and in no acute distress mild congestion  Looks well  HEENT: atraumatic, conjunctiva  clear, no obvious abnormalities on inspection of external nose and ears tms nl lm poss retracted canals clear OP : no lesion edema or exudate  1+ pink red post pharlateral  no  Edema no exudate   NECK: no obvious masses on inspection palpation no tenders s LUNGS: clear to auscultation bilaterally, no wheezes, rales or rhonchi, good air movement PSYCH: pleasant and cooperative, no obvious depression or anxiety  ASSESSMENT AND PLAN:  Discussed the following assessment and plan:  Sore throat - poss viral vs allergic better today exp managmentrx,  decision to not to rs today cnot clinically indicated  sx rx allergy possible   -Patient advised to return or notify health care team  if symptoms worsen ,persist or new concerns arise.  Patient Instructions  This is probable  Viral or allergy shore throat . Symptomatic  Treatment   Doesn't seem to be strep infection .      Standley Brooking. Mrytle Bento M.D.

## 2015-03-30 NOTE — Patient Instructions (Signed)
This is probable  Viral or allergy shore throat . Symptomatic  Treatment   Doesn't seem to be strep infection .

## 2015-04-13 ENCOUNTER — Telehealth: Payer: Self-pay | Admitting: Internal Medicine

## 2015-04-13 NOTE — Telephone Encounter (Signed)
Left a message on home/cell informing the pt of below message.

## 2015-04-13 NOTE — Telephone Encounter (Signed)
Virus could possible  be somewhat communicable  If coughing   But if wear a mask and wash hands should be  Limited risk of transmission

## 2015-04-13 NOTE — Telephone Encounter (Signed)
Went to the ER Sat they prescribed Amoxicillin, she still has a head congestion,ears hurting and coughing.  She is gong to her in-laws today, will she be contagious please call her and let her know. Thank you.

## 2015-05-02 ENCOUNTER — Other Ambulatory Visit: Payer: Self-pay | Admitting: Internal Medicine

## 2015-05-04 NOTE — Telephone Encounter (Signed)
Sent to the pharmacy by e-scribe. 

## 2015-09-14 NOTE — Progress Notes (Signed)
Pre visit review using our clinic review tool, if applicable. No additional management support is needed unless otherwise documented below in the visit note.  Chief Complaint  Patient presents with  . Follow-up    HPI: Briana Giles 70 y.o.  Fu med check see last notes :  Off the  Tranxene  Weaned off  ocass use of somniex   No change in memeory   Uses tramadol and celbrex  If exercises. celebrex most days of the week    prilosec   As needed  About  Not that often  Every  Couple months .   Allergies  Seem ok ocass cough . Scratchy cough .   Not every day    See dr D due for lfts .  ROS: See pertinent positives and negatives per HPI.   ocass to rare  Urge incontinence usually when waits too long to void.  No utis sx  etoh wine with dinner not a lot of other soda etc  Working on [employer]   Past Medical History:  Diagnosis Date  . Abdominal pain, other specified site   . Abnormal LFTs   . Allergic rhinitis   . Cervical back pain with evidence of disc disease   . GERD (gastroesophageal reflux disease)   . Heart murmur   . Hypertension   . Nerve damage    left arm  . Osteoarthritis   . Osteopenia   . Phlebitis   . Spinal stenosis   . Transient global amnesia     Family History  Problem Relation Age of Onset  . COPD Mother   . Osteoporosis Mother   . Heart disease Mother   . Hypertension Brother   . Hypertension Father   . Alcohol abuse Father   . Lung cancer Father   . Prostate cancer Maternal Grandfather   . Cancer Maternal Grandmother     sinus  . Colon cancer Neg Hx   . Pancreatic cancer Neg Hx   . Stomach cancer Neg Hx   . Valvular heart disease Brother     Social History   Social History  . Marital status: Married    Spouse name: N/A  . Number of children: 2  . Years of education: N/A   Occupational History  . retired Pharmacist, hospital   .  Retired   Social History Main Topics  . Smoking status: Never Smoker  . Smokeless tobacco: Never Used  . Alcohol  use 8.4 oz/week    14 Glasses of wine per week  . Drug use: No  . Sexual activity: Not Asked   Other Topics Concern  . 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  [place] grand child    Does water exercise aerobics walking elliptical     Outpatient Medications Prior to Visit  Medication Sig Dispense Refill  . acetaminophen (TYLENOL) 500 MG tablet Take 1,000 mg by mouth every 6 (six) hours as needed for mild pain, moderate pain or headache.     Marland Kitchen aspirin 81 MG tablet Take 81 mg by mouth daily.      . calcium carbonate (OS-CAL - DOSED IN MG OF ELEMENTAL CALCIUM) 1250 (500 CA) MG tablet Take 1,000 mg by mouth daily.    . celecoxib (CELEBREX) 100 MG capsule Take 100 mg by mouth daily.    . cycloSPORINE (RESTASIS)  0.05 % ophthalmic emulsion Place 1 drop into the right eye 2 (two) times daily.    . fluticasone (FLONASE) 50 MCG/ACT nasal spray USE TWO SPRAYS IN EACH NOSTRIL ONCE DAILY. (Patient taking differently: USE TWO SPRAYS IN EACH NOSTRIL ONCE DAILY AS NEEDED FOR ALLERGIES) 16 g 5  . lidocaine (LIDODERM) 5 % Place 1 patch onto the skin daily as needed (pain). Reported on 03/30/2015    . lisinopril (PRINIVIL,ZESTRIL) 20 MG tablet TAKE ONE TABLET BY MOUTH ONCE DAILY 90 tablet 1  . omeprazole (PRILOSEC) 20 MG capsule TAKE ONE CAPSULE BY MOUTH ONCE EVERY DAY 30 capsule 3  . polyethylene glycol powder (MIRALAX) powder Take 17 g by mouth once. 255 g 0  . sennosides-docusate sodium (SENOKOT-S) 8.6-50 MG tablet Take 1 tablet by mouth daily as needed for constipation.     . traMADol (ULTRAM) 50 MG tablet Take 50 mg by mouth 2 (two) times daily as needed for moderate pain or severe pain.     . TURMERIC PO Take 1 tablet by mouth daily.     . clorazepate (TRANXENE) 7.5 MG tablet Take 1 tablet (7.5 mg total) by mouth at bedtime as needed. Avoid regular use 90 tablet 0   No facility-administered  medications prior to visit.      EXAM:  BP 128/70 (BP Location: Right Arm, Patient Position: Sitting, Cuff Size: Normal)   Temp 98.2 F (36.8 C) (Oral)   Ht 5' (1.524 m)   Wt 127 lb 14.4 oz (58 kg)   BMI 24.98 kg/m   Body mass index is 24.98 kg/m.  GENERAL: vitals reviewed and listed above, alert, oriented, appears well hydrated and in no acute distress HEENT: atraumatic, conjunctiva  clear, no obvious abnormalities on inspection of external nose and ears OP : no lesion edema or exudate  NECK: no obvious masses on inspection palpation  LUNGS: clear to auscultation bilaterally, no wheezes, rales or rhonchi, good air movement CV: HRRR, no clubbing cyanosis or  peripheral edema nl cap refill   1-2 /6 sem usb when laying right  Not heard in upright and no carotid bruits heard  MS: moves all extremities   Nl gait   PSYCH: pleasant and cooperative, no obvious depression or anxiety Lab Results  Component Value Date   WBC 6.3 03/18/2015   HGB 13.5 03/18/2015   HCT 40.6 03/18/2015   PLT 224.0 03/18/2015   GLUCOSE 86 09/15/2015   CHOL 171 03/18/2015   TRIG 67.0 03/18/2015   HDL 63.90 03/18/2015   LDLDIRECT 127.4 12/27/2011   LDLCALC 94 03/18/2015   ALT 19 09/15/2015   AST 21 09/15/2015   NA 139 09/15/2015   K 4.8 09/15/2015   CL 103 09/15/2015   CREATININE 0.98 09/15/2015   BUN 28 (H) 09/15/2015   CO2 29 09/15/2015   TSH 1.58 03/18/2015    ASSESSMENT AND PLAN:  Discussed the following assessment and plan:  Essential hypertension - Plan: Basic metabolic panel, Hepatic function panel  Medication management - Plan: Basic metabolic panel, Hepatic function panel  NSAID long-term use cox 2  Pain in joint of left shoulder - hx same cat sat on shoulder for a while  dop exercises as in past   If cough  persistent consider   Med as cause  But pt thinks allergy. Glad she is off the benzo  For now disc otc meds also have some risk    Feels celbrex more helpful for function will  follow renal function.  Exercises  for ocass  ui sx  And fu if  persistent or progressive  -Patient advised to return or notify health care team  if symptoms worsen ,persist or new concerns arise.  Patient Instructions  Will notify you  of labs when available. Liver and  Renal function.    rov with wellness   Check cpx  Next February    2018   Shoulder Range of Motion Exercises Shoulder range of motion (ROM) exercises are designed to keep the shoulder moving freely. They are often recommended for people who have shoulder pain. MOVEMENT EXERCISE When you are able, do this exercise 5-6 days per week, or as told by your health care provider. Work toward doing 2 sets of 10 swings. Pendulum Exercise How To Do This Exercise Lying Down 1. Lie face-down on a bed with your abdomen close to the side of the bed. 2. Let your arm hang over the side of the bed. 3. Relax your shoulder, arm, and hand. 4. Slowly and gently swing your arm forward and back. Do not use your neck muscles to swing your arm. They should be relaxed. If you are struggling to swing your arm, have someone gently swing it for you. When you do this exercise for the first time, swing your arm at a 15 degree angle for 15 seconds, or swing your arm 10 times. As pain lessens over time, increase the angle of the swing to 30-45 degrees. 5. Repeat steps 1-4 with the other arm. How To Do This Exercise While Standing 1. Stand next to a sturdy chair or table and hold on to it with your hand.  Bend forward at the waist.  Bend your knees slightly.  Relax your other arm and let it hang limp.  Relax the shoulder blade of the arm that is hanging and let it drop.  While keeping your shoulder relaxed, use body motion to swing your arm in small circles. The first time you do this exercise, swing your arm for about 30 seconds or 10 times. When you do it next time, swing your arm for a little longer.  Stand up tall and relax.  Repeat steps 1-7,  this time changing the direction of the circles. 2. Repeat steps 1-8 with the other arm. STRETCHING EXERCISES Do these exercises 3-4 times per day on 5-6 days per week or as told by your health care provider. Work toward holding the stretch for 20 seconds. Stretching Exercise 1 1. Lift your arm straight out in front of you. 2. Bend your arm 90 degrees at the elbow (right angle) so your forearm goes across your body and looks like the letter "L." 3. Use your other arm to gently pull the elbow forward and across your body. 4. Repeat steps 1-3 with the other arm. Stretching Exercise 2 You will need a towel or rope for this exercise. 1. Bend one arm behind your back with the palm facing outward. 2. Hold a towel with your other hand. 3. Reach the arm that holds the towel above your head, and bend that arm at the elbow. Your wrist should be behind your neck. 4. Use your free hand to grab the free end of the towel. 5. With the higher hand, gently pull the towel up behind you. 6. With the lower hand, pull the towel down behind you. 7. Repeat steps 1-6 with the other arm. STRENGTHENING EXERCISES Do each of these exercises at four different times of day (sessions) every day or as told  by your health care provider. To begin with, repeat each exercise 5 times (repetitions). Work toward doing 3 sets of 12 repetitions or as told by your health care provider. Strengthening Exercise 1 You will need a light weight for this activity. As you grow stronger, you may use a heavier weight. 1. Standing with a weight in your hand, lift your arm straight out to the side until it is at the same height as your shoulder. 2. Bend your arm at 90 degrees so that your fingers are pointing to the ceiling. 3. Slowly raise your hand until your arm is straight up in the air. 4. Repeat steps 1-3 with the other arm. Strengthening Exercise 2 You will need a light weight for this activity. As you grow stronger, you may use a  heavier weight. 1. Standing with a weight in your hand, gradually move your straight arm in an arc, starting at your side, then out in front of you, then straight up over your head. 2. Gradually move your other arm in an arc, starting at your side, then out in front of you, then straight up over your head. 3. Repeat steps 1-2 with the other arm. Strengthening Exercise 3 You will need an elastic band for this activity. As you grow stronger, gradually increase the size of the bands or increase the number of bands that you use at one time. 1. While standing, hold an elastic band in one hand and raise that arm up in the air. 2. With your other hand, pull down the band until that hand is by your side. 3. Repeat steps 1-2 with the other arm.   This information is not intended to replace advice given to you by your health care provider. Make sure you discuss any questions you have with your health care provider.   Document Released: 10/30/2002 Document Revised: 06/17/2014 Document Reviewed: 01/27/2014 Elsevier Interactive Patient Education 2016 Leighton K. Panosh M.D.

## 2015-09-15 ENCOUNTER — Ambulatory Visit (INDEPENDENT_AMBULATORY_CARE_PROVIDER_SITE_OTHER): Payer: Medicare Other | Admitting: Internal Medicine

## 2015-09-15 ENCOUNTER — Encounter: Payer: Self-pay | Admitting: Internal Medicine

## 2015-09-15 VITALS — BP 128/70 | Temp 98.2°F | Ht 60.0 in | Wt 127.9 lb

## 2015-09-15 DIAGNOSIS — I10 Essential (primary) hypertension: Secondary | ICD-10-CM | POA: Diagnosis not present

## 2015-09-15 DIAGNOSIS — Z791 Long term (current) use of non-steroidal anti-inflammatories (NSAID): Secondary | ICD-10-CM | POA: Diagnosis not present

## 2015-09-15 DIAGNOSIS — M25512 Pain in left shoulder: Secondary | ICD-10-CM

## 2015-09-15 DIAGNOSIS — Z79899 Other long term (current) drug therapy: Secondary | ICD-10-CM

## 2015-09-15 LAB — HEPATIC FUNCTION PANEL
ALBUMIN: 4.5 g/dL (ref 3.5–5.2)
ALT: 19 U/L (ref 0–35)
AST: 21 U/L (ref 0–37)
Alkaline Phosphatase: 93 U/L (ref 39–117)
Bilirubin, Direct: 0.1 mg/dL (ref 0.0–0.3)
TOTAL PROTEIN: 7.2 g/dL (ref 6.0–8.3)
Total Bilirubin: 0.5 mg/dL (ref 0.2–1.2)

## 2015-09-15 LAB — BASIC METABOLIC PANEL
BUN: 28 mg/dL — AB (ref 6–23)
CHLORIDE: 103 meq/L (ref 96–112)
CO2: 29 mEq/L (ref 19–32)
Calcium: 9.8 mg/dL (ref 8.4–10.5)
Creatinine, Ser: 0.98 mg/dL (ref 0.40–1.20)
GFR: 59.46 mL/min — ABNORMAL LOW (ref >60.00)
GLUCOSE: 86 mg/dL (ref 70–99)
POTASSIUM: 4.8 meq/L (ref 3.5–5.1)
Sodium: 139 mEq/L (ref 135–145)

## 2015-09-15 NOTE — Patient Instructions (Addendum)
Will notify you  of labs when available. Liver and  Renal function.    rov with wellness   Check cpx  Next February    2018   Shoulder Range of Motion Exercises Shoulder range of motion (ROM) exercises are designed to keep the shoulder moving freely. They are often recommended for people who have shoulder pain. MOVEMENT EXERCISE When you are able, do this exercise 5-6 days per week, or as told by your health care provider. Work toward doing 2 sets of 10 swings. Pendulum Exercise How To Do This Exercise Lying Down 1. Lie face-down on a bed with your abdomen close to the side of the bed. 2. Let your arm hang over the side of the bed. 3. Relax your shoulder, arm, and hand. 4. Slowly and gently swing your arm forward and back. Do not use your neck muscles to swing your arm. They should be relaxed. If you are struggling to swing your arm, have someone gently swing it for you. When you do this exercise for the first time, swing your arm at a 15 degree angle for 15 seconds, or swing your arm 10 times. As pain lessens over time, increase the angle of the swing to 30-45 degrees. 5. Repeat steps 1-4 with the other arm. How To Do This Exercise While Standing 1. Stand next to a sturdy chair or table and hold on to it with your hand.  Bend forward at the waist.  Bend your knees slightly.  Relax your other arm and let it hang limp.  Relax the shoulder blade of the arm that is hanging and let it drop.  While keeping your shoulder relaxed, use body motion to swing your arm in small circles. The first time you do this exercise, swing your arm for about 30 seconds or 10 times. When you do it next time, swing your arm for a little longer.  Stand up tall and relax.  Repeat steps 1-7, this time changing the direction of the circles. 2. Repeat steps 1-8 with the other arm. STRETCHING EXERCISES Do these exercises 3-4 times per day on 5-6 days per week or as told by your health care provider. Work toward  holding the stretch for 20 seconds. Stretching Exercise 1 1. Lift your arm straight out in front of you. 2. Bend your arm 90 degrees at the elbow (right angle) so your forearm goes across your body and looks like the letter "L." 3. Use your other arm to gently pull the elbow forward and across your body. 4. Repeat steps 1-3 with the other arm. Stretching Exercise 2 You will need a towel or rope for this exercise. 1. Bend one arm behind your back with the palm facing outward. 2. Hold a towel with your other hand. 3. Reach the arm that holds the towel above your head, and bend that arm at the elbow. Your wrist should be behind your neck. 4. Use your free hand to grab the free end of the towel. 5. With the higher hand, gently pull the towel up behind you. 6. With the lower hand, pull the towel down behind you. 7. Repeat steps 1-6 with the other arm. STRENGTHENING EXERCISES Do each of these exercises at four different times of day (sessions) every day or as told by your health care provider. To begin with, repeat each exercise 5 times (repetitions). Work toward doing 3 sets of 12 repetitions or as told by your health care provider. Strengthening Exercise 1 You will need  a light weight for this activity. As you grow stronger, you may use a heavier weight. 1. Standing with a weight in your hand, lift your arm straight out to the side until it is at the same height as your shoulder. 2. Bend your arm at 90 degrees so that your fingers are pointing to the ceiling. 3. Slowly raise your hand until your arm is straight up in the air. 4. Repeat steps 1-3 with the other arm. Strengthening Exercise 2 You will need a light weight for this activity. As you grow stronger, you may use a heavier weight. 1. Standing with a weight in your hand, gradually move your straight arm in an arc, starting at your side, then out in front of you, then straight up over your head. 2. Gradually move your other arm in an arc,  starting at your side, then out in front of you, then straight up over your head. 3. Repeat steps 1-2 with the other arm. Strengthening Exercise 3 You will need an elastic band for this activity. As you grow stronger, gradually increase the size of the bands or increase the number of bands that you use at one time. 1. While standing, hold an elastic band in one hand and raise that arm up in the air. 2. With your other hand, pull down the band until that hand is by your side. 3. Repeat steps 1-2 with the other arm.   This information is not intended to replace advice given to you by your health care provider. Make sure you discuss any questions you have with your health care provider.   Document Released: 10/30/2002 Document Revised: 06/17/2014 Document Reviewed: 01/27/2014 Elsevier Interactive Patient Education [employer].

## 2015-11-18 ENCOUNTER — Other Ambulatory Visit: Payer: Self-pay | Admitting: Internal Medicine

## 2015-11-19 NOTE — Telephone Encounter (Signed)
Sent to the pharmacy by e-scribe for 6 months.  Pt has upcoming cpx on 03/25/16.

## 2015-12-07 ENCOUNTER — Other Ambulatory Visit: Payer: Self-pay | Admitting: Radiology

## 2015-12-07 ENCOUNTER — Telehealth: Payer: Self-pay | Admitting: Rheumatology

## 2015-12-07 MED ORDER — CELECOXIB 100 MG PO CAPS: 100.0000 mg | ORAL_CAPSULE | Freq: Every day | ORAL | 2 refills | Status: DC

## 2015-12-07 MED ORDER — CELECOXIB 100 MG PO CAPS
100.0000 mg | ORAL_CAPSULE | Freq: Every day | ORAL | 2 refills | Status: DC
Start: 2015-12-07 — End: 2017-06-14

## 2015-12-07 NOTE — Telephone Encounter (Signed)
Left message for patient to call me back. 

## 2015-12-07 NOTE — Telephone Encounter (Signed)
Pt needs a refill of Celebrex today she will come by today and pick up rx. Pt cb# [phone]

## 2015-12-07 NOTE — Telephone Encounter (Signed)
Please ask patient to reduce Celebrex dose to 100 mg once a day only due to reduced GFR thank you

## 2015-12-07 NOTE — Telephone Encounter (Signed)
Advised patient of lower dose recommendation and have resent to her new pharmacy per her request

## 2015-12-07 NOTE — Telephone Encounter (Signed)
06/22/15 last visit  01/12/16 next visit Kidney function a bit decreased in August Ok to refill Celebrex ? Pended / have set to print pt requesting written rx

## 2016-01-05 ENCOUNTER — Ambulatory Visit: Payer: Self-pay | Admitting: Rheumatology

## 2016-03-16 ENCOUNTER — Other Ambulatory Visit: Payer: Self-pay | Admitting: Rheumatology

## 2016-03-16 NOTE — Telephone Encounter (Signed)
Last Visit: 06/22/15 Next Visit was due November 2017. Message sent to the front to schedule patient.  Labs: 09/15/15 WNL  Okay to refill Celebrex?

## 2016-03-25 ENCOUNTER — Encounter: Payer: Medicare Other | Admitting: Internal Medicine

## 2016-03-29 NOTE — Progress Notes (Signed)
Pre visit review using our clinic review tool, if applicable. No additional management support is needed unless otherwise documented below in the visit note.  Chief Complaint  Patient presents with  . Medicare Wellness    HPI: Briana Giles 71 y.o. comes in today for Preventive Medicare wellness visit and exam me dmangement  .Since last visit. HT controlled  No se of med Takes Celebrex alt with ibu if needed. Exercises well. Not taking reg asa   Health Maintenance  Topic Date Due  . MAMMOGRAM  02/25/2017  . COLONOSCOPY  12/29/2022  . TETANUS/TDAP  01/07/2024  . INFLUENZA VACCINE  Addressed  . DEXA SCAN  Completed  . ZOSTAVAX  Completed  . Hepatitis C Screening  Completed  . PNA vac Low Risk Adult  Completed   Health Maintenance Review LIFESTYLE:  TAD  2 wine per night  Sugar beverages: Giles:same  hh  2 3 cats     MEDICARE DOCUMENT QUESTIONS  TO SCAN    Hearing: ok  Vision:  No limitations at present . Last eye check UTD  Safety:  Has smoke detector and wears seat belts.  No firearms. No excess sun exposure. Sees dentist regularly.  Falls: tripped hiking  Tree root  Pinky finger  Injury doing ok was distracted   Advance directive :  Reviewed  Has one.  Memory: Felt to be stable , no concern from her or her family.  Depression: No anhedonia unusual crying or depressive symptoms  Nutrition: Eats well balanced diet; adequate calcium and vitamin D. No swallowing chewing problems.  Injury: no major injuries in the last six months.  Other healthcare providers:  Reviewed today .  Social:  Lives with spouse married. cats  Preventive parameters: up-to-date  Reviewed   ADLS:   There are no problems or need for assistance  driving, feeding, obtaining food, dressing, toileting and bathing, managing money using phone. She is independent.    ROS:  Had  Pink on tissue after loose stools ? Blood not really in stool  utd on colon screen uses patch pre exercise  Ms  back etc  GEN/ HEENT: No fever, significant weight changes sweats headaches vision problems hearing changes, CV/ PULM; No chest pain shortness of breath cough, syncope,edema  change in exercise tolerance. GI /GU: No adominal pain, vomiting, change in bowel habits. . No significant GU symptoms. SKIN/HEME: ,no acute skin rashes suspicious lesions or bleeding. No lymphadenopathy, nodules, masses.  NEURO/ PSYCH:  No neurologic signs such as weakness numbness. No depression anxiety. IMM/ Allergy: No unusual infections.  Allergy .   REST of 12 system review negative except as per HPI   Past Medical History:  Diagnosis Date  . Abdominal pain, other specified site   . Abnormal LFTs   . Allergic rhinitis   . Cervical back pain with evidence of disc disease   . GERD (gastroesophageal reflux disease)   . Heart murmur   . Hypertension   . Nerve damage    left arm  . Osteoarthritis   . Osteopenia   . Phlebitis   . Spinal stenosis   . Transient global amnesia     Family History  Problem Relation Age of Onset  . COPD Mother   . Osteoporosis Mother   . Heart disease Mother   . Hypertension Brother   . Hypertension Father   . Alcohol abuse Father   . Lung cancer Father   . Prostate cancer Maternal Grandfather   . Cancer Maternal  Grandmother     sinus  . Colon cancer Neg Hx   . Pancreatic cancer Neg Hx   . Stomach cancer Neg Hx   . Valvular heart disease Brother     Social History   Social History  . Marital status: Married    Spouse name: N/A  . Number of children: 2  . Years of education: N/A   Occupational History  . retired Pharmacist, hospital   .  Retired   Social History Main Topics  . Smoking status: Never Smoker  . Smokeless tobacco: Never Used  . Alcohol use 8.4 oz/week    14 Glasses of wine per week  . Drug use: No  . Sexual activity: Not Asked   Other Topics Concern  . 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  [place] grand child    Does water exercise aerobics walking elliptical     Outpatient Encounter Prescriptions as of 03/30/2016  Medication Sig  . acetaminophen (TYLENOL) 500 MG tablet Take 1,000 mg by mouth every 6 (six) hours as needed for mild pain, moderate pain or headache.   Marland Kitchen aspirin 81 MG tablet Take 81 mg by mouth daily.    . calcium carbonate (OS-CAL - DOSED IN MG OF ELEMENTAL CALCIUM) 1250 (500 CA) MG tablet Take 1,000 mg by mouth daily.  . celecoxib (CELEBREX) 100 MG capsule Take 1 capsule (100 mg total) by mouth daily.  . celecoxib (CELEBREX) 100 MG capsule TAKE 1 CAPSULE(100 MG) BY MOUTH DAILY  . cycloSPORINE (RESTASIS) 0.05 % ophthalmic emulsion Place 1 drop into the right eye 2 (two) times daily.  . fluticasone (FLONASE) 50 MCG/ACT nasal spray USE TWO SPRAYS IN EACH NOSTRIL ONCE DAILY. (Patient taking differently: USE TWO SPRAYS IN EACH NOSTRIL ONCE DAILY AS NEEDED FOR ALLERGIES)  . lidocaine (LIDODERM) 5 % Place 1 patch onto the skin daily as needed (pain). Reported on 03/30/2015  . lisinopril (PRINIVIL,ZESTRIL) 20 MG tablet TAKE ONE TABLET BY MOUTH ONCE DAILY  . omeprazole (PRILOSEC) 20 MG capsule TAKE ONE CAPSULE BY MOUTH ONCE EVERY DAY  . polyethylene glycol powder (MIRALAX) powder Take 17 g by mouth once.  . sennosides-docusate sodium (SENOKOT-S) 8.6-50 MG tablet Take 1 tablet by mouth daily as needed for constipation.   . traMADol (ULTRAM) 50 MG tablet Take 50 mg by mouth 2 (two) times daily as needed for moderate pain or severe pain.   . TURMERIC PO Take 1 tablet by mouth daily.    No facility-administered encounter medications on file as of 03/30/2016.     EXAM:  BP 124/74 (BP Location: Right Arm, Patient Position: Sitting, Cuff Size: Normal)   Temp 97.5 F (36.4 C) (Oral)   Ht 5' (1.524 m)   Wt 131 lb (59.4 kg)   BMI 25.58 kg/m   Body mass index is 25.58 kg/m.  Physical Exam: Vital signs reviewed MQK:MMNO is a  well-developed well-nourished alert cooperative   who appears stated age in no acute distress.  HEENT: normocephalic atraumatic , Eyes: PERRL EOM's full, conjunctiva clear, Nares: paten,t no deformity discharge or tenderness., Ears: no deformity EAC's clear TMs with normal landmarks. Mouth: clear OP, no lesions, edema.  Moist mucous membranes. Dentition in adequate repair. NECK: supple without masses, thyromegaly or bruits. CHEST/PULM:  Clear to auscultation and percussion breath sounds equal no wheeze , rales or  rhonchi. No chest wall deformities or tenderness. CV: PMI is nondisplaced, S1 S2 no gallops, murmurs, rubs. Peripheral pulses are full without delay.No JVD .  ABDOMEN: Bowel sounds normal nontender  No guard or rebound, no hepato splenomegal no CVA tenderness.   Extremtities:  No clubbing cyanosis or edema, no acute joint swelling or redness no focal atrophy NEURO:  Oriented x3, cranial nerves 3-12 appear to be intact, no obvious focal weakness,gait within normal limits no abnormal reflexes or asymmetrical SKIN: No acute rashes normal turgor, color, no bruising or petechiae. PSYCH: Oriented, good eye contact, no obvious depression anxiety, cognition and judgment appear normal. Rectal no lesion   No blood no masses nl exam ? Sam tag? LN: no cervical axillary inguinal adenopathy No noted deficits in memory, attention, and speech.   Lab Results  Component Value Date   WBC 6.3 03/18/2015   HGB 13.5 03/18/2015   HCT 40.6 03/18/2015   PLT 224.0 03/18/2015   GLUCOSE 86 09/15/2015   CHOL 171 03/18/2015   TRIG 67.0 03/18/2015   HDL 63.90 03/18/2015   LDLDIRECT 127.4 12/27/2011   LDLCALC 94 03/18/2015   ALT 19 09/15/2015   AST 21 09/15/2015   NA 139 09/15/2015   K 4.8 09/15/2015   CL 103 09/15/2015   CREATININE 0.98 09/15/2015   BUN 28 (H) 09/15/2015   CO2 29 09/15/2015   TSH 1.58 03/18/2015    ASSESSMENT AND PLAN:  Discussed the following assessment and plan:  Visit for  preventive health examination - Plan: CBC with Differential/Platelet, Lipid panel, TSH, Hepatic function panel, Basic metabolic panel  Medicare annual wellness visit, subsequent  Medication management - Plan: CBC with Differential/Platelet, Lipid panel, TSH, Hepatic function panel, Basic metabolic panel  Essential hypertension - controlled - Plan: CBC with Differential/Platelet, Lipid panel, TSH, Hepatic function panel, Basic metabolic panel  NSAID long-term use cox 2 - Plan: CBC with Differential/Platelet, Lipid panel, TSH, Hepatic function panel, Basic metabolic panel  Osteoarthritis, unspecified osteoarthritis type, unspecified site - Plan: CBC with Differential/Platelet, Lipid panel, TSH, Hepatic function panel, Basic metabolic panel Will come back for blood work monitoring renal function etc. Discussion continue healthy lifestyle topicals for nail health. Follow-up with GI or 6 she was having true rectal bleeding or changes in bowel habits that are of concern. Patient Care Team: Burnis Medin, MD as PCP - General Wallene Huh, DPM (Podiatry) Bo Merino, MD (Rheumatology) Milus Banister, MD (Gastroenterology) Jari Pigg, MD as Attending Physician (Dermatology) Laurance Flatten, MD as Referring Physician (Orthopedic Surgery)  Patient Instructions  Glad you are doing well   If having blood in stool need fu  Gi here etc.   Due for  Blood monitoring  = chemistry ( BMP)   And if ok   Dr D can do this if needed  Well check in a year  Unless problem or  Lab fu needed   Fu dexa in   1-2 years  or as per Dr August Luz advises   Health Maintenance, Female Introduction Adopting a healthy lifestyle and getting preventive care can go a long way to promote health and wellness. Talk with your health care provider about what schedule of regular examinations is right for you. This is a good chance for you to check in with your provider about disease prevention and staying healthy. In  between checkups, there are plenty of things you can do on your own. Experts have done a lot of research about which lifestyle changes and preventive  measures are most likely to keep you healthy. Ask your health care provider for more information. Weight and diet Eat a healthy diet  Be sure to include plenty of vegetables, fruits, low-fat dairy products, and lean protein.  Do not eat a lot of foods high in solid fats, added sugars, or salt.  Get regular exercise. This is one of the most important things you can do for your health.  Most adults should exercise for at least 150 minutes each week. The exercise should increase your heart rate and make you sweat (moderate-intensity exercise).  Most adults should also do strengthening exercises at least twice a week. This is in addition to the moderate-intensity exercise. Maintain a healthy weight  Body mass index (BMI) is a measurement that can be used to identify possible weight problems. It estimates body fat based on height and weight. Your health care provider can help determine your BMI and help you achieve or maintain a healthy weight.  For females 45 years of age and older:  A BMI below 18.5 is considered underweight.  A BMI of 18.5 to 24.9 is normal.  A BMI of 25 to 29.9 is considered overweight.  A BMI of 30 and above is considered obese. Watch levels of cholesterol and blood lipids  You should start having your blood tested for lipids and cholesterol at 72 years of age, then have this test every 5 years.  You may need to have your cholesterol levels checked more often if:  Your lipid or cholesterol levels are high.  You are older than 72 years of age.  You are at high risk for heart disease. Cancer screening Lung Cancer  Lung cancer screening is recommended for adults 72-71 years old who are at high risk for lung cancer because of a history of smoking.  A yearly low-dose CT scan of the lungs is recommended for people  who:  Currently smoke.  Have quit within the past 15 years.  Have at least a 30-pack-year history of smoking. A pack year is smoking an average of one pack of cigarettes a day for 1 year.  Yearly screening should continue until it has been 15 years since you quit.  Yearly screening should stop if you develop a health problem that would prevent you from having lung cancer treatment. Breast Cancer  Practice breast self-awareness. This means understanding how your breasts normally appear and feel.  It also means doing regular breast self-exams. Let your health care provider know about any changes, no matter how small.  If you are in your 20s or 30s, you should have a clinical breast exam (CBE) by a health care provider every 1-3 years as part of a regular health exam.  If you are 73 or older, have a CBE every year. Also consider having a breast X-ray (mammogram) every year.  If you have a family history of breast cancer, talk to your health care provider about genetic screening.  If you are at high risk for breast cancer, talk to your health care provider about having an MRI and a mammogram every year.  Breast cancer gene (BRCA) assessment is recommended for women who have family members with BRCA-related cancers. BRCA-related cancers include:  Breast.  Ovarian.  Tubal.  Peritoneal cancers.  Results of the assessment will determine the need for genetic counseling and BRCA1 and BRCA2 testing. Cervical Cancer  Your health care provider may recommend that you be screened regularly for cancer of the pelvic organs (ovaries, uterus, and  vagina). This screening involves a pelvic examination, including checking for microscopic changes to the surface of your cervix (Pap test). You may be encouraged to have this screening done every 3 years, beginning at age 19.  For women ages 60-65, health care providers may recommend pelvic exams and Pap testing every 3 years, or they may recommend the  Pap and pelvic exam, combined with testing for human papilloma virus (HPV), every 5 years. Some types of HPV increase your risk of cervical cancer. Testing for HPV may also be done on women of any age with unclear Pap test results.  Other health care providers may not recommend any screening for nonpregnant women who are considered low risk for pelvic cancer and who do not have symptoms. Ask your health care provider if a screening pelvic exam is right for you.  If you have had past treatment for cervical cancer or a condition that could lead to cancer, you need Pap tests and screening for cancer for at least 20 years after your treatment. If Pap tests have been discontinued, your risk factors (such as having a new sexual partner) need to be reassessed to determine if screening should resume. Some women have medical problems that increase the chance of getting cervical cancer. In these cases, your health care provider may recommend more frequent screening and Pap tests. Colorectal Cancer  This type of cancer can be detected and often prevented.  Routine colorectal cancer screening usually begins at 72 years of age and continues through 72 years of age.  Your health care provider may recommend screening at an earlier age if you have risk factors for colon cancer.  Your health care provider may also recommend using home test kits to check for hidden blood in the stool.  A small camera at the end of a tube can be used to examine your colon directly (sigmoidoscopy or colonoscopy). This is done to check for the earliest forms of colorectal cancer.  Routine screening usually begins at age 81.  Direct examination of the colon should be repeated every 5-10 years through 72 years of age. However, you may need to be screened more often if early forms of precancerous polyps or small growths are found. Skin Cancer  Check your skin from head to toe regularly.  Tell your health care provider about any new  moles or changes in moles, especially if there is a change in a mole's shape or color.  Also tell your health care provider if you have a mole that is larger than the size of a pencil eraser.  Always use sunscreen. Apply sunscreen liberally and repeatedly throughout the day.  Protect yourself by wearing long sleeves, pants, a wide-brimmed hat, and sunglasses whenever you are outside. Heart disease, diabetes, and high blood pressure  High blood pressure causes heart disease and increases the risk of stroke. High blood pressure is more likely to develop in:  People who have blood pressure in the high end of the normal range (130-139/85-89 mm Hg).  People who are overweight or obese.  People who are African American.  If you are 24-17 years of age, have your blood pressure checked every 3-5 years. If you are 15 years of age or older, have your blood pressure checked every year. You should have your blood pressure measured twice-once when you are at a hospital or clinic, and once when you are not at a hospital or clinic. Record the average of the two measurements. To check your blood pressure  when you are not at a hospital or clinic, you can use:  An automated blood pressure machine at a pharmacy.  A home blood pressure monitor.  If you are between 46 years and 26 years old, ask your health care provider if you should take aspirin to prevent strokes.  Have regular diabetes screenings. This involves taking a blood sample to check your fasting blood sugar level.  If you are at a normal weight and have a low risk for diabetes, have this test once every three years after 72 years of age.  If you are overweight and have a high risk for diabetes, consider being tested at a younger age or more often. Preventing infection Hepatitis B  If you have a higher risk for hepatitis B, you should be screened for this virus. You are considered at high risk for hepatitis B if:  You were born in a country  where hepatitis B is common. Ask your health care provider which countries are considered high risk.  Your parents were born in a high-risk country, and you have not been immunized against hepatitis B (hepatitis B vaccine).  You have HIV or AIDS.  You use needles to inject street drugs.  You live with someone who has hepatitis B.  You have had sex with someone who has hepatitis B.  You get hemodialysis treatment.  You take certain medicines for conditions, including cancer, organ transplantation, and autoimmune conditions. Hepatitis C  Blood testing is recommended for:  Everyone born from [birth year] through 1966.  Anyone with known risk factors for hepatitis C. Sexually transmitted infections (STIs)  You should be screened for sexually transmitted infections (STIs) including gonorrhea and chlamydia if:  You are sexually active and are younger than 72 years of age.  You are older than 72 years of age and your health care provider tells you that you are at risk for this type of infection.  Your sexual activity has changed since you were last screened and you are at an increased risk for chlamydia or gonorrhea. Ask your health care provider if you are at risk.  If you do not have HIV, but are at risk, it may be recommended that you take a prescription medicine daily to prevent HIV infection. This is called pre-exposure prophylaxis (PrEP). You are considered at risk if:  You are sexually active and do not regularly use condoms or know the HIV status of your partner(s).  You take drugs by injection.  You are sexually active with a partner who has HIV. Talk with your health care provider about whether you are at high risk of being infected with HIV. If you choose to begin PrEP, you should first be tested for HIV. You should then be tested every 3 months for as long as you are taking PrEP. Pregnancy  If you are premenopausal and you may become pregnant, ask your health care provider  about preconception counseling.  If you may become pregnant, take 400 to 800 micrograms (mcg) of folic acid every day.  If you want to prevent pregnancy, talk to your health care provider about birth control (contraception). Osteoporosis and menopause  Osteoporosis is a disease in which the bones lose minerals and strength with aging. This can result in serious bone fractures. Your risk for osteoporosis can be identified using a bone density scan.  If you are 64 years of age or older, or if you are at risk for osteoporosis and fractures, ask your health care provider if you  should be screened.  Ask your health care provider whether you should take a calcium or vitamin D supplement to lower your risk for osteoporosis.  Menopause may have certain physical symptoms and risks.  Hormone replacement therapy may reduce some of these symptoms and risks. Talk to your health care provider about whether hormone replacement therapy is right for you. Follow these instructions at home:  Schedule regular health, dental, and eye exams.  Stay current with your immunizations.  Do not use any tobacco products including cigarettes, chewing tobacco, or electronic cigarettes.  If you are pregnant, do not drink alcohol.  If you are breastfeeding, limit how much and how often you drink alcohol.  Limit alcohol intake to no more than 1 drink per day for nonpregnant women. One drink equals 12 ounces of beer, 5 ounces of wine, or 1 ounces of hard liquor.  Do not use street drugs.  Do not share needles.  Ask your health care provider for help if you need support or information about quitting drugs.  Tell your health care provider if you often feel depressed.  Tell your health care provider if you have ever been abused or do not feel safe at home. This information is not intended to replace advice given to you by your health care provider. Make sure you discuss any questions you have with your health care  provider. Document Released: 08/16/2010 Document Revised: 07/09/2015 Document Reviewed: 11/04/2014  2017 [employer]vier    Mariann Laster K. Xzayvier Fagin M.D.

## 2016-03-30 ENCOUNTER — Ambulatory Visit (INDEPENDENT_AMBULATORY_CARE_PROVIDER_SITE_OTHER): Payer: Medicare Other | Admitting: Internal Medicine

## 2016-03-30 ENCOUNTER — Encounter: Payer: Self-pay | Admitting: Internal Medicine

## 2016-03-30 VITALS — BP 124/74 | Temp 97.5°F | Ht 60.0 in | Wt 131.0 lb

## 2016-03-30 DIAGNOSIS — I10 Essential (primary) hypertension: Secondary | ICD-10-CM | POA: Diagnosis not present

## 2016-03-30 DIAGNOSIS — M1990 Unspecified osteoarthritis, unspecified site: Secondary | ICD-10-CM | POA: Diagnosis not present

## 2016-03-30 DIAGNOSIS — Z0000 Encounter for general adult medical examination without abnormal findings: Secondary | ICD-10-CM

## 2016-03-30 DIAGNOSIS — Z79899 Other long term (current) drug therapy: Secondary | ICD-10-CM

## 2016-03-30 DIAGNOSIS — Z791 Long term (current) use of non-steroidal anti-inflammatories (NSAID): Secondary | ICD-10-CM

## 2016-03-30 NOTE — Patient Instructions (Addendum)
Glad you are doing well   If having blood in stool need fu  Gi here etc.   Due for  Blood monitoring  = chemistry ( BMP)   And if ok   Dr D can do this if needed  Well check in a year  Unless problem or  Lab fu needed   Fu dexa in   1-2 years  or as per Dr August Luz advises   Health Maintenance, Female Introduction Adopting a healthy lifestyle and getting preventive care can go a long way to promote health and wellness. Talk with your health care provider about what schedule of regular examinations is right for you. This is a good chance for you to check in with your provider about disease prevention and staying healthy. In between checkups, there are plenty of things you can do on your own. Experts have done a lot of research about which lifestyle changes and preventive measures are most likely to keep you healthy. Ask your health care provider for more information. Weight and diet Eat a healthy diet  Be sure to include plenty of vegetables, fruits, low-fat dairy products, and lean protein.  Do not eat a lot of foods high in solid fats, added sugars, or salt.  Get regular exercise. This is one of the most important things you can do for your health.  Most adults should exercise for at least 150 minutes each week. The exercise should increase your heart rate and make you sweat (moderate-intensity exercise).  Most adults should also do strengthening exercises at least twice a week. This is in addition to the moderate-intensity exercise. Maintain a healthy weight  Body mass index (BMI) is a measurement that can be used to identify possible weight problems. It estimates body fat based on height and weight. Your health care provider can help determine your BMI and help you achieve or maintain a healthy weight.  For females 56 years of age and older:  A BMI below 18.5 is considered underweight.  A BMI of 18.5 to 24.9 is normal.  A BMI of 25 to 29.9 is considered overweight.  A BMI  of 30 and above is considered obese. Watch levels of cholesterol and blood lipids  You should start having your blood tested for lipids and cholesterol at 72 years of age, then have this test every 5 years.  You may need to have your cholesterol levels checked more often if:  Your lipid or cholesterol levels are high.  You are older than 72 years of age.  You are at high risk for heart disease. Cancer screening Lung Cancer  Lung cancer screening is recommended for adults 23-17 years old who are at high risk for lung cancer because of a history of smoking.  A yearly low-dose CT scan of the lungs is recommended for people who:  Currently smoke.  Have quit within the past 15 years.  Have at least a 30-pack-year history of smoking. A pack year is smoking an average of one pack of cigarettes a day for 1 year.  Yearly screening should continue until it has been 15 years since you quit.  Yearly screening should stop if you develop a health problem that would prevent you from having lung cancer treatment. Breast Cancer  Practice breast self-awareness. This means understanding how your breasts normally appear and feel.  It also means doing regular breast self-exams. Let your health care provider know about any changes, no matter how small.  If you are in your  16s or 28s, you should have a clinical breast exam (CBE) by a health care provider every 1-3 years as part of a regular health exam.  If you are 81 or older, have a CBE every year. Also consider having a breast X-ray (mammogram) every year.  If you have a family history of breast cancer, talk to your health care provider about genetic screening.  If you are at high risk for breast cancer, talk to your health care provider about having an MRI and a mammogram every year.  Breast cancer gene (BRCA) assessment is recommended for women who have family members with BRCA-related cancers. BRCA-related cancers  include:  Breast.  Ovarian.  Tubal.  Peritoneal cancers.  Results of the assessment will determine the need for genetic counseling and BRCA1 and BRCA2 testing. Cervical Cancer  Your health care provider may recommend that you be screened regularly for cancer of the pelvic organs (ovaries, uterus, and vagina). This screening involves a pelvic examination, including checking for microscopic changes to the surface of your cervix (Pap test). You may be encouraged to have this screening done every 3 years, beginning at age 29.  For women ages 53-65, health care providers may recommend pelvic exams and Pap testing every 3 years, or they may recommend the Pap and pelvic exam, combined with testing for human papilloma virus (HPV), every 5 years. Some types of HPV increase your risk of cervical cancer. Testing for HPV may also be done on women of any age with unclear Pap test results.  Other health care providers may not recommend any screening for nonpregnant women who are considered low risk for pelvic cancer and who do not have symptoms. Ask your health care provider if a screening pelvic exam is right for you.  If you have had past treatment for cervical cancer or a condition that could lead to cancer, you need Pap tests and screening for cancer for at least 20 years after your treatment. If Pap tests have been discontinued, your risk factors (such as having a new sexual partner) need to be reassessed to determine if screening should resume. Some women have medical problems that increase the chance of getting cervical cancer. In these cases, your health care provider may recommend more frequent screening and Pap tests. Colorectal Cancer  This type of cancer can be detected and often prevented.  Routine colorectal cancer screening usually begins at 72 years of age and continues through 72 years of age.  Your health care provider may recommend screening at an earlier age if you have risk factors  for colon cancer.  Your health care provider may also recommend using home test kits to check for hidden blood in the stool.  A small camera at the end of a tube can be used to examine your colon directly (sigmoidoscopy or colonoscopy). This is done to check for the earliest forms of colorectal cancer.  Routine screening usually begins at age 26.  Direct examination of the colon should be repeated every 5-10 years through 72 years of age. However, you may need to be screened more often if early forms of precancerous polyps or small growths are found. Skin Cancer  Check your skin from head to toe regularly.  Tell your health care provider about any new moles or changes in moles, especially if there is a change in a mole's shape or color.  Also tell your health care provider if you have a mole that is larger than the size of a pencil  eraser.  Always use sunscreen. Apply sunscreen liberally and repeatedly throughout the day.  Protect yourself by wearing long sleeves, pants, a wide-brimmed hat, and sunglasses whenever you are outside. Heart disease, diabetes, and high blood pressure  High blood pressure causes heart disease and increases the risk of stroke. High blood pressure is more likely to develop in:  People who have blood pressure in the high end of the normal range (130-139/85-89 mm Hg).  People who are overweight or obese.  People who are African American.  If you are 54-61 years of age, have your blood pressure checked every 3-5 years. If you are 80 years of age or older, have your blood pressure checked every year. You should have your blood pressure measured twice-once when you are at a hospital or clinic, and once when you are not at a hospital or clinic. Record the average of the two measurements. To check your blood pressure when you are not at a hospital or clinic, you can use:  An automated blood pressure machine at a pharmacy.  A home blood pressure monitor.  If you  are between 35 years and 82 years old, ask your health care provider if you should take aspirin to prevent strokes.  Have regular diabetes screenings. This involves taking a blood sample to check your fasting blood sugar level.  If you are at a normal weight and have a low risk for diabetes, have this test once every three years after 72 years of age.  If you are overweight and have a high risk for diabetes, consider being tested at a younger age or more often. Preventing infection Hepatitis B  If you have a higher risk for hepatitis B, you should be screened for this virus. You are considered at high risk for hepatitis B if:  You were born in a country where hepatitis B is common. Ask your health care provider which countries are considered high risk.  Your parents were born in a high-risk country, and you have not been immunized against hepatitis B (hepatitis B vaccine).  You have HIV or AIDS.  You use needles to inject street drugs.  You live with someone who has hepatitis B.  You have had sex with someone who has hepatitis B.  You get hemodialysis treatment.  You take certain medicines for conditions, including cancer, organ transplantation, and autoimmune conditions. Hepatitis C  Blood testing is recommended for:  Everyone born from [birth year] through 1966.  Anyone with known risk factors for hepatitis C. Sexually transmitted infections (STIs)  You should be screened for sexually transmitted infections (STIs) including gonorrhea and chlamydia if:  You are sexually active and are younger than 72 years of age.  You are older than 72 years of age and your health care provider tells you that you are at risk for this type of infection.  Your sexual activity has changed since you were last screened and you are at an increased risk for chlamydia or gonorrhea. Ask your health care provider if you are at risk.  If you do not have HIV, but are at risk, it may be recommended that you  take a prescription medicine daily to prevent HIV infection. This is called pre-exposure prophylaxis (PrEP). You are considered at risk if:  You are sexually active and do not regularly use condoms or know the HIV status of your partner(s).  You take drugs by injection.  You are sexually active with a partner who has HIV. Talk with your health  care provider about whether you are at high risk of being infected with HIV. If you choose to begin PrEP, you should first be tested for HIV. You should then be tested every 3 months for as long as you are taking PrEP. Pregnancy  If you are premenopausal and you may become pregnant, ask your health care provider about preconception counseling.  If you may become pregnant, take 400 to 800 micrograms (mcg) of folic acid every day.  If you want to prevent pregnancy, talk to your health care provider about birth control (contraception). Osteoporosis and menopause  Osteoporosis is a disease in which the bones lose minerals and strength with aging. This can result in serious bone fractures. Your risk for osteoporosis can be identified using a bone density scan.  If you are 36 years of age or older, or if you are at risk for osteoporosis and fractures, ask your health care provider if you should be screened.  Ask your health care provider whether you should take a calcium or vitamin D supplement to lower your risk for osteoporosis.  Menopause may have certain physical symptoms and risks.  Hormone replacement therapy may reduce some of these symptoms and risks. Talk to your health care provider about whether hormone replacement therapy is right for you. Follow these instructions at home:  Schedule regular health, dental, and eye exams.  Stay current with your immunizations.  Do not use any tobacco products including cigarettes, chewing tobacco, or electronic cigarettes.  If you are pregnant, do not drink alcohol.  If you are breastfeeding, limit  how much and how often you drink alcohol.  Limit alcohol intake to no more than 1 drink per day for nonpregnant women. One drink equals 12 ounces of beer, 5 ounces of wine, or 1 ounces of hard liquor.  Do not use street drugs.  Do not share needles.  Ask your health care provider for help if you need support or information about quitting drugs.  Tell your health care provider if you often feel depressed.  Tell your health care provider if you have ever been abused or do not feel safe at home. This information is not intended to replace advice given to you by your health care provider. Make sure you discuss any questions you have with your health care provider. Document Released: 08/16/2010 Document Revised: 07/09/2015 Document Reviewed: 11/04/2014  2017 Elsevier

## 2016-03-31 ENCOUNTER — Other Ambulatory Visit (INDEPENDENT_AMBULATORY_CARE_PROVIDER_SITE_OTHER): Payer: Medicare Other

## 2016-03-31 DIAGNOSIS — Z79899 Other long term (current) drug therapy: Secondary | ICD-10-CM | POA: Diagnosis not present

## 2016-03-31 DIAGNOSIS — I10 Essential (primary) hypertension: Secondary | ICD-10-CM | POA: Diagnosis not present

## 2016-03-31 DIAGNOSIS — Z0000 Encounter for general adult medical examination without abnormal findings: Secondary | ICD-10-CM

## 2016-03-31 DIAGNOSIS — M1990 Unspecified osteoarthritis, unspecified site: Secondary | ICD-10-CM

## 2016-03-31 DIAGNOSIS — Z791 Long term (current) use of non-steroidal anti-inflammatories (NSAID): Secondary | ICD-10-CM | POA: Diagnosis not present

## 2016-03-31 LAB — BASIC METABOLIC PANEL
BUN: 30 mg/dL — ABNORMAL HIGH (ref 6–23)
CALCIUM: 9.5 mg/dL (ref 8.4–10.5)
CHLORIDE: 102 meq/L (ref 96–112)
CO2: 30 mEq/L (ref 19–32)
Creatinine, Ser: 0.94 mg/dL (ref 0.40–1.20)
GFR: 62.29 mL/min (ref >60.00)
Glucose, Bld: 83 mg/dL (ref 70–99)
Potassium: 4.7 mEq/L (ref 3.5–5.1)
Sodium: 137 mEq/L (ref 135–145)

## 2016-03-31 LAB — CBC WITH DIFFERENTIAL/PLATELET
BASOS ABS: 0.1 10*3/uL (ref 0.0–0.1)
Basophils Relative: 1.4 % (ref 0.0–3.0)
Eosinophils Absolute: 0.2 10*3/uL (ref 0.0–0.7)
Eosinophils Relative: 4.5 % (ref 0.0–5.0)
HEMATOCRIT: 40.5 % (ref 36.0–46.0)
Hemoglobin: 13.6 g/dL (ref 12.0–15.0)
LYMPHS ABS: 1.2 10*3/uL (ref 0.7–4.0)
LYMPHS PCT: 24.6 % (ref 12.0–46.0)
MCHC: 33.5 g/dL (ref 30.0–36.0)
MCV: 93.9 fl (ref 78.0–100.0)
MONOS PCT: 8.8 % (ref 3.0–12.0)
Monocytes Absolute: 0.4 10*3/uL (ref 0.1–1.0)
NEUTROS PCT: 60.7 % (ref 43.0–77.0)
Neutro Abs: 2.9 10*3/uL (ref 1.4–7.7)
Platelets: 244 10*3/uL (ref 150.0–400.0)
RBC: 4.31 Mil/uL (ref 3.87–5.11)
RDW: 13.5 % (ref 11.5–15.5)
WBC: 4.8 10*3/uL (ref 4.0–10.5)

## 2016-03-31 LAB — LIPID PANEL
Cholesterol: 237 mg/dL — ABNORMAL HIGH (ref 0–200)
HDL: 80.1 mg/dL (ref >39.00)
LDL Cholesterol: 144 mg/dL — ABNORMAL HIGH (ref 0–99)
NONHDL: 156.98
TRIGLYCERIDES: 67 mg/dL (ref 0.0–149.0)
Total CHOL/HDL Ratio: 3
VLDL: 13.4 mg/dL (ref 0.0–40.0)

## 2016-03-31 LAB — HEPATIC FUNCTION PANEL
ALBUMIN: 4.2 g/dL (ref 3.5–5.2)
ALK PHOS: 89 U/L (ref 39–117)
ALT: 20 U/L (ref 0–35)
AST: 23 U/L (ref 0–37)
Bilirubin, Direct: 0.1 mg/dL (ref 0.0–0.3)
TOTAL PROTEIN: 6.5 g/dL (ref 6.0–8.3)
Total Bilirubin: 0.5 mg/dL (ref 0.2–1.2)

## 2016-03-31 LAB — TSH: TSH: 1.92 u[IU]/mL (ref 0.35–4.50)

## 2016-04-04 ENCOUNTER — Telehealth: Payer: Self-pay

## 2016-04-04 NOTE — Telephone Encounter (Signed)
-----   Message from Eliezer Lofts, [place] sent at 04/04/2016  8:29 AM EST ----- Tell patient #1: Lipid panel shows elevated cholesterol and elevated LDL. See PCP for this. Consistent with past labs  #2: CBC with differential,  TSH, hepatic panel normal  #3: BMP is within normal limits.

## 2016-04-04 NOTE — Telephone Encounter (Signed)
Talked with patient and advised her of message concerning her lab work per Mr. Carlyon Shadow.  Patient stated that she had spoken with her PCP concerning her Cholesterol.

## 2016-04-09 ENCOUNTER — Other Ambulatory Visit: Payer: Self-pay | Admitting: Rheumatology

## 2016-04-11 NOTE — Telephone Encounter (Signed)
ok 

## 2016-04-11 NOTE — Telephone Encounter (Signed)
Last Visit: 06/22/15 Next Visit: 04/28/16 Labs: 03/31/16 Elevated Lipid panel  Okay to refill Celebrex?

## 2016-04-28 ENCOUNTER — Ambulatory Visit: Payer: Self-pay | Admitting: Rheumatology

## 2016-05-19 ENCOUNTER — Ambulatory Visit: Payer: Self-pay | Admitting: Rheumatology

## 2016-05-31 DIAGNOSIS — Z96659 Presence of unspecified artificial knee joint: Secondary | ICD-10-CM | POA: Insufficient documentation

## 2016-05-31 NOTE — Progress Notes (Signed)
Office Visit Note  Patient: Briana Giles             Date of Birth: 1945-04-16           MRN: [id number]             PCP: Lorretta Harp, MD Referring: Madelin Headings, MD Visit Date: 06/14/2016 Occupation: @GUAROCC @    Subjective: Right hand parasthesias.   History of Present Illness: Briana Giles is a 71 y.o. female with history of osteoarthritis and disc disease. She states she's been having some paresthesias to the her right hand. She has had this issue before which was related to her C-spine. She denies any injury or lifting any heavy objects. She has some stiffness in her hands and feet but no swelling. She also continues to have some discomfort in her lower back. She does use Lidoderm patches at times which are helpful. She denies any joint swelling. Patient states she gets off-and-on increased pain in her lower back for which she's been getting tramadol when necessary basis from her doctor in [facility]. She uses this occasionally. She wants [place] to take over the prescription has become very inconvenient for her to go to [facility].  Activities of Daily Living:  Patient reports morning stiffness for 5 minutes.   Patient Reports nocturnal pain.  Difficulty dressing/grooming: Denies Difficulty climbing stairs: Reports Difficulty getting out of chair: Denies Difficulty using hands for taps, buttons, cutlery, and/or writing: Denies   Review of Systems  Constitutional: Negative for fatigue, night sweats, weight gain, weight loss and weakness.  HENT: Negative for mouth sores, trouble swallowing, trouble swallowing, mouth dryness and nose dryness.   Eyes: Positive for dryness. Negative for pain, redness and visual disturbance.  Respiratory: Negative for cough, shortness of breath and difficulty breathing.   Cardiovascular: Negative for chest pain, palpitations, hypertension, irregular heartbeat and swelling in legs/feet.  Gastrointestinal: Negative  for blood in stool, constipation and diarrhea.  Endocrine: Negative for increased urination.  Genitourinary: Negative for vaginal dryness.  Musculoskeletal: Positive for arthralgias, joint pain and morning stiffness. Negative for joint swelling, myalgias, muscle weakness, muscle tenderness and myalgias.  Skin: Negative for color change, rash, hair loss, skin tightness, ulcers and sensitivity to sunlight.  Allergic/Immunologic: Negative for susceptible to infections.  Neurological: Negative for dizziness, memory loss and night sweats.  Hematological: Negative for swollen glands.  Psychiatric/Behavioral: Negative for depressed mood and sleep disturbance. The patient is not nervous/anxious.     PMFS History:  Patient Active Problem List   Diagnosis Date Noted  . DJD (degenerative joint disease), cervical 06/08/2016  . DDD (degenerative disc disease), lumbar 06/08/2016  . Abnormal serum angiotensin-converting enzyme level 06/08/2016  . History of partial knee replacement 05/31/2016  . Allergic rhinitis   . Chronic prescription benzodiazepine use 01/06/2014  . Acute upper respiratory infections of unspecified site 10/05/2013  . Persistent cough for 3 weeks or longer 10/05/2013  . 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 Abdominal bruit 12/31/2012  . Arthritis, rheumatoid (HCC) 12/31/2012  . Encounter for Medicare annual wellness exam 12/31/2012  . Degenerative joint disease of spine multilevel 03/07/2012  . Spinal stenosis, lumbar 03/07/2012  . Hip pain 09/02/2011  . Sciatica of left side 08/14/2011  . Spinal stenosis of lumbar region 08/14/2011  . Encounter for long-term (current) use of other medications 06/25/2011  . High risk medication use 12/21/2010  . Need for lipid screening 12/21/2010  .  Foot pain, left 07/22/2010  . CHEST PAIN 02/18/2010  . Osteopenia 07/05/2009  . TREMOR, RIGHT HAND 01/26/2009  . BACK PAIN 09/30/2008  . NEOPLASM, SKIN,  UNCERTAIN BEHAVIOR 05/27/2008  . HYPERPOTASSEMIA 05/27/2008  . Essential hypertension 10/12/2007  . TRANSIENT GLOBAL AMNESIA 10/12/2007  . VARICOSE VEINS, LOWER EXTREMITIES 06/06/2007  . SYSTOLIC MURMUR 06/06/2007  . ABDOMINAL BRUIT 06/06/2007  . ALLERGIC RHINITIS 08/17/2006  . GERD 08/17/2006  . Osteoarthritis 08/17/2006  . Disturbance in sleep behavior 08/17/2006    Past Medical History:  Diagnosis Date  . Abdominal pain, other specified site   . Abnormal LFTs   . Allergic rhinitis   . Cervical back pain with evidence of disc disease   . GERD (gastroesophageal reflux disease)   . Heart murmur   . Hypertension   . Nerve damage    left arm  . Osteoarthritis   . Osteopenia   . Phlebitis   . Spinal stenosis   . Transient global amnesia     Family History  Problem Relation Age of Onset  . Hypertension Father   . Alcohol abuse Father   . Lung cancer Father   . Cancer Maternal Grandmother     sinus  . COPD Mother   . Osteoporosis Mother   . Heart disease Mother   . Hypertension Brother   . Prostate cancer Maternal Grandfather   . Valvular heart disease Brother   . Colon cancer Neg Hx   . Pancreatic cancer Neg Hx   . Stomach cancer Neg Hx    Past Surgical History:  Procedure Laterality Date  . BUNIONECTOMY     fall 2022 [facility]  . CHOLECYSTECTOMY    . FOOT SURGERY      x 2    . KNEE ARTHROSCOPY     right  . REPLACEMENT UNICONDYLAR JOINT KNEE     rt knee  . TONSILLECTOMY    . 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  [place] grand child    Does water exercise aerobics walking elliptical      Objective: Vital Signs: BP 118/62   Pulse 62   Resp 14   Wt 133 lb (60.3 kg)   BMI 25.97 kg/m    Physical Exam  Constitutional: She is oriented to person, place, and time. She appears well-developed and  well-nourished.  HENT:  Head: Normocephalic and atraumatic.  Eyes: Conjunctivae and EOM are normal.  Neck: Normal range of motion.  Cardiovascular: Normal rate, regular rhythm and intact distal pulses.   Murmur heard. Grade 2 systolic mur mur  Pulmonary/Chest: Effort normal and breath sounds normal.  Abdominal: Soft. Bowel sounds are normal.  Lymphadenopathy:    She has no cervical adenopathy.  Neurological: She is alert and oriented to person, place, and time.  Skin: Skin is warm and dry. Capillary refill takes less than 2 seconds.  Psychiatric: She has a normal mood and affect. Her behavior is normal.  Nursing note and vitals reviewed.    Musculoskeletal Exam: C-spine and thoracic spine and lumbar spine good range of motion. Shoulder joints elbow joints wrist joint MCPs PIPs DIPs with good range of motion. She has thickening of PIP/DIP joints bilaterally consistent with osteoarthritis. Hip joints knee joints ankles MTPs PIPs with good range of motion. She has  right partial knee replacement which is still have some warmth and limited extension. She tenderness on palpation over bilateral trochanteric bursa area consistent with trochanteric bursitis.  CDAI Exam: No CDAI exam completed.    Investigation: Findings:  09/26/2012 CBC was normal.  Comprehensive metabolic panel showed potassium of 5.5.  Sed rate was 7.  Rheumatoid factor was negative and CCP was negative.  Vitamin D was 64.   Every 2018 CBC normal, CMP normal 03/31/2016 CBC normal, CMP normal   Imaging: No results found.  Speciality Comments: No specialty comments available.    Procedures:  No procedures performed Allergies: Codeine phosphate   Assessment / Plan:     Visit Diagnoses: Primary osteoarthritis of both hands and she continues to have some stiffness in her hands. Joint protection and muscle strengthening discussed.  Primary osteoarthritis of both feet: She does have some discomfort in her  feet.  History of bunionectomy of left great toe: She has overcrowding of toes in her left foot.  Trochanteric bursitis of both hips -we will refer her to physical therapy Plan: Ambulatory referral to Physical Therapy  High risk medication use -patient wants to take over her tramadol prescription. Detailed counseling was provided. Informed consent was obtained in a narcotic agreement was signed. I will also check urine drug screen today. Plan: Pain Mgmt, Profile 5 w/Conf, I'll give her prescription for tramadol 50 mg 1 tablet by mouth twice a day total 60 tablets will be given. With refill 1 History of partial knee replacement right   DJD (degenerative joint disease), cervical: Chronic pain  DDD (degenerative disc disease), lumbar: Chronic pain  Osteopenia of multiple sites: According to patient's been followed by her PCP  Abnormal serum angiotensin-converting enzyme level - + ACE negative CXR   History of gastroesophageal reflux (GERD)  VARICOSE VEINS, LOWER EXTREMITIES  History of hypertension    Orders: Orders Placed This Encounter  Procedures  . Pain Mgmt, Profile 5 w/Conf, U  . Ambulatory referral to Physical Therapy   No orders of the defined types were placed in this encounter.   Face-to-face time spent with patient was 30 minutes. 50% of time was spent in counseling and coordination of care.  Follow-Up Instructions: Return in about 6 months (around 12/15/2016) for Osteoarthritis.   Pollyann Savoy, MD  Note - This record has been created using Animal nutritionist.  Chart creation errors have been sought, but may not always  have been located. Such creation errors do not reflect on  the standard of medical care.

## 2016-06-08 ENCOUNTER — Other Ambulatory Visit: Payer: Self-pay | Admitting: Internal Medicine

## 2016-06-08 DIAGNOSIS — M47812 Spondylosis without myelopathy or radiculopathy, cervical region: Secondary | ICD-10-CM | POA: Insufficient documentation

## 2016-06-08 DIAGNOSIS — R748 Abnormal levels of other serum enzymes: Secondary | ICD-10-CM | POA: Insufficient documentation

## 2016-06-08 DIAGNOSIS — M5136 Other intervertebral disc degeneration, lumbar region: Secondary | ICD-10-CM | POA: Insufficient documentation

## 2016-06-14 ENCOUNTER — Ambulatory Visit (INDEPENDENT_AMBULATORY_CARE_PROVIDER_SITE_OTHER): Payer: Medicare Other | Admitting: Rheumatology

## 2016-06-14 ENCOUNTER — Encounter: Payer: Self-pay | Admitting: Rheumatology

## 2016-06-14 VITALS — BP 118/62 | HR 62 | Resp 14 | Wt 133.0 lb

## 2016-06-14 DIAGNOSIS — M19072 Primary osteoarthritis, left ankle and foot: Secondary | ICD-10-CM

## 2016-06-14 DIAGNOSIS — M19042 Primary osteoarthritis, left hand: Secondary | ICD-10-CM

## 2016-06-14 DIAGNOSIS — Z8679 Personal history of other diseases of the circulatory system: Secondary | ICD-10-CM

## 2016-06-14 DIAGNOSIS — Z9889 Other specified postprocedural states: Secondary | ICD-10-CM | POA: Diagnosis not present

## 2016-06-14 DIAGNOSIS — M19041 Primary osteoarthritis, right hand: Secondary | ICD-10-CM

## 2016-06-14 DIAGNOSIS — M8589 Other specified disorders of bone density and structure, multiple sites: Secondary | ICD-10-CM

## 2016-06-14 DIAGNOSIS — Z79899 Other long term (current) drug therapy: Secondary | ICD-10-CM

## 2016-06-14 DIAGNOSIS — M5030 Other cervical disc degeneration, unspecified cervical region: Secondary | ICD-10-CM

## 2016-06-14 DIAGNOSIS — R748 Abnormal levels of other serum enzymes: Secondary | ICD-10-CM

## 2016-06-14 DIAGNOSIS — Z8719 Personal history of other diseases of the digestive system: Secondary | ICD-10-CM

## 2016-06-14 DIAGNOSIS — M7061 Trochanteric bursitis, right hip: Secondary | ICD-10-CM | POA: Diagnosis not present

## 2016-06-14 DIAGNOSIS — I8390 Asymptomatic varicose veins of unspecified lower extremity: Secondary | ICD-10-CM

## 2016-06-14 DIAGNOSIS — M5136 Other intervertebral disc degeneration, lumbar region: Secondary | ICD-10-CM

## 2016-06-14 DIAGNOSIS — M47812 Spondylosis without myelopathy or radiculopathy, cervical region: Secondary | ICD-10-CM

## 2016-06-14 DIAGNOSIS — Z96659 Presence of unspecified artificial knee joint: Secondary | ICD-10-CM

## 2016-06-14 DIAGNOSIS — M7062 Trochanteric bursitis, left hip: Secondary | ICD-10-CM

## 2016-06-14 DIAGNOSIS — M19071 Primary osteoarthritis, right ankle and foot: Secondary | ICD-10-CM | POA: Diagnosis not present

## 2016-06-14 MED ORDER — TRAMADOL HCL 50 MG PO TABS: ORAL_TABLET | ORAL | 1 refills | Status: DC

## 2016-06-17 LAB — PAIN MGMT, PROFILE 5 W/CONF, U
ALPHAHYDROXYMIDAZOLAM: NEGATIVE ng/mL (ref <50)
ALPHAHYDROXYTRIAZOLAM: NEGATIVE ng/mL (ref <50)
AMINOCLONAZEPAM: NEGATIVE ng/mL (ref <25)
Alphahydroxyalprazolam: NEGATIVE ng/mL (ref <25)
Amphetamines: NEGATIVE ng/mL (ref <500)
BARBITURATES: NEGATIVE ng/mL (ref <300)
BENZODIAZEPINES: NEGATIVE ng/mL (ref <100)
Cocaine Metabolite: NEGATIVE ng/mL (ref <150)
Creatinine: 105 mg/dL (ref >=20.0)
HYDROXYETHYLFLURAZEPAM: NEGATIVE ng/mL (ref <50)
Lorazepam: NEGATIVE ng/mL (ref <50)
METHADONE METABOLITE: NEGATIVE ng/mL (ref <100)
Marijuana Metabolite: NEGATIVE ng/mL (ref <20)
NORDIAZEPAM: NEGATIVE ng/mL (ref <50)
OPIATES: NEGATIVE ng/mL (ref <100)
OXAZEPAM: NEGATIVE ng/mL (ref <50)
OXIDANT: NEGATIVE ug/mL (ref <200)
OXYCODONE: NEGATIVE ng/mL (ref <100)
Temazepam: NEGATIVE ng/mL (ref <50)
pH: 5.91 (ref 4.5–9.0)

## 2016-06-17 NOTE — Progress Notes (Signed)
C/w

## 2016-06-21 ENCOUNTER — Telehealth: Payer: Self-pay | Admitting: Rheumatology

## 2016-06-21 DIAGNOSIS — M7061 Trochanteric bursitis, right hip: Secondary | ICD-10-CM

## 2016-06-21 DIAGNOSIS — M7062 Trochanteric bursitis, left hip: Principal | ICD-10-CM

## 2016-06-21 NOTE — Telephone Encounter (Signed)
Okay to refer patient to Ileana Roup?

## 2016-06-21 NOTE — Telephone Encounter (Signed)
Patient advised referral has been placed in the computer.

## 2016-06-21 NOTE — Telephone Encounter (Signed)
Patient would like a referral to see Ileana Roup at [facility] for PT. Patient was contacted today by another physical therapist, and would like to change the referral facility. Please advise patient.

## 2016-06-21 NOTE — Telephone Encounter (Signed)
ok 

## 2016-06-24 ENCOUNTER — Other Ambulatory Visit: Payer: Self-pay | Admitting: Rheumatology

## 2016-06-24 NOTE — Telephone Encounter (Signed)
ok 

## 2016-06-24 NOTE — Telephone Encounter (Signed)
Last visit: 06/14/16 Next Visit: 12/15/16 Labs: 03/31/16 WNL  Okay to refill Celebrex?

## 2016-07-19 ENCOUNTER — Other Ambulatory Visit: Payer: Self-pay | Admitting: Rheumatology

## 2016-07-19 NOTE — Telephone Encounter (Signed)
ok 

## 2016-07-19 NOTE — Telephone Encounter (Signed)
Last visit: 06/14/16 Next Visit: 12/15/16 Labs: 03/31/16 WNL  Okay to refill Celebrex?

## 2016-10-25 ENCOUNTER — Other Ambulatory Visit: Payer: Self-pay | Admitting: *Deleted

## 2016-10-25 ENCOUNTER — Other Ambulatory Visit: Payer: Self-pay | Admitting: Rheumatology

## 2016-10-25 ENCOUNTER — Other Ambulatory Visit: Payer: Self-pay | Admitting: Internal Medicine

## 2016-10-25 DIAGNOSIS — Z79899 Other long term (current) drug therapy: Secondary | ICD-10-CM

## 2016-10-25 DIAGNOSIS — Z1231 Encounter for screening mammogram for malignant neoplasm of breast: Secondary | ICD-10-CM

## 2016-10-25 NOTE — Telephone Encounter (Signed)
Last visit: 06/14/16 Next Visit: 12/15/16 Labs: 03/31/16 WNL  Patient will update labs today.

## 2016-10-25 NOTE — Telephone Encounter (Signed)
Patient came in for labs 10/25/16. Okay to refill per Dr. Estanislado Pandy

## 2016-10-26 LAB — CBC WITH DIFFERENTIAL/PLATELET
BASOS ABS: 77 {cells}/uL (ref 0–200)
Basophils Relative: 1.2 %
EOS PCT: 4.7 %
Eosinophils Absolute: 301 cells/uL (ref 15–500)
HEMATOCRIT: 40.2 % (ref 35.0–45.0)
Hemoglobin: 13.3 g/dL (ref 11.7–15.5)
LYMPHS ABS: 1510 {cells}/uL (ref 850–3900)
MCH: 30.8 pg (ref 27.0–33.0)
MCHC: 33.1 g/dL (ref 32.0–36.0)
MCV: 93.1 fL (ref 80.0–100.0)
MPV: 12 fL (ref 7.5–12.5)
Monocytes Relative: 9.5 %
NEUTROS PCT: 61 %
Neutro Abs: 3904 cells/uL (ref 1500–7800)
PLATELETS: 232 10*3/uL (ref 140–400)
RBC: 4.32 10*6/uL (ref 3.80–5.10)
RDW: 12.2 % (ref 11.0–15.0)
TOTAL LYMPHOCYTE: 23.6 %
WBC mixed population: 608 cells/uL (ref 200–950)
WBC: 6.4 10*3/uL (ref 3.8–10.8)

## 2016-10-26 LAB — COMPLETE METABOLIC PANEL WITH GFR
AG Ratio: 1.8 (calc) (ref 1.0–2.5)
ALBUMIN MSPROF: 4.4 g/dL (ref 3.6–5.1)
ALKALINE PHOSPHATASE (APISO): 93 U/L (ref 33–130)
ALT: 25 U/L (ref 6–29)
AST: 43 U/L — ABNORMAL HIGH (ref 10–35)
BILIRUBIN TOTAL: 0.5 mg/dL (ref 0.2–1.2)
BUN/Creatinine Ratio: 31 (calc) — ABNORMAL HIGH (ref 6–22)
BUN: 28 mg/dL — AB (ref 7–25)
CHLORIDE: 102 mmol/L (ref 98–110)
CO2: 29 mmol/L (ref 20–32)
Calcium: 9.6 mg/dL (ref 8.6–10.4)
Creat: 0.91 mg/dL (ref 0.60–0.93)
GFR, Est African American: 73 mL/min/{1.73_m2} (ref >=60)
GFR, Est Non African American: 63 mL/min/{1.73_m2} (ref >=60)
GLUCOSE: 89 mg/dL (ref 65–99)
Globulin: 2.4 g/dL (calc) (ref 1.9–3.7)
Potassium: 5.1 mmol/L (ref 3.5–5.3)
Sodium: 139 mmol/L (ref 135–146)
Total Protein: 6.8 g/dL (ref 6.1–8.1)

## 2016-10-26 NOTE — Progress Notes (Signed)
Avoid NSAIDS, Tylenol and alcohol.

## 2016-10-27 ENCOUNTER — Telehealth: Payer: Self-pay | Admitting: Rheumatology

## 2016-10-27 ENCOUNTER — Ambulatory Visit
Admission: RE | Admit: 2016-10-27 | Discharge: 2016-10-27 | Disposition: A | Discharge status: Home / Self Care | Payer: Medicare Other | Source: Ambulatory Visit | Attending: Internal Medicine | Admitting: Internal Medicine

## 2016-10-27 ENCOUNTER — Telehealth: Payer: Self-pay | Admitting: Radiology

## 2016-10-27 DIAGNOSIS — Z1231 Encounter for screening mammogram for malignant neoplasm of breast: Secondary | ICD-10-CM

## 2016-10-27 NOTE — Telephone Encounter (Signed)
Patient would like a call back in regards to labs results. She has a few more questions. Please call to inform.

## 2016-10-27 NOTE — Telephone Encounter (Signed)
Natural anti-inflammatories  You can purchase these at [employer], [employer] or online.  . Turmeric (capsules)  . Ginger (ginger root or capsules)  . Omega 3 (Fish, flax seeds, chia seeds, walnuts, almonds)  . Tart cherry (dried or extract)  Pt advised to try these instead

## 2016-10-27 NOTE — Telephone Encounter (Signed)
I do not advise NSAIDS . She may try natural anti-inflammatories.

## 2016-10-27 NOTE — Telephone Encounter (Signed)
Patient advised of her elevated lab liver functions. Patient advised to avoid NSAIDS, tylenol and alcohol. Patient wants to know for how long does she need to avoid NSAIDS and tylenol as she does a yoga class and exercise and the Celebrex helps with that. Patient wold like to know when you want her to repeat the labs. Please advise

## 2016-10-27 NOTE — Telephone Encounter (Signed)
Attempted to contact the patient and left message for patient to call the office.  

## 2016-11-04 ENCOUNTER — Encounter: Payer: Self-pay | Admitting: Internal Medicine

## 2016-12-04 NOTE — Progress Notes (Signed)
Office Visit Note  Patient: Briana Giles             Date of Birth: 16-Jan-1946           MRN: [id number]             PCP: Madelin Headings, MD Referring: Madelin Headings, MD Visit Date: 12/15/2016 Occupation: @GUAROCC @    Subjective:  Pain in hands and feet.   History of Present Illness: Briana Giles is a 71 y.o. female with history of osteoarthritis and disc disease. She states that she continues to have some pain and stiffness in her joints. Gradually she has discontinued Celebrex since taking oral natural anti-inflammatories. She still has to take tramadol on when necessary basis for increased pain. With certain activities at times she states her  pain level goes on the scale of 0-10 about 7-8 and with tramadol it comes down to 2 or 3.  Activities of Daily Living:  Patient reports morning stiffness for 0 minute.   Patient Reports nocturnal pain.  Difficulty dressing/grooming: Denies Difficulty climbing stairs: Reports Difficulty getting out of chair: Reports Difficulty using hands for taps, buttons, cutlery, and/or writing: Denies   Review of Systems  Constitutional: Negative for fatigue, night sweats, weight gain, weight loss and weakness.  HENT: Negative for mouth sores, trouble swallowing, trouble swallowing, mouth dryness and nose dryness.   Eyes: Positive for dryness. Negative for pain, redness and visual disturbance.  Respiratory: Negative for cough, shortness of breath and difficulty breathing.   Cardiovascular: Positive for hypertension. Negative for chest pain, palpitations, irregular heartbeat and swelling in legs/feet.  Gastrointestinal: Negative for blood in stool, constipation and diarrhea.  Endocrine: Negative for increased urination.  Genitourinary: Negative for vaginal dryness.  Musculoskeletal: Positive for arthralgias and joint pain. Negative for joint swelling, myalgias, muscle weakness, morning stiffness, muscle tenderness and myalgias.  Skin: Negative  for color change, rash, hair loss, skin tightness, ulcers and sensitivity to sunlight.  Allergic/Immunologic: Negative for susceptible to infections.  Neurological: Negative for dizziness, memory loss and night sweats.  Hematological: Negative for swollen glands.  Psychiatric/Behavioral: Positive for sleep disturbance. Negative for depressed mood. The patient is not nervous/anxious.     PMFS History:  Patient Active Problem List   Diagnosis Date Noted  . DJD (degenerative joint disease), cervical 06/08/2016  . DDD (degenerative disc disease), lumbar 06/08/2016  . Abnormal serum angiotensin-converting enzyme level 06/08/2016  . History of partial knee replacement 05/31/2016  . Allergic rhinitis   . Chronic prescription benzodiazepine use 01/06/2014  . Acute upper respiratory infections of unspecified site 10/05/2013  . Persistent cough for 3 weeks or longer 10/05/2013  . 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 Abdominal bruit 12/31/2012  . Arthritis, rheumatoid (HCC) 12/31/2012  . Encounter for Medicare annual wellness exam 12/31/2012  . Degenerative joint disease of spine multilevel 03/07/2012  . Spinal stenosis, lumbar 03/07/2012  . Hip pain 09/02/2011  . Sciatica of left side 08/14/2011  . Spinal stenosis of lumbar region 08/14/2011  . Encounter for long-term (current) use of other medications 06/25/2011  . High risk medication use 12/21/2010  . Need for lipid screening 12/21/2010  . Foot pain, left 07/22/2010  . CHEST PAIN 02/18/2010  . Osteopenia 07/05/2009  . TREMOR, RIGHT HAND 01/26/2009  . BACK PAIN 09/30/2008  . NEOPLASM, SKIN, UNCERTAIN BEHAVIOR 05/27/2008  . HYPERPOTASSEMIA 05/27/2008  . Essential hypertension 10/12/2007  . TRANSIENT GLOBAL AMNESIA 10/12/2007  . VARICOSE  VEINS, LOWER EXTREMITIES 06/06/2007  . SYSTOLIC MURMUR 06/06/2007  . ABDOMINAL BRUIT 06/06/2007  . ALLERGIC RHINITIS 08/17/2006  . GERD 08/17/2006  .  Osteoarthritis 08/17/2006  . Disturbance in sleep behavior 08/17/2006    Past Medical History:  Diagnosis Date  . Abdominal pain, other specified site   . Abnormal LFTs   . Allergic rhinitis   . Cervical back pain with evidence of disc disease   . GERD (gastroesophageal reflux disease)   . Heart murmur   . Hypertension   . Nerve damage    left arm  . Osteoarthritis   . Osteopenia   . Phlebitis   . Spinal stenosis   . Transient global amnesia     Family History  Problem Relation Age of Onset  . Hypertension Father   . Alcohol abuse Father   . Lung cancer Father   . Cancer Maternal Grandmother        sinus  . COPD Mother   . Osteoporosis Mother   . Heart disease Mother   . Hypertension Brother   . Prostate cancer Maternal Grandfather   . Valvular heart disease Brother   . Colon cancer Neg Hx   . Pancreatic cancer Neg Hx   . Stomach cancer Neg Hx    Past Surgical History:  Procedure Laterality Date  . BUNIONECTOMY     fall 2022 [facility]  . CHOLECYSTECTOMY    . FOOT SURGERY      x 2    . HAND SURGERY     right hand  . KNEE ARTHROSCOPY     right  . REPLACEMENT UNICONDYLAR JOINT KNEE     rt knee  . TONSILLECTOMY    . 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  [place] grand child    Does water exercise aerobics walking elliptical      Objective: Vital Signs: BP 116/74 (BP Location: Left Arm, Patient Position: Sitting, Cuff Size: Normal)   Pulse 79   Resp 14   Ht 5\' 1"  (1.549 m)   Wt 136 lb (61.7 kg)   BMI 25.70 kg/m    Physical Exam  Constitutional: She is oriented to person, place, and time. She appears well-developed and well-nourished.  HENT:  Head: Normocephalic and atraumatic.  Eyes: Conjunctivae and EOM are normal.  Neck: Normal range of motion.  Cardiovascular: Normal rate, regular rhythm and  intact distal pulses.   Murmur heard. Pulmonary/Chest: Effort normal and breath sounds normal.  Abdominal: Soft. Bowel sounds are normal.  Lymphadenopathy:    She has no cervical adenopathy.  Neurological: She is alert and oriented to person, place, and time.  Skin: Skin is warm and dry. Capillary refill takes less than 2 seconds.  Psychiatric: She has a normal mood and affect. Her behavior is normal.  Nursing note and vitals reviewed.    Musculoskeletal Exam: C-spine and thoracic lumbar spine limited range of motion with discomfort. Shoulder joints elbow joints wrist joint MCPs PIPs DIPs with good range of motion. She is some DIP thickening consistent with osteoarthritis. Her right total knee replacement is doing well. She is some osteoarthritic changes in her feet. No synovitis was noted.  CDAI Exam: No CDAI exam completed.    Investigation: No additional findings.UDS: 06/2016 Narc agreement: 06/2016 CBC Latest Ref  Rng & Units 10/25/2016 03/31/2016 03/18/2015  WBC 3.8 - 10.8 Thousand/uL 6.4 4.8 6.3  Hemoglobin 11.7 - 15.5 g/dL 16.1 09.6 04.5  Hematocrit 35.0 - 45.0 % 40.2 40.5 40.6  Platelets 140 - 400 Thousand/uL 232 244.0 224.0   CMP Latest Ref Rng & Units 10/25/2016 03/31/2016 09/15/2015  Glucose 65 - 99 mg/dL 89 83 86  BUN 7 - 25 mg/dL 40(J) 81(X) 91(Y)  Creatinine 0.60 - 0.93 mg/dL 7.82 9.56 2.13  Sodium 135 - 146 mmol/L 139 137 139  Potassium 3.5 - 5.3 mmol/L 5.1 4.7 4.8  Chloride 98 - 110 mmol/L 102 102 103  CO2 20 - 32 mmol/L 29 30 29   Calcium 8.6 - 10.4 mg/dL 9.6 9.5 9.8  Total Protein 6.1 - 8.1 g/dL 6.8 6.5 7.2  Total Bilirubin 0.2 - 1.2 mg/dL 0.5 0.5 0.5  Alkaline Phos 39 - 117 U/L - 89 93  AST 10 - 35 U/L 43(H) 23 21  ALT 6 - 29 U/L 25 20 19     Imaging: No results found.  Speciality Comments: No specialty comments available.    Procedures:  No procedures performed Allergies: Codeine phosphate   Assessment / Plan:     Visit Diagnoses: Primary osteoarthritis  of both hands: Joint protection and muscle strengthening was discussed.  Trochanteric bursitis of both hips - she has been going to physical therapy hand is doing much better.  History of partial knee replacement - right: Doing well  Primary osteoarthritis of both feet: Proper fitting shoes were discussed.  History of bunionectomy of left great toe  Other chronic pain - Tramadol 50 mg po bid prn. Her labs have been stable.  DDD (degenerative disc disease), cervical: Chronic pain  DDD (degenerative disc disease), lumbar: Chronic pain per her request prescription for Lidoderm patch was given.  Osteopenia of multiple sites - According to patient's been followed by her PCP - Plan: VITAMIN D 25 Hydroxy (Vit-D Deficiency, Fractures)  Elevated LFTs -patient has discontinued Celebrex. I will check her CMP again today. Plan: COMPLETE METABOLIC PANEL WITH GFR  Abnormal serum angiotensin-converting enzyme level - + ACE negative CXR. Patient has no clinical features of inflammatory disease autoimmune disease.  Her other medical problems are listed as follows:  History of gastroesophageal reflux (GERD)  History of hypertension  History of varicose veins - lower extremities    Orders: Orders Placed This Encounter  Procedures  . COMPLETE METABOLIC PANEL WITH GFR  . VITAMIN D 25 Hydroxy (Vit-D Deficiency, Fractures)   Meds ordered this encounter  Medications  . lidocaine (LIDODERM) 5 %    Sig: Place 2 patches onto the skin daily. Remove & Discard patch within 12 hours or as directed by MD    Dispense:  60 patch    Refill:  2    Face-to-face time spent with patient was 30 minutes.Greater than 50% of time was spent in counseling and coordination of care.  Follow-Up Instructions: Return in about 6 months (around 06/14/2017) for Osteoarthritis DDD.   Pollyann Savoy, MD  Note - This record has been created using Animal nutritionist.  Chart creation errors have been sought, but may not  always  have been located. Such creation errors do not reflect on  the standard of medical care.

## 2016-12-15 ENCOUNTER — Ambulatory Visit (INDEPENDENT_AMBULATORY_CARE_PROVIDER_SITE_OTHER): Payer: Medicare Other | Admitting: Rheumatology

## 2016-12-15 ENCOUNTER — Encounter: Payer: Self-pay | Admitting: Rheumatology

## 2016-12-15 ENCOUNTER — Telehealth: Payer: Self-pay

## 2016-12-15 VITALS — BP 116/74 | HR 79 | Resp 14 | Ht 61.0 in | Wt 136.0 lb

## 2016-12-15 DIAGNOSIS — Z96659 Presence of unspecified artificial knee joint: Secondary | ICD-10-CM

## 2016-12-15 DIAGNOSIS — M19042 Primary osteoarthritis, left hand: Secondary | ICD-10-CM

## 2016-12-15 DIAGNOSIS — R748 Abnormal levels of other serum enzymes: Secondary | ICD-10-CM | POA: Diagnosis not present

## 2016-12-15 DIAGNOSIS — M19041 Primary osteoarthritis, right hand: Secondary | ICD-10-CM

## 2016-12-15 DIAGNOSIS — M8589 Other specified disorders of bone density and structure, multiple sites: Secondary | ICD-10-CM

## 2016-12-15 DIAGNOSIS — Z9889 Other specified postprocedural states: Secondary | ICD-10-CM

## 2016-12-15 DIAGNOSIS — M7062 Trochanteric bursitis, left hip: Secondary | ICD-10-CM

## 2016-12-15 DIAGNOSIS — M19071 Primary osteoarthritis, right ankle and foot: Secondary | ICD-10-CM | POA: Diagnosis not present

## 2016-12-15 DIAGNOSIS — M19072 Primary osteoarthritis, left ankle and foot: Secondary | ICD-10-CM

## 2016-12-15 DIAGNOSIS — Z8719 Personal history of other diseases of the digestive system: Secondary | ICD-10-CM

## 2016-12-15 DIAGNOSIS — M5136 Other intervertebral disc degeneration, lumbar region: Secondary | ICD-10-CM

## 2016-12-15 DIAGNOSIS — M7061 Trochanteric bursitis, right hip: Secondary | ICD-10-CM

## 2016-12-15 DIAGNOSIS — G8929 Other chronic pain: Secondary | ICD-10-CM

## 2016-12-15 DIAGNOSIS — M5030 Other cervical disc degeneration, unspecified cervical region: Secondary | ICD-10-CM | POA: Diagnosis not present

## 2016-12-15 DIAGNOSIS — Z8679 Personal history of other diseases of the circulatory system: Secondary | ICD-10-CM | POA: Diagnosis not present

## 2016-12-15 DIAGNOSIS — R7989 Other specified abnormal findings of blood chemistry: Secondary | ICD-10-CM

## 2016-12-15 DIAGNOSIS — R945 Abnormal results of liver function studies: Secondary | ICD-10-CM

## 2016-12-15 MED ORDER — LIDOCAINE 5 % EX PTCH: 2.0000 | MEDICATED_PATCH | CUTANEOUS | 2 refills | Status: DC

## 2016-12-15 NOTE — Telephone Encounter (Signed)
Patient called concerning Rx going to the correct pharmacy.

## 2016-12-16 ENCOUNTER — Telehealth: Payer: Self-pay | Admitting: Rheumatology

## 2016-12-16 ENCOUNTER — Telehealth: Payer: Self-pay

## 2016-12-16 LAB — COMPLETE METABOLIC PANEL WITH GFR
AG Ratio: 1.6 (calc) (ref 1.0–2.5)
ALKALINE PHOSPHATASE (APISO): 100 U/L (ref 33–130)
ALT: 19 U/L (ref 6–29)
AST: 22 U/L (ref 10–35)
Albumin: 4.4 g/dL (ref 3.6–5.1)
BUN/Creatinine Ratio: 27 (calc) — ABNORMAL HIGH (ref 6–22)
BUN: 26 mg/dL — ABNORMAL HIGH (ref 7–25)
CO2: 28 mmol/L (ref 20–32)
CREATININE: 0.96 mg/dL — AB (ref 0.60–0.93)
Calcium: 10 mg/dL (ref 8.6–10.4)
Chloride: 102 mmol/L (ref 98–110)
GFR, Est African American: 68 mL/min/{1.73_m2} (ref >=60)
GFR, Est Non African American: 59 mL/min/{1.73_m2} — ABNORMAL LOW (ref >=60)
GLOBULIN: 2.7 g/dL (ref 1.9–3.7)
GLUCOSE: 83 mg/dL (ref 65–99)
Potassium: 5.1 mmol/L (ref 3.5–5.3)
SODIUM: 140 mmol/L (ref 135–146)
Total Bilirubin: 0.5 mg/dL (ref 0.2–1.2)
Total Protein: 7.1 g/dL (ref 6.1–8.1)

## 2016-12-16 LAB — VITAMIN D 25 HYDROXY (VIT D DEFICIENCY, FRACTURES): VIT D 25 HYDROXY: 42 ng/mL (ref 30–100)

## 2016-12-16 MED ORDER — TRAMADOL HCL 50 MG PO TABS: ORAL_TABLET | ORAL | 1 refills | Status: DC

## 2016-12-16 NOTE — Telephone Encounter (Signed)
A prior authorization for Lidoderm 5% patches has been submitted to patients insurance via cover my meds. Will update once we receive a response.   Cayleb Jarnigan, [place], CPhT 12:16 PM

## 2016-12-16 NOTE — Progress Notes (Signed)
Labs are stable.

## 2016-12-16 NOTE — Telephone Encounter (Signed)
Patient states [facility] did not receive rx for Tramadol yesterday. But, patient wants rx sent to [facility] on [address].

## 2016-12-16 NOTE — Telephone Encounter (Signed)
Prescription faxed to the pharmacy. Patient advised.

## 2016-12-16 NOTE — Telephone Encounter (Signed)
Last Visit: 12/15/16 Next Visit: 06/14/16 UDS: 06/14/16 Narc agreement: 08/01/16  Okay per Dr. Estanislado Pandy

## 2016-12-20 NOTE — Telephone Encounter (Signed)
Received a fax from [employer] regarding a prior authorization DENIAL for Lidoderm 5%. Medication is not approved for her diagnoses DDD.   Reference number:[id number] Phone number: [phone]  Will send document to scan center.  Called patient to update. Left message.  Hula Tasso, [place], CPhT 12:20 PM

## 2017-02-10 ENCOUNTER — Other Ambulatory Visit: Payer: Self-pay | Admitting: Internal Medicine

## 2017-03-28 ENCOUNTER — Other Ambulatory Visit: Payer: Medicare Other

## 2017-04-04 ENCOUNTER — Encounter: Payer: Medicare Other | Admitting: Internal Medicine

## 2017-04-30 NOTE — Progress Notes (Signed)
Chief Complaint  Patient presents with  . Annual Exam    knee injry x1week, has improved    HPI: Briana Giles 72 y.o. comes in today for Preventive Medicare exam/ wellness visit .Since last visit. 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   DJD noted  BP   ok   On meds  No se  Tramadol as needed walking long distance .   Going on hikin tour in [place] in fall  Health Maintenance  Topic Date Due  . MAMMOGRAM  10/28/2018  . COLONOSCOPY  12/29/2022  . TETANUS/TDAP  01/07/2024  . INFLUENZA VACCINE  Completed  . DEXA SCAN  Completed  . Hepatitis C Screening  Completed  . PNA vac Low Risk Adult  Completed   Health Maintenance Review LIFESTYLE:  Exercise:   Y yoga or silver sneakers  2.5 miles  Now off celebrex.   Tobacco/ETS:  no Alcohol:   1-2 per day .   Sugar beverages: tart cherry juice for arthritis  Sleep: 7 hours   Drug use: no HH: 2 cats  And husband    Hearing:  Ok   Vision:  No limitations at present . Last eye check UTD glasses .  Cataract surgery  Glasses   Safety:  Has smoke detector and wears seat belts.  . No excess sun exposure. Sees dentist regularly.  Falls:   Advance directive :  Reviewed  Has one.  Memory: Felt to be good  , no concern from her or her family.  Depression: No anhedonia unusual crying or depressive symptoms  Nutrition: Eats well balanced diet; adequate calcium and vitamin D. No swallowing chewing problems.  Injury: no major injuries in the last six months.  Other healthcare providers:  Reviewed today .Marland Kitchen   Preventive parameters: up-to-date  Reviewed   ADLS:   There are no problems or need for assistance  driving, feeding, obtaining food, dressing, toileting and bathing, managing money using phone. She is independent.    ROS:  GEN/ HEENT: No fever, significant weight changes sweats headaches vision problems hearing changes, CV/ PULM; No chest pain shortness of breath cough, syncope,edema   change in exercise tolerance. GI /GU: No adominal pain, vomiting, change in bowel habits. No blood in the stool. No significant GU symptoms. SKIN/HEME: ,no acute skin rashes suspicious lesions or bleeding. No lymphadenopathy, nodules, masses.  NEURO/ PSYCH:  No neurologic signs such as weakness numbness. No depression anxiety. IMM/ Allergy: No unusual infections.  Allergy .   REST of 12 system review negative except as per HPI   Past Medical History:  Diagnosis Date  . Abdominal pain, other specified site   . Abnormal LFTs   . Allergic rhinitis   . Cervical back pain with evidence of disc disease   . GERD (gastroesophageal reflux disease)   . Heart murmur    says all her life had echo in [place] year ago not to worry  . Hypertension   . Nerve damage    left arm  . Osteoarthritis   . Osteopenia   . Phlebitis   . Spinal stenosis   . Transient global amnesia    Twin brother with arthritis  Family History  Problem Relation Age of Onset  . Hypertension Father   . Alcohol abuse Father   . Lung cancer Father   . Cancer Maternal Grandmother        sinus  . COPD Mother   .  Osteoporosis Mother   . Heart disease Mother   . Hypertension Brother   . Prostate cancer Maternal Grandfather   . Valvular heart disease Brother   . Colon cancer Neg Hx   . Pancreatic cancer Neg Hx   . Stomach cancer Neg Hx     Social History   Socioeconomic History  . Marital status: Married    Spouse name: None  . Number of children: 2  . Years of education: None  . Highest education level: None  Social Needs  . Financial resource strain: None  . Food insecurity - worry: None  . Food insecurity - inability: None  . Transportation needs - medical: None  . Transportation needs - non-medical: None  Occupational History  . Occupation: retired Product manager: RETIRED  Tobacco Use  . Smoking status: Never Smoker  . Smokeless tobacco: Never Used  Substance and Sexual Activity  . Alcohol  use: Yes    Comment: occasional   . Drug use: No  . Sexual activity: None  Other Topics Concern  . 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  [place] grand child    Does water exercise aerobics walking elliptical     Outpatient Encounter Medications as of 05/02/2017  Medication Sig  . acetaminophen (TYLENOL) 500 MG tablet Take 1,000 mg by mouth every 6 (six) hours as needed for mild pain, moderate pain or headache.   . calcium carbonate (OS-CAL - DOSED IN MG OF ELEMENTAL CALCIUM) 1250 (500 CA) MG tablet Take 1,000 mg by mouth daily.  . celecoxib (CELEBREX) 100 MG capsule TAKE 1 CAPSULE(100 MG) BY MOUTH DAILY (Patient not taking: Reported on 05/02/2017)  . CHERRY SYRUP PO Take by mouth daily.  . cycloSPORINE (RESTASIS) 0.05 % ophthalmic emulsion Place 1 drop into the right eye 2 (two) times daily.  . fluticasone (FLONASE) 50 MCG/ACT nasal spray USE TWO SPRAYS IN EACH NOSTRIL ONCE DAILY. (Patient taking differently: USE TWO SPRAYS IN EACH NOSTRIL ONCE DAILY AS NEEDED FOR ALLERGIES)  . Ginger, Zingiber officinalis, (GINGER ROOT PO) Take by mouth daily.  Marland Kitchen lidocaine (LIDODERM) 5 % Place 1 patch onto the skin daily as needed (pain). Reported on 03/30/2015  . lidocaine (LIDODERM) 5 % Place 2 patches onto the skin daily. Remove & Discard patch within 12 hours or as directed by MD  . lisinopril (PRINIVIL,ZESTRIL) 20 MG tablet TAKE 1 TABLET BY MOUTH EVERY DAY  . Omega-3 Fatty Acids (OMEGA 3 500 PO) Take by mouth daily.  Marland Kitchen omeprazole (PRILOSEC) 20 MG capsule TAKE ONE CAPSULE BY MOUTH ONCE EVERY DAY  . polyethylene glycol powder (MIRALAX) powder Take 17 g by mouth once.  . traMADol (ULTRAM) 50 MG tablet Take 50 mg by mouth 2 (two) times daily as needed for moderate pain or severe pain.   . traMADol (ULTRAM) 50 MG tablet 1 tablet by mouth twice a day when necessary  . TURMERIC PO Take 1 tablet by mouth  daily.   Marland Kitchen aspirin 81 MG tablet Take 81 mg by mouth daily.    . celecoxib (CELEBREX) 100 MG capsule Take 1 capsule (100 mg total) by mouth daily. (Patient not taking: Reported on 12/15/2016)   No facility-administered encounter medications on file as of 05/02/2017.     EXAM:  BP 122/80 (BP Location: Right Arm, Patient Position: Sitting, Cuff Size: Normal)  Pulse 70   Temp (!) 97.4 F (36.3 C) (Oral)   Ht 5' 0.5" (1.537 m)   Wt 138 lb 6.4 oz (62.8 kg)   BMI 26.58 kg/m   Body mass index is 26.58 kg/m.  Physical Exam: Vital signs reviewed INO:MVEH is a well-developed well-nourished alert cooperative   who appears stated age in no acute distress.  HEENT: normocephalic atraumatic , Eyes: PERRL EOM's full, conjunctiva clear, Nares: paten,t no deformity discharge or tenderness., Ears: no deformity EAC's clear TMs with normal landmarks. Mouth: clear OP, no lesions, edema.  Moist mucous membranes. Dentition in adequate repair. NECK: supple without masses, thyromegaly or bruits. CHEST/PULM:  Clear to auscultation and percussion breath sounds equal no wheeze , rales or rhonchi. No chest wall deformities or tenderness.Breast: normal by inspection . No dimpling, discharge, masses, tenderness or discharge . CV: PMI is nondisplaced, S1 S2 no gallops, rubs.2/6 sem usb l and r no radiation   Peripheral pulses are full without delay.No JVD .  ABDOMEN: Bowel sounds normal nontender  No guard or rebound, no hepato splenomegal no CVA tenderness.   Extremtities:  No clubbing cyanosis or edema, no acute joint swelling or redness no focal atrophy NEURO:  Oriented x3, cranial nerves 3-12 appear to be intact, no obvious focal weakness,gait within normal limits no abnormal reflexes or asymmetrical SKIN: No acute rashes normal turgor, color, no bruising or petechiae. PSYCH: Oriented, good eye contact, no obvious depression anxiety, cognition and judgment appear normal. LN: no cervical axillary inguinal  adenopathy No noted deficits in memory, attention, and speech.   Lab Results  Component Value Date   WBC 6.4 10/25/2016   HGB 13.3 10/25/2016   HCT 40.2 10/25/2016   PLT 232 10/25/2016   GLUCOSE 83 12/15/2016   CHOL 237 (H) 03/31/2016   TRIG 67.0 03/31/2016   HDL 80.10 03/31/2016   LDLDIRECT 127.4 12/27/2011   LDLCALC 144 (H) 03/31/2016   ALT 19 12/15/2016   AST 22 12/15/2016   NA 140 12/15/2016   K 5.1 12/15/2016   CL 102 12/15/2016   CREATININE 0.96 (H) 12/15/2016   BUN 26 (H) 12/15/2016   CO2 28 12/15/2016   TSH 1.92 03/31/2016    ASSESSMENT AND PLAN:  Discussed the following assessment and plan:  Visit for preventive health examination  Medication management - Plan: Basic metabolic panel, Lipid panel, TSH, Hepatic function panel  Essential hypertension - Plan: Basic metabolic panel, Lipid panel, TSH, Hepatic function panel  Osteoarthritis, unspecified osteoarthritis type, unspecified site - Plan: Basic metabolic panel, Lipid panel, TSH, Hepatic function panel  Hyperlipidemia, unspecified hyperlipidemia type - Plan: Basic metabolic panel, Lipid panel, TSH, Hepatic function panel  Heart murmur - see text  follow no sx  consider echo  last one  year ago Counseled.   Consider  Echo is  Sx or progression   Can get shingrix vaccine  Discussed at pharamcy Lab today non fasting  Limit  etoh to reduce health risk . Patient Care Team: Monicka Cyran, Standley Brooking, MD as PCP - General Regal, Tamala Fothergill, DPM (Podiatry) Bo Merino, MD (Rheumatology) Milus Banister, MD (Gastroenterology) Jari Pigg, MD as Attending Physician (Dermatology) Laurance Flatten, MD as Referring Physician (Orthopedic Surgery)  Patient Instructions  Continue lifestyle intervention healthy eating and exercise . Will notify you  of labs when available.  Consider Getting an echo test to evaluated murmur .   But can follow  Exam   Since you are doing well.    Can make  wellness visit with Wynetta Fines  our health coach     Health Maintenance, Female Adopting a healthy lifestyle and getting preventive care can go a long way to promote health and wellness. Talk with your health care provider about what schedule of regular examinations is right for you. This is a good chance for you to check in with your provider about disease prevention and staying healthy. In between checkups, there are plenty of things you can do on your own. Experts have done a lot of research about which lifestyle changes and preventive measures are most likely to keep you healthy. Ask your health care provider for more information. Weight and diet Eat a healthy diet  Be sure to include plenty of vegetables, fruits, low-fat dairy products, and lean protein.  Do not eat a lot of foods high in solid fats, added sugars, or salt.  Get regular exercise. This is one of the most important things you can do for your health. ? Most adults should exercise for at least 150 minutes each week. The exercise should increase your heart rate and make you sweat (moderate-intensity exercise). ? Most adults should also do strengthening exercises at least twice a week. This is in addition to the moderate-intensity exercise.  Maintain a healthy weight  Body mass index (BMI) is a measurement that can be used to identify possible weight problems. It estimates body fat based on height and weight. Your health care provider can help determine your BMI and help you achieve or maintain a healthy weight.  For females 20 years of age and older: ? A BMI below 18.5 is considered underweight. ? A BMI of 18.5 to 24.9 is normal. ? A BMI of 25 to 29.9 is considered overweight. ? A BMI of 30 and above is considered obese.  Watch levels of cholesterol and blood lipids  You should start having your blood tested for lipids and cholesterol at 73 years of age, then have this test every 5 years.  You may need to have your cholesterol levels checked more  often if: ? Your lipid or cholesterol levels are high. ? You are older than 73 years of age. ? You are at high risk for heart disease.  Cancer screening Lung Cancer  Lung cancer screening is recommended for adults 69-60 years old who are at high risk for lung cancer because of a history of smoking.  A yearly low-dose CT scan of the lungs is recommended for people who: ? Currently smoke. ? Have quit within the past 15 years. ? Have at least a 30-pack-year history of smoking. A pack year is smoking an average of one pack of cigarettes a day for 1 year.  Yearly screening should continue until it has been 15 years since you quit.  Yearly screening should stop if you develop a health problem that would prevent you from having lung cancer treatment.  Breast Cancer  Practice breast self-awareness. This means understanding how your breasts normally appear and feel.  It also means doing regular breast self-exams. Let your health care provider know about any changes, no matter how small.  If you are in your 20s or 30s, you should have a clinical breast exam (CBE) by a health care provider every 1-3 years as part of a regular health exam.  If you are 19 or older, have a CBE every year. Also consider having a breast X-ray (mammogram) every year.  If you have a family history of breast cancer, talk to your  health care provider about genetic screening.  If you are at high risk for breast cancer, talk to your health care provider about having an MRI and a mammogram every year.  Breast cancer gene (BRCA) assessment is recommended for women who have family members with BRCA-related cancers. BRCA-related cancers include: ? Breast. ? Ovarian. ? Tubal. ? Peritoneal cancers.  Results of the assessment will determine the need for genetic counseling and BRCA1 and BRCA2 testing.  Cervical Cancer Your health care provider may recommend that you be screened regularly for cancer of the pelvic organs  (ovaries, uterus, and vagina). This screening involves a pelvic examination, including checking for microscopic changes to the surface of your cervix (Pap test). You may be encouraged to have this screening done every 3 years, beginning at age 29.  For women ages 80-65, health care providers may recommend pelvic exams and Pap testing every 3 years, or they may recommend the Pap and pelvic exam, combined with testing for human papilloma virus (HPV), every 5 years. Some types of HPV increase your risk of cervical cancer. Testing for HPV may also be done on women of any age with unclear Pap test results.  Other health care providers may not recommend any screening for nonpregnant women who are considered low risk for pelvic cancer and who do not have symptoms. Ask your health care provider if a screening pelvic exam is right for you.  If you have had past treatment for cervical cancer or a condition that could lead to cancer, you need Pap tests and screening for cancer for at least 20 years after your treatment. If Pap tests have been discontinued, your risk factors (such as having a new sexual partner) need to be reassessed to determine if screening should resume. Some women have medical problems that increase the chance of getting cervical cancer. In these cases, your health care provider may recommend more frequent screening and Pap tests.  Colorectal Cancer  This type of cancer can be detected and often prevented.  Routine colorectal cancer screening usually begins at 73 years of age and continues through 73 years of age.  Your health care provider may recommend screening at an earlier age if you have risk factors for colon cancer.  Your health care provider may also recommend using home test kits to check for hidden blood in the stool.  A small camera at the end of a tube can be used to examine your colon directly (sigmoidoscopy or colonoscopy). This is done to check for the earliest forms of  colorectal cancer.  Routine screening usually begins at age 17.  Direct examination of the colon should be repeated every 5-10 years through 73 years of age. However, you may need to be screened more often if early forms of precancerous polyps or small growths are found.  Skin Cancer  Check your skin from head to toe regularly.  Tell your health care provider about any new moles or changes in moles, especially if there is a change in a mole's shape or color.  Also tell your health care provider if you have a mole that is larger than the size of a pencil eraser.  Always use sunscreen. Apply sunscreen liberally and repeatedly throughout the day.  Protect yourself by wearing long sleeves, pants, a wide-brimmed hat, and sunglasses whenever you are outside.  Heart disease, diabetes, and high blood pressure  High blood pressure causes heart disease and increases the risk of stroke. High blood pressure is more likely to  develop in: ? People who have blood pressure in the high end of the normal range (130-139/85-89 mm Hg). ? People who are overweight or obese. ? People who are African American.  If you are 72-24 years of age, have your blood pressure checked every 3-5 years. If you are 38 years of age or older, have your blood pressure checked every year. You should have your blood pressure measured twice-once when you are at a hospital or clinic, and once when you are not at a hospital or clinic. Record the average of the two measurements. To check your blood pressure when you are not at a hospital or clinic, you can use: ? An automated blood pressure machine at a pharmacy. ? A home blood pressure monitor.  If you are between 73 years and 11 years old, ask your health care provider if you should take aspirin to prevent strokes.  Have regular diabetes screenings. This involves taking a blood sample to check your fasting blood sugar level. ? If you are at a normal weight and have a low risk  for diabetes, have this test once every three years after 73 years of age. ? If you are overweight and have a high risk for diabetes, consider being tested at a younger age or more often. Preventing infection Hepatitis B  If you have a higher risk for hepatitis B, you should be screened for this virus. You are considered at high risk for hepatitis B if: ? You were born in a country where hepatitis B is common. Ask your health care provider which countries are considered high risk. ? Your parents were born in a high-risk country, and you have not been immunized against hepatitis B (hepatitis B vaccine). ? You have HIV or AIDS. ? You use needles to inject street drugs. ? You live with someone who has hepatitis B. ? You have had sex with someone who has hepatitis B. ? You get hemodialysis treatment. ? You take certain medicines for conditions, including cancer, organ transplantation, and autoimmune conditions.  Hepatitis C  Blood testing is recommended for: ? Everyone born from [birth year] through 1966. ? Anyone with known risk factors for hepatitis C.  Sexually transmitted infections (STIs)  You should be screened for sexually transmitted infections (STIs) including gonorrhea and chlamydia if: ? You are sexually active and are younger than 73 years of age. ? You are older than 73 years of age and your health care provider tells you that you are at risk for this type of infection. ? Your sexual activity has changed since you were last screened and you are at an increased risk for chlamydia or gonorrhea. Ask your health care provider if you are at risk.  If you do not have HIV, but are at risk, it may be recommended that you take a prescription medicine daily to prevent HIV infection. This is called pre-exposure prophylaxis (PrEP). You are considered at risk if: ? You are sexually active and do not regularly use condoms or know the HIV status of your partner(s). ? You take drugs by  injection. ? You are sexually active with a partner who has HIV.  Talk with your health care provider about whether you are at high risk of being infected with HIV. If you choose to begin PrEP, you should first be tested for HIV. You should then be tested every 3 months for as long as you are taking PrEP. Pregnancy  If you are premenopausal and you may become pregnant,  ask your health care provider about preconception counseling.  If you may become pregnant, take 400 to 800 micrograms (mcg) of folic acid every day.  If you want to prevent pregnancy, talk to your health care provider about birth control (contraception). Osteoporosis and menopause  Osteoporosis is a disease in which the bones lose minerals and strength with aging. This can result in serious bone fractures. Your risk for osteoporosis can be identified using a bone density scan.  If you are 79 years of age or older, or if you are at risk for osteoporosis and fractures, ask your health care provider if you should be screened.  Ask your health care provider whether you should take a calcium or vitamin D supplement to lower your risk for osteoporosis.  Menopause may have certain physical symptoms and risks.  Hormone replacement therapy may reduce some of these symptoms and risks. Talk to your health care provider about whether hormone replacement therapy is right for you. Follow these instructions at home:  Schedule regular health, dental, and eye exams.  Stay current with your immunizations.  Do not use any tobacco products including cigarettes, chewing tobacco, or electronic cigarettes.  If you are pregnant, do not drink alcohol.  If you are breastfeeding, limit how much and how often you drink alcohol.  Limit alcohol intake to no more than 1 drink per day for nonpregnant women. One drink equals 12 ounces of beer, 5 ounces of wine, or 1 ounces of hard liquor.  Do not use street drugs.  Do not share needles.  Ask  your health care provider for help if you need support or information about quitting drugs.  Tell your health care provider if you often feel depressed.  Tell your health care provider if you have ever been abused or do not feel safe at home. This information is not intended to replace advice given to you by your health care provider. Make sure you discuss any questions you have with your health care provider. Document Released: 08/16/2010 Document Revised: 07/09/2015 Document Reviewed: 11/04/2014 Elsevier Interactive Patient Education  2018 [place]. Borna Wessinger M.D.

## 2017-05-02 ENCOUNTER — Ambulatory Visit (INDEPENDENT_AMBULATORY_CARE_PROVIDER_SITE_OTHER): Payer: Medicare Other | Admitting: Internal Medicine

## 2017-05-02 ENCOUNTER — Encounter: Payer: Self-pay | Admitting: Internal Medicine

## 2017-05-02 ENCOUNTER — Ambulatory Visit (INDEPENDENT_AMBULATORY_CARE_PROVIDER_SITE_OTHER): Payer: Medicare Other

## 2017-05-02 VITALS — BP 122/80 | HR 70 | Temp 97.4°F | Ht 60.5 in | Wt 138.4 lb

## 2017-05-02 VITALS — BP 122/80 | Ht 61.0 in | Wt 138.0 lb

## 2017-05-02 DIAGNOSIS — E785 Hyperlipidemia, unspecified: Secondary | ICD-10-CM

## 2017-05-02 DIAGNOSIS — I10 Essential (primary) hypertension: Secondary | ICD-10-CM

## 2017-05-02 DIAGNOSIS — Z0000 Encounter for general adult medical examination without abnormal findings: Secondary | ICD-10-CM | POA: Diagnosis not present

## 2017-05-02 DIAGNOSIS — Z79899 Other long term (current) drug therapy: Secondary | ICD-10-CM

## 2017-05-02 DIAGNOSIS — M1990 Unspecified osteoarthritis, unspecified site: Secondary | ICD-10-CM

## 2017-05-02 DIAGNOSIS — R011 Cardiac murmur, unspecified: Secondary | ICD-10-CM | POA: Diagnosis not present

## 2017-05-02 NOTE — Patient Instructions (Addendum)
Continue lifestyle intervention healthy eating and exercise . Will notify you  of labs when available.  Consider Getting an echo test to evaluated murmur .   But can follow  Exam   Since you are doing well.    Can make  wellness visit with Wynetta Fines our health coach     Health Maintenance, Female Adopting a healthy lifestyle and getting preventive care can go a long way to promote health and wellness. Talk with your health care provider about what schedule of regular examinations is right for you. This is a good chance for you to check in with your provider about disease prevention and staying healthy. In between checkups, there are plenty of things you can do on your own. Experts have done a lot of research about which lifestyle changes and preventive measures are most likely to keep you healthy. Ask your health care provider for more information. Weight and diet Eat a healthy diet  Be sure to include plenty of vegetables, fruits, low-fat dairy products, and lean protein.  Do not eat a lot of foods high in solid fats, added sugars, or salt.  Get regular exercise. This is one of the most important things you can do for your health. ? Most adults should exercise for at least 150 minutes each week. The exercise should increase your heart rate and make you sweat (moderate-intensity exercise). ? Most adults should also do strengthening exercises at least twice a week. This is in addition to the moderate-intensity exercise.  Maintain a healthy weight  Body mass index (BMI) is a measurement that can be used to identify possible weight problems. It estimates body fat based on height and weight. Your health care provider can help determine your BMI and help you achieve or maintain a healthy weight.  For females 16 years of age and older: ? A BMI below 18.5 is considered underweight. ? A BMI of 18.5 to 24.9 is normal. ? A BMI of 25 to 29.9 is considered overweight. ? A BMI of 30 and above is  considered obese.  Watch levels of cholesterol and blood lipids  You should start having your blood tested for lipids and cholesterol at 73 years of age, then have this test every 5 years.  You may need to have your cholesterol levels checked more often if: ? Your lipid or cholesterol levels are high. ? You are older than 73 years of age. ? You are at high risk for heart disease.  Cancer screening Lung Cancer  Lung cancer screening is recommended for adults 50-65 years old who are at high risk for lung cancer because of a history of smoking.  A yearly low-dose CT scan of the lungs is recommended for people who: ? Currently smoke. ? Have quit within the past 15 years. ? Have at least a 30-pack-year history of smoking. A pack year is smoking an average of one pack of cigarettes a day for 1 year.  Yearly screening should continue until it has been 15 years since you quit.  Yearly screening should stop if you develop a health problem that would prevent you from having lung cancer treatment.  Breast Cancer  Practice breast self-awareness. This means understanding how your breasts normally appear and feel.  It also means doing regular breast self-exams. Let your health care provider know about any changes, no matter how small.  If you are in your 20s or 30s, you should have a clinical breast exam (CBE) by a health care  provider every 1-3 years as part of a regular health exam.  If you are 44 or older, have a CBE every year. Also consider having a breast X-ray (mammogram) every year.  If you have a family history of breast cancer, talk to your health care provider about genetic screening.  If you are at high risk for breast cancer, talk to your health care provider about having an MRI and a mammogram every year.  Breast cancer gene (BRCA) assessment is recommended for women who have family members with BRCA-related cancers. BRCA-related cancers  include: ? Breast. ? Ovarian. ? Tubal. ? Peritoneal cancers.  Results of the assessment will determine the need for genetic counseling and BRCA1 and BRCA2 testing.  Cervical Cancer Your health care provider may recommend that you be screened regularly for cancer of the pelvic organs (ovaries, uterus, and vagina). This screening involves a pelvic examination, including checking for microscopic changes to the surface of your cervix (Pap test). You may be encouraged to have this screening done every 3 years, beginning at age 3.  For women ages 27-65, health care providers may recommend pelvic exams and Pap testing every 3 years, or they may recommend the Pap and pelvic exam, combined with testing for human papilloma virus (HPV), every 5 years. Some types of HPV increase your risk of cervical cancer. Testing for HPV may also be done on women of any age with unclear Pap test results.  Other health care providers may not recommend any screening for nonpregnant women who are considered low risk for pelvic cancer and who do not have symptoms. Ask your health care provider if a screening pelvic exam is right for you.  If you have had past treatment for cervical cancer or a condition that could lead to cancer, you need Pap tests and screening for cancer for at least 20 years after your treatment. If Pap tests have been discontinued, your risk factors (such as having a new sexual partner) need to be reassessed to determine if screening should resume. Some women have medical problems that increase the chance of getting cervical cancer. In these cases, your health care provider may recommend more frequent screening and Pap tests.  Colorectal Cancer  This type of cancer can be detected and often prevented.  Routine colorectal cancer screening usually begins at 73 years of age and continues through 73 years of age.  Your health care provider may recommend screening at an earlier age if you have risk factors  for colon cancer.  Your health care provider may also recommend using home test kits to check for hidden blood in the stool.  A small camera at the end of a tube can be used to examine your colon directly (sigmoidoscopy or colonoscopy). This is done to check for the earliest forms of colorectal cancer.  Routine screening usually begins at age 27.  Direct examination of the colon should be repeated every 5-10 years through 73 years of age. However, you may need to be screened more often if early forms of precancerous polyps or small growths are found.  Skin Cancer  Check your skin from head to toe regularly.  Tell your health care provider about any new moles or changes in moles, especially if there is a change in a mole's shape or color.  Also tell your health care provider if you have a mole that is larger than the size of a pencil eraser.  Always use sunscreen. Apply sunscreen liberally and repeatedly throughout the day.  Protect yourself by wearing long sleeves, pants, a wide-brimmed hat, and sunglasses whenever you are outside.  Heart disease, diabetes, and high blood pressure  High blood pressure causes heart disease and increases the risk of stroke. High blood pressure is more likely to develop in: ? People who have blood pressure in the high end of the normal range (130-139/85-89 mm Hg). ? People who are overweight or obese. ? People who are African American.  If you are 57-39 years of age, have your blood pressure checked every 3-5 years. If you are 19 years of age or older, have your blood pressure checked every year. You should have your blood pressure measured twice-once when you are at a hospital or clinic, and once when you are not at a hospital or clinic. Record the average of the two measurements. To check your blood pressure when you are not at a hospital or clinic, you can use: ? An automated blood pressure machine at a pharmacy. ? A home blood pressure monitor.  If  you are between 19 years and 38 years old, ask your health care provider if you should take aspirin to prevent strokes.  Have regular diabetes screenings. This involves taking a blood sample to check your fasting blood sugar level. ? If you are at a normal weight and have a low risk for diabetes, have this test once every three years after 73 years of age. ? If you are overweight and have a high risk for diabetes, consider being tested at a younger age or more often. Preventing infection Hepatitis B  If you have a higher risk for hepatitis B, you should be screened for this virus. You are considered at high risk for hepatitis B if: ? You were born in a country where hepatitis B is common. Ask your health care provider which countries are considered high risk. ? Your parents were born in a high-risk country, and you have not been immunized against hepatitis B (hepatitis B vaccine). ? You have HIV or AIDS. ? You use needles to inject street drugs. ? You live with someone who has hepatitis B. ? You have had sex with someone who has hepatitis B. ? You get hemodialysis treatment. ? You take certain medicines for conditions, including cancer, organ transplantation, and autoimmune conditions.  Hepatitis C  Blood testing is recommended for: ? Everyone born from [birth year] through 1966. ? Anyone with known risk factors for hepatitis C.  Sexually transmitted infections (STIs)  You should be screened for sexually transmitted infections (STIs) including gonorrhea and chlamydia if: ? You are sexually active and are younger than 73 years of age. ? You are older than 73 years of age and your health care provider tells you that you are at risk for this type of infection. ? Your sexual activity has changed since you were last screened and you are at an increased risk for chlamydia or gonorrhea. Ask your health care provider if you are at risk.  If you do not have HIV, but are at risk, it may be recommended  that you take a prescription medicine daily to prevent HIV infection. This is called pre-exposure prophylaxis (PrEP). You are considered at risk if: ? You are sexually active and do not regularly use condoms or know the HIV status of your partner(s). ? You take drugs by injection. ? You are sexually active with a partner who has HIV.  Talk with your health care provider about whether you are at high risk of  being infected with HIV. If you choose to begin PrEP, you should first be tested for HIV. You should then be tested every 3 months for as long as you are taking PrEP. Pregnancy  If you are premenopausal and you may become pregnant, ask your health care provider about preconception counseling.  If you may become pregnant, take 400 to 800 micrograms (mcg) of folic acid every day.  If you want to prevent pregnancy, talk to your health care provider about birth control (contraception). Osteoporosis and menopause  Osteoporosis is a disease in which the bones lose minerals and strength with aging. This can result in serious bone fractures. Your risk for osteoporosis can be identified using a bone density scan.  If you are 63 years of age or older, or if you are at risk for osteoporosis and fractures, ask your health care provider if you should be screened.  Ask your health care provider whether you should take a calcium or vitamin D supplement to lower your risk for osteoporosis.  Menopause may have certain physical symptoms and risks.  Hormone replacement therapy may reduce some of these symptoms and risks. Talk to your health care provider about whether hormone replacement therapy is right for you. Follow these instructions at home:  Schedule regular health, dental, and eye exams.  Stay current with your immunizations.  Do not use any tobacco products including cigarettes, chewing tobacco, or electronic cigarettes.  If you are pregnant, do not drink alcohol.  If you are  breastfeeding, limit how much and how often you drink alcohol.  Limit alcohol intake to no more than 1 drink per day for nonpregnant women. One drink equals 12 ounces of beer, 5 ounces of wine, or 1 ounces of hard liquor.  Do not use street drugs.  Do not share needles.  Ask your health care provider for help if you need support or information about quitting drugs.  Tell your health care provider if you often feel depressed.  Tell your health care provider if you have ever been abused or do not feel safe at home. This information is not intended to replace advice given to you by your health care provider. Make sure you discuss any questions you have with your health care provider. Document Released: 08/16/2010 Document Revised: 07/09/2015 Document Reviewed: 11/04/2014 Elsevier Interactive Patient Education  [employer].

## 2017-05-02 NOTE — Progress Notes (Addendum)
Subjective:   Briana Giles is a 72 y.o. female who presents for Medicare Annual (Subsequent) preventive examination.  Reports health good   Cardiac Risk Factors include: advanced age (>47men, >66 women)Diet Does not eat sweets  Sticks to a healthy diet   [facility]; does yoga or silver sneakers spouse and her walk 2 .5 miles every day  There are no preventive care reminders to display for this patient. Colonoscopy 12/2012 to repeat in 10 year  Mammogram 10/2016 - due in Sept   Bone Density -1.2 01/2015 -  Does not see a GYN; but Rh does follow this  Recommended the osteoporosis foundation site to be sure she is getting her calcium in      Objective:     Vitals: BP 122/80   Ht 5\' 1"  (1.549 m)   Wt 138 lb (62.6 kg)   BMI 26.07 kg/m   Body mass index is 26.07 kg/m.  Advanced Directives 05/02/2017 08/28/2014  Does Patient Have a Medical Advance Directive? Yes Yes  Type of Advance Directive - Living will  Copy of [place] in Chart? - Yes   Hearing Screening Comments: Hearing issues Has ringing in her ears  Vision Screening Comments: Vision problems  Last vision check a month ago Had cataract surgery x 72 yo MD is in [place] History   Tobacco Use  Smoking Status Never Smoker  Smokeless Tobacco Never Used     Counseling given: Yes   Clinical Intake:   Past Medical History:  Diagnosis Date  . Abdominal pain, other specified site   . Abnormal LFTs   . Allergic rhinitis   . Cervical back pain with evidence of disc disease   . GERD (gastroesophageal reflux disease)   . Heart murmur    says all her life had echo in [place] year ago not to worry  . Hypertension   . Nerve damage    left arm  . Osteoarthritis   . Osteopenia   . Phlebitis   . Spinal stenosis   . Transient global amnesia    Past Surgical History:  Procedure Laterality Date  . BUNIONECTOMY     fall 2022 [facility]  .  CHOLECYSTECTOMY    . FOOT SURGERY      x 2    . HAND SURGERY     right hand  . KNEE ARTHROSCOPY     right  . REPLACEMENT UNICONDYLAR JOINT KNEE     rt knee  . TONSILLECTOMY    . TOTAL KNEE ARTHROPLASTY     right   Family History  Problem Relation Age of Onset  . Hypertension Father   . Alcohol abuse Father   . Lung cancer Father   . Cancer Maternal Grandmother        sinus  . COPD Mother   . Osteoporosis Mother   . Heart disease Mother   . Hypertension Brother   . Prostate cancer Maternal Grandfather   . Valvular heart disease Brother   . Colon cancer Neg Hx   . Pancreatic cancer Neg Hx   . Stomach cancer Neg Hx    Social History   Socioeconomic History  . Marital status: Married    Spouse name: Not on file  . Number of children: 2  . Years of education: Not on file  . Highest education level: Not on file  Occupational History  . Occupation: retired Pharmacist, hospital  Employer: RETIRED  Social Needs  . Financial resource strain: Not on file  . Food insecurity:    Worry: Not on file    Inability: Not on file  . Transportation needs:    Medical: Not on file    Non-medical: Not on file  Tobacco Use  . Smoking status: Never Smoker  . Smokeless tobacco: Never Used  Substance and Sexual Activity  . Alcohol use: Yes    Comment: occasional   . Drug use: No  . Sexual activity: Not on file  Lifestyle  . Physical activity:    Days per week: Not on file    Minutes per session: Not on file  . Stress: Not on file  Relationships  . Social connections:    Talks on phone: Not on file    Gets together: Not on file    Attends religious service: Not on file    Active member of club or organization: Not on file    Attends meetings of clubs or organizations: Not on file    Relationship status: Not on file  Other Topics Concern  . 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  [place] grand child    Does water exercise aerobics walking elliptical     Outpatient Encounter Medications as of 05/02/2017  Medication Sig  . acetaminophen (TYLENOL) 500 MG tablet Take 1,000 mg by mouth every 6 (six) hours as needed for mild pain, moderate pain or headache.   Marland Kitchen aspirin 81 MG tablet Take 81 mg by mouth daily.    . calcium carbonate (OS-CAL - DOSED IN MG OF ELEMENTAL CALCIUM) 1250 (500 CA) MG tablet Take 1,000 mg by mouth daily.  . celecoxib (CELEBREX) 100 MG capsule Take 1 capsule (100 mg total) by mouth daily. (Patient not taking: Reported on 12/15/2016)  . celecoxib (CELEBREX) 100 MG capsule TAKE 1 CAPSULE(100 MG) BY MOUTH DAILY (Patient not taking: Reported on 05/02/2017)  . CHERRY SYRUP PO Take by mouth daily.  . cycloSPORINE (RESTASIS) 0.05 % ophthalmic emulsion Place 1 drop into the right eye 2 (two) times daily.  . fluticasone (FLONASE) 50 MCG/ACT nasal spray USE TWO SPRAYS IN EACH NOSTRIL ONCE DAILY. (Patient taking differently: USE TWO SPRAYS IN EACH NOSTRIL ONCE DAILY AS NEEDED FOR ALLERGIES)  . Ginger, Zingiber officinalis, (GINGER ROOT PO) Take by mouth daily.  Marland Kitchen lidocaine (LIDODERM) 5 % Place 1 patch onto the skin daily as needed (pain). Reported on 03/30/2015  . lidocaine (LIDODERM) 5 % Place 2 patches onto the skin daily. Remove & Discard patch within 12 hours or as directed by MD  . lisinopril (PRINIVIL,ZESTRIL) 20 MG tablet TAKE 1 TABLET BY MOUTH EVERY DAY  . Omega-3 Fatty Acids (OMEGA 3 500 PO) Take by mouth daily.  Marland Kitchen omeprazole (PRILOSEC) 20 MG capsule TAKE ONE CAPSULE BY MOUTH ONCE EVERY DAY  . polyethylene glycol powder (MIRALAX) powder Take 17 g by mouth once.  . traMADol (ULTRAM) 50 MG tablet Take 50 mg by mouth 2 (two) times daily as needed for moderate pain or severe pain.   . traMADol (ULTRAM) 50 MG tablet 1 tablet by mouth twice a day when necessary  . TURMERIC PO Take 1 tablet by mouth daily.    No facility-administered encounter  medications on file as of 05/02/2017.     Activities of Daily Living  In your present state of health, do you have any difficulty performing the following activities: 05/02/2017  Hearing? N  Vision? N  Difficulty concentrating or making decisions? N  Walking or climbing stairs? N  Dressing or bathing? N  Doing errands, shopping? N  Preparing Food and eating ? N  Using the Toilet? N  In the past six months, have you accidently leaked urine? N  Do you have problems with loss of bowel control? N  Comment constipation   Managing your Medications? N  Managing your Finances? N  Housekeeping or managing your Housekeeping? N  Some recent data might be hidden    Patient Care Team: Panosh, Standley Brooking, MD as PCP - General [facility], Tamala Fothergill, DPM (Podiatry) Bo Merino, MD (Rheumatology) Milus Banister, MD (Gastroenterology) Jari Pigg, MD as Attending Physician (Dermatology) Laurance Flatten, MD as Referring Physician (Orthopedic Surgery)    Assessment:   This is a routine wellness examination for [facility].  Exercise Activities and Dietary recommendations Current Exercise Habits: Structured exercise class, Time (Minutes): 60, Frequency (Times/Week): 5, Weekly Exercise (Minutes/Week): 300, Intensity: Moderate  Goals      General   . Patient Stated     Try to get 10000 every day        Fall Risk Fall Risk  05/02/2017 03/18/2015 03/18/2015 01/06/2014 12/31/2012  Falls in the past year? No (No Data) Yes No No  Comment - fell  when hiking  down in [place] with Fall? - No - - -    Depression Screen PHQ 2/9 Scores 05/02/2017 03/30/2016 03/18/2015 01/06/2014  PHQ - 2 Score 0 0 0 0     Cognitive Function   Ad8 score reviewed for issues:  Issues making decisions:  Less interest in hobbies / activities:  Repeats questions, stories (family complaining):  Trouble using ordinary gadgets (microwave, computer, phone):  Forgets the month or year:   Mismanaging finances:    Remembering appts:  Daily problems with thinking and/or memory: Ad8 score is=0          Immunization History  Administered Date(s) Administered  . Influenza Split 12/27/2011, 10/15/2012  . Influenza Whole 12/03/2007, 12/24/2009, 10/25/2010  . Influenza, High Dose Seasonal PF 12/31/2013, 11/06/2014  . Influenza-Unspecified 11/14/2016  . Pneumococcal Conjugate-13 01/06/2014  . Pneumococcal Polysaccharide-23 12/21/2010  . Td 05/27/2008  . Tdap 01/06/2014  . Zoster 12/03/2007       Screening Tests Health Maintenance  Topic Date Due  . MAMMOGRAM  10/28/2018  . COLONOSCOPY  12/29/2022  . TETANUS/TDAP  01/07/2024  . INFLUENZA VACCINE  Completed  . DEXA SCAN  Completed  . Hepatitis C Screening  Completed  . PNA vac Low Risk Adult  Completed        Plan:      PCP Notes   Health Maintenance Is very proactive with her health  12 to 15000 steps per day Healthy diet   Educated regarding shingrix  Colonoscopy 12/2012 to repeat in 10 year  Mammogram 10/2016 - due in Sept   Bone Density -1.2 01/2015 - Does not see a GYN;  States her [place] doctor follows her dexa   Recommended the osteoporosis foundation site to be sure she is getting her calcium and other general information    Abnormal Screens  none  Referrals  None X to pharmacy for shingrix   Patient concerns; States she does have some ringing in her ears. States it does not bother her and she ignores  it   Nurse Concerns; As noted   Next PCP apt Seen Dr. Peggye Ley today    I have personally reviewed and noted the following in the patient's chart:   . Medical and social history . Use of alcohol, tobacco or illicit drugs  . Current medications and supplements . Functional ability and status . Nutritional status . Physical activity . Advanced directives . List of other physicians . Hospitalizations, surgeries, and ER visits in previous 12 months . Vitals . Screenings to include cognitive,  depression, and falls . Referrals and appointments  In addition, I have reviewed and discussed with patient certain preventive protocols, quality metrics, and best practice recommendations. A written personalized care plan for preventive services as well as general preventive health recommendations were provided to patient.     Shanon Ace, MD  05/04/2017   Above noted reviewed and agree. Shanon Ace, MD

## 2017-05-02 NOTE — Patient Instructions (Addendum)
Briana Giles , Thank you for taking time to come for your Medicare Wellness Visit. I appreciate your ongoing commitment to your health goals. Please review the following plan we discussed and let me know if I can assist you in the future.   Recommendations for Dexa Scan Female over the age of 6 Man age 73 or older If you broke a bone past the age of 20 Women menopausal age with risk factors (thin frame; smoker; hx of fx ) Post menopausal women under the age of 34 with risk factors A man age 71 to 31 with risk factors Other: Spine xray that is showing break of bone loss Back pain with possible break Height loss of 1/2 inch or more within one year Total loss in height of 1.5 inches from your original height  Calcium 1265m with Vit D 800u per day; more as directed by physician Strength building exercises discussed; can include walking; housework; small weights or stretch bands; silver sneakers if [facility] to the [facility]  Please visit the osteoporosis foundation.org for up to date recommendations    These are the goals we discussed: Goals    . Patient Stated     Try to get 10000 every day        This is a list of the screening recommended for you and due dates:  Health Maintenance  Topic Date Due  . Mammogram  10/28/2018  . Colon Cancer Screening  12/29/2022  . Tetanus Vaccine  01/07/2024  . Flu Shot  Completed  . DEXA scan (bone density measurement)  Completed  .  Hepatitis C: One time screening is recommended by Center for Disease Control  (CDC) for  adults born from [birth year]through 1965.   Completed  . Pneumonia vaccines  Completed      Fall Prevention in the Home Falls can cause injuries. They can happen to people of all ages. There are many things you can do to make your home safe and to help prevent falls. What can I do on the outside of my home?  Regularly fix the edges of walkways and driveways and fix any cracks.  Remove anything that might make you trip as you walk  through a door, such as a raised step or threshold.  Trim any bushes or trees on the path to your home.  Use bright outdoor lighting.  Clear any walking paths of anything that might make someone trip, such as rocks or tools.  Regularly check to see if handrails are loose or broken. Make sure that both sides of any steps have handrails.  Any raised decks and porches should have guardrails on the edges.  Have any leaves, snow, or ice cleared regularly.  Use sand or salt on walking paths during winter.  Clean up any spills in your garage right away. This includes oil or grease spills. What can I do in the bathroom?  Use night lights.  Install grab bars by the toilet and in the tub and shower. Do not use towel bars as grab bars.  Use non-skid mats or decals in the tub or shower.  If you need to sit down in the shower, use a plastic, non-slip stool.  Keep the floor dry. Clean up any water that spills on the floor as soon as it happens.  Remove soap buildup in the tub or shower regularly.  Attach bath mats securely with double-sided non-slip rug tape.  Do not have throw rugs and other things on the floor that  can make you trip. What can I do in the bedroom?  Use night lights.  Make sure that you have a light by your bed that is easy to reach.  Do not use any sheets or blankets that are too big for your bed. They should not hang down onto the floor.  Have a firm chair that has side arms. You can use this for support while you get dressed.  Do not have throw rugs and other things on the floor that can make you trip. What can I do in the kitchen?  Clean up any spills right away.  Avoid walking on wet floors.  Keep items that you use a lot in easy-to-reach places.  If you need to reach something above you, use a strong step stool that has a grab bar.  Keep electrical cords out of the way.  Do not use floor polish or wax that makes floors slippery. If you must use wax,  use non-skid floor wax.  Do not have throw rugs and other things on the floor that can make you trip. What can I do with my stairs?  Do not leave any items on the stairs.  Make sure that there are handrails on both sides of the stairs and use them. Fix handrails that are broken or loose. Make sure that handrails are as long as the stairways.  Check any carpeting to make sure that it is firmly attached to the stairs. Fix any carpet that is loose or worn.  Avoid having throw rugs at the top or bottom of the stairs. If you do have throw rugs, attach them to the floor with carpet tape.  Make sure that you have a light switch at the top of the stairs and the bottom of the stairs. If you do not have them, ask someone to add them for you. What else can I do to help prevent falls?  Wear shoes that: ? Do not have high heels. ? Have rubber bottoms. ? Are comfortable and fit you well. ? Are closed at the toe. Do not wear sandals.  If you use a stepladder: ? Make sure that it is fully opened. Do not climb a closed stepladder. ? Make sure that both sides of the stepladder are locked into place. ? Ask someone to hold it for you, if possible.  Clearly mark and make sure that you can see: ? Any grab bars or handrails. ? First and last steps. ? Where the edge of each step is.  Use tools that help you move around (mobility aids) if they are needed. These include: ? Canes. ? Walkers. ? Scooters. ? Crutches.  Turn on the lights when you go into a dark area. Replace any light bulbs as soon as they burn out.  Set up your furniture so you have a clear path. Avoid moving your furniture around.  If any of your floors are uneven, fix them.  If there are any pets around you, be aware of where they are.  Review your medicines with your doctor. Some medicines can make you feel dizzy. This can increase your chance of falling. Ask your doctor what other things that you can do to help prevent  falls. This information is not intended to replace advice given to you by your health care provider. Make sure you discuss any questions you have with your health care provider. Document Released: 11/27/2008 Document Revised: 07/09/2015 Document Reviewed: 03/07/2014 Elsevier Interactive Patient Education  [employer].  Health Maintenance, Female Adopting a healthy lifestyle and getting preventive care can go a long way to promote health and wellness. Talk with your health care provider about what schedule of regular examinations is right for you. This is a good chance for you to check in with your provider about disease prevention and staying healthy. In between checkups, there are plenty of things you can do on your own. Experts have done a lot of research about which lifestyle changes and preventive measures are most likely to keep you healthy. Ask your health care provider for more information. Weight and diet Eat a healthy diet  Be sure to include plenty of vegetables, fruits, low-fat dairy products, and lean protein.  Do not eat a lot of foods high in solid fats, added sugars, or salt.  Get regular exercise. This is one of the most important things you can do for your health. ? Most adults should exercise for at least 150 minutes each week. The exercise should increase your heart rate and make you sweat (moderate-intensity exercise). ? Most adults should also do strengthening exercises at least twice a week. This is in addition to the moderate-intensity exercise.  Maintain a healthy weight  Body mass index (BMI) is a measurement that can be used to identify possible weight problems. It estimates body fat based on height and weight. Your health care provider can help determine your BMI and help you achieve or maintain a healthy weight.  For females 74 years of age and older: ? A BMI below 18.5 is considered underweight. ? A BMI of 18.5 to 24.9 is normal. ? A BMI of 25 to 29.9 is  considered overweight. ? A BMI of 30 and above is considered obese.  Watch levels of cholesterol and blood lipids  You should start having your blood tested for lipids and cholesterol at 73 years of age, then have this test every 5 years.  You may need to have your cholesterol levels checked more often if: ? Your lipid or cholesterol levels are high. ? You are older than 73 years of age. ? You are at high risk for heart disease.  Cancer screening Lung Cancer  Lung cancer screening is recommended for adults 75-51 years old who are at high risk for lung cancer because of a history of smoking.  A yearly low-dose CT scan of the lungs is recommended for people who: ? Currently smoke. ? Have quit within the past 15 years. ? Have at least a 30-pack-year history of smoking. A pack year is smoking an average of one pack of cigarettes a day for 1 year.  Yearly screening should continue until it has been 15 years since you quit.  Yearly screening should stop if you develop a health problem that would prevent you from having lung cancer treatment.  Breast Cancer  Practice breast self-awareness. This means understanding how your breasts normally appear and feel.  It also means doing regular breast self-exams. Let your health care provider know about any changes, no matter how small.  If you are in your 20s or 30s, you should have a clinical breast exam (CBE) by a health care provider every 1-3 years as part of a regular health exam.  If you are 48 or older, have a CBE every year. Also consider having a breast X-ray (mammogram) every year.  If you have a family history of breast cancer, talk to your health care provider about genetic screening.  If you are at high risk  for breast cancer, talk to your health care provider about having an MRI and a mammogram every year.  Breast cancer gene (BRCA) assessment is recommended for women who have family members with BRCA-related cancers.  BRCA-related cancers include: ? Breast. ? Ovarian. ? Tubal. ? Peritoneal cancers.  Results of the assessment will determine the need for genetic counseling and BRCA1 and BRCA2 testing.  Cervical Cancer Your health care provider may recommend that you be screened regularly for cancer of the pelvic organs (ovaries, uterus, and vagina). This screening involves a pelvic examination, including checking for microscopic changes to the surface of your cervix (Pap test). You may be encouraged to have this screening done every 3 years, beginning at age 32.  For women ages 58-65, health care providers may recommend pelvic exams and Pap testing every 3 years, or they may recommend the Pap and pelvic exam, combined with testing for human papilloma virus (HPV), every 5 years. Some types of HPV increase your risk of cervical cancer. Testing for HPV may also be done on women of any age with unclear Pap test results.  Other health care providers may not recommend any screening for nonpregnant women who are considered low risk for pelvic cancer and who do not have symptoms. Ask your health care provider if a screening pelvic exam is right for you.  If you have had past treatment for cervical cancer or a condition that could lead to cancer, you need Pap tests and screening for cancer for at least 20 years after your treatment. If Pap tests have been discontinued, your risk factors (such as having a new sexual partner) need to be reassessed to determine if screening should resume. Some women have medical problems that increase the chance of getting cervical cancer. In these cases, your health care provider may recommend more frequent screening and Pap tests.  Colorectal Cancer  This type of cancer can be detected and often prevented.  Routine colorectal cancer screening usually begins at 73 years of age and continues through 73 years of age.  Your health care provider may recommend screening at an earlier age if  you have risk factors for colon cancer.  Your health care provider may also recommend using home test kits to check for hidden blood in the stool.  A small camera at the end of a tube can be used to examine your colon directly (sigmoidoscopy or colonoscopy). This is done to check for the earliest forms of colorectal cancer.  Routine screening usually begins at age 54.  Direct examination of the colon should be repeated every 5-10 years through 73 years of age. However, you may need to be screened more often if early forms of precancerous polyps or small growths are found.  Skin Cancer  Check your skin from head to toe regularly.  Tell your health care provider about any new moles or changes in moles, especially if there is a change in a mole's shape or color.  Also tell your health care provider if you have a mole that is larger than the size of a pencil eraser.  Always use sunscreen. Apply sunscreen liberally and repeatedly throughout the day.  Protect yourself by wearing long sleeves, pants, a wide-brimmed hat, and sunglasses whenever you are outside.  Heart disease, diabetes, and high blood pressure  High blood pressure causes heart disease and increases the risk of stroke. High blood pressure is more likely to develop in: ? People who have blood pressure in the high end of  the normal range (130-139/85-89 mm Hg). ? People who are overweight or obese. ? People who are African American.  If you are 74-98 years of age, have your blood pressure checked every 3-5 years. If you are 52 years of age or older, have your blood pressure checked every year. You should have your blood pressure measured twice-once when you are at a hospital or clinic, and once when you are not at a hospital or clinic. Record the average of the two measurements. To check your blood pressure when you are not at a hospital or clinic, you can use: ? An automated blood pressure machine at a pharmacy. ? A home blood  pressure monitor.  If you are between 13 years and 68 years old, ask your health care provider if you should take aspirin to prevent strokes.  Have regular diabetes screenings. This involves taking a blood sample to check your fasting blood sugar level. ? If you are at a normal weight and have a low risk for diabetes, have this test once every three years after 73 years of age. ? If you are overweight and have a high risk for diabetes, consider being tested at a younger age or more often. Preventing infection Hepatitis B  If you have a higher risk for hepatitis B, you should be screened for this virus. You are considered at high risk for hepatitis B if: ? You were born in a country where hepatitis B is common. Ask your health care provider which countries are considered high risk. ? Your parents were born in a high-risk country, and you have not been immunized against hepatitis B (hepatitis B vaccine). ? You have HIV or AIDS. ? You use needles to inject street drugs. ? You live with someone who has hepatitis B. ? You have had sex with someone who has hepatitis B. ? You get hemodialysis treatment. ? You take certain medicines for conditions, including cancer, organ transplantation, and autoimmune conditions.  Hepatitis C  Blood testing is recommended for: ? Everyone born from [birth year] through 1966. ? Anyone with known risk factors for hepatitis C.  Sexually transmitted infections (STIs)  You should be screened for sexually transmitted infections (STIs) including gonorrhea and chlamydia if: ? You are sexually active and are younger than 73 years of age. ? You are older than 73 years of age and your health care provider tells you that you are at risk for this type of infection. ? Your sexual activity has changed since you were last screened and you are at an increased risk for chlamydia or gonorrhea. Ask your health care provider if you are at risk.  If you do not have HIV, but are at risk,  it may be recommended that you take a prescription medicine daily to prevent HIV infection. This is called pre-exposure prophylaxis (PrEP). You are considered at risk if: ? You are sexually active and do not regularly use condoms or know the HIV status of your partner(s). ? You take drugs by injection. ? You are sexually active with a partner who has HIV.  Talk with your health care provider about whether you are at high risk of being infected with HIV. If you choose to begin PrEP, you should first be tested for HIV. You should then be tested every 3 months for as long as you are taking PrEP. Pregnancy  If you are premenopausal and you may become pregnant, ask your health care provider about preconception counseling.  If you may become  pregnant, take 400 to 800 micrograms (mcg) of folic acid every day.  If you want to prevent pregnancy, talk to your health care provider about birth control (contraception). Osteoporosis and menopause  Osteoporosis is a disease in which the bones lose minerals and strength with aging. This can result in serious bone fractures. Your risk for osteoporosis can be identified using a bone density scan.  If you are 78 years of age or older, or if you are at risk for osteoporosis and fractures, ask your health care provider if you should be screened.  Ask your health care provider whether you should take a calcium or vitamin D supplement to lower your risk for osteoporosis.  Menopause may have certain physical symptoms and risks.  Hormone replacement therapy may reduce some of these symptoms and risks. Talk to your health care provider about whether hormone replacement therapy is right for you. Follow these instructions at home:  Schedule regular health, dental, and eye exams.  Stay current with your immunizations.  Do not use any tobacco products including cigarettes, chewing tobacco, or electronic cigarettes.  If you are pregnant, do not drink  alcohol.  If you are breastfeeding, limit how much and how often you drink alcohol.  Limit alcohol intake to no more than 1 drink per day for nonpregnant women. One drink equals 12 ounces of beer, 5 ounces of wine, or 1 ounces of hard liquor.  Do not use street drugs.  Do not share needles.  Ask your health care provider for help if you need support or information about quitting drugs.  Tell your health care provider if you often feel depressed.  Tell your health care provider if you have ever been abused or do not feel safe at home. This information is not intended to replace advice given to you by your health care provider. Make sure you discuss any questions you have with your health care provider. Document Released: 08/16/2010 Document Revised: 07/09/2015 Document Reviewed: 11/04/2014 Elsevier Interactive Patient Education  [employer].

## 2017-05-03 LAB — HEPATIC FUNCTION PANEL
ALBUMIN: 4.8 g/dL (ref 3.5–5.2)
ALT: 22 U/L (ref 0–35)
AST: 28 U/L (ref 0–37)
Alkaline Phosphatase: 90 U/L (ref 39–117)
Bilirubin, Direct: 0.1 mg/dL (ref 0.0–0.3)
TOTAL PROTEIN: 7.2 g/dL (ref 6.0–8.3)
Total Bilirubin: 0.3 mg/dL (ref 0.2–1.2)

## 2017-05-03 LAB — BASIC METABOLIC PANEL
BUN: 28 mg/dL — AB (ref 6–23)
CHLORIDE: 101 meq/L (ref 96–112)
CO2: 27 meq/L (ref 19–32)
Calcium: 10.2 mg/dL (ref 8.4–10.5)
Creatinine, Ser: 0.89 mg/dL (ref 0.40–1.20)
GFR: 66.15 mL/min (ref >60.00)
GLUCOSE: 87 mg/dL (ref 70–99)
Potassium: 4.9 mEq/L (ref 3.5–5.1)
Sodium: 141 mEq/L (ref 135–145)

## 2017-05-03 LAB — LIPID PANEL
Cholesterol: 236 mg/dL — ABNORMAL HIGH (ref 0–200)
HDL: 67.4 mg/dL (ref >39.00)
LDL CALC: 134 mg/dL — AB (ref 0–99)
NONHDL: 168.29
Total CHOL/HDL Ratio: 3
Triglycerides: 171 mg/dL — ABNORMAL HIGH (ref 0.0–149.0)
VLDL: 34.2 mg/dL (ref 0.0–40.0)

## 2017-05-03 LAB — TSH: TSH: 2.45 u[IU]/mL (ref 0.35–4.50)

## 2017-05-31 NOTE — Progress Notes (Signed)
Office Visit Note  Patient: Briana Giles             Date of Birth: [birth year]           MRN: [id number]             PCP: Madelin Headings, MD Referring: Madelin Headings, MD Visit Date: 06/14/2017 Occupation: @GUAROCC @    Subjective:  Left hamstring pain   History of Present Illness: Briana Giles is a 72 y.o. female with history of osteoarthritis and DDD.  Patient states that she strained her hamstring in the middle of March and continues to have discomfort.  She states that her orthopedist referred her to physical therapy which has been helping the pain.  She denies any injury but feels that she overstretched during yoga.  She states that her right knee continues to cause discomfort especially when walking long distances.  She states she is no longer taking Celebrex and is noticed more discomfort since being off is otherwise.  She takes tramadol on as-needed basis for pain relief.  She states that she is going on a hiking trip in September she would like a refill of Celebrex.  She has started to get back to her normal exercise regimen including walking and yoga.  She states she occasionally has discomfort in her bilateral feet.  She also continues to have trochanteric bursitis bilaterally.  She denies any joint swelling at this time.  She states that she continues to work on range of motion in her neck she has no discomfort in her neck at this time.  She denies any pain in her hands at this time.   Activities of Daily Living:  Patient reports morning stiffness for 3-5  minutes.   Patient Reports nocturnal pain.  Difficulty dressing/grooming: Denies Difficulty climbing stairs: Reports Difficulty getting out of chair: Reports Difficulty using hands for taps, buttons, cutlery, and/or writing: Denies   Review of Systems  Constitutional: Positive for fatigue.  HENT: Negative for mouth sores, trouble swallowing, trouble swallowing, mouth dryness and nose dryness.   Eyes: Negative for  pain, visual disturbance and dryness.  Respiratory: Negative for cough, hemoptysis, shortness of breath and difficulty breathing.   Cardiovascular: Negative for chest pain, palpitations, hypertension and swelling in legs/feet.  Gastrointestinal: Positive for constipation. Negative for blood in stool and diarrhea.  Endocrine: Negative for increased urination.  Genitourinary: Negative for painful urination.  Musculoskeletal: Positive for arthralgias, joint pain and morning stiffness. Negative for joint swelling, myalgias, muscle weakness, muscle tenderness and myalgias.  Skin: Negative for color change, pallor, rash, hair loss, nodules/bumps, skin tightness, ulcers and sensitivity to sunlight.  Allergic/Immunologic: Negative for susceptible to infections.  Neurological: Negative for dizziness, numbness, headaches and weakness.  Hematological: Negative for swollen glands.  Psychiatric/Behavioral: Negative for depressed mood and sleep disturbance. The patient is not nervous/anxious.     PMFS History:  Patient Active Problem List   Diagnosis Date Noted  . DJD (degenerative joint disease), cervical 06/08/2016  . DDD (degenerative disc disease), lumbar 06/08/2016  . Abnormal serum angiotensin-converting enzyme level 06/08/2016  . History of partial knee replacement 05/31/2016  . Allergic rhinitis   . Chronic prescription benzodiazepine use 01/06/2014  . Acute upper respiratory infections of unspecified site 10/05/2013  . Persistent cough for 3 weeks or longer 10/05/2013  . 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 Abdominal bruit 12/31/2012  . Arthritis, rheumatoid (HCC) 12/31/2012  . Encounter for Medicare annual  wellness exam 12/31/2012  . Degenerative joint disease of spine multilevel 03/07/2012  . Spinal stenosis, lumbar 03/07/2012  . Hip pain 09/02/2011  . Sciatica of left side 08/14/2011  . Spinal stenosis of lumbar region 08/14/2011  . Encounter  for long-term (current) use of other medications 06/25/2011  . High risk medication use 12/21/2010  . Need for lipid screening 12/21/2010  . Foot pain, left 07/22/2010  . CHEST PAIN 02/18/2010  . Osteopenia 07/05/2009  . TREMOR, RIGHT HAND 01/26/2009  . BACK PAIN 09/30/2008  . NEOPLASM, SKIN, UNCERTAIN BEHAVIOR 05/27/2008  . HYPERPOTASSEMIA 05/27/2008  . Essential hypertension 10/12/2007  . TRANSIENT GLOBAL AMNESIA 10/12/2007  . VARICOSE VEINS, LOWER EXTREMITIES 06/06/2007  . SYSTOLIC MURMUR 06/06/2007  . ABDOMINAL BRUIT 06/06/2007  . ALLERGIC RHINITIS 08/17/2006  . GERD 08/17/2006  . Osteoarthritis 08/17/2006  . Disturbance in sleep behavior 08/17/2006    Past Medical History:  Diagnosis Date  . Abdominal pain, other specified site   . Abnormal LFTs   . Allergic rhinitis   . Cervical back pain with evidence of disc disease   . GERD (gastroesophageal reflux disease)   . Heart murmur    says all her life had echo in [place] year ago not to worry  . Hypertension   . Nerve damage    left arm  . Osteoarthritis   . Osteopenia   . Phlebitis   . Spinal stenosis   . Transient global amnesia     Family History  Problem Relation Age of Onset  . Hypertension Father   . Alcohol abuse Father   . Lung cancer Father   . Cancer Maternal Grandmother        sinus  . COPD Mother   . Osteoporosis Mother   . Heart disease Mother   . Hypertension Brother   . Prostate cancer Maternal Grandfather   . Valvular heart disease Brother   . Colon cancer Neg Hx   . Pancreatic cancer Neg Hx   . Stomach cancer Neg Hx    Past Surgical History:  Procedure Laterality Date  . BUNIONECTOMY     fall 2022 [facility]  . CHOLECYSTECTOMY    . FOOT SURGERY      x 2    . HAND SURGERY     right hand  . KNEE ARTHROSCOPY     right  . REPLACEMENT UNICONDYLAR JOINT KNEE     rt knee  . TONSILLECTOMY    . 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  [place] grand child    Does water exercise aerobics walking elliptical      Objective: Vital Signs: BP 103/63 (BP Location: Left Arm, Patient Position: Sitting, Cuff Size: Small)   Pulse 76   Resp 12   Ht 5\' 1"  (1.549 m)   Wt 139 lb (63 kg)   BMI 26.26 kg/m    Physical Exam  Constitutional: She is oriented to person, place, and time. She appears well-developed and well-nourished.  HENT:  Head: Normocephalic and atraumatic.  Eyes: Conjunctivae and EOM are normal.  Neck: Normal range of motion.  Cardiovascular: Normal rate, regular rhythm, normal heart sounds and intact distal pulses.  Pulmonary/Chest: Effort normal and breath sounds normal.  Abdominal: Soft. Bowel sounds are normal.  Lymphadenopathy:  She has no cervical adenopathy.  Neurological: She is alert and oriented to person, place, and time.  Skin: Skin is warm and dry. Capillary refill takes less than 2 seconds.  Psychiatric: She has a normal mood and affect. Her behavior is normal.  Nursing note and vitals reviewed.    Musculoskeletal Exam: C-spine limited range of motion.  Thoracic and lumbar spine good range of motion.  No midline spinal tenderness.  No SI joint tenderness.  Shoulder joints, elbow joints, wrist joints, MCPs, PIPs, DIPs good range of motion with no synovitis.  She has PIP and DIP synovial thickening consistent with osteoarthritis.  Hip joints, knee joints, ankle joints, MTPs, PIPs, DIPs good range of motion with no synovitis.  No warmth or effusion of bilateral knees.  She has good strength in bilateral lower extremities.  She has mild tenderness in the popliteal region.  CDAI Exam: No CDAI exam completed.    Investigation: No additional findings.   Imaging: No results found.  Speciality Comments: No specialty comments available.    Procedures:  No procedures performed Allergies:  Codeine phosphate   Assessment / Plan:     Visit Diagnoses: Primary osteoarthritis of both hands: She has PIP and DIP synovial thickening consistent with osteoarthritis of bilateral hands.  Joint protection muscle strengthening were discussed.  She has no synovitis or discomfort at this time.  Primary osteoarthritis of both feet: She has PIP and DIP synovial thickening consistent with osteoarthritis of bilateral feet.  She wears proper fitting shoes.  Strain of left hamstring: In mid March she strained her left hamstring overstretching at yoga.  She continues to have discomfort.  She has been going to physical therapy which has been helping her symptoms.  She is advised to continue to do the stretching exercises.  She has good strength on exam.  Trochanteric bursitis of both hips: She has tenderness of trochanteric bursa bilaterally.  Perform stretching exercises on a regular basis.  She also goes to yoga which has been helping her symptoms.   History of partial knee replacement: Good range of motion on exam.  No warmth or effusion.  She experiences discomfort in her right knee especially when walking long distances.  She was given a refill of Celebrex due to her upcoming hiking trip in September.  Most recent BMP revealed creatinine and GFR were within normal limits.  History of bunionectomy - of left great toe: No discomfort at this time  Other chronic pain - Tramadol 50 mg po bid prn. UDS: 06/14/2016. Narc agreement: 06/14/2016.  Takes tramadol very sparingly.  DDD (degenerative disc disease), cervical: She has limited range of motion of her C-spine.  She performs exercises on a regular basis to work on range of motion.  DDD (degenerative disc disease), lumbar: She has no midline spinal tenderness.  She is good range of motion on exam.  She has no discomfort at this time.  Other medical conditions are listed as follows:  Osteopenia of multiple sites - According to patient's been followed by her  PCP  Elevated LFTs  Abnormal serum angiotensin-converting enzyme level - + ACE negative CXR.  History of varicose veins - lower extremities   History of gastroesophageal reflux (GERD)  History of hypertension    Orders: No orders of the defined types were placed in this encounter.  No orders of the defined types were placed in this encounter.   Face-to-face time spent with patient was 30 minutes. >50% of time was spent in counseling and  coordination of care.   Follow-Up Instructions: Return in about 6 months (around 12/15/2017) for Osteoarthritis, DDD.   Gearldine Bienenstock, PA-C   I examined and evaluated the patient with Sherron Ales PA. The plan of care was discussed as noted above.  Pollyann Savoy, MD  Note - This record has been created using Animal nutritionist.  Chart creation errors have been sought, but may not always  have been located. Such creation errors do not reflect on  the standard of medical care.

## 2017-06-14 ENCOUNTER — Encounter: Payer: Self-pay | Admitting: Rheumatology

## 2017-06-14 ENCOUNTER — Ambulatory Visit (INDEPENDENT_AMBULATORY_CARE_PROVIDER_SITE_OTHER): Payer: Medicare Other | Admitting: Rheumatology

## 2017-06-14 ENCOUNTER — Other Ambulatory Visit: Payer: Self-pay | Admitting: Internal Medicine

## 2017-06-14 VITALS — BP 103/63 | HR 76 | Resp 12 | Ht 61.0 in | Wt 139.0 lb

## 2017-06-14 DIAGNOSIS — M7061 Trochanteric bursitis, right hip: Secondary | ICD-10-CM

## 2017-06-14 DIAGNOSIS — G8929 Other chronic pain: Secondary | ICD-10-CM | POA: Diagnosis not present

## 2017-06-14 DIAGNOSIS — M8589 Other specified disorders of bone density and structure, multiple sites: Secondary | ICD-10-CM | POA: Diagnosis not present

## 2017-06-14 DIAGNOSIS — R748 Abnormal levels of other serum enzymes: Secondary | ICD-10-CM | POA: Diagnosis not present

## 2017-06-14 DIAGNOSIS — M51369 Other intervertebral disc degeneration, lumbar region without mention of lumbar back pain or lower extremity pain: Secondary | ICD-10-CM

## 2017-06-14 DIAGNOSIS — M5136 Other intervertebral disc degeneration, lumbar region: Secondary | ICD-10-CM | POA: Diagnosis not present

## 2017-06-14 DIAGNOSIS — Z9889 Other specified postprocedural states: Secondary | ICD-10-CM

## 2017-06-14 DIAGNOSIS — M19041 Primary osteoarthritis, right hand: Secondary | ICD-10-CM

## 2017-06-14 DIAGNOSIS — R7989 Other specified abnormal findings of blood chemistry: Secondary | ICD-10-CM

## 2017-06-14 DIAGNOSIS — M5030 Other cervical disc degeneration, unspecified cervical region: Secondary | ICD-10-CM | POA: Diagnosis not present

## 2017-06-14 DIAGNOSIS — Z8679 Personal history of other diseases of the circulatory system: Secondary | ICD-10-CM | POA: Diagnosis not present

## 2017-06-14 DIAGNOSIS — M19071 Primary osteoarthritis, right ankle and foot: Secondary | ICD-10-CM | POA: Diagnosis not present

## 2017-06-14 DIAGNOSIS — M19072 Primary osteoarthritis, left ankle and foot: Secondary | ICD-10-CM

## 2017-06-14 DIAGNOSIS — M7062 Trochanteric bursitis, left hip: Secondary | ICD-10-CM

## 2017-06-14 DIAGNOSIS — S76312D Strain of muscle, fascia and tendon of the posterior muscle group at thigh level, left thigh, subsequent encounter: Secondary | ICD-10-CM

## 2017-06-14 DIAGNOSIS — Z8719 Personal history of other diseases of the digestive system: Secondary | ICD-10-CM

## 2017-06-14 DIAGNOSIS — Z96659 Presence of unspecified artificial knee joint: Secondary | ICD-10-CM

## 2017-06-14 DIAGNOSIS — R945 Abnormal results of liver function studies: Secondary | ICD-10-CM

## 2017-06-14 DIAGNOSIS — M19042 Primary osteoarthritis, left hand: Secondary | ICD-10-CM

## 2017-06-14 MED ORDER — CELECOXIB 100 MG PO CAPS: ORAL_CAPSULE | ORAL | 0 refills | Status: DC

## 2017-07-13 ENCOUNTER — Other Ambulatory Visit: Payer: Self-pay | Admitting: Physician Assistant

## 2017-07-13 NOTE — Telephone Encounter (Signed)
Okay to refill Celebrex.  Her LFTs were normal in March.

## 2017-07-13 NOTE — Telephone Encounter (Signed)
Last Visit: 06/14/17 Next visit: 12/19/17 Labs: 05/02/17 BMP stable  Okay to refill Celebrex?

## 2017-08-04 NOTE — Progress Notes (Signed)
Office Visit Note  Patient: Briana Giles             Date of Birth: 1945/03/16           MRN: [id number]             PCP: Madelin Headings, MD Referring: Madelin Headings, MD Visit Date: 08/07/2017 Occupation: @GUAROCC @    Subjective:  Right 5th PIP joint pain   History of Present Illness: [name] is a 72 y.o. female with history of osteoarthritis, trochanteric bursitis bilaterally, and DDD.  Patient reports that she has been having increased discomfort in her left knee.  She was seeing Dr[facility] Prentiss B[facility] for [facility] of her left knee pain.  She went to physical therapy which helped her pain somewhat.  They opted into having an MRI of her left knee performed which revealed a meniscal tear.  She had a cortisone injection last week which has been helping her pain considerably.  She states the pain in her trochanteric bursitis bilaterally is also improved.  She has been taking ibuprofen and tramadol for pain relief.  She takes tramadol very sparingly.  She does not need any refills at this time.  She is no longer taking Celebrex.  She states that she developed increased pain and swelling in her right fifth PIP joint.  She states her finger became very stiff and has a lot of discomfort with range of motion.  She states overall her pain is improved since the cortisone injection in her left knee joint.  She denies any injury.   Activities of Daily Living:  Patient reports morning stiffness for 0 minutes.   Patient Denies nocturnal pain.  Difficulty dressing/grooming: Denies Difficulty climbing stairs: Reports Difficulty getting out of chair: Denies Difficulty using hands for taps, buttons, cutlery, and/or writing: Denies   Review of Systems  Constitutional: Negative for fatigue.  HENT: Negative for mouth sores, mouth dryness and nose dryness.   Eyes: Negative for pain, visual disturbance and dryness.  Respiratory: Negative for cough, hemoptysis, shortness of breath and difficulty  breathing.   Cardiovascular: Negative for chest pain, palpitations, hypertension and swelling in legs/feet.  Gastrointestinal: Positive for constipation. Negative for abdominal pain, blood in stool and diarrhea.  Endocrine: Negative for increased urination.  Genitourinary: Negative for painful urination and pelvic pain.  Musculoskeletal: Positive for arthralgias, joint pain and joint swelling. Negative for myalgias, muscle weakness, morning stiffness, muscle tenderness and myalgias.  Skin: Positive for rash. Negative for color change, pallor, hair loss, nodules/bumps, skin tightness, ulcers and sensitivity to sunlight.  Allergic/Immunologic: Negative for susceptible to infections.  Neurological: Negative for dizziness, light-headedness, numbness, headaches, memory loss and weakness.  Hematological: Negative for bruising/bleeding tendency and swollen glands.  Psychiatric/Behavioral: Negative for depressed mood, confusion and sleep disturbance. The patient is not nervous/anxious.     PMFS History:  Patient Active Problem List   Diagnosis Date Noted  . DJD (degenerative joint disease), cervical 06/08/2016  . DDD (degenerative disc disease), lumbar 06/08/2016  . Abnormal serum angiotensin-converting enzyme level 06/08/2016  . History of partial knee replacement 05/31/2016  . Allergic rhinitis   . Chronic prescription benzodiazepine use 01/06/2014  . Acute upper respiratory infections of unspecified site 10/05/2013  . Persistent cough for 3 weeks or longer 10/05/2013  . 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 Abdominal bruit 12/31/2012  . Arthritis, rheumatoid (HCC) 12/31/2012  . Encounter for Medicare annual wellness exam 12/31/2012  .  Degenerative joint disease of spine multilevel 03/07/2012  . Spinal stenosis, lumbar 03/07/2012  . Hip pain 09/02/2011  . Sciatica of left side 08/14/2011  . Spinal stenosis of lumbar region 08/14/2011  . Encounter for  long-term (current) use of other medications 06/25/2011  . High risk medication use 12/21/2010  . Need for lipid screening 12/21/2010  . Foot pain, left 07/22/2010  . CHEST PAIN 02/18/2010  . Osteopenia 07/05/2009  . TREMOR, RIGHT HAND 01/26/2009  . BACK PAIN 09/30/2008  . NEOPLASM, SKIN, UNCERTAIN BEHAVIOR 05/27/2008  . HYPERPOTASSEMIA 05/27/2008  . Essential hypertension 10/12/2007  . TRANSIENT GLOBAL AMNESIA 10/12/2007  . VARICOSE VEINS, LOWER EXTREMITIES 06/06/2007  . SYSTOLIC MURMUR 06/06/2007  . ABDOMINAL BRUIT 06/06/2007  . ALLERGIC RHINITIS 08/17/2006  . GERD 08/17/2006  . Osteoarthritis 08/17/2006  . Disturbance in sleep behavior 08/17/2006    Past Medical History:  Diagnosis Date  . Abdominal pain, other specified site   . Abnormal LFTs   . Allergic rhinitis   . Cervical back pain with evidence of disc disease   . GERD (gastroesophageal reflux disease)   . Heart murmur    says all her life had echo in [place] year ago not to worry  . Hypertension   . Nerve damage    left arm  . Osteoarthritis   . Osteopenia   . Phlebitis   . Spinal stenosis   . Transient global amnesia     Family History  Problem Relation Age of Onset  . Hypertension Father   . Alcohol abuse Father   . Lung cancer Father   . Cancer Maternal Grandmother        sinus  . COPD Mother   . Osteoporosis Mother   . Heart disease Mother   . Hypertension Brother   . Heart Problems Brother   . Arthritis Brother   . Prostate cancer Maternal Grandfather   . Heart attack Brother   . Hypertension Brother   . Healthy Daughter   . Healthy Son   . Colon cancer Neg Hx   . Pancreatic cancer Neg Hx   . Stomach cancer Neg Hx    Past Surgical History:  Procedure Laterality Date  . BUNIONECTOMY     fall 2022 [facility]  . CHOLECYSTECTOMY    . FOOT SURGERY      x 2    . HAND SURGERY     right hand  . KNEE ARTHROSCOPY     right  . REPLACEMENT UNICONDYLAR JOINT KNEE     rt knee  . TONSILLECTOMY      . 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  [place] grand child    Does water exercise aerobics walking elliptical      Objective: Vital Signs: BP 139/70 (BP Location: Left Arm, Patient Position: Sitting, Cuff Size: Normal)   Pulse 78   Resp 12   Ht 5\' 1"  (1.549 m)   Wt 137 lb (62.1 kg)   BMI 25.89 kg/m    Physical Exam  Constitutional: She is oriented to person, place, and time. She appears well-developed and well-nourished.  HENT:  Head: Normocephalic and atraumatic.  Eyes: Conjunctivae and EOM are normal.  Neck: Normal range of motion.  Cardiovascular: Normal rate, regular rhythm, normal heart sounds and intact distal  pulses.  Pulmonary/Chest: Effort normal and breath sounds normal.  Abdominal: Soft. Bowel sounds are normal.  Lymphadenopathy:    She has no cervical adenopathy.  Neurological: She is alert and oriented to person, place, and time.  Skin: Skin is warm and dry. Capillary refill takes less than 2 seconds.  Psychiatric: She has a normal mood and affect. Her behavior is normal.  Nursing note and vitals reviewed.    Musculoskeletal Exam: C-spine limited range of motion.  Thoracic and lumbar spine good range of motion.  No midline spinal tenderness.  No SI joint tenderness.  Shoulder joints, elbow joints, wrist joints, MCPs, PIPs, DIPs good range of motion with no synovitis.  She has PIP and DIP synovial thickening consistent with osteoarthritis of bilateral hands.  She is some tenderness of her right fifth PIP joint but no synovitis was noted.  She has slightly limited range of motion of her PIP and DIP joint of her fifth digit.  Hip joints, knee joints, ankle joints, MTPs, PIPs, DIPs good range of motion with no synovitis.  She has no warmth or effusion of bilateral knee joints.  No mechanical symptoms are noted with  range of motion.  She has tenderness of bilateral trochanteric bursa.  CDAI Exam: No CDAI exam completed.    Investigation: No additional findings.   Imaging: No results found.  Speciality Comments: No specialty comments available.    Procedures:  No procedures performed Allergies: Codeine phosphate   Assessment / Plan:     Visit Diagnoses: Primary osteoarthritis of both hands: She has PIP and DIP synovial thickening consistent with osteoarthritis of bilateral hands.  She has tenderness of the right fifth PIP joint. She denies any injury, but she has been having intermittent pain and swelling in this joint. She has no synovitis on exam.  She was encouraged to try to wear a splint on this finger.  Hand exercises were discussed.  She was given a handout of hand exercises.  She was encouraged to work on ROM.   Primary osteoarthritis of both feet: She has PIP and DIP synovial thickening consistent with osteoarthritis of bilateral feet.  She has no tenderness in her feet at this time.  No synovitis was noted.  She was proper fitting shoes which is been helping with her pain in her feet.  Trochanteric bursitis of both hips: She continues to have mild tenderness of bilateral trochanteric bursa.  She performs stretching exercises on a regular basis.  Her bursitis has improved since having a left knee cortisone injected last week.   History of partial knee replacement: Chronic discomfort.  She has good ROM.   Other chronic pain - Tramadol as needed for pain relief.  UDS and narcotic agreement were updated today on 08/07/2017.  She does not need any refills at this time.  DDD (degenerative disc disease), cervical: She continues to have limited range of motion of her C-spine.  She has no discomfort in her neck at this time.  No radiculopathy symptoms.  DDD (degenerative disc disease), lumbar: She has no midline spinal tenderness.  She has no discomfort in her lower back at this time.  She plans  on resuming yoga which has been helping with her flexibility.  Osteopenia of multiple sites -Her DEXA scans are followed by her PCP.  Medication monitoring encounter -UDS was updated today on 08/07/2017.  Plan: Pain Mgmt, Profile 5 w/Conf, U, Pain Mgmt, Tramadol w/medMATCH, U   History of bunionectomy: Doing well. No discomfort at this  time.   Other medical conditions are listed as follows:   History of hypertension  Elevated LFTs  Abnormal serum angiotensin-converting enzyme level - + ACE negative CXR.  History of varicose veins  History of gastroesophageal reflux (GERD)     Orders: Orders Placed This Encounter  Procedures  . Pain Mgmt, Profile 5 w/Conf, U  . Pain Mgmt, Tramadol w/medMATCH, U   No orders of the defined types were placed in this encounter.   Follow-Up Instructions: Return in about 6 months (around 02/06/2018) for Osteoarthritis, DDD.   Gearldine Bienenstock, PA-C   I examined and evaluated the patient with Sherron Ales PA. The plan of care was discussed as noted above.  Pollyann Savoy, MD  Note - This record has been created using Animal nutritionist.  Chart creation errors have been sought, but may not always  have been located. Such creation errors do not reflect on  the standard of medical care.

## 2017-08-07 ENCOUNTER — Encounter: Payer: Self-pay | Admitting: Rheumatology

## 2017-08-07 ENCOUNTER — Ambulatory Visit (INDEPENDENT_AMBULATORY_CARE_PROVIDER_SITE_OTHER): Payer: Medicare Other | Admitting: Rheumatology

## 2017-08-07 VITALS — BP 139/70 | HR 78 | Resp 12 | Ht 61.0 in | Wt 137.0 lb

## 2017-08-07 DIAGNOSIS — R748 Abnormal levels of other serum enzymes: Secondary | ICD-10-CM

## 2017-08-07 DIAGNOSIS — Z96659 Presence of unspecified artificial knee joint: Secondary | ICD-10-CM | POA: Diagnosis not present

## 2017-08-07 DIAGNOSIS — G8929 Other chronic pain: Secondary | ICD-10-CM | POA: Diagnosis not present

## 2017-08-07 DIAGNOSIS — Z8719 Personal history of other diseases of the digestive system: Secondary | ICD-10-CM

## 2017-08-07 DIAGNOSIS — Z5181 Encounter for therapeutic drug level monitoring: Secondary | ICD-10-CM

## 2017-08-07 DIAGNOSIS — M19041 Primary osteoarthritis, right hand: Secondary | ICD-10-CM | POA: Diagnosis not present

## 2017-08-07 DIAGNOSIS — M5030 Other cervical disc degeneration, unspecified cervical region: Secondary | ICD-10-CM | POA: Diagnosis not present

## 2017-08-07 DIAGNOSIS — Z9889 Other specified postprocedural states: Secondary | ICD-10-CM | POA: Diagnosis not present

## 2017-08-07 DIAGNOSIS — R945 Abnormal results of liver function studies: Secondary | ICD-10-CM | POA: Diagnosis not present

## 2017-08-07 DIAGNOSIS — M19042 Primary osteoarthritis, left hand: Secondary | ICD-10-CM

## 2017-08-07 DIAGNOSIS — M7061 Trochanteric bursitis, right hip: Secondary | ICD-10-CM

## 2017-08-07 DIAGNOSIS — M8589 Other specified disorders of bone density and structure, multiple sites: Secondary | ICD-10-CM | POA: Diagnosis not present

## 2017-08-07 DIAGNOSIS — Z8679 Personal history of other diseases of the circulatory system: Secondary | ICD-10-CM

## 2017-08-07 DIAGNOSIS — M19071 Primary osteoarthritis, right ankle and foot: Secondary | ICD-10-CM

## 2017-08-07 DIAGNOSIS — M19072 Primary osteoarthritis, left ankle and foot: Secondary | ICD-10-CM

## 2017-08-07 DIAGNOSIS — M7062 Trochanteric bursitis, left hip: Secondary | ICD-10-CM

## 2017-08-07 DIAGNOSIS — M5136 Other intervertebral disc degeneration, lumbar region: Secondary | ICD-10-CM | POA: Diagnosis not present

## 2017-08-07 DIAGNOSIS — R7989 Other specified abnormal findings of blood chemistry: Secondary | ICD-10-CM

## 2017-08-07 NOTE — Patient Instructions (Signed)

## 2017-08-09 ENCOUNTER — Other Ambulatory Visit: Payer: Self-pay | Admitting: Rheumatology

## 2017-08-09 NOTE — Telephone Encounter (Signed)
Last Visit: 08/07/17 Next visit: 01/31/18 Labs: 05/02/17 stable  Okay to refill per Dr. Estanislado Pandy

## 2017-08-10 LAB — PAIN MGMT, PROFILE 5 W/CONF, U
AMINOCLONAZEPAM: NEGATIVE ng/mL (ref <25)
AMPHETAMINES: NEGATIVE ng/mL (ref <500)
Alphahydroxyalprazolam: NEGATIVE ng/mL (ref <25)
Alphahydroxymidazolam: NEGATIVE ng/mL (ref <50)
Alphahydroxytriazolam: NEGATIVE ng/mL (ref <50)
BARBITURATES: NEGATIVE ng/mL (ref <300)
BENZODIAZEPINES: NEGATIVE ng/mL (ref <100)
Cocaine Metabolite: NEGATIVE ng/mL (ref <150)
Creatinine: 133.2 mg/dL
Hydroxyethylflurazepam: NEGATIVE ng/mL (ref <50)
Lorazepam: NEGATIVE ng/mL (ref <50)
METHADONE METABOLITE: NEGATIVE ng/mL (ref <100)
Marijuana Metabolite: NEGATIVE ng/mL (ref <20)
Nordiazepam: NEGATIVE ng/mL (ref <50)
Opiates: NEGATIVE ng/mL (ref <100)
Oxazepam: NEGATIVE ng/mL (ref <50)
Oxidant: NEGATIVE ug/mL (ref <200)
Oxycodone: NEGATIVE ng/mL (ref <100)
TEMAZEPAM: NEGATIVE ng/mL (ref <50)
pH: 5.83 (ref 4.5–9.0)

## 2017-08-10 LAB — PAIN MGMT, TRAMADOL W/MEDMATCH, U
DESMETHYLTRAMADOL: NEGATIVE ng/mL (ref <100)
Tramadol: NEGATIVE ng/mL (ref <100)

## 2017-08-10 NOTE — Progress Notes (Signed)
UDS negative. Patient takes tramadol PRN

## 2017-09-08 ENCOUNTER — Other Ambulatory Visit: Payer: Self-pay | Admitting: Rheumatology

## 2017-09-08 NOTE — Telephone Encounter (Signed)
Last Visit: 08/07/17 Next visit: 01/31/18 Labs: 05/02/17 stable  Okay to refill per Dr. Estanislado Pandy

## 2017-10-04 ENCOUNTER — Other Ambulatory Visit: Payer: Self-pay | Admitting: Rheumatology

## 2017-10-04 NOTE — Telephone Encounter (Signed)
Last Visit: 08/07/17 Next visit: 01/31/18 Labs: 05/02/17 stable  Okay to refill per Dr. Estanislado Pandy

## 2017-10-04 NOTE — Telephone Encounter (Signed)
ok 

## 2017-10-04 NOTE — Telephone Encounter (Signed)
Last Visit: 08/07/17 Next visit: 01/31/18 UDS: 08/07/17 UDS negative. Patient takes tramadol PRN Narc Agreement: 08/07/17  Last Fill: 12/16/16  Okay to refill Tramadol?

## 2017-10-23 ENCOUNTER — Telehealth: Payer: Self-pay

## 2017-10-23 NOTE — Telephone Encounter (Signed)
Copied from [place] [id number] [id number]. Topic: Inquiry >> Oct 23, 2017 12:31 PM Rutherford Nail, NT wrote: Reason for CRM: Patient calling and states that she is going on a big trip next week. States that the trip is causing her to have trouble sleeping. Patient would like to know if a medication could be sent to the pharmacy to help her sleep? CB#: [place], [place]

## 2017-10-23 NOTE — Telephone Encounter (Signed)
Please advise Dr Panosh, thanks.   

## 2017-10-24 NOTE — Telephone Encounter (Signed)
don't ususally advise sleeping aids  Because of side effects and not work that great but   Can try otc melatonin  2 hours pre sleep . Can send in trazadone  25 - 50 50 mg  See pended  Med    Would need ov  If other  intervention needed

## 2017-10-25 ENCOUNTER — Encounter: Payer: Self-pay | Admitting: Internal Medicine

## 2017-10-25 ENCOUNTER — Ambulatory Visit (INDEPENDENT_AMBULATORY_CARE_PROVIDER_SITE_OTHER): Payer: Medicare Other | Admitting: Internal Medicine

## 2017-10-25 VITALS — BP 122/64 | HR 72 | Temp 97.8°F | Wt 137.7 lb

## 2017-10-25 DIAGNOSIS — Z79899 Other long term (current) drug therapy: Secondary | ICD-10-CM

## 2017-10-25 DIAGNOSIS — Z23 Encounter for immunization: Secondary | ICD-10-CM | POA: Diagnosis not present

## 2017-10-25 DIAGNOSIS — G4700 Insomnia, unspecified: Secondary | ICD-10-CM | POA: Diagnosis not present

## 2017-10-25 MED ORDER — CLORAZEPATE DIPOTASSIUM 7.5 MG PO TABS: ORAL_TABLET | ORAL | 0 refills | Status: DC

## 2017-10-25 NOTE — Progress Notes (Signed)
Chief Complaint  Patient presents with  . Sleeping Problem    Pt here to discuss sleep issues and treatment options. pt having trouble falling to sleep and staying asleep all night. Pt states that she gets anxious when traveling and she travels a lot. states her legs feel restless at night.    HPI: Briana Giles 72 y.o. come in for Help with insomnia sleep difficulty that she gets before during her travel.  Next week leaving for a group trip with [employer] trip  In [place].  3 couples.   She started to have poor sleep and difficulty in the remote past did use some Tranxene with help but has not used it for years. Her knee can be problematic and she is back on Celebrex and is taking tramadol occasionally. Decide any new interventions.  Has not really been on anything but an over-the-counter sleep aid otherwise ROS: See pertinent positives and negatives per HPI.  Past Medical History:  Diagnosis Date  . Abdominal pain, other specified site   . Abnormal LFTs   . Allergic rhinitis   . Cervical back pain with evidence of disc disease   . GERD (gastroesophageal reflux disease)   . Heart murmur    says all her life had echo in [place] year ago not to worry  . Hypertension   . Nerve damage    left arm  . Osteoarthritis   . Osteopenia   . Phlebitis   . Spinal stenosis   . Transient global amnesia     Family History  Problem Relation Age of Onset  . Hypertension Father   . Alcohol abuse Father   . Lung cancer Father   . Cancer Maternal Grandmother        sinus  . COPD Mother   . Osteoporosis Mother   . Heart disease Mother   . Hypertension Brother   . Heart Problems Brother   . Arthritis Brother   . Prostate cancer Maternal Grandfather   . Heart attack Brother   . Hypertension Brother   . Healthy Daughter   . Healthy Son   . Colon cancer Neg Hx   . Pancreatic cancer Neg Hx   . Stomach cancer Neg Hx     Social History   Socioeconomic History    . Marital status: Married    Spouse name: Not on file  . Number of children: 2  . Years of education: Not on file  . Highest education level: Not on file  Occupational History  . Occupation: retired Product manager: RETIRED  Social Needs  . Financial resource strain: Not on file  . Food insecurity:    Worry: Not on file    Inability: Not on file  . Transportation needs:    Medical: Not on file    Non-medical: Not on file  Tobacco Use  . Smoking status: Never Smoker  . Smokeless tobacco: Never Used  Substance and Sexual Activity  . Alcohol use: Yes    Comment: occasional   . Drug use: Never  . Sexual activity: Not on file  Lifestyle  . Physical activity:    Days per week: Not on file    Minutes per session: Not on file  . Stress: Not on file  Relationships  . Social connections:    Talks on phone: Not on file    Gets together: Not on file    Attends religious service: Not on file  Active member of club or organization: Not on file    Attends meetings of clubs or organizations: Not on file    Relationship status: Not on file  Other Topics Concern  . 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  [place] grand child    Does water exercise aerobics walking elliptical     Outpatient Medications Prior to Visit  Medication Sig Dispense Refill  . acetaminophen (TYLENOL) 500 MG tablet Take 1,000 mg by mouth every 6 (six) hours as needed for mild pain, moderate pain or headache.     . calcium carbonate (OS-CAL - DOSED IN MG OF ELEMENTAL CALCIUM) 1250 (500 CA) MG tablet Take 1,000 mg by mouth daily.    . celecoxib (CELEBREX) 100 MG capsule TAKE 1 CAPSULE(100 MG) BY MOUTH DAILY AS NEEDED 30 capsule 0  . CHERRY SYRUP PO Take by mouth daily.     . fluticasone (FLONASE) 50 MCG/ACT nasal spray USE TWO SPRAYS IN EACH NOSTRIL ONCE DAILY. (Patient taking differently: USE TWO  SPRAYS IN EACH NOSTRIL ONCE DAILY AS NEEDED FOR ALLERGIES) 16 g 5  . ibuprofen (ADVIL,MOTRIN) 200 MG tablet Take 200 mg by mouth as needed.    . lidocaine (LIDODERM) 5 % Place 1 patch onto the skin daily as needed (pain). Reported on 03/30/2015    . lisinopril (PRINIVIL,ZESTRIL) 20 MG tablet TAKE 1 TABLET BY MOUTH EVERY DAY 90 tablet 1  . polyethylene glycol powder (MIRALAX) powder Take 17 g by mouth once. (Patient taking differently: Take 17 g by mouth as needed. ) 255 g 0  . traMADol (ULTRAM) 50 MG tablet TAKE 1 TABLET BY MOUTH TWICE DAILY AS NEEDED 60 tablet 0  . Ginger, Zingiber officinalis, (GINGER ROOT PO) Take by mouth daily.    . Omega-3 Fatty Acids (OMEGA 3 500 PO) Take by mouth daily.    . TURMERIC PO Take 1 tablet by mouth daily.      No facility-administered medications prior to visit.      EXAM:  BP 122/64 (BP Location: Right Arm, Patient Position: Sitting, Cuff Size: Normal)   Pulse 72   Temp 97.8 F (36.6 C) (Oral)   Wt 137 lb 11.2 oz (62.5 kg)   BMI 26.02 kg/m   Body mass index is 26.02 kg/m.  GENERAL: vitals reviewed and listed above, alert, oriented, appears well hydrated and in no acute distress HEENT: atraumatic, conjunctiva  clear, no obvious abnormalities on inspection of external nose and ears MS: moves all extremities without noticeable focal  abnormality PSYCH: pleasant and cooperative, no obvious depression or anxiety  BP Readings from Last 3 Encounters:  10/25/17 122/64  08/07/17 139/70  06/14/17 103/63    ASSESSMENT AND PLAN:  Discussed the following assessment and plan:  Insomnia, unspecified type  Need for influenza vaccination - Plan: Flu vaccine HIGH DOSE PF (Fluzone High dose)  Medication management Discussed medicine risk benefit risk of dependency on given medicines amnesia etc. and side effects and drug interactions. Since she is used this before we will go ahead and refill the clorazepate Tranxene to be used with caution not nightly  not with the tramadol Have a safe trip discussed sleep hygiene attention also.  Patient agrees. Pat aware of benefit risk  -Patient advised to return or notify health care team  if  new concerns arise.  Patient Instructions  Can use the tranxene    With caution as we discussed and avoid use with   he tramadol.   Do not use every night to avoid dependence .   Have a good trip.       Standley Brooking. Panosh M.D.

## 2017-10-25 NOTE — Patient Instructions (Addendum)
Can use the tranxene    With caution as we discussed and avoid use with   he tramadol.   Do not use every night to avoid dependence .   Have a good trip.

## 2017-10-25 NOTE — Telephone Encounter (Signed)
Pt being seen today in office for SDA at 11:15 Will discuss at that appt.  Nothing further needed.

## 2017-11-13 NOTE — Progress Notes (Signed)
Office Visit Note  Patient: Briana Giles             Date of Birth: Jun 19, 1945           MRN: [id number]             PCP: Madelin Headings, MD Referring: Madelin Headings, MD Visit Date: 11/15/2017 Occupation: @GUAROCC @  Subjective:  Right thumb pain.   History of Present Illness: Briana Giles is a 72 y.o. female with history of osteoarthritis, DDD.  She went on a trip to [place] in the end of September.  She states she used hiking sticks there.  She has been having discomfort in her right thumb.  She was also recently diagnosed with left knee joint meniscal tear by Dr. Despina Hick.  She will need  surgery for that.  Her right partial knee replacement has been painful as well.  She has been also having some lower back pain.  She takes tramadol on PRN basis which has been helpful.  Activities of Daily Living:  Patient reports morning stiffness for 5-10 minutes.   Patient Denies nocturnal pain.  Difficulty dressing/grooming: Denies Difficulty climbing stairs: Reports Difficulty getting out of chair: Denies Difficulty using hands for taps, buttons, cutlery, and/or writing: Denies  Review of Systems  Constitutional: Negative for fatigue, night sweats, weight gain and weight loss.  HENT: Negative for mouth sores, trouble swallowing, trouble swallowing, mouth dryness and nose dryness.   Eyes: Positive for dryness. Negative for pain, redness and visual disturbance.  Respiratory: Negative for cough, shortness of breath and difficulty breathing.   Cardiovascular: Negative for chest pain, palpitations, hypertension, irregular heartbeat and swelling in legs/feet.  Gastrointestinal: Positive for constipation. Negative for blood in stool and diarrhea.  Endocrine: Negative for increased urination.  Genitourinary: Negative for vaginal dryness.  Musculoskeletal: Positive for arthralgias, joint pain and morning stiffness. Negative for joint swelling, myalgias, muscle weakness, muscle tenderness and  myalgias.  Skin: Negative for color change, rash, hair loss, skin tightness, ulcers and sensitivity to sunlight.  Allergic/Immunologic: Negative for susceptible to infections.  Neurological: Negative for dizziness, memory loss, night sweats and weakness.  Hematological: Negative for swollen glands.  Psychiatric/Behavioral: Positive for sleep disturbance. Negative for depressed mood. The patient is not nervous/anxious.     PMFS History:  Patient Active Problem List   Diagnosis Date Noted  . DJD (degenerative joint disease), cervical 06/08/2016  . DDD (degenerative disc disease), lumbar 06/08/2016  . Abnormal serum angiotensin-converting enzyme level 06/08/2016  . History of partial knee replacement 05/31/2016  . Allergic rhinitis   . Chronic prescription benzodiazepine use 01/06/2014  . Acute upper respiratory infections of unspecified site 10/05/2013  . Persistent cough for 3 weeks or longer 10/05/2013  . 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 Abdominal bruit 12/31/2012  . Arthritis, rheumatoid (HCC) 12/31/2012  . Encounter for Medicare annual wellness exam 12/31/2012  . Degenerative joint disease of spine multilevel 03/07/2012  . Spinal stenosis, lumbar 03/07/2012  . Hip pain 09/02/2011  . Sciatica of left side 08/14/2011  . Spinal stenosis of lumbar region 08/14/2011  . Encounter for long-term (current) use of other medications 06/25/2011  . High risk medication use 12/21/2010  . Need for lipid screening 12/21/2010  . Foot pain, left 07/22/2010  . CHEST PAIN 02/18/2010  . Osteopenia 07/05/2009  . TREMOR, RIGHT HAND 01/26/2009  . BACK PAIN 09/30/2008  . NEOPLASM, SKIN, UNCERTAIN BEHAVIOR 05/27/2008  . HYPERPOTASSEMIA 05/27/2008  .  Essential hypertension 10/12/2007  . TRANSIENT GLOBAL AMNESIA 10/12/2007  . VARICOSE VEINS, LOWER EXTREMITIES 06/06/2007  . SYSTOLIC MURMUR 06/06/2007  . ABDOMINAL BRUIT 06/06/2007  . ALLERGIC RHINITIS  08/17/2006  . GERD 08/17/2006  . Osteoarthritis 08/17/2006  . Disturbance in sleep behavior 08/17/2006    Past Medical History:  Diagnosis Date  . Abdominal pain, other specified site   . Abnormal LFTs   . Allergic rhinitis   . Cervical back pain with evidence of disc disease   . GERD (gastroesophageal reflux disease)   . Heart murmur    says all her life had echo in [place] year ago not to worry  . Hypertension   . Nerve damage    left arm  . Osteoarthritis   . Osteopenia   . Phlebitis   . Spinal stenosis   . Transient global amnesia     Family History  Problem Relation Age of Onset  . Hypertension Father   . Alcohol abuse Father   . Lung cancer Father   . Cancer Maternal Grandmother        sinus  . COPD Mother   . Osteoporosis Mother   . Heart disease Mother   . Hypertension Brother   . Heart Problems Brother   . Arthritis Brother   . Prostate cancer Maternal Grandfather   . Heart attack Brother   . Hypertension Brother   . Healthy Daughter   . Healthy Son   . Colon cancer Neg Hx   . Pancreatic cancer Neg Hx   . Stomach cancer Neg Hx    Past Surgical History:  Procedure Laterality Date  . BUNIONECTOMY     fall 2022 [facility]  . CHOLECYSTECTOMY    . FOOT SURGERY      x 2    . HAND SURGERY     right hand  . KNEE ARTHROSCOPY     right  . REPLACEMENT UNICONDYLAR JOINT KNEE     rt knee  . TONSILLECTOMY    . 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  [place] grand child    Does water exercise aerobics walking elliptical     Objective: Vital Signs: BP 135/66 (BP Location: Left Arm, Patient Position: Sitting, Cuff Size: Normal)   Pulse 77   Resp 13   Ht 5\' 1"  (1.549 m)   Wt 137 lb 6.4 oz (62.3 kg)   BMI 25.96 kg/m    Physical Exam  Constitutional: She is oriented to person, place, and time. She appears  well-developed and well-nourished.  HENT:  Head: Normocephalic and atraumatic.  Eyes: Conjunctivae and EOM are normal.  Neck: Normal range of motion.  Cardiovascular: Normal rate, regular rhythm, normal heart sounds and intact distal pulses.  Pulmonary/Chest: Effort normal and breath sounds normal.  Abdominal: Soft. Bowel sounds are normal.  Lymphadenopathy:    She has no cervical adenopathy.  Neurological: She is alert and oriented to person, place, and time.  Skin: Skin is warm and dry. Capillary refill takes less than 2 seconds.  Psychiatric: She has a normal mood and affect. Her behavior is normal.  Nursing note and vitals reviewed.    Musculoskeletal Exam: C-spine thoracic lumbar spine limited range of motion with some discomfort.  Shoulder joints elbow joints wrist joints were in good  range of motion.  She had a mucinous cyst on her right first PIP joint.  She has some DIP and PIP thickening in her hands and feet.  Her right knee joint is replaced.  Left knee joint was in good range of motion with some discomfort.  CDAI Exam: CDAI Score: Not documented Patient Global Assessment: Not documented; Provider Global Assessment: Not documented Swollen: Not documented; Tender: Not documented Joint Exam   Not documented   There is currently no information documented on the homunculus. Go to the Rheumatology activity and complete the homunculus joint exam.  Investigation: No additional findings.  Imaging: No results found.  Recent Labs: Lab Results  Component Value Date   WBC 6.4 10/25/2016   HGB 13.3 10/25/2016   PLT 232 10/25/2016   NA 141 05/02/2017   K 4.9 05/02/2017   CL 101 05/02/2017   CO2 27 05/02/2017   GLUCOSE 87 05/02/2017   BUN 28 (H) 05/02/2017   CREATININE 0.89 05/02/2017   BILITOT 0.3 05/02/2017   ALKPHOS 90 05/02/2017   AST 28 05/02/2017   ALT 22 05/02/2017   PROT 7.2 05/02/2017   ALBUMIN 4.8 05/02/2017   CALCIUM 10.2 05/02/2017   GFRAA 68 12/15/2016      Speciality Comments: No specialty comments available.  Procedures:  No procedures performed Allergies: Codeine phosphate   Assessment / Plan:     Visit Diagnoses: Digital mucinous cyst-over right first PIP joint of her thumb.  She has appointment coming up with Dr. Amanda Pea.  Have advised her to discuss possible surgical resection.  It does not look infected.  Primary osteoarthritis of both hands-joint protection muscle strengthening was discussed.  Primary osteoarthritis of both feet-feet are better since she has been using proper fitting shoes with arch support.  History of partial knee replacement - right, she continues to have some chronic pain.  She has been also having discomfort in her left knee due to meniscal tear.  She states she has appointment coming up with Dr. Despina Hick.  Trochanteric bursitis of both hips-the pain is tolerable currently.  DDD (degenerative disc disease), cervical-she is having increased neck pain and muscle spasm.  Have given her prescription for tizanidine 4 mg p.o. nightly as needed I have advised her to use it only about 4-week.  Side effects were reviewed.  DDD (degenerative disc disease), lumbar-tolerable  Other chronic pain - tramadol. UDS: 6/24/2019Narc agreement: 08/07/2017.  She takes tramadol on PRN basis.  Osteopenia of multiple sites - Her DEXA scans are followed by her PCP.  Other medical problems are listed as follows:  History of gastroesophageal reflux (GERD)  Abnormal serum angiotensin-converting enzyme level -  + ACE negative CXR.  History of varicose veins  Elevated LFTs  History of bunionectomy  History of hypertension   Orders: No orders of the defined types were placed in this encounter.  Meds ordered this encounter  Medications  . tiZANidine (ZANAFLEX) 4 MG tablet    Sig: Take 1 tablet (4 mg total) by mouth at bedtime.    Dispense:  30 tablet    Refill:  0    Face-to-face time spent with patient was 30 minutes.  Greater than 50% of time was spent in counseling and coordination of care.  Follow-Up Instructions: Return for Osteoarthritis, DDD.   Pollyann Savoy, MD  Note - This record has been created using Animal nutritionist.  Chart creation errors have been sought, but may not always  have been located. Such creation errors do not reflect  on  the standard of medical care.

## 2017-11-15 ENCOUNTER — Encounter: Payer: Self-pay | Admitting: Rheumatology

## 2017-11-15 ENCOUNTER — Ambulatory Visit (INDEPENDENT_AMBULATORY_CARE_PROVIDER_SITE_OTHER): Payer: Medicare Other | Admitting: Rheumatology

## 2017-11-15 VITALS — BP 135/66 | HR 77 | Resp 13 | Ht 61.0 in | Wt 137.4 lb

## 2017-11-15 DIAGNOSIS — R7989 Other specified abnormal findings of blood chemistry: Secondary | ICD-10-CM

## 2017-11-15 DIAGNOSIS — G8929 Other chronic pain: Secondary | ICD-10-CM

## 2017-11-15 DIAGNOSIS — M19072 Primary osteoarthritis, left ankle and foot: Secondary | ICD-10-CM

## 2017-11-15 DIAGNOSIS — Z8719 Personal history of other diseases of the digestive system: Secondary | ICD-10-CM

## 2017-11-15 DIAGNOSIS — Z96659 Presence of unspecified artificial knee joint: Secondary | ICD-10-CM | POA: Diagnosis not present

## 2017-11-15 DIAGNOSIS — M19041 Primary osteoarthritis, right hand: Secondary | ICD-10-CM | POA: Diagnosis not present

## 2017-11-15 DIAGNOSIS — M67449 Ganglion, unspecified hand: Secondary | ICD-10-CM | POA: Diagnosis not present

## 2017-11-15 DIAGNOSIS — M19071 Primary osteoarthritis, right ankle and foot: Secondary | ICD-10-CM

## 2017-11-15 DIAGNOSIS — M8589 Other specified disorders of bone density and structure, multiple sites: Secondary | ICD-10-CM

## 2017-11-15 DIAGNOSIS — Z9889 Other specified postprocedural states: Secondary | ICD-10-CM

## 2017-11-15 DIAGNOSIS — Z8679 Personal history of other diseases of the circulatory system: Secondary | ICD-10-CM

## 2017-11-15 DIAGNOSIS — R945 Abnormal results of liver function studies: Secondary | ICD-10-CM

## 2017-11-15 DIAGNOSIS — M19042 Primary osteoarthritis, left hand: Secondary | ICD-10-CM

## 2017-11-15 DIAGNOSIS — R748 Abnormal levels of other serum enzymes: Secondary | ICD-10-CM

## 2017-11-15 DIAGNOSIS — M5136 Other intervertebral disc degeneration, lumbar region: Secondary | ICD-10-CM

## 2017-11-15 DIAGNOSIS — M7062 Trochanteric bursitis, left hip: Secondary | ICD-10-CM

## 2017-11-15 DIAGNOSIS — M5030 Other cervical disc degeneration, unspecified cervical region: Secondary | ICD-10-CM

## 2017-11-15 DIAGNOSIS — M7061 Trochanteric bursitis, right hip: Secondary | ICD-10-CM

## 2017-11-15 MED ORDER — TIZANIDINE HCL 4 MG PO TABS: 4.0000 mg | ORAL_TABLET | Freq: Every day | ORAL | 0 refills | Status: DC

## 2017-11-16 ENCOUNTER — Telehealth: Payer: Self-pay | Admitting: Rheumatology

## 2017-11-16 NOTE — Telephone Encounter (Signed)
Patient called stating at her appointment yesterday she was prescribed Tizanidine.  Patient states she didn't realize how sleepy that medication would make her and is requesting a "muscle relaxer that she can take during the day and non-drowsy."

## 2017-11-17 MED ORDER — METHOCARBAMOL 500 MG PO TABS: 500.0000 mg | ORAL_TABLET | Freq: Every day | ORAL | 1 refills | Status: DC

## 2017-11-17 NOTE — Telephone Encounter (Signed)
Patient states she took the Tizanidine during the middle of the night and was very drowsy the next day. Patient states she does not need a muscle relaxer at night time to help with sleep she needs a muscle relaxer during the day while she is active. Per Dr. Estanislado Pandy okay to send in a prescription for Robaxin 500 mg po daily # 30

## 2017-12-11 ENCOUNTER — Other Ambulatory Visit: Payer: Self-pay | Admitting: Rheumatology

## 2017-12-12 NOTE — Telephone Encounter (Signed)
Last visit: 11/15/17 Next visit: 01/31/18  Okay to refill per Dr. Estanislado Pandy

## 2017-12-14 ENCOUNTER — Other Ambulatory Visit: Payer: Self-pay | Admitting: Internal Medicine

## 2017-12-19 ENCOUNTER — Ambulatory Visit: Payer: Medicare Other | Admitting: Physician Assistant

## 2018-01-02 ENCOUNTER — Other Ambulatory Visit: Payer: Self-pay | Admitting: Rheumatology

## 2018-01-02 MED ORDER — TRAMADOL HCL 50 MG PO TABS: ORAL_TABLET | ORAL | 0 refills | Status: DC

## 2018-01-02 NOTE — Telephone Encounter (Signed)
Last visit: 11/15/17 Next visit: 01/31/18 UDS: 08/07/17 Narc Agreement: 08/07/17  Okay to refill Tramadol?

## 2018-01-02 NOTE — Telephone Encounter (Signed)
Patient left message on voicemail 8:04am that she needs a refill on her Tramadol today. Patient is going out of town Thursday, and needs to pick up RX today. Please call to advise.

## 2018-01-02 NOTE — Telephone Encounter (Signed)
ok 

## 2018-01-14 HISTORY — PX: CYST EXCISION: SHX5701

## 2018-01-16 ENCOUNTER — Other Ambulatory Visit: Payer: Self-pay | Admitting: *Deleted

## 2018-01-16 MED ORDER — TIZANIDINE HCL 4 MG PO TABS: ORAL_TABLET | ORAL | 0 refills | Status: DC

## 2018-01-16 NOTE — Telephone Encounter (Signed)
Refill request received via fax   Last visit: 11/15/17 Next visit: 01/31/18  Okay to refill per Dr. Estanislado Pandy

## 2018-01-17 NOTE — Progress Notes (Signed)
Office Visit Note  Patient: Briana Giles             Date of Birth: 18-Nov-1945           MRN: [id number]             PCP: Madelin Headings, MD Referring: Madelin Headings, MD Visit Date: 01/31/2018 Occupation: @GUAROCC @  Subjective:  Right thumb pain   History of Present Illness: Briana Giles is a 72 y.o. female with history of osteoarthritis and DDD. She takes robaxin 500 mg 1 tablet by mouth PRN for muscle spasms and zanaflex 4 mg po at bedtime prn for muscle spasms.  She takes tramadol 50 mg 1 tablet BID PRN for pain relief.  She does not need any refills at this time.  She states that she had a mucin cyst removed by Dr. Melvyn Novas last week.  She states she is going to be seeing him next week for suture removal.  She denies any drainage or signs of infection.   She states she has a left knee meniscal tear, and she was evaluated by Dr. Lequita Halt.  She is unsure if she is going to proceed with surgery, but she will be following up with him to further discuss in January 2020. She denies any joint swelling or buckling at this time.  She states the pain has improved since summer.  She continues to have intermittent trochanteric bursitis bilaterally.  She performs stretching exercises on a regular basis.  She goes to yoga regularly.   She has been having some discomfort in the right 5th PIP but denies any joint swelling.   Activities of Daily Living:  Patient reports morning stiffness for 10-15  minutes.   Patient Denies nocturnal pain.  Difficulty dressing/grooming: Denies Difficulty climbing stairs: Reports Difficulty getting out of chair: Denies Difficulty using hands for taps, buttons, cutlery, and/or writing: Denies  Review of Systems  Constitutional: Negative for fatigue.  HENT: Negative for mouth sores, mouth dryness and nose dryness.   Eyes: Negative for pain, visual disturbance and dryness.  Respiratory: Negative for cough, hemoptysis, shortness of breath and difficulty  breathing.   Cardiovascular: Negative for chest pain, palpitations, hypertension and swelling in legs/feet.  Gastrointestinal: Positive for constipation. Negative for blood in stool and diarrhea.  Endocrine: Negative for increased urination.  Genitourinary: Negative for painful urination.  Musculoskeletal: Positive for arthralgias, joint pain and morning stiffness. Negative for joint swelling, myalgias, muscle weakness, muscle tenderness and myalgias.  Skin: Negative for color change, pallor, rash, hair loss, nodules/bumps, skin tightness, ulcers and sensitivity to sunlight.  Allergic/Immunologic: Negative for susceptible to infections.  Neurological: Negative for dizziness, numbness, headaches and weakness.  Hematological: Negative for swollen glands.  Psychiatric/Behavioral: Negative for depressed mood and sleep disturbance. The patient is not nervous/anxious.     PMFS History:  Patient Active Problem List   Diagnosis Date Noted  . DJD (degenerative joint disease), cervical 06/08/2016  . DDD (degenerative disc disease), lumbar 06/08/2016  . Abnormal serum angiotensin-converting enzyme level 06/08/2016  . History of partial knee replacement 05/31/2016  . Allergic rhinitis   . Chronic prescription benzodiazepine use 01/06/2014  . Acute upper respiratory infections of unspecified site 10/05/2013  . Persistent cough for 3 weeks or longer 10/05/2013  . 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 Abdominal bruit 12/31/2012  . Arthritis, rheumatoid (HCC) 12/31/2012  . Encounter for Medicare annual wellness exam 12/31/2012  . Degenerative joint disease of  spine multilevel 03/07/2012  . Spinal stenosis, lumbar 03/07/2012  . Hip pain 09/02/2011  . Sciatica of left side 08/14/2011  . Spinal stenosis of lumbar region 08/14/2011  . Encounter for long-term (current) use of other medications 06/25/2011  . High risk medication use 12/21/2010  . Need for lipid  screening 12/21/2010  . Foot pain, left 07/22/2010  . CHEST PAIN 02/18/2010  . Osteopenia 07/05/2009  . TREMOR, RIGHT HAND 01/26/2009  . BACK PAIN 09/30/2008  . NEOPLASM, SKIN, UNCERTAIN BEHAVIOR 05/27/2008  . HYPERPOTASSEMIA 05/27/2008  . Essential hypertension 10/12/2007  . TRANSIENT GLOBAL AMNESIA 10/12/2007  . VARICOSE VEINS, LOWER EXTREMITIES 06/06/2007  . SYSTOLIC MURMUR 06/06/2007  . ABDOMINAL BRUIT 06/06/2007  . ALLERGIC RHINITIS 08/17/2006  . GERD 08/17/2006  . Osteoarthritis 08/17/2006  . Disturbance in sleep behavior 08/17/2006    Past Medical History:  Diagnosis Date  . Abdominal pain, other specified site   . Abnormal LFTs   . Allergic rhinitis   . Cervical back pain with evidence of disc disease   . GERD (gastroesophageal reflux disease)   . Heart murmur    says all her life had echo in [place] year ago not to worry  . Hypertension   . Nerve damage    left arm  . Osteoarthritis   . Osteopenia   . Phlebitis   . Spinal stenosis   . Transient global amnesia     Family History  Problem Relation Age of Onset  . Hypertension Father   . Alcohol abuse Father   . Lung cancer Father   . Cancer Maternal Grandmother        sinus  . COPD Mother   . Osteoporosis Mother   . Heart disease Mother   . Hypertension Brother   . Heart Problems Brother   . Arthritis Brother   . Prostate cancer Maternal Grandfather   . Heart attack Brother   . Hypertension Brother   . Healthy Daughter   . Healthy Son   . Colon cancer Neg Hx   . Pancreatic cancer Neg Hx   . Stomach cancer Neg Hx    Past Surgical History:  Procedure Laterality Date  . BUNIONECTOMY     fall 2022 [facility]  . CHOLECYSTECTOMY    . CYST EXCISION Right 01/2018   right thumb   . FOOT SURGERY      x 2    . HAND SURGERY     right hand  . KNEE ARTHROSCOPY     right  . REPLACEMENT UNICONDYLAR JOINT KNEE     rt knee  . TONSILLECTOMY    . 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  [place] grand child    Does water exercise aerobics walking elliptical     Objective: Vital Signs: BP 132/65 (BP Location: Left Arm, Patient Position: Sitting, Cuff Size: Normal)   Pulse 71   Resp 12   Ht 5\' 1"  (1.549 m)   Wt 134 lb 3.2 oz (60.9 kg)   BMI 25.36 kg/m    Physical Exam Vitals signs and nursing note reviewed.  Constitutional:      Appearance: She is well-developed.  HENT:     Head: Normocephalic and atraumatic.  Eyes:     Conjunctiva/sclera: Conjunctivae normal.  Neck:  Musculoskeletal: Normal range of motion.  Cardiovascular:     Rate and Rhythm: Normal rate and regular rhythm.     Heart sounds: Normal heart sounds.  Pulmonary:     Effort: Pulmonary effort is normal.     Breath sounds: Normal breath sounds.  Abdominal:     General: Bowel sounds are normal.     Palpations: Abdomen is soft.  Lymphadenopathy:     Cervical: No cervical adenopathy.  Skin:    General: Skin is warm and dry.     Capillary Refill: Capillary refill takes less than 2 seconds.  Neurological:     Mental Status: She is alert and oriented to person, place, and time.  Psychiatric:        Behavior: Behavior normal.      Musculoskeletal Exam: C-spine, thoracic spine, and lumbar spine good ROM.  No midline spinal tenderness.  No SI joint tenderness.  Shoulder joints, elbow joints, wrist joints, MCPs, PIPs, and DIPs good ROM with no synovitis.  Right 5th PIP synovial thickening.  DIP thickening.  Hip joints, knee joints, ankle joints, MCPs, PIPs, and DIPs good ROM with no synovitis.  No warmth or effusion of knee joints.  No tenderness or swelling of ankle joints.  Tenderness of trochanteric bursa bilaterally.   CDAI Exam: CDAI Score: Not documented Patient Global Assessment: Not documented; Provider Global Assessment: Not documented Swollen: Not documented;  Tender: Not documented Joint Exam   Not documented   There is currently no information documented on the homunculus. Go to the Rheumatology activity and complete the homunculus joint exam.  Investigation: No additional findings.  Imaging: No results found.  Recent Labs: Lab Results  Component Value Date   WBC 6.4 10/25/2016   HGB 13.3 10/25/2016   PLT 232 10/25/2016   NA 141 05/02/2017   K 4.9 05/02/2017   CL 101 05/02/2017   CO2 27 05/02/2017   GLUCOSE 87 05/02/2017   BUN 28 (H) 05/02/2017   CREATININE 0.89 05/02/2017   BILITOT 0.3 05/02/2017   ALKPHOS 90 05/02/2017   AST 28 05/02/2017   ALT 22 05/02/2017   PROT 7.2 05/02/2017   ALBUMIN 4.8 05/02/2017   CALCIUM 10.2 05/02/2017   GFRAA 68 12/15/2016    Speciality Comments: No specialty comments available.  Procedures:  No procedures performed Allergies: Codeine phosphate   Assessment / Plan:     Visit Diagnoses: Primary osteoarthritis of both hands: She had DIP synovial thickening.  She has right 5th PIP joint synovial thickening and tenderness.  No synovitis noted.  She has complete fist formation bilaterally.  Joint protection and muscle strengthening were discussed.  She was advised to notify us if she develops increased joint pain or joint swelling.  She will follow up in 6 months.   Digital mucinous cyst: Dr. Melvyn Novas removed the cyst 1 week ago.  She will be following up with him next week for suture removal.  She has not noticed any drainage or signs of infection.  She is in no discomfort at this time.  Primary osteoarthritis of both feet: She has no discomfort in her feet at this time.   History of partial knee replacement - right: She has no warmth or effusion.  She has good ROM.   Trochanteric bursitis of both hips: She has intermittent trochanteric bursitis bilaterally.  She performs stretching exercises on a regular basis.  She continues to go to yoga regularly.   DDD (degenerative disc disease),  cervical -She has good ROM.  She has no symptoms of radiculopathy.  Her neck pain and stiffness as improved.  She takes tizanidine 4 mg p.o. nightly as needed I have advised her to use it only about 4-week.   DDD (degenerative disc disease), lumbar:  She has no midline spinal tenderness.    Other chronic pain - tramadol 50 mg 1 tablet BID PRN.  She does not need a refill at this time.  UDS and narcotic agreement were update today 01/31/18.   Osteopenia of multiple sites - Most recent DEXA on 02/11/15. She reports her PCP has ordered the scans in the past. She was advised to notify PCP that is due for her next DEXA.  She is taking a calcium supplement.   Medication monitoring encounter -UDS and narcotic agreement were updated today 01/31/18. Plan: Pain Mgmt, Profile 5 w/Conf, U, Pain Mgmt, Tramadol w/medMATCH, U   Other medical conditions are listed as follows:   History of gastroesophageal reflux (GERD)  History of hypertension  Elevated LFTs  History of bunionectomy  Abnormal serum angiotensin-converting enzyme level - + ACE negative CXR.  History of varicose veins    Orders: Orders Placed This Encounter  Procedures  . Pain Mgmt, Profile 5 w/Conf, U  . Pain Mgmt, Tramadol w/medMATCH, U   No orders of the defined types were placed in this encounter.     Follow-Up Instructions: Return in about 6 months (around 08/02/2018) for Osteoarthritis, DDD.   Gearldine Bienenstock, PA-C   I examined and evaluated the patient with Sherron Ales PA.  Patient continues to have some joint stiffness and discomfort.  She had recent surgery for mucinous cyst.  She had DIP and PIP thickening in her hands on my examination today.  She takes tramadol on PRN basis.  The plan of care was discussed as noted above.  Pollyann Savoy, MD  Note - This record has been created using Animal nutritionist.  Chart creation errors have been sought, but may not always  have been located. Such creation errors do not  reflect on  the standard of medical care.

## 2018-01-31 ENCOUNTER — Encounter: Payer: Self-pay | Admitting: Rheumatology

## 2018-01-31 ENCOUNTER — Ambulatory Visit (INDEPENDENT_AMBULATORY_CARE_PROVIDER_SITE_OTHER): Payer: Medicare Other | Admitting: Rheumatology

## 2018-01-31 VITALS — BP 132/65 | HR 71 | Resp 12 | Ht 61.0 in | Wt 134.2 lb

## 2018-01-31 DIAGNOSIS — M19071 Primary osteoarthritis, right ankle and foot: Secondary | ICD-10-CM | POA: Diagnosis not present

## 2018-01-31 DIAGNOSIS — R7989 Other specified abnormal findings of blood chemistry: Secondary | ICD-10-CM

## 2018-01-31 DIAGNOSIS — M19072 Primary osteoarthritis, left ankle and foot: Secondary | ICD-10-CM

## 2018-01-31 DIAGNOSIS — G8929 Other chronic pain: Secondary | ICD-10-CM

## 2018-01-31 DIAGNOSIS — M7061 Trochanteric bursitis, right hip: Secondary | ICD-10-CM

## 2018-01-31 DIAGNOSIS — Z96659 Presence of unspecified artificial knee joint: Secondary | ICD-10-CM | POA: Diagnosis not present

## 2018-01-31 DIAGNOSIS — M19041 Primary osteoarthritis, right hand: Secondary | ICD-10-CM

## 2018-01-31 DIAGNOSIS — M5030 Other cervical disc degeneration, unspecified cervical region: Secondary | ICD-10-CM

## 2018-01-31 DIAGNOSIS — M7062 Trochanteric bursitis, left hip: Secondary | ICD-10-CM

## 2018-01-31 DIAGNOSIS — M67449 Ganglion, unspecified hand: Secondary | ICD-10-CM | POA: Diagnosis not present

## 2018-01-31 DIAGNOSIS — M19042 Primary osteoarthritis, left hand: Secondary | ICD-10-CM

## 2018-01-31 DIAGNOSIS — Z8719 Personal history of other diseases of the digestive system: Secondary | ICD-10-CM

## 2018-01-31 DIAGNOSIS — M8589 Other specified disorders of bone density and structure, multiple sites: Secondary | ICD-10-CM

## 2018-01-31 DIAGNOSIS — R945 Abnormal results of liver function studies: Secondary | ICD-10-CM

## 2018-01-31 DIAGNOSIS — Z9889 Other specified postprocedural states: Secondary | ICD-10-CM

## 2018-01-31 DIAGNOSIS — Z5181 Encounter for therapeutic drug level monitoring: Secondary | ICD-10-CM

## 2018-01-31 DIAGNOSIS — Z8679 Personal history of other diseases of the circulatory system: Secondary | ICD-10-CM

## 2018-01-31 DIAGNOSIS — M5136 Other intervertebral disc degeneration, lumbar region: Secondary | ICD-10-CM

## 2018-01-31 DIAGNOSIS — R748 Abnormal levels of other serum enzymes: Secondary | ICD-10-CM

## 2018-02-02 LAB — PAIN MGMT, PROFILE 5 W/CONF, U
Amphetamines: NEGATIVE ng/mL (ref <500)
BENZODIAZEPINES: NEGATIVE ng/mL (ref <100)
Barbiturates: NEGATIVE ng/mL (ref <300)
Cocaine Metabolite: NEGATIVE ng/mL (ref <150)
Creatinine: 102.9 mg/dL
METHADONE METABOLITE: NEGATIVE ng/mL (ref <100)
Marijuana Metabolite: NEGATIVE ng/mL (ref <20)
OPIATES: NEGATIVE ng/mL (ref <100)
OXYCODONE: NEGATIVE ng/mL (ref <100)
Oxidant: NEGATIVE ug/mL (ref <200)
pH: 6.61 (ref 4.5–9.0)

## 2018-02-02 LAB — PAIN MGMT, TRAMADOL W/MEDMATCH, U
DESMETHYLTRAMADOL: 508 ng/mL — AB (ref <100)
Tramadol: 1573 ng/mL — ABNORMAL HIGH (ref <100)

## 2018-02-05 NOTE — Progress Notes (Signed)
UDS is consistent with treatment.

## 2018-02-13 ENCOUNTER — Other Ambulatory Visit: Payer: Self-pay | Admitting: Rheumatology

## 2018-02-13 NOTE — Telephone Encounter (Signed)
Last visit: 01/31/18 Next visit: 08/02/18  Okay to refill per Dr. Deveshwar  

## 2018-03-12 ENCOUNTER — Other Ambulatory Visit: Payer: Self-pay | Admitting: Internal Medicine

## 2018-03-16 ENCOUNTER — Other Ambulatory Visit: Payer: Self-pay | Admitting: Rheumatology

## 2018-03-19 NOTE — Telephone Encounter (Signed)
Last visit: 01/31/18 Next visit: 08/02/18  Okay to refill per Dr. Deveshwar  

## 2018-03-22 ENCOUNTER — Other Ambulatory Visit: Payer: Self-pay | Admitting: Rheumatology

## 2018-03-22 MED ORDER — TRAMADOL HCL 50 MG PO TABS: ORAL_TABLET | ORAL | 0 refills | Status: DC

## 2018-03-22 NOTE — Telephone Encounter (Signed)
ok 

## 2018-03-22 NOTE — Telephone Encounter (Signed)
Patient requesting a refill on Tramadol sent to [facility] on [address]. Patient going away tomorrow, and would like a refill if possible before she leaves.

## 2018-03-22 NOTE — Telephone Encounter (Signed)
Last visit: 01/31/18 Next visit:08/02/18 UDS: 01/31/18 Narc Agreement: 01/31/18  Okay to refill Tramadol?  

## 2018-04-14 ENCOUNTER — Other Ambulatory Visit: Payer: Self-pay | Admitting: Rheumatology

## 2018-04-16 NOTE — Telephone Encounter (Signed)
Last visit: 01/31/18 Next visit: 08/02/18  Okay to refill per Dr. Deveshwar  

## 2018-05-01 ENCOUNTER — Other Ambulatory Visit: Payer: Self-pay | Admitting: Internal Medicine

## 2018-05-04 ENCOUNTER — Ambulatory Visit: Payer: Medicare Other

## 2018-05-08 ENCOUNTER — Telehealth: Payer: Self-pay

## 2018-05-08 NOTE — Telephone Encounter (Signed)
Author phoned pt. To offer virtual awv. Pt. Stated she would be open to doing virtually, but could not reschedule at this time as she was driving. Pt. Stated she would call back to schedule.

## 2018-05-12 ENCOUNTER — Other Ambulatory Visit: Payer: Self-pay | Admitting: Rheumatology

## 2018-05-14 NOTE — Telephone Encounter (Signed)
Last visit: 01/31/18 Next visit: 08/02/18  Okay to refill per Dr. Deveshwar  

## 2018-05-16 NOTE — Telephone Encounter (Signed)
Author phoned pt. To offer to do virtual awv with PCP. No answer, author left detailed VM asking for return call if interested in scheduling virtual awv with PCP.

## 2018-05-30 ENCOUNTER — Ambulatory Visit: Payer: Medicare Other

## 2018-06-05 ENCOUNTER — Other Ambulatory Visit: Payer: Self-pay

## 2018-06-05 ENCOUNTER — Ambulatory Visit (INDEPENDENT_AMBULATORY_CARE_PROVIDER_SITE_OTHER): Payer: Medicare Other | Admitting: Internal Medicine

## 2018-06-05 ENCOUNTER — Encounter: Payer: Self-pay | Admitting: Internal Medicine

## 2018-06-05 DIAGNOSIS — Z79899 Other long term (current) drug therapy: Secondary | ICD-10-CM

## 2018-06-05 DIAGNOSIS — F4322 Adjustment disorder with anxiety: Secondary | ICD-10-CM | POA: Diagnosis not present

## 2018-06-05 MED ORDER — LORAZEPAM 1 MG PO TABS: 0.5000 mg | ORAL_TABLET | Freq: Two times a day (BID) | ORAL | 1 refills | Status: DC | PRN

## 2018-06-05 NOTE — Progress Notes (Signed)
Virtual Visit via Video Note  I connected with@ on 06/05/18 at  2:45 PM EDT by a video enabled telemedicine application and verified that I am speaking with the correct person using two identifiers. Location patient: home Location provider:work or home office Persons participating in the virtual visit: patient, provider  [place] national recommendations  regarding COVID 19 pandemic   video visit is advised over in office visit for this patient.  Discussed the limitations of evaluation and management by telemedicine and  availability of in person appointments. The patient expressed understanding and agreed to proceed.   HPI: Briana Giles presents for video visit because of increasing stress and anxiety over the illness and care needed for her older cat ( will need a feeding tube for med? ) as has lymphoma and renal insuff.   Also some with covid . Walking well no new medical changes  Sleep some disruption recently.  etoh 1- 1.5 per night  Not taking tramadol dialy ocass for knee  When flares   ROS: See pertinent positives and negatives per HPI. No fall cp sob   Past Medical History:  Diagnosis Date  . Abdominal pain, other specified site   . Abnormal LFTs   . Allergic rhinitis   . Cervical back pain with evidence of disc disease   . GERD (gastroesophageal reflux disease)   . Heart murmur    says all her life had echo in [place] year ago not to worry  . Hypertension   . Nerve damage    left arm  . Osteoarthritis   . Osteopenia   . Phlebitis   . Spinal stenosis   . Transient global amnesia     Past Surgical History:  Procedure Laterality Date  . BUNIONECTOMY     fall 2022 [facility]  . CHOLECYSTECTOMY    . CYST EXCISION Right 01/2018   right thumb   . FOOT SURGERY      x 2    . HAND SURGERY     right hand  . KNEE ARTHROSCOPY     right  . REPLACEMENT UNICONDYLAR JOINT KNEE     rt knee  . TONSILLECTOMY    . TOTAL KNEE ARTHROPLASTY     right    Family History   Problem Relation Age of Onset  . Hypertension Father   . Alcohol abuse Father   . Lung cancer Father   . Cancer Maternal Grandmother        sinus  . COPD Mother   . Osteoporosis Mother   . Heart disease Mother   . Hypertension Brother   . Heart Problems Brother   . Arthritis Brother   . Prostate cancer Maternal Grandfather   . Heart attack Brother   . Hypertension Brother   . Healthy Daughter   . Healthy Son   . Colon cancer Neg Hx   . Pancreatic cancer Neg Hx   . Stomach cancer Neg Hx     Social History   Tobacco Use  . Smoking status: Never Smoker  . Smokeless tobacco: Never Used  Substance Use Topics  . Alcohol use: Yes    Comment: occasional   . Drug use: Never      Current Outpatient Medications:  .  acetaminophen (TYLENOL) 500 MG tablet, Take 1,000 mg by mouth every 6 (six) hours as needed for mild pain, moderate pain or headache. , Disp: , Rfl:  .  calcium carbonate (OS-CAL - DOSED IN MG OF ELEMENTAL CALCIUM) 1250 (500  CA) MG tablet, Take 1,000 mg by mouth daily., Disp: , Rfl:  .  CHERRY SYRUP PO, Take by mouth daily. , Disp: , Rfl:  .  fluticasone (FLONASE) 50 MCG/ACT nasal spray, USE TWO SPRAYS IN EACH NOSTRIL ONCE DAILY. (Patient taking differently: USE TWO SPRAYS IN EACH NOSTRIL ONCE DAILY AS NEEDED FOR ALLERGIES), Disp: 16 g, Rfl: 5 .  Ginger, Zingiber officinalis, (GINGER ROOT PO), Take by mouth daily., Disp: , Rfl:  .  ibuprofen (ADVIL,MOTRIN) 200 MG tablet, Take 200 mg by mouth as needed., Disp: , Rfl:  .  lidocaine (LIDODERM) 5 %, Place 1 patch onto the skin daily as needed (pain). Reported on 03/30/2015, Disp: , Rfl:  .  lisinopril (PRINIVIL,ZESTRIL) 20 MG tablet, TAKE 1 TABLET BY MOUTH EVERY DAY, Disp: 90 tablet, Rfl: 0 .  LORazepam (ATIVAN) 1 MG tablet, Take 0.5-1 tablets (0.5-1 mg total) by mouth 2 (two) times daily as needed for anxiety. Do not take with alcohol., Disp: 24 tablet, Rfl: 1 .  methocarbamol (ROBAXIN) 500 MG tablet, Take 1 tablet (500 mg  total) by mouth daily. (Patient taking differently: Take 500 mg by mouth as needed. ), Disp: 30 tablet, Rfl: 1 .  Omega-3 Fatty Acids (OMEGA 3 500 PO), Take by mouth daily., Disp: , Rfl:  .  polyethylene glycol powder (MIRALAX) powder, Take 17 g by mouth once. (Patient taking differently: Take 17 g by mouth as needed. ), Disp: 255 g, Rfl: 0 .  tiZANidine (ZANAFLEX) 4 MG tablet, TAKE 1 TABLET BY MOUTH EVERY NIGHT AT BEDTIME, Disp: 30 tablet, Rfl: 0 .  traMADol (ULTRAM) 50 MG tablet, TAKE 1 TABLET BY MOUTH TWICE DAILY AS NEEDED, Disp: 60 tablet, Rfl: 0 .  TURMERIC PO, Take 1 tablet by mouth daily. , Disp: , Rfl:   EXAM: BP Readings from Last 3 Encounters:  01/31/18 132/65  11/15/17 135/66  10/25/17 122/64    VITALS per patient if applicable:  GENERAL: alert, oriented, appears well and in no acute distress  HEENT: atraumatic, conjunttiva clear, no obvious abnormalities on inspection of external nose and ears  NECK: normal movements of the head and neck  LUNGS: on inspection no signs of respiratory distress, breathing rate appears normal, no obvious gross SOB, gasping or wheezing  CV: no obvious cyanosis    PSYCH/NEURO: pleasant and cooperative, no obvious depression or anxiety, speech and thought processing grossly intact   ASSESSMENT AND PLAN:  Discussed the following assessment and plan:  Adjustment reaction with anxiety  Medication management Anxiety   Disc options   Cont healthy life style    Can try short term benzos with  Caution cns  Avoid etoh with this  And contact us in 2-3 weeks and consider  Daily  controller   Med if needed at that time   counseling difficult under covid  Distancing  But support as possible  Counseled.   Expectant management and discussion of plan and treatment with patient with opportunity to ask questions and all were answered. The patient agreed with the plan and demonstrated an understanding of the instructions.  at risk meds  The patient was  advised to call back or seek an in-person evaluation if worsening  or having concerns .  I provided 64minutes of non-face-to-face time during this encounter.   Shanon Ace, MD

## 2018-06-08 ENCOUNTER — Other Ambulatory Visit: Payer: Self-pay | Admitting: Rheumatology

## 2018-06-08 NOTE — Telephone Encounter (Signed)
Last visit: 01/31/18 Next visit:08/02/18  Okay to refill per Dr. Estanislado Pandy

## 2018-07-01 ENCOUNTER — Other Ambulatory Visit: Payer: Self-pay | Admitting: Rheumatology

## 2018-07-02 NOTE — Telephone Encounter (Signed)
ok 

## 2018-07-02 NOTE — Telephone Encounter (Signed)
Last visit: 01/31/18 Next visit:08/02/18 UDS: 01/31/18 Narc Agreement: 01/31/18  Okay to refill Tramadol?

## 2018-07-10 ENCOUNTER — Other Ambulatory Visit: Payer: Self-pay | Admitting: Rheumatology

## 2018-07-10 NOTE — Telephone Encounter (Signed)
Last visit: 01/31/18 Next visit:08/02/18  Okay to refill per Dr. Estanislado Pandy

## 2018-07-19 NOTE — Progress Notes (Deleted)
Office Visit Note  Patient: Briana Giles             Date of Birth: [birth year]           MRN: [id number]             PCP: Madelin Headings, MD Referring: Madelin Headings, MD Visit Date: 08/02/2018 Occupation: @GUAROCC @  Subjective:  No chief complaint on file.   History of Present Illness: Briana Giles is a 73 y.o. female ***   Activities of Daily Living:  Patient reports morning stiffness for *** {minute/hour:19697}.   Patient {ACTIONS;DENIES/REPORTS:21021675::"Denies"} nocturnal pain.  Difficulty dressing/grooming: {ACTIONS;DENIES/REPORTS:21021675::"Denies"} Difficulty climbing stairs: {ACTIONS;DENIES/REPORTS:21021675::"Denies"} Difficulty getting out of chair: {ACTIONS;DENIES/REPORTS:21021675::"Denies"} Difficulty using hands for taps, buttons, cutlery, and/or writing: {ACTIONS;DENIES/REPORTS:21021675::"Denies"}  No Rheumatology ROS completed.   PMFS History:  Patient Active Problem List   Diagnosis Date Noted  . DJD (degenerative joint disease), cervical 06/08/2016  . DDD (degenerative disc disease), lumbar 06/08/2016  . Abnormal serum angiotensin-converting enzyme level 06/08/2016  . History of partial knee replacement 05/31/2016  . Allergic rhinitis   . Chronic prescription benzodiazepine use 01/06/2014  . Acute upper respiratory infections of unspecified site 10/05/2013  . Persistent cough for 3 weeks or longer 10/05/2013  . 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 Abdominal bruit 12/31/2012  . Arthritis, rheumatoid (HCC) 12/31/2012  . Encounter for Medicare annual wellness exam 12/31/2012  . Degenerative joint disease of spine multilevel 03/07/2012  . Spinal stenosis, lumbar 03/07/2012  . Hip pain 09/02/2011  . Sciatica of left side 08/14/2011  . Spinal stenosis of lumbar region 08/14/2011  . Encounter for long-term (current) use of other medications 06/25/2011  . High risk medication use 12/21/2010  . Need for lipid  screening 12/21/2010  . Foot pain, left 07/22/2010  . CHEST PAIN 02/18/2010  . Osteopenia 07/05/2009  . TREMOR, RIGHT HAND 01/26/2009  . BACK PAIN 09/30/2008  . NEOPLASM, SKIN, UNCERTAIN BEHAVIOR 05/27/2008  . HYPERPOTASSEMIA 05/27/2008  . Essential hypertension 10/12/2007  . TRANSIENT GLOBAL AMNESIA 10/12/2007  . VARICOSE VEINS, LOWER EXTREMITIES 06/06/2007  . SYSTOLIC MURMUR 06/06/2007  . ABDOMINAL BRUIT 06/06/2007  . ALLERGIC RHINITIS 08/17/2006  . GERD 08/17/2006  . Osteoarthritis 08/17/2006  . Disturbance in sleep behavior 08/17/2006    Past Medical History:  Diagnosis Date  . Abdominal pain, other specified site   . Abnormal LFTs   . Allergic rhinitis   . Cervical back pain with evidence of disc disease   . GERD (gastroesophageal reflux disease)   . Heart murmur    says all her life had echo in [place] year ago not to worry  . Hypertension   . Nerve damage    left arm  . Osteoarthritis   . Osteopenia   . Phlebitis   . Spinal stenosis   . Transient global amnesia     Family History  Problem Relation Age of Onset  . Hypertension Father   . Alcohol abuse Father   . Lung cancer Father   . Cancer Maternal Grandmother        sinus  . COPD Mother   . Osteoporosis Mother   . Heart disease Mother   . Hypertension Brother   . Heart Problems Brother   . Arthritis Brother   . Prostate cancer Maternal Grandfather   . Heart attack Brother   . Hypertension Brother   . Healthy Daughter   . Healthy Son   . Colon cancer Neg  Hx   . Pancreatic cancer Neg Hx   . Stomach cancer Neg Hx    Past Surgical History:  Procedure Laterality Date  . BUNIONECTOMY     fall 2022 [facility]  . CHOLECYSTECTOMY    . CYST EXCISION Right 01/2018   right thumb   . FOOT SURGERY      x 2    . HAND SURGERY     right hand  . KNEE ARTHROSCOPY     right  . REPLACEMENT UNICONDYLAR JOINT KNEE     rt knee  . TONSILLECTOMY    . 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  [place] grand child    Does water exercise aerobics walking elliptical    Immunization History  Administered Date(s) Administered  . Influenza Split 12/27/2011, 10/15/2012  . Influenza Whole 12/03/2007, 12/24/2009, 10/25/2010  . Influenza, High Dose Seasonal PF 12/31/2013, 11/06/2014, 12/10/2016, 10/25/2017  . Influenza-Unspecified 11/14/2016  . Pneumococcal Conjugate-13 01/06/2014  . Pneumococcal Polysaccharide-23 12/21/2010  . Td 05/27/2008  . Tdap 01/06/2014  . Zoster 12/03/2007     Objective: Vital Signs: There were no vitals taken for this visit.   Physical Exam   Musculoskeletal Exam: ***  CDAI Exam: CDAI Score: Not documented Patient Global Assessment: Not documented; Provider Global Assessment: Not documented Swollen: Not documented; Tender: Not documented Joint Exam   Not documented   There is currently no information documented on the homunculus. Go to the Rheumatology activity and complete the homunculus joint exam.  Investigation: No additional findings.  Imaging: No results found.  Recent Labs: Lab Results  Component Value Date   WBC 6.4 10/25/2016   HGB 13.3 10/25/2016   PLT 232 10/25/2016   NA 141 05/02/2017   K 4.9 05/02/2017   CL 101 05/02/2017   CO2 27 05/02/2017   GLUCOSE 87 05/02/2017   BUN 28 (H) 05/02/2017   CREATININE 0.89 05/02/2017   BILITOT 0.3 05/02/2017   ALKPHOS 90 05/02/2017   AST 28 05/02/2017   ALT 22 05/02/2017   PROT 7.2 05/02/2017   ALBUMIN 4.8 05/02/2017   CALCIUM 10.2 05/02/2017   GFRAA 68 12/15/2016    Speciality Comments: No specialty comments available.  Procedures:  No procedures performed Allergies: Codeine phosphate   Assessment / Plan:     Visit Diagnoses: No diagnosis found.   Orders: No orders of the defined types were placed in this encounter.  No orders of the  defined types were placed in this encounter.   Face-to-face time spent with patient was *** minutes. Greater than 50% of time was spent in counseling and coordination of care.  Follow-Up Instructions: No follow-ups on file.   Ellen Henri, CMA  Note - This record has been created using Animal nutritionist.  Chart creation errors have been sought, but may not always  have been located. Such creation errors do not reflect on  the standard of medical care.

## 2018-07-28 ENCOUNTER — Other Ambulatory Visit: Payer: Self-pay | Admitting: Internal Medicine

## 2018-08-02 ENCOUNTER — Ambulatory Visit: Payer: Self-pay | Admitting: Rheumatology

## 2018-08-02 NOTE — Progress Notes (Deleted)
Office Visit Note  Patient: Briana Giles             Date of Birth: 08-Aug-1945           MRN: [id number]             PCP: Madelin Headings, MD Referring: Madelin Headings, MD Visit Date: 08/08/2018 Occupation: @GUAROCC @  Subjective:  No chief complaint on file.   Osteopenia is managed by Dr. Fabian Sharp. She is taking calcium and vitamin D supplementation. Last DEXA on 02/11/2015.  Due for repeat DEXA.  History of Present Illness: Briana Giles is a 73 y.o. female ***   Activities of Daily Living:  Patient reports morning stiffness for *** {minute/hour:19697}.   Patient {ACTIONS;DENIES/REPORTS:21021675::"Denies"} nocturnal pain.  Difficulty dressing/grooming: {ACTIONS;DENIES/REPORTS:21021675::"Denies"} Difficulty climbing stairs: {ACTIONS;DENIES/REPORTS:21021675::"Denies"} Difficulty getting out of chair: {ACTIONS;DENIES/REPORTS:21021675::"Denies"} Difficulty using hands for taps, buttons, cutlery, and/or writing: {ACTIONS;DENIES/REPORTS:21021675::"Denies"}  No Rheumatology ROS completed.   PMFS History:  Patient Active Problem List   Diagnosis Date Noted  . DJD (degenerative joint disease), cervical 06/08/2016  . DDD (degenerative disc disease), lumbar 06/08/2016  . Abnormal serum angiotensin-converting enzyme level 06/08/2016  . History of partial knee replacement 05/31/2016  . Allergic rhinitis   . Chronic prescription benzodiazepine use 01/06/2014  . Acute upper respiratory infections of unspecified site 10/05/2013  . Persistent cough for 3 weeks or longer 10/05/2013  . 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 Abdominal bruit 12/31/2012  . Arthritis, rheumatoid (HCC) 12/31/2012  . Encounter for Medicare annual wellness exam 12/31/2012  . Degenerative joint disease of spine multilevel 03/07/2012  . Spinal stenosis, lumbar 03/07/2012  . Hip pain 09/02/2011  . Sciatica of left side 08/14/2011  . Spinal stenosis of lumbar region  08/14/2011  . Encounter for long-term (current) use of other medications 06/25/2011  . High risk medication use 12/21/2010  . Need for lipid screening 12/21/2010  . Foot pain, left 07/22/2010  . CHEST PAIN 02/18/2010  . Osteopenia 07/05/2009  . TREMOR, RIGHT HAND 01/26/2009  . BACK PAIN 09/30/2008  . NEOPLASM, SKIN, UNCERTAIN BEHAVIOR 05/27/2008  . HYPERPOTASSEMIA 05/27/2008  . Essential hypertension 10/12/2007  . TRANSIENT GLOBAL AMNESIA 10/12/2007  . VARICOSE VEINS, LOWER EXTREMITIES 06/06/2007  . SYSTOLIC MURMUR 06/06/2007  . ABDOMINAL BRUIT 06/06/2007  . ALLERGIC RHINITIS 08/17/2006  . GERD 08/17/2006  . Osteoarthritis 08/17/2006  . Disturbance in sleep behavior 08/17/2006    Past Medical History:  Diagnosis Date  . Abdominal pain, other specified site   . Abnormal LFTs   . Allergic rhinitis   . Cervical back pain with evidence of disc disease   . GERD (gastroesophageal reflux disease)   . Heart murmur    says all her life had echo in [place] year ago not to worry  . Hypertension   . Nerve damage    left arm  . Osteoarthritis   . Osteopenia   . Phlebitis   . Spinal stenosis   . Transient global amnesia     Family History  Problem Relation Age of Onset  . Hypertension Father   . Alcohol abuse Father   . Lung cancer Father   . Cancer Maternal Grandmother        sinus  . COPD Mother   . Osteoporosis Mother   . Heart disease Mother   . Hypertension Brother   . Heart Problems Brother   . Arthritis Brother   . Prostate cancer Maternal Grandfather   .  Heart attack Brother   . Hypertension Brother   . Healthy Daughter   . Healthy Son   . Colon cancer Neg Hx   . Pancreatic cancer Neg Hx   . Stomach cancer Neg Hx    Past Surgical History:  Procedure Laterality Date  . BUNIONECTOMY     fall 2022 [facility]  . CHOLECYSTECTOMY    . CYST EXCISION Right 01/2018   right thumb   . FOOT SURGERY      x 2    . HAND SURGERY     right hand  . KNEE ARTHROSCOPY      right  . REPLACEMENT UNICONDYLAR JOINT KNEE     rt knee  . TONSILLECTOMY    . 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  [place] grand child    Does water exercise aerobics walking elliptical    Immunization History  Administered Date(s) Administered  . Influenza Split 12/27/2011, 10/15/2012  . Influenza Whole 12/03/2007, 12/24/2009, 10/25/2010  . Influenza, High Dose Seasonal PF 12/31/2013, 11/06/2014, 12/10/2016, 10/25/2017  . Influenza-Unspecified 11/14/2016  . Pneumococcal Conjugate-13 01/06/2014  . Pneumococcal Polysaccharide-23 12/21/2010  . Td 05/27/2008  . Tdap 01/06/2014  . Zoster 12/03/2007     Objective: Vital Signs: There were no vitals taken for this visit.   Physical Exam   Musculoskeletal Exam: ***  CDAI Exam: CDAI Score: - Patient Global: -; Provider Global: - Swollen: -; Tender: - Joint Exam   No joint exam has been documented for this visit   There is currently no information documented on the homunculus. Go to the Rheumatology activity and complete the homunculus joint exam.  Investigation: No additional findings.  Imaging: No results found.  Recent Labs: Lab Results  Component Value Date   WBC 6.4 10/25/2016   HGB 13.3 10/25/2016   PLT 232 10/25/2016   NA 141 05/02/2017   K 4.9 05/02/2017   CL 101 05/02/2017   CO2 27 05/02/2017   GLUCOSE 87 05/02/2017   BUN 28 (H) 05/02/2017   CREATININE 0.89 05/02/2017   BILITOT 0.3 05/02/2017   ALKPHOS 90 05/02/2017   AST 28 05/02/2017   ALT 22 05/02/2017   PROT 7.2 05/02/2017   ALBUMIN 4.8 05/02/2017   CALCIUM 10.2 05/02/2017   GFRAA 68 12/15/2016    Speciality Comments: No specialty comments available.  Procedures:  No procedures performed Allergies: Codeine phosphate   Assessment / Plan:     Visit Diagnoses: No diagnosis found.    Orders: No orders of the defined types were placed in this encounter.  No orders of the defined types were placed in this encounter.   Face-to-face time spent with patient was *** minutes. Greater than 50% of time was spent in counseling and coordination of care.  Follow-Up Instructions: No follow-ups on file.   Gearldine Bienenstock, PA-C  Note - This record has been created using Dragon software.  Chart creation errors have been sought, but may not always  have been located. Such creation errors do not reflect on  the standard of medical care.

## 2018-08-07 ENCOUNTER — Encounter: Payer: Self-pay | Admitting: Rheumatology

## 2018-08-07 ENCOUNTER — Ambulatory Visit (INDEPENDENT_AMBULATORY_CARE_PROVIDER_SITE_OTHER): Payer: Medicare Other | Admitting: Rheumatology

## 2018-08-07 ENCOUNTER — Other Ambulatory Visit: Payer: Self-pay

## 2018-08-07 ENCOUNTER — Ambulatory Visit: Payer: Self-pay

## 2018-08-07 VITALS — BP 142/73 | HR 77 | Resp 13 | Ht 61.0 in | Wt 136.6 lb

## 2018-08-07 DIAGNOSIS — M19071 Primary osteoarthritis, right ankle and foot: Secondary | ICD-10-CM

## 2018-08-07 DIAGNOSIS — M5030 Other cervical disc degeneration, unspecified cervical region: Secondary | ICD-10-CM

## 2018-08-07 DIAGNOSIS — M8589 Other specified disorders of bone density and structure, multiple sites: Secondary | ICD-10-CM

## 2018-08-07 DIAGNOSIS — Z8679 Personal history of other diseases of the circulatory system: Secondary | ICD-10-CM

## 2018-08-07 DIAGNOSIS — Z96659 Presence of unspecified artificial knee joint: Secondary | ICD-10-CM

## 2018-08-07 DIAGNOSIS — M19042 Primary osteoarthritis, left hand: Secondary | ICD-10-CM

## 2018-08-07 DIAGNOSIS — M19041 Primary osteoarthritis, right hand: Secondary | ICD-10-CM | POA: Diagnosis not present

## 2018-08-07 DIAGNOSIS — R748 Abnormal levels of other serum enzymes: Secondary | ICD-10-CM

## 2018-08-07 DIAGNOSIS — Z8719 Personal history of other diseases of the digestive system: Secondary | ICD-10-CM

## 2018-08-07 DIAGNOSIS — Z9889 Other specified postprocedural states: Secondary | ICD-10-CM

## 2018-08-07 DIAGNOSIS — M79641 Pain in right hand: Secondary | ICD-10-CM | POA: Diagnosis not present

## 2018-08-07 DIAGNOSIS — M19072 Primary osteoarthritis, left ankle and foot: Secondary | ICD-10-CM

## 2018-08-07 DIAGNOSIS — G8929 Other chronic pain: Secondary | ICD-10-CM

## 2018-08-07 DIAGNOSIS — M5136 Other intervertebral disc degeneration, lumbar region: Secondary | ICD-10-CM

## 2018-08-07 DIAGNOSIS — Z5181 Encounter for therapeutic drug level monitoring: Secondary | ICD-10-CM

## 2018-08-07 DIAGNOSIS — M7061 Trochanteric bursitis, right hip: Secondary | ICD-10-CM

## 2018-08-07 DIAGNOSIS — M7062 Trochanteric bursitis, left hip: Secondary | ICD-10-CM

## 2018-08-07 NOTE — Progress Notes (Signed)
Osteopenia is managed by Dr. Regis Bill. She is taking calcium and vitamin D supplementation. Last DEXA on 02/11/2015.  Due for repeat DEXA.

## 2018-08-07 NOTE — Progress Notes (Signed)
Office Visit Note  Patient: Briana Giles             Date of Birth: Jan 29, 1946           MRN: [id number]             PCP: Madelin Headings, MD Referring: Madelin Headings, MD Visit Date: 08/07/2018 Occupation: @GUAROCC @  Subjective:  Pain in both hands.   History of Present Illness: Briana Giles is a 73 y.o. female with history of osteoarthritis and fibromyalgia syndrome.  She states she has been experiencing some discomfort in her right hand.  She also has noticed a knot in her left hand.  She has been experiencing pain and discomfort in her left knee joint.  MRI of her left knee joint done by Dr. Malena Catholic had the results are pending.  She continues to have some discomfort from fibromyalgia.  She takes tramadol on PRN basis.  She takes Tylenol on a regular basis.  She uses tizanidine at nighttime which is been helpful.  Activities of Daily Living:  Patient reports morning stiffness for 0 minutes.   Patient Reports nocturnal pain.  Difficulty dressing/grooming: Denies Difficulty climbing stairs: Reports Difficulty getting out of chair: Denies Difficulty using hands for taps, buttons, cutlery, and/or writing: Denies  Review of Systems  Constitutional: Negative for fatigue, night sweats, weight gain and weight loss.  HENT: Negative for mouth sores, trouble swallowing, trouble swallowing, mouth dryness and nose dryness.   Eyes: Positive for dryness. Negative for pain, redness, itching and visual disturbance.  Respiratory: Negative for cough, shortness of breath, wheezing and difficulty breathing.   Cardiovascular: Negative for chest pain, palpitations, hypertension, irregular heartbeat and swelling in legs/feet.  Gastrointestinal: Positive for constipation. Negative for blood in stool and diarrhea.  Endocrine: Negative for increased urination.  Genitourinary: Negative for painful urination, pelvic pain and vaginal dryness.  Musculoskeletal: Positive for arthralgias, joint pain and  joint swelling. Negative for myalgias, muscle weakness, morning stiffness, muscle tenderness and myalgias.  Skin: Negative for color change, rash, hair loss, redness, skin tightness, ulcers and sensitivity to sunlight.  Allergic/Immunologic: Negative for susceptible to infections.  Neurological: Negative for dizziness, headaches, memory loss, night sweats and weakness.  Hematological: Positive for bruising/bleeding tendency. Negative for swollen glands.  Psychiatric/Behavioral: Negative for depressed mood, confusion and sleep disturbance. The patient is not nervous/anxious.     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/22/2013   Abdominal bruit 12/31/2012   Arthritis, rheumatoid (HCC) 12/31/2012   Encounter for Medicare annual wellness exam 12/31/2012   Degenerative joint disease of spine multilevel 03/07/2012   Spinal stenosis, lumbar 03/07/2012   Hip pain 09/02/2011   Sciatica of left side 08/14/2011   Spinal stenosis of lumbar region 08/14/2011   Encounter for long-term (current) use of other medications 06/25/2011   High risk medication use 12/21/2010   Need for lipid screening 12/21/2010   Foot pain, left 07/22/2010   CHEST PAIN 02/18/2010   Osteopenia 07/05/2009   TREMOR, RIGHT HAND 01/26/2009  BACK PAIN 09/30/2008   NEOPLASM, SKIN, UNCERTAIN BEHAVIOR 05/27/2008   HYPERPOTASSEMIA 05/27/2008   Essential hypertension 10/12/2007   TRANSIENT GLOBAL  AMNESIA 10/12/2007   VARICOSE VEINS, LOWER EXTREMITIES 06/06/2007   SYSTOLIC MURMUR 06/06/2007   ABDOMINAL BRUIT 06/06/2007   ALLERGIC RHINITIS 08/17/2006   GERD 08/17/2006   Osteoarthritis 08/17/2006   Disturbance in sleep behavior 08/17/2006    Past Medical History:  Diagnosis Date   Abdominal pain, other specified site    Abnormal LFTs    Allergic rhinitis    Cervical back pain with evidence of disc disease    GERD (gastroesophageal reflux disease)    Heart murmur    says all her life had echo in [place] year ago not to worry   Hypertension    Nerve damage    left arm   Osteoarthritis    Osteopenia    Phlebitis    Spinal stenosis    Transient global amnesia     Family History  Problem Relation Age of Onset   Hypertension Father    Alcohol abuse Father    Lung cancer Father    Cancer Maternal Grandmother        sinus   COPD Mother    Osteoporosis Mother    Heart disease Mother    Hypertension Brother    Heart Problems Brother    Arthritis Brother    Prostate cancer Maternal Grandfather    Heart attack Brother    Hypertension Brother    Healthy Daughter    Healthy Son    Colon cancer Neg Hx    Pancreatic cancer Neg Hx    Stomach cancer Neg Hx    Past Surgical History:  Procedure Laterality Date   BUNIONECTOMY     fall 2022 [facility]   CHOLECYSTECTOMY     CYST EXCISION Right 01/2018   right thumb    FOOT SURGERY      x 2     HAND SURGERY     right hand   KNEE ARTHROSCOPY     right   REPLACEMENT UNICONDYLAR JOINT KNEE     rt knee   TONSILLECTOMY     TOTAL KNEE ARTHROPLASTY     right   Social History   Social History Narrative   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  [place] grand child    Does water exercise aerobics walking elliptical    Immunization History  Administered Date(s) Administered   Influenza Split 12/27/2011,  10/15/2012   Influenza Whole 12/03/2007, 12/24/2009, 10/25/2010   Influenza, High Dose Seasonal PF 12/31/2013, 11/06/2014, 12/10/2016, 10/25/2017   Influenza-Unspecified 11/14/2016   Pneumococcal Conjugate-13 01/06/2014   Pneumococcal Polysaccharide-23 12/21/2010   Td 05/27/2008   Tdap 01/06/2014   Zoster 12/03/2007     Objective: Vital Signs: BP (!) 142/73 (BP Location: Left Arm, Patient Position: Sitting, Cuff Size: Normal)    Pulse 77    Resp 13    Ht 5\' 1"  (1.549 m)    Wt 136 lb 9.6 oz (62 kg)    BMI 25.81 kg/m    Physical Exam Vitals signs and nursing note reviewed.  Constitutional:      Appearance: She is well-developed.  HENT:     Head: Normocephalic and atraumatic.  Eyes:     Conjunctiva/sclera: Conjunctivae normal.  Neck:     Musculoskeletal: Normal range of motion.  Cardiovascular:  Rate and Rhythm: Normal rate and regular rhythm.     Heart sounds: Normal heart sounds.  Pulmonary:     Effort: Pulmonary effort is normal.     Breath sounds: Normal breath sounds.  Abdominal:     General: Bowel sounds are normal.     Palpations: Abdomen is soft.  Lymphadenopathy:     Cervical: No cervical adenopathy.  Skin:    General: Skin is warm and dry.     Capillary Refill: Capillary refill takes less than 2 seconds.  Neurological:     Mental Status: She is alert and oriented to person, place, and time.  Psychiatric:        Behavior: Behavior normal.      Musculoskeletal Exam: C-spine thoracic and lumbar spine good range of motion.  Shoulder joints elbow joints with good range of motion.  She has some tenderness over right third MCP joint.  DIP and PIP thickening in bilateral hands was noted.  Hip joints were in good range of motion.  She is some discomfort range of motion of bilateral knee joints without any warmth swelling or effusion.  She has bilateral first MTP thickening and PIP and DIP thickening consistent with osteoarthritis.  CDAI Exam: CDAI Score:  -- Patient Global: --; Provider Global: -- Swollen: --; Tender: -- Joint Exam   No joint exam has been documented for this visit   There is currently no information documented on the homunculus. Go to the Rheumatology activity and complete the homunculus joint exam.  Investigation: No additional findings.  Imaging: No results found.  Recent Labs: Lab Results  Component Value Date   WBC 6.4 10/25/2016   HGB 13.3 10/25/2016   PLT 232 10/25/2016   NA 141 05/02/2017   K 4.9 05/02/2017   CL 101 05/02/2017   CO2 27 05/02/2017   GLUCOSE 87 05/02/2017   BUN 28 (H) 05/02/2017   CREATININE 0.89 05/02/2017   BILITOT 0.3 05/02/2017   ALKPHOS 90 05/02/2017   AST 28 05/02/2017   ALT 22 05/02/2017   PROT 7.2 05/02/2017   ALBUMIN 4.8 05/02/2017   CALCIUM 10.2 05/02/2017   GFRAA 68 12/15/2016    Speciality Comments: No specialty comments available.  Procedures:  No procedures performed Allergies: Codeine phosphate   Assessment / Plan:     Visit Diagnoses: Pain in right hand -patient states she has been having discomfort in her right hand over the third MCP joint.  She was tender on palpation.  I will obtain x-ray and labs today.  None of the other joints are swollen.  Plan: XR Hand 2 View Right, x-ray was consistent with osteoarthritis.  Mild third MCP narrowing was noted.  No erosive changes were noted.  Rheumatoid factor, Sedimentation rate, Cyclic citrul peptide antibody, IgG, 14-3-3 eta Protein, Uric acid, Angiotensin converting enzyme,   Primary osteoarthritis of both hands - Plan: Joint protection muscle strengthening was discussed.  Primary osteoarthritis of both feet - Plan: Proper fitting shoes were discussed.  She has overcrowding of toes.  Use of toe spacer was discussed.  History of partial knee replacement -followed up by Dr. Antony Odea.  Trochanteric bursitis of both hips -she has been doing IT band exercises.  DDD (degenerative disc disease), cervical -she is been  having stiffness.  DDD (degenerative disc disease), lumbar -she is good range of motion of her lumbar spine.  Other chronic pain - tramadol 50 mg 1 tablet BID PRN.U Plan: She will get UDS today.  Narcotic agreement was renewed  today.  Osteopenia of multiple sites -she is on calcium and vitamin D.  History of varicose veins   History of bunionectomy  History of hypertension - Plan: Her blood pressure is elevated.  Have advised her to monitor blood pressure closely.  History of gastroesophageal reflux (GERD)  Abnormal serum angiotensin-converting enzyme level - + ACE negative CXR.   Medication monitoring encounter - Plan: Pain Mgmt, Profile 5 w/Conf, U, Pain Mgmt, Tramadol w/medMATCH, U,   Orders: Orders Placed This Encounter  Procedures   XR Hand 2 View Right   Pain Mgmt, Profile 5 w/Conf, U   Pain Mgmt, Tramadol w/medMATCH, U   Rheumatoid factor   Sedimentation rate   Cyclic citrul peptide antibody, IgG   14-3-3 eta Protein   Uric acid   Angiotensin converting enzyme   No orders of the defined types were placed in this encounter.     Follow-Up Instructions: Return in about 6 months (around 02/06/2019) for Osteoarthritis, FMS.   Pollyann Savoy, MD  Note - This record has been created using Animal nutritionist.  Chart creation errors have been sought, but may not always  have been located. Such creation errors do not reflect on  the standard of medical care.

## 2018-08-08 ENCOUNTER — Other Ambulatory Visit: Payer: Self-pay | Admitting: *Deleted

## 2018-08-08 ENCOUNTER — Ambulatory Visit: Payer: Medicare Other | Admitting: Rheumatology

## 2018-08-08 MED ORDER — TIZANIDINE HCL 4 MG PO TABS: 4.0000 mg | ORAL_TABLET | Freq: Every day | ORAL | 0 refills | Status: DC

## 2018-08-08 NOTE — Telephone Encounter (Signed)
Refill request received via fax  Last Visit: 08/07/18 Next Visit: 01/31/19  Okay to refill per Dr.Deveshwar

## 2018-08-13 LAB — PAIN MGMT, PROFILE 5 W/CONF, U
Alphahydroxyalprazolam: NEGATIVE ng/mL
Alphahydroxymidazolam: NEGATIVE ng/mL
Alphahydroxytriazolam: NEGATIVE ng/mL
Aminoclonazepam: NEGATIVE ng/mL
Amphetamines: NEGATIVE ng/mL
Barbiturates: NEGATIVE ng/mL
Benzodiazepines: NEGATIVE ng/mL
Cocaine Metabolite: NEGATIVE ng/mL
Creatinine: 186.6 mg/dL
Hydroxyethylflurazepam: NEGATIVE ng/mL
Lorazepam: NEGATIVE ng/mL
Marijuana Metabolite: NEGATIVE ng/mL
Methadone Metabolite: NEGATIVE ng/mL
Nordiazepam: NEGATIVE ng/mL
Opiates: NEGATIVE ng/mL
Oxazepam: NEGATIVE ng/mL
Oxidant: NEGATIVE ug/mL
Oxycodone: NEGATIVE ng/mL
Temazepam: NEGATIVE ng/mL
pH: 7.2 (ref 4.5–9.0)

## 2018-08-13 LAB — RHEUMATOID FACTOR: Rheumatoid fact SerPl-aCnc: <14 IU/mL (ref <14)

## 2018-08-13 LAB — PAIN MGMT, TRAMADOL W/MEDMATCH, U
Desmethyltramadol: NEGATIVE ng/mL
Tramadol: NEGATIVE ng/mL

## 2018-08-13 LAB — SEDIMENTATION RATE: Sed Rate: 14 mm/h (ref 0–30)

## 2018-08-13 LAB — CYCLIC CITRUL PEPTIDE ANTIBODY, IGG: Cyclic Citrullin Peptide Ab: 159 UNITS — ABNORMAL HIGH

## 2018-08-13 LAB — 14-3-3 ETA PROTEIN: 14-3-3 eta Protein: 0.3 ng/mL — ABNORMAL HIGH (ref <0.2)

## 2018-08-13 LAB — URIC ACID: Uric Acid, Serum: 4.8 mg/dL (ref 2.5–7.0)

## 2018-08-13 LAB — ANGIOTENSIN CONVERTING ENZYME: Angiotensin-Converting Enzyme: 6 U/L — ABNORMAL LOW (ref 9–67)

## 2018-08-27 NOTE — Progress Notes (Signed)
Office Visit Note  Patient: Briana Giles             Date of Birth: 07-Apr-1945           MRN: [id number]             PCP: Madelin Headings, MD Referring: Madelin Headings, MD Visit Date: 09/10/2018 Occupation: @GUAROCC @  Subjective:  Pain in multiple joints.   History of Present Illness: Briana Giles is a 73 y.o. female with history of osteoarthritis.  She states that recently she has been having increased pain and discomfort in her left knee joint.  She states she was seen by Dr. Despina Hick, who did x-ray and MRI of her knee joint.  He diagnosed her with osteoarthritis and meniscal tear.  He also felt that her left trochanteric bursitis is related to meniscal tear.  He recommended left knee joint partial replacement.  She will be getting a second opinion prior to making final decision.  She had a right partial knee replacement 11 years ago and still has discomfort off and on.  She continues to have some discomfort in her hands and feet due to osteoarthritis.  She also has neck discomfort due to underlying disc disease.  She has been also having headaches related to degenerative disc disease.  Recently her lower back is not bothering her.  She has been taking Celebrex and tramadol since yesterday which is been helpful.  Activities of Daily Living:  Patient reports morning stiffness for 0 minute.   Patient Reports nocturnal pain.  Difficulty dressing/grooming: Denies Difficulty climbing stairs: Reports Difficulty getting out of chair: Reports Difficulty using hands for taps, buttons, cutlery, and/or writing: Denies  Review of Systems  Constitutional: Negative for fatigue, night sweats, weight gain and weight loss.  HENT: Negative for mouth sores, trouble swallowing, trouble swallowing, mouth dryness and nose dryness.   Eyes: Positive for dryness. Negative for pain, redness and visual disturbance.  Respiratory: Negative for cough, shortness of breath and difficulty breathing.    Cardiovascular: Negative for chest pain, palpitations, hypertension, irregular heartbeat and swelling in legs/feet.  Gastrointestinal: Negative for blood in stool, constipation and diarrhea.  Endocrine: Negative for increased urination.  Genitourinary: Negative for vaginal dryness.  Musculoskeletal: Positive for arthralgias, joint pain and morning stiffness. Negative for joint swelling, myalgias, muscle weakness, muscle tenderness and myalgias.  Skin: Negative for color change, rash, hair loss, skin tightness, ulcers and sensitivity to sunlight.  Allergic/Immunologic: Negative for susceptible to infections.  Neurological: Negative for dizziness, memory loss, night sweats and weakness.  Hematological: Negative for swollen glands.  Psychiatric/Behavioral: Negative for depressed mood and sleep disturbance. The patient is not nervous/anxious.     PMFS History:  Patient Active Problem List   Diagnosis Date Noted  . DJD (degenerative joint disease), cervical 06/08/2016  . DDD (degenerative disc disease), lumbar 06/08/2016  . Abnormal serum angiotensin-converting enzyme level 06/08/2016  . History of partial knee replacement 05/31/2016  . Allergic rhinitis   . Chronic prescription benzodiazepine use 01/06/2014  . Acute upper respiratory infections of unspecified site 10/05/2013  . Persistent cough for 3 weeks or longer 10/05/2013  . 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 Abdominal bruit 12/31/2012  . Encounter for Medicare annual wellness exam 12/31/2012  . Degenerative joint disease of spine multilevel 03/07/2012  . Spinal stenosis, lumbar 03/07/2012  . Hip pain 09/02/2011  . Sciatica of left side 08/14/2011  . Spinal stenosis of lumbar region  08/14/2011  . Encounter for long-term (current) use of other medications 06/25/2011  . High risk medication use 12/21/2010  . Need for lipid screening 12/21/2010  . Foot pain, left 07/22/2010  . CHEST PAIN  02/18/2010  . Osteopenia 07/05/2009  . TREMOR, RIGHT HAND 01/26/2009  . BACK PAIN 09/30/2008  . NEOPLASM, SKIN, UNCERTAIN BEHAVIOR 05/27/2008  . HYPERPOTASSEMIA 05/27/2008  . Essential hypertension 10/12/2007  . TRANSIENT GLOBAL AMNESIA 10/12/2007  . VARICOSE VEINS, LOWER EXTREMITIES 06/06/2007  . SYSTOLIC MURMUR 06/06/2007  . ABDOMINAL BRUIT 06/06/2007  . ALLERGIC RHINITIS 08/17/2006  . GERD 08/17/2006  . Osteoarthritis 08/17/2006  . Disturbance in sleep behavior 08/17/2006    Past Medical History:  Diagnosis Date  . Abdominal pain, other specified site   . Abnormal LFTs   . Allergic rhinitis   . Cervical back pain with evidence of disc disease   . GERD (gastroesophageal reflux disease)   . Heart murmur    says all her life had echo in [place] year ago not to worry  . Hypertension   . Nerve damage    left arm  . Osteoarthritis   . Osteopenia   . Phlebitis   . Spinal stenosis   . Transient global amnesia     Family History  Problem Relation Age of Onset  . Hypertension Father   . Alcohol abuse Father   . Lung cancer Father   . Cancer Maternal Grandmother        sinus  . COPD Mother   . Osteoporosis Mother   . Heart disease Mother   . Hypertension Brother   . Heart Problems Brother   . Arthritis Brother   . Prostate cancer Maternal Grandfather   . Heart attack Brother   . Hypertension Brother   . Healthy Daughter   . Healthy Son   . Colon cancer Neg Hx   . Pancreatic cancer Neg Hx   . Stomach cancer Neg Hx    Past Surgical History:  Procedure Laterality Date  . BUNIONECTOMY     fall 2022 [facility]  . CHOLECYSTECTOMY    . CYST EXCISION Right 01/2018   right thumb   . FOOT SURGERY      x 2    . HAND SURGERY     right hand  . KNEE ARTHROSCOPY     right  . REPLACEMENT UNICONDYLAR JOINT KNEE     rt knee  . TONSILLECTOMY    . 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  [place] grand child    Does water exercise aerobics walking elliptical    Immunization History  Administered Date(s) Administered  . Influenza Split 12/27/2011, 10/15/2012  . Influenza Whole 12/03/2007, 12/24/2009, 10/25/2010  . Influenza, High Dose Seasonal PF 12/31/2013, 11/06/2014, 12/10/2016, 10/25/2017  . Influenza-Unspecified 11/14/2016  . Pneumococcal Conjugate-13 01/06/2014  . Pneumococcal Polysaccharide-23 12/21/2010  . Td 05/27/2008  . Tdap 01/06/2014  . Zoster 12/03/2007     Objective: Vital Signs: BP 124/67 (BP Location: Left Arm, Patient Position: Sitting, Cuff Size: Normal)   Pulse 67   Resp 12   Ht 5\' 1"  (1.549 m)   Wt 137 lb (62.1 kg)   BMI 25.89 kg/m    Physical Exam Vitals signs and nursing note reviewed.  Constitutional:  Appearance: She is well-developed.  HENT:     Head: Normocephalic and atraumatic.  Eyes:     Conjunctiva/sclera: Conjunctivae normal.  Neck:     Musculoskeletal: Normal range of motion.  Cardiovascular:     Rate and Rhythm: Normal rate and regular rhythm.     Heart sounds: Normal heart sounds.  Pulmonary:     Effort: Pulmonary effort is normal.     Breath sounds: Normal breath sounds.  Abdominal:     General: Bowel sounds are normal.     Palpations: Abdomen is soft.  Lymphadenopathy:     Cervical: No cervical adenopathy.  Skin:    General: Skin is warm and dry.     Capillary Refill: Capillary refill takes less than 2 seconds.  Neurological:     Mental Status: She is alert and oriented to person, place, and time.  Psychiatric:        Behavior: Behavior normal.      Musculoskeletal Exam: She has limited range of motion of her cervical and lumbar spine with discomfort on range of motion of her cervical spine.  She also had bilateral trapezius spasm.  Shoulder joints, elbow joints with good range of motion.  She has bilateral PIP and DIP thickening.   Right third MCP thickening was noted but no synovitis was appreciated.  Hip joints were in good range of motion.  She has right knee joint partial replacement.  Left knee joint had no warmth swelling or effusion although she had discomfort range of motion.  Osteoarthritic changes were noted in her feet.  CDAI Exam: CDAI Score: - Patient Global: -; Provider Global: - Swollen: -; Tender: - Joint Exam   No joint exam has been documented for this visit   There is currently no information documented on the homunculus. Go to the Rheumatology activity and complete the homunculus joint exam.  Investigation: No additional findings.  Imaging: [place] Extrem Up Right Ltd  Result Date: 09/10/2018 Ultrasound examination of bilateral hands was performed per EULAR recommendations. Using 12 MHz transducer, grayscale and power Doppler right third MCP joint both dorsal and volar aspects were evaluated to look for synovitis or tenosynovitis. The findings were there was no synovitis or tenosynovitis on ultrasound examination. Impression: Ultrasound examination was negative for synovitis.   Recent Labs: Lab Results  Component Value Date   WBC 6.4 10/25/2016   HGB 13.3 10/25/2016   PLT 232 10/25/2016   NA 141 05/02/2017   K 4.9 05/02/2017   CL 101 05/02/2017   CO2 27 05/02/2017   GLUCOSE 87 05/02/2017   BUN 28 (H) 05/02/2017   CREATININE 0.89 05/02/2017   BILITOT 0.3 05/02/2017   ALKPHOS 90 05/02/2017   AST 28 05/02/2017   ALT 22 05/02/2017   PROT 7.2 05/02/2017   ALBUMIN 4.8 05/02/2017   CALCIUM 10.2 05/02/2017   GFRAA 68 12/15/2016  August 07, 1998 2014 3380.3+, anti-CCP 159+  Speciality Comments: No specialty comments available.  Procedures:  No procedures performed Allergies: Codeine phosphate   Assessment / Plan:     Visit Diagnoses: Primary osteoarthritis of both hands - Patient has right third MCP narrowing on the x-ray.  She is RF negative, anti-CCP positive, 14 3 3  eta positive.   Ultrasound examination of her right third MCP was obtained today which was negative for synovitis.  Form patient that in case she develops any swelling in the future she should notify me.  Pain in right hand - Plan: [place] Extrem Up Right Ltd, ultrasound  was negative for synovitis.  Primary osteoarthritis of both feet -joint protection was discussed.  Primary osteoarthritis of left knee -patient continues to have left knee joint pain and discomfort.  She was seen by Dr. Despina Hick and had MRI of her knee joint which was consistent with meniscal tear.  She will be going for second opinion.  She states she was advised partial knee replacement.  History of partial knee replacement - followed up by Dr. Despina Hick. -Patient states she continues to have chronic left knee discomfort.  Family history of rheumatoid arthritis - Mother   DDD (degenerative disc disease), cervical -she states she has been having increased neck discomfort and headaches related to neck pain.  I discussed possible use of TENS unit and referred her to physical therapy.  Plan: Ambulatory referral to Physical Therapy,   DDD (degenerative disc disease), lumbar -chronic pain.  Other chronic pain -she is on tramadol 50 mg p.o. twice daily for pain management.. UDS: 6/23/2020Narc agreement: 08/07/2018   Osteopenia of multiple sites - Plan: She is on calcium and vitamin D.  Abnormal serum angiotensin-converting enzyme level - + ACE negative CXR   History of bunionectomy   History of hypertension   History of varicose veins  History of gastroesophageal reflux (GERD)   Orders: Orders Placed This Encounter  Procedures  . [place] Extrem Up Right Ltd  . Ambulatory referral to Physical Therapy   No orders of the defined types were placed in this encounter.   Follow-Up Instructions: Return in about 6 months (around 03/13/2019) for Osteoarthritis.   Pollyann Savoy, MD  Note - This record has been created using Animal nutritionist.  Chart  creation errors have been sought, but may not always  have been located. Such creation errors do not reflect on  the standard of medical care.

## 2018-09-05 ENCOUNTER — Other Ambulatory Visit: Payer: Self-pay | Admitting: Rheumatology

## 2018-09-05 NOTE — Telephone Encounter (Signed)
Last Visit: 08/07/18 Next Visit: 01/31/19  Okay to refill per Dr.Deveshwar

## 2018-09-10 ENCOUNTER — Ambulatory Visit: Payer: Self-pay

## 2018-09-10 ENCOUNTER — Ambulatory Visit (INDEPENDENT_AMBULATORY_CARE_PROVIDER_SITE_OTHER): Payer: Medicare Other | Admitting: Rheumatology

## 2018-09-10 ENCOUNTER — Other Ambulatory Visit: Payer: Self-pay

## 2018-09-10 ENCOUNTER — Encounter: Payer: Self-pay | Admitting: Rheumatology

## 2018-09-10 VITALS — BP 124/67 | HR 67 | Resp 12 | Ht 61.0 in | Wt 137.0 lb

## 2018-09-10 DIAGNOSIS — G8929 Other chronic pain: Secondary | ICD-10-CM

## 2018-09-10 DIAGNOSIS — Z8719 Personal history of other diseases of the digestive system: Secondary | ICD-10-CM

## 2018-09-10 DIAGNOSIS — Z9889 Other specified postprocedural states: Secondary | ICD-10-CM

## 2018-09-10 DIAGNOSIS — Z8679 Personal history of other diseases of the circulatory system: Secondary | ICD-10-CM

## 2018-09-10 DIAGNOSIS — M1712 Unilateral primary osteoarthritis, left knee: Secondary | ICD-10-CM

## 2018-09-10 DIAGNOSIS — R748 Abnormal levels of other serum enzymes: Secondary | ICD-10-CM

## 2018-09-10 DIAGNOSIS — Z96659 Presence of unspecified artificial knee joint: Secondary | ICD-10-CM

## 2018-09-10 DIAGNOSIS — M19042 Primary osteoarthritis, left hand: Secondary | ICD-10-CM | POA: Diagnosis not present

## 2018-09-10 DIAGNOSIS — M19071 Primary osteoarthritis, right ankle and foot: Secondary | ICD-10-CM | POA: Diagnosis not present

## 2018-09-10 DIAGNOSIS — M19041 Primary osteoarthritis, right hand: Secondary | ICD-10-CM

## 2018-09-10 DIAGNOSIS — M5030 Other cervical disc degeneration, unspecified cervical region: Secondary | ICD-10-CM

## 2018-09-10 DIAGNOSIS — M5136 Other intervertebral disc degeneration, lumbar region: Secondary | ICD-10-CM

## 2018-09-10 DIAGNOSIS — M19072 Primary osteoarthritis, left ankle and foot: Secondary | ICD-10-CM

## 2018-09-10 DIAGNOSIS — M8589 Other specified disorders of bone density and structure, multiple sites: Secondary | ICD-10-CM

## 2018-09-10 DIAGNOSIS — M79641 Pain in right hand: Secondary | ICD-10-CM

## 2018-09-10 DIAGNOSIS — Z8261 Family history of arthritis: Secondary | ICD-10-CM

## 2018-09-26 ENCOUNTER — Telehealth: Payer: Self-pay | Admitting: Rheumatology

## 2018-09-26 NOTE — Telephone Encounter (Signed)
Noted  

## 2018-09-26 NOTE — Telephone Encounter (Signed)
Beverlee Nims from [facility] called stating patient is scheduled for physical therapy tomorrow 09/27/18 at 1:00 pm.

## 2018-10-06 ENCOUNTER — Other Ambulatory Visit: Payer: Self-pay | Admitting: Rheumatology

## 2018-10-08 NOTE — Telephone Encounter (Signed)
Last Visit: 09/10/18 Next Visit: 01/31/19  Okay to refill per Dr. Estanislado Pandy

## 2018-10-13 ENCOUNTER — Other Ambulatory Visit: Payer: Self-pay | Admitting: Rheumatology

## 2018-10-15 NOTE — Telephone Encounter (Signed)
Last Visit: 09/10/18 Next Visit: 01/31/19 UDS: 08/07/18 Narc Agreement: 08/07/18  Last Fill: 07/02/18  Okay to refill Tramadol?

## 2018-10-15 NOTE — Telephone Encounter (Signed)
ok 

## 2018-10-29 ENCOUNTER — Other Ambulatory Visit: Payer: Self-pay | Admitting: Internal Medicine

## 2018-11-10 ENCOUNTER — Other Ambulatory Visit: Payer: Self-pay | Admitting: Rheumatology

## 2018-11-12 NOTE — Telephone Encounter (Signed)
Last Visit: 09/10/18 Next Visit: 01/31/19  Okay to refill per Dr. Estanislado Pandy

## 2018-11-19 ENCOUNTER — Encounter: Payer: Self-pay | Admitting: Internal Medicine

## 2018-11-19 ENCOUNTER — Telehealth (INDEPENDENT_AMBULATORY_CARE_PROVIDER_SITE_OTHER): Payer: Medicare Other | Admitting: Internal Medicine

## 2018-11-19 ENCOUNTER — Other Ambulatory Visit: Payer: Self-pay

## 2018-11-19 DIAGNOSIS — R1013 Epigastric pain: Secondary | ICD-10-CM | POA: Diagnosis not present

## 2018-11-19 DIAGNOSIS — M1990 Unspecified osteoarthritis, unspecified site: Secondary | ICD-10-CM | POA: Diagnosis not present

## 2018-11-19 NOTE — Progress Notes (Signed)
   Virtual Visit via Telephone Note  I connected with@ on 11/19/18 at  3:15 PM EDT by telephone and verified that I am speaking with the correct person using two identifiers.   I discussed the limitations, risks, security and privacy concerns of performing an evaluation and management service by telephone and the availability of in person appointments. I also discussed with the patient that there may be a patient responsible charge related to this service. The patient expressed understanding and agreed to proceed.  Location patient: home Location provider: work or home office Participants present for the call: patient, provider Patient did not have a visit in the prior 7 days to address this/these issue(s).   History of Present Illness: Briana Giles presents with a two-week history of upper abdominal pain which she describes as occasional sharp just below her breastbone mid abdomen that will ease off come and go.  This a.m. it was worse had been taking Pepto-Bismol and an antacid. She does have omeprazole in the household and took 1 yesterday. She has arthritis is not taking Celebrex but have been taking ibuprofen 200 mg in the morning many days.  No vomiting diarrhea weight loss fever.  She has had a cholecystectomy.  No associated change in bowel habits.    Observations/Objective: Patient sounds cheerful and well on the phone. I do not appreciate any SOB. Speech and thought processing are grossly intact. Patient reported vitals:na no fever.  Assessment and Plan: Epigastric pain  Osteoarthritis, unspecified osteoarthritis type, unspecified site - some use of ibuprofen  200 in am    Most consistent with GI symptoms possibly gastritis with epigastric pain.  Would have her avoid NSAIDs as much possible begin omeprazole 20 mg a day every day for 2 weeks an hour or so before eating. She can do add on medications and acids etc. if needed. She can take acetaminophen in the morning if  needed for arthritis pain and minimize ibuprofen calendar if she does need to take. Plan in person visit in about 2 weeks and then go from there.  Follow Up Instructions:  See above    [id number] [id number] [id number] 11-20 [id number] 21-30 I did not refer this patient for an OV in the next 24 hours for this/these issue(s).  I discussed the assessment and treatment plan with the patient. The patient was provided an opportunity to ask questions and all were answered. The patient agreed with the plan and demonstrated an understanding of the instructions.   The patient was advised to call back or seek an in-person evaluation if the symptoms worsen or if the condition fails to improve as anticipated.  I provided 12 minutes of non-face-to-face time during this encounter.   Shanon Ace, MD

## 2018-12-05 ENCOUNTER — Other Ambulatory Visit: Payer: Self-pay

## 2018-12-05 ENCOUNTER — Encounter: Payer: Self-pay | Admitting: Internal Medicine

## 2018-12-05 ENCOUNTER — Ambulatory Visit (INDEPENDENT_AMBULATORY_CARE_PROVIDER_SITE_OTHER): Payer: Medicare Other | Admitting: Internal Medicine

## 2018-12-05 VITALS — BP 118/72 | HR 72 | Temp 97.6°F | Wt 136.6 lb

## 2018-12-05 DIAGNOSIS — Z79899 Other long term (current) drug therapy: Secondary | ICD-10-CM | POA: Diagnosis not present

## 2018-12-05 DIAGNOSIS — I10 Essential (primary) hypertension: Secondary | ICD-10-CM

## 2018-12-05 DIAGNOSIS — M8589 Other specified disorders of bone density and structure, multiple sites: Secondary | ICD-10-CM

## 2018-12-05 DIAGNOSIS — R1013 Epigastric pain: Secondary | ICD-10-CM | POA: Diagnosis not present

## 2018-12-05 DIAGNOSIS — E785 Hyperlipidemia, unspecified: Secondary | ICD-10-CM | POA: Diagnosis not present

## 2018-12-05 DIAGNOSIS — M1990 Unspecified osteoarthritis, unspecified site: Secondary | ICD-10-CM | POA: Diagnosis not present

## 2018-12-05 DIAGNOSIS — E2839 Other primary ovarian failure: Secondary | ICD-10-CM

## 2018-12-05 NOTE — Patient Instructions (Signed)
Glad you are doing better .   Limit ibuprofen  Ok to take tylenol and use the topical voltaren before walking and up to 4 x per day .   Can use the omeprezole as needed  And on days you take the ivuprofen . If returns and  persistent or progressive then let me know for further [facility] evaluation   Call fo bone density at [facility] .  Get lab work prefer fasting   At the [facility] ( dp not need appt for this )

## 2018-12-05 NOTE — Progress Notes (Signed)
Chief Complaint  Patient presents with  . Follow-up    Patient states that gastro issues have been doing better since last televisit     HPI: Briana Giles 73 y.o. come in for fu video visit regarding high epigastric pain   Since  Last time she has limited ibuprofen  For knee  To help her when she walks.   Stopped the omeprezole  After  a week.  And was much improved    And now off. gassy.   No vomiting change in bowel habits at this point. She does take Gummies with caffeine usually a few before walking.  Her knee is problematic and may need surgery at some point. She has some Celebrex at home but has not been using that.   ROS: See pertinent positives and negatives per HPI.  No chest pain shortness of breath syncope.  Past Medical History:  Diagnosis Date  . Abdominal pain, other specified site   . Abnormal LFTs   . Allergic rhinitis   . Cervical back pain with evidence of disc disease   . GERD (gastroesophageal reflux disease)   . Heart murmur    says all her life had echo in [place] year ago not to worry  . Hypertension   . Nerve damage    left arm  . Osteoarthritis   . Osteopenia   . Phlebitis   . Spinal stenosis   . Transient global amnesia     Family History  Problem Relation Age of Onset  . Hypertension Father   . Alcohol abuse Father   . Lung cancer Father   . Cancer Maternal Grandmother        sinus  . COPD Mother   . Osteoporosis Mother   . Heart disease Mother   . Hypertension Brother   . Heart Problems Brother   . Arthritis Brother   . Prostate cancer Maternal Grandfather   . Heart attack Brother   . Hypertension Brother   . Healthy Daughter   . Healthy Son   . Colon cancer Neg Hx   . Pancreatic cancer Neg Hx   . Stomach cancer Neg Hx     Social History   Socioeconomic History  . Marital status: Married    Spouse name: Not on file  . Number of children: 2  . Years of education: Not on file  . Highest education level: Not on file   Occupational History  . Occupation: retired Product manager: RETIRED  Social Needs  . Financial resource strain: Not on file  . Food insecurity    Worry: Not on file    Inability: Not on file  . Transportation needs    Medical: Not on file    Non-medical: Not on file  Tobacco Use  . Smoking status: Never Smoker  . Smokeless tobacco: Never Used  Substance and Sexual Activity  . Alcohol use: Yes    Comment: occasional   . Drug use: Never  . Sexual activity: Not on file  Lifestyle  . Physical activity    Days per week: Not on file    Minutes per session: Not on file  . Stress: Not on file  Relationships  . Social Herbalist on phone: Not on file    Gets together: Not on file    Attends religious service: Not on file    Active member of club or organization: Not on file    Attends meetings of  clubs or organizations: Not on file    Relationship status: Not on file  Other Topics Concern  . 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  [place] grand child    Does water exercise aerobics walking elliptical     Outpatient Medications Prior to Visit  Medication Sig Dispense Refill  . acetaminophen (TYLENOL) 500 MG tablet Take 1,000 mg by mouth every 6 (six) hours as needed for mild pain, moderate pain or headache.     . calcium carbonate (OS-CAL - DOSED IN MG OF ELEMENTAL CALCIUM) 1250 (500 CA) MG tablet Take 1,000 mg by mouth daily.    . celecoxib (CELEBREX) 100 MG capsule Take 100 mg by mouth as needed.    Marland Kitchen CHERRY SYRUP PO Take by mouth daily.     . fluticasone (FLONASE) 50 MCG/ACT nasal spray USE TWO SPRAYS IN EACH NOSTRIL ONCE DAILY. (Patient taking differently: as needed. ) 16 g 5  . Ginger, Zingiber officinalis, (GINGER ROOT PO) Take by mouth daily.    Marland Kitchen ibuprofen (ADVIL,MOTRIN) 200 MG tablet Take 200 mg by mouth as needed.    . lidocaine (LIDODERM) 5 %  Place 1 patch onto the skin daily as needed (pain). Reported on 03/30/2015    . lisinopril (ZESTRIL) 20 MG tablet TAKE 1 TABLET BY MOUTH EVERY DAY 90 tablet 0  . LORazepam (ATIVAN) 1 MG tablet Take 0.5-1 tablets (0.5-1 mg total) by mouth 2 (two) times daily as needed for anxiety. Do not take with alcohol. 24 tablet 1  . methocarbamol (ROBAXIN) 500 MG tablet Take 1 tablet (500 mg total) by mouth daily. 30 tablet 1  . Omega-3 Fatty Acids (OMEGA 3 500 PO) Take by mouth daily.    . polyethylene glycol powder (MIRALAX) powder Take 17 g by mouth once. (Patient taking differently: Take 17 g by mouth as needed. ) 255 g 0  . tiZANidine (ZANAFLEX) 4 MG tablet TAKE 1 TABLET BY MOUTH AT BEDTIME 30 tablet 0  . traMADol (ULTRAM) 50 MG tablet TAKE 1 TABLET BY MOUTH TWICE DAILY AS NEEDED 60 tablet 0  . TURMERIC PO Take 1 tablet by mouth daily.      No facility-administered medications prior to visit.      EXAM:  BP 118/72 (BP Location: Right Arm, Patient Position: Sitting, Cuff Size: Normal)   Pulse 72   Temp 97.6 F (36.4 C) (Temporal)   Wt 136 lb 9.6 oz (62 kg)   SpO2 97%   BMI 25.81 kg/m   Body mass index is 25.81 kg/m.  GENERAL: vitals reviewed and listed above, alert, oriented, appears well hydrated and in no acute distress HEENT: atraumatic, conjunctiva  clear, no obvious abnormalities on inspection of external nose and ears OP : Masked NECK: no obvious masses on inspection palpation  LUNGS: clear to auscultation bilaterally, no wheezes, rales or rhonchi, good air movement CV: HRRR, no clubbing cyanosis or  peripheral edema nl cap refill she has a short systolic ejection murmur heard pretty much only when supine without radiation. Abdomen soft without organomegaly guarding or rebound points to the area of high epigastrium when she has had problems. MS: moves all extremities without noticeable focal  abnormality PSYCH: pleasant and cooperative, no obvious depression or anxiety Lab Results   Component Value Date   WBC 6.4 10/25/2016   HGB 13.3 10/25/2016  HCT 40.2 10/25/2016   PLT 232 10/25/2016   GLUCOSE 87 05/02/2017   CHOL 236 (H) 05/02/2017   TRIG 171.0 (H) 05/02/2017   HDL 67.40 05/02/2017   LDLDIRECT 127.4 12/27/2011   LDLCALC 134 (H) 05/02/2017   ALT 22 05/02/2017   AST 28 05/02/2017   NA 141 05/02/2017   K 4.9 05/02/2017   CL 101 05/02/2017   CREATININE 0.89 05/02/2017   BUN 28 (H) 05/02/2017   CO2 27 05/02/2017   TSH 2.45 05/02/2017   BP Readings from Last 3 Encounters:  12/05/18 118/72  09/10/18 124/67  08/07/18 (!) 142/73   Wt Readings from Last 3 Encounters:  12/05/18 136 lb 9.6 oz (62 kg)  09/10/18 137 lb (62.1 kg)  08/07/18 136 lb 9.6 oz (62 kg)     ASSESSMENT AND PLAN:  Discussed the following assessment and plan:  Medication management - Plan: Basic metabolic panel, CBC with Differential/Platelet, Hepatic function panel, Lipid panel, TSH  Osteoarthritis, unspecified osteoarthritis type, unspecified site - Plan: Basic metabolic panel, CBC with Differential/Platelet, Hepatic function panel, Lipid panel, TSH  Hyperlipidemia, unspecified hyperlipidemia type - Plan: Basic metabolic panel, CBC with Differential/Platelet, Hepatic function panel, Lipid panel, TSH  Essential hypertension - Plan: Basic metabolic panel, CBC with Differential/Platelet, Hepatic function panel, Lipid panel, TSH  Osteopenia of multiple sites - Plan: Basic metabolic panel, CBC with Differential/Platelet, Hepatic function panel, Lipid panel, TSH, DG Bone Density  Estrogen deficiency - Plan: DG Bone Density  Epigastric pain - Plan: Basic metabolic panel, CBC with Differential/Platelet, Hepatic function panel, Lipid panel, TSH Suspect this was a temporary gastritis possibly related to the NSAID significant symptomatic provement after week of omeprazole and limiting NSAID.  Discussed options at this time may try topical anti-inflammatory to her knee Tylenol limit other use  consider Celebrex and can use the omeprazole as needed. If persistent progressive plan follow-up consider GI involvement. Also ordered bone density and lab work to be done if she make an appointment if she is due and plan a CPX when convenient for her and get on the schedule.  -Patient advised to return or notify health care team  if  new concerns arise.  Patient Instructions  Glad you are doing better .   Limit ibuprofen  Ok to take tylenol and use the topical voltaren before walking and up to 4 x per day .   Can use the omeprezole as needed  And on days you take the ivuprofen . If returns and  persistent or progressive then let me know for further [facility] evaluation   Call fo bone density at [facility] .  Get lab work prefer fasting   At the [facility] ( dp not need appt for this )      Mariann Laster K. Bralyn Folkert M.D.

## 2018-12-14 ENCOUNTER — Other Ambulatory Visit: Payer: Self-pay | Admitting: Rheumatology

## 2018-12-14 ENCOUNTER — Telehealth: Payer: Self-pay | Admitting: Rheumatology

## 2018-12-14 NOTE — Telephone Encounter (Signed)
Per Hazel Sams, PA-C okay to send in 30 day supply

## 2018-12-14 NOTE — Telephone Encounter (Signed)
Last Visit:09/10/18 Next Visit:01/31/19 Labs: 05/02/17 hepatic function WNL BMP, stable  Patient advised she needs update labs. Patient states she she out of medication and she needs it now. Patient states she will have labs update with PCP in 2 weeks.

## 2018-12-14 NOTE — Telephone Encounter (Signed)
See previous phone call note.

## 2018-12-14 NOTE — Telephone Encounter (Signed)
Patient called stating "I have a problem with a prescription and I need it taken care of today."   Please call back at [phone]

## 2018-12-14 NOTE — Telephone Encounter (Signed)
Patient called requesting prescription refill of Celebrex to be sent to [facility] at [place].  Patient states she is out of medication and requesting the refill be sent today.

## 2018-12-14 NOTE — Telephone Encounter (Signed)
Patient advised.

## 2018-12-14 NOTE — Telephone Encounter (Addendum)
Last Visit:09/10/18 Next Visit:01/31/19 Labs: 05/02/17 hepatic function WNL BMP, stable  Per Hazel Sams, PA-C Okay to refill 30 day supply

## 2018-12-17 ENCOUNTER — Other Ambulatory Visit: Payer: Self-pay | Admitting: Internal Medicine

## 2018-12-17 DIAGNOSIS — Z1231 Encounter for screening mammogram for malignant neoplasm of breast: Secondary | ICD-10-CM

## 2018-12-26 ENCOUNTER — Other Ambulatory Visit (INDEPENDENT_AMBULATORY_CARE_PROVIDER_SITE_OTHER): Payer: Medicare Other

## 2018-12-26 ENCOUNTER — Other Ambulatory Visit: Payer: Self-pay

## 2018-12-26 DIAGNOSIS — R1013 Epigastric pain: Secondary | ICD-10-CM

## 2018-12-26 DIAGNOSIS — E785 Hyperlipidemia, unspecified: Secondary | ICD-10-CM | POA: Diagnosis not present

## 2018-12-26 DIAGNOSIS — M8589 Other specified disorders of bone density and structure, multiple sites: Secondary | ICD-10-CM

## 2018-12-26 DIAGNOSIS — Z79899 Other long term (current) drug therapy: Secondary | ICD-10-CM | POA: Diagnosis not present

## 2018-12-26 DIAGNOSIS — M1990 Unspecified osteoarthritis, unspecified site: Secondary | ICD-10-CM | POA: Diagnosis not present

## 2018-12-26 DIAGNOSIS — I10 Essential (primary) hypertension: Secondary | ICD-10-CM

## 2018-12-26 LAB — BASIC METABOLIC PANEL
BUN: 21 mg/dL (ref 6–23)
CO2: 29 mEq/L (ref 19–32)
Calcium: 9.8 mg/dL (ref 8.4–10.5)
Chloride: 104 mEq/L (ref 96–112)
Creatinine, Ser: 0.86 mg/dL (ref 0.40–1.20)
GFR: 64.45 mL/min (ref >60.00)
Glucose, Bld: 82 mg/dL (ref 70–99)
Potassium: 5.1 mEq/L (ref 3.5–5.1)
Sodium: 141 mEq/L (ref 135–145)

## 2018-12-26 LAB — HEPATIC FUNCTION PANEL
ALT: 20 U/L (ref 0–35)
AST: 20 U/L (ref 0–37)
Albumin: 4.5 g/dL (ref 3.5–5.2)
Alkaline Phosphatase: 105 U/L (ref 39–117)
Bilirubin, Direct: 0.1 mg/dL (ref 0.0–0.3)
Total Bilirubin: 0.4 mg/dL (ref 0.2–1.2)
Total Protein: 7 g/dL (ref 6.0–8.3)

## 2018-12-26 LAB — LIPID PANEL
Cholesterol: 242 mg/dL — ABNORMAL HIGH (ref 0–200)
HDL: 65.3 mg/dL (ref >39.00)
LDL Cholesterol: 151 mg/dL — ABNORMAL HIGH (ref 0–99)
NonHDL: 176.78
Total CHOL/HDL Ratio: 4
Triglycerides: 130 mg/dL (ref 0.0–149.0)
VLDL: 26 mg/dL (ref 0.0–40.0)

## 2018-12-26 LAB — TSH: TSH: 1.97 u[IU]/mL (ref 0.35–4.50)

## 2018-12-26 LAB — CBC WITH DIFFERENTIAL/PLATELET
Basophils Absolute: 0.1 10*3/uL (ref 0.0–0.1)
Basophils Relative: 1.1 % (ref 0.0–3.0)
Eosinophils Absolute: 0.3 10*3/uL (ref 0.0–0.7)
Eosinophils Relative: 4.1 % (ref 0.0–5.0)
HCT: 40.3 % (ref 36.0–46.0)
Hemoglobin: 13.5 g/dL (ref 12.0–15.0)
Lymphocytes Relative: 15.3 % (ref 12.0–46.0)
Lymphs Abs: 1.2 10*3/uL (ref 0.7–4.0)
MCHC: 33.6 g/dL (ref 30.0–36.0)
MCV: 94.9 fl (ref 78.0–100.0)
Monocytes Absolute: 0.5 10*3/uL (ref 0.1–1.0)
Monocytes Relative: 6.5 % (ref 3.0–12.0)
Neutro Abs: 5.9 10*3/uL (ref 1.4–7.7)
Neutrophils Relative %: 73 % (ref 43.0–77.0)
Platelets: 273 10*3/uL (ref 150.0–400.0)
RBC: 4.25 Mil/uL (ref 3.87–5.11)
RDW: 13.3 % (ref 11.5–15.5)
WBC: 8.1 10*3/uL (ref 4.0–10.5)

## 2018-12-27 ENCOUNTER — Telehealth: Payer: Self-pay | Admitting: Rheumatology

## 2018-12-27 MED ORDER — TIZANIDINE HCL 4 MG PO TABS: 4.0000 mg | ORAL_TABLET | Freq: Every day | ORAL | 0 refills | Status: DC

## 2018-12-27 NOTE — Telephone Encounter (Signed)
Last Visit:09/10/18 Next Visit:01/31/19  Okay to refill per Dr. Estanislado Pandy   Left message to advise patient we have not received a prescription refill request and have not denied the prescription. Patient advised the prescription sent to the office.

## 2018-12-27 NOTE — Telephone Encounter (Signed)
Patient called requesting prescription refill of Tizanidine to be sent to [facility] at [place].  Patient states she received a text from [place] that Dr. Estanislado Pandy denied her prescription refill.  Patient states she only has 1 pill remaining.

## 2019-01-01 ENCOUNTER — Other Ambulatory Visit: Payer: Self-pay | Admitting: *Deleted

## 2019-01-01 MED ORDER — TRAMADOL HCL 50 MG PO TABS: 50.0000 mg | ORAL_TABLET | Freq: Two times a day (BID) | ORAL | 0 refills | Status: DC | PRN

## 2019-01-01 NOTE — Telephone Encounter (Signed)
Refill request received via fax  Last Visit: 09/10/18 Next Visit: 01/31/19 UDS: 08/07/18 Narc Agreement: 08/07/18  Last Fill: 10/15/18   Okay to refill Tramadol?

## 2019-01-01 NOTE — Progress Notes (Signed)
Chief Complaint  Patient presents with  . Annual Exam    Pt states she is still having upper abdomen pain     HPI: Briana Giles 73 y.o. comes in today for Preventive Medicare exam/ wellness visit and med check  F/U epigastric pain   Still some  Epigastric pain  Not as bad but achy.   On omeprazole  No longer sharp but persistent daily high epigastrym Not related  BP  ok Arthritis   Med celebrex  Daily   Knee evaluation   consdiering knee replacement    bracey  Knee evaluation   Health Maintenance  Topic Date Due  . MAMMOGRAM  10/28/2018  . COLONOSCOPY  12/29/2022  . TETANUS/TDAP  01/07/2024  . INFLUENZA VACCINE  Completed  . DEXA SCAN  Completed  . Hepatitis C Screening  Completed  . PNA vac Low Risk Adult  Completed   Health Maintenance Review LIFESTYLE:  Exercise:  Less with knee and covid  Tobacco/ETS: no Alcohol:  2 per day  Sugar beverages: Sleep:  6 hours  Drug use: no HH:2    Hearing: ok  Vision:  No limitations at present . Last eye check UTD  Safety:  Has smoke detector and wears seat belts.  No firearms. No excess sun exposure. Sees dentist regularly.  Falls: n.  Memory: Felt to be no change   , no concern from her or her family.  Depression: No anhedonia unusual crying or depressive symptoms  Nutrition: Eats well balanced diet; adequate calcium and vitamin D. No swallowing chewing problems.  Injury: no major injuries in the last six months.  Other healthcare providers:  Reviewed today .  Preventive parameters: up-to-date  Reviewed   ADLS:   There are no problems or need for assistance  driving, feeding, obtaining food, dressing, toileting and bathing, managing money using phone. She is independent. ROS:  GEN/ HEENT: No fever, significant weight changes sweats headaches vision problems hearing changes, CV/ PULM; No chest pain shortness of breath cough, syncope,edema  change in exercise tolerance. GI /GU: No adominal pain, vomiting, change in  bowel habits. No blood in the stool. No significant GU symptoms. SKIN/HEME: ,no acute skin rashes suspicious lesions or bleeding. No lymphadenopathy, nodules, masses.  NEURO/ PSYCH:  No neurologic signs such as weakness numbness. No depression anxiety. IMM/ Allergy: No unusual infections.  Allergy .   REST of 12 system review negative except as per HPI   Past Medical History:  Diagnosis Date  . Abdominal pain, other specified site   . Abnormal LFTs   . Allergic rhinitis   . Cervical back pain with evidence of disc disease   . GERD (gastroesophageal reflux disease)   . Heart murmur    says all her life had echo in [place] year ago not to worry  . Hypertension   . Nerve damage    left arm  . Osteoarthritis   . Osteopenia   . Phlebitis   . Spinal stenosis   . Transient global amnesia     Family History  Problem Relation Age of Onset  . Hypertension Father   . Alcohol abuse Father   . Lung cancer Father   . Cancer Maternal Grandmother        sinus  . COPD Mother   . Osteoporosis Mother   . Heart disease Mother   . Hypertension Brother   . Heart Problems Brother   . Arthritis Brother   . Prostate cancer Maternal Grandfather   .  Heart attack Brother   . Hypertension Brother   . Healthy Daughter   . Healthy Son   . Colon cancer Neg Hx   . Pancreatic cancer Neg Hx   . Stomach cancer Neg Hx     Social History   Socioeconomic History  . Marital status: Married    Spouse name: Not on file  . Number of children: 2  . Years of education: Not on file  . Highest education level: Not on file  Occupational History  . Occupation: retired Product manager: RETIRED  Social Needs  . Financial resource strain: Not on file  . Food insecurity    Worry: Not on file    Inability: Not on file  . Transportation needs    Medical: Not on file    Non-medical: Not on file  Tobacco Use  . Smoking status: Never Smoker  . Smokeless tobacco: Never Used  Substance and Sexual  Activity  . Alcohol use: Yes    Comment: occasional   . Drug use: Never  . Sexual activity: Not on file  Lifestyle  . Physical activity    Days per week: Not on file    Minutes per session: Not on file  . Stress: Not on file  Relationships  . Social Herbalist on phone: Not on file    Gets together: Not on file    Attends religious service: Not on file    Active member of club or organization: Not on file    Attends meetings of clubs or organizations: Not on file    Relationship status: Not on file  Other Topics Concern  . 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  [place] grand child    Does water exercise aerobics walking elliptical     Outpatient Encounter Medications as of 01/02/2019  Medication Sig  . acetaminophen (TYLENOL) 500 MG tablet Take 1,000 mg by mouth every 6 (six) hours as needed for mild pain, moderate pain or headache.   . calcium carbonate (OS-CAL - DOSED IN MG OF ELEMENTAL CALCIUM) 1250 (500 CA) MG tablet Take 1,000 mg by mouth daily.  . celecoxib (CELEBREX) 100 MG capsule TAKE 1 CAPSULE(100 MG) BY MOUTH DAILY AS NEEDED  . CHERRY SYRUP PO Take by mouth daily.   . fluticasone (FLONASE) 50 MCG/ACT nasal spray USE TWO SPRAYS IN EACH NOSTRIL ONCE DAILY. (Patient taking differently: as needed. )  . Ginger, Zingiber officinalis, (GINGER ROOT PO) Take by mouth daily.  Marland Kitchen ibuprofen (ADVIL,MOTRIN) 200 MG tablet Take 200 mg by mouth as needed.  . lidocaine (LIDODERM) 5 % Place 1 patch onto the skin daily as needed (pain). Reported on 03/30/2015  . lisinopril (ZESTRIL) 20 MG tablet TAKE 1 TABLET BY MOUTH EVERY DAY  . LORazepam (ATIVAN) 1 MG tablet Take 0.5-1 tablets (0.5-1 mg total) by mouth 2 (two) times daily as needed for anxiety. Do not take with alcohol.  . methocarbamol (ROBAXIN) 500 MG tablet Take 1 tablet (500 mg total) by mouth daily.  . Omega-3  Fatty Acids (OMEGA 3 500 PO) Take by mouth daily.  . polyethylene glycol powder (MIRALAX) powder Take 17 g by mouth once. (Patient taking differently: Take 17 g by mouth as needed. )  . tiZANidine (ZANAFLEX) 4 MG tablet Take 1 tablet (4 mg total) by  mouth at bedtime.  . traMADol (ULTRAM) 50 MG tablet Take 1 tablet (50 mg total) by mouth 2 (two) times daily as needed.  . TURMERIC PO Take 1 tablet by mouth daily.   . [DISCONTINUED] traMADol (ULTRAM) 50 MG tablet TAKE 1 TABLET BY MOUTH TWICE DAILY AS NEEDED   No facility-administered encounter medications on file as of 01/02/2019.     EXAM:  BP 136/80 (BP Location: Right Arm, Patient Position: Sitting, Cuff Size: Normal)   Pulse 95   Temp 97.8 F (36.6 C) (Temporal)   Ht 5\' 1"  (1.549 m)   Wt 136 lb 6.4 oz (61.9 kg)   SpO2 97%   BMI 25.77 kg/m   Body mass index is 25.77 kg/m.  Physical Exam: Vital signs reviewed [id number] is a well-developed well-nourished alert cooperative   who appears stated age in no acute distress.  HEENT: normocephalic atraumatic , Eyes: PERRL EOM's full, conjunctiva clear, Nares: paten,t no deformity discharge or tenderness., Ears: no deformity EAC's clear TMs with normal landmarks. Mouth masked  NECK: supple without masses, thyromegaly or bruits. CHEST/PULM:  Clear to auscultation and percussion breath sounds equal no wheeze , rales or rhonchi. No chest wall deformities or tenderness. CV: PMI is nondisplaced, S1 S2 no gallops,  Rubs. Faint systolic murmur  Sitting  Not laying  Peripheral pulses are full without delay.No JVD .  ABDOMEN: Bowel sounds normal nontender  No guard or rebound, no hepato splenomegal no CVA tenderness.   Extremtities:  No clubbing cyanosis or edema, no acute joint swelling or redness no focal atrophy NEURO:  Oriented x3, cranial nerves 3-12 appear to be intact, no obvious focal weakness,gait within normal limits no abnormal reflexes or asymmetrical SKIN: No acute rashes normal turgor,  color, few  Arm ecchymosis  No other  PSYCH: Oriented, good eye contact, no obvious depression anxiety, cognition and judgment appear normal. LN: no cervical axillary inguinal adenopathy No noted deficits in memory, attention, and speech.   Lab Results  Component Value Date   WBC 8.1 12/26/2018   HGB 13.5 12/26/2018   HCT 40.3 12/26/2018   PLT 273.0 12/26/2018   GLUCOSE 82 12/26/2018   CHOL 242 (H) 12/26/2018   TRIG 130.0 12/26/2018   HDL 65.30 12/26/2018   LDLDIRECT 127.4 12/27/2011   LDLCALC 151 (H) 12/26/2018   ALT 20 12/26/2018   AST 20 12/26/2018   NA 141 12/26/2018   K 5.1 12/26/2018   CL 104 12/26/2018   CREATININE 0.86 12/26/2018   BUN 21 12/26/2018   CO2 29 12/26/2018   TSH 1.97 12/26/2018    ASSESSMENT AND PLAN:  Discussed the following assessment and plan:  Visit for preventive health examination  Epigastric pain - improved some but persistent  on ppi  and having to take daily cox 2 for joint pain plan gi consult  - Plan: Ambulatory referral to Gastroenterology  Medication management  Osteoarthritis, unspecified osteoarthritis type, unspecified site  Hyperlipidemia, unspecified hyperlipidemia type - lsi for now consider meds if appropriat  has been lower in opast   Essential hypertension  Encounter for monitoring Cox-2 selective NSAID therapy - Plan: Ambulatory referral to Gastroenterology Return in about 1 year (around 01/02/2020) for cpx with labs. Or earlier as needed for med check etc  Patient Care Team: Oz Gammel, Standley Brooking, MD as PCP - General Regal, Tamala Fothergill, DPM (Podiatry) Bo Merino, MD (Rheumatology) Milus Banister, MD (Gastroenterology) Jari Pigg, MD as Attending Physician (Dermatology) Laurance Flatten, MD as Referring Physician (Orthopedic Surgery)  Patient Instructions  Will do referral to GI about the  Abdominal  sx you are still having  Labs are pretty good  Cholesterol could be better .   Could consider  Medication  But can  wait also  And see after  Rehab.   Plan cpx in a year or as needed .    Health Maintenance, Female Adopting a healthy lifestyle and getting preventive care are important in promoting health and wellness. Ask your health care provider about:  The right schedule for you to have regular tests and exams.  Things you can do on your own to prevent diseases and keep yourself healthy. What should I know about diet, weight, and exercise? Eat a healthy diet   Eat a diet that includes plenty of vegetables, fruits, low-fat dairy products, and lean protein.  Do not eat a lot of foods that are high in solid fats, added sugars, or sodium. Maintain a healthy weight Body mass index (BMI) is used to identify weight problems. It estimates body fat based on height and weight. Your health care provider can help determine your BMI and help you achieve or maintain a healthy weight. Get regular exercise Get regular exercise. This is one of the most important things you can do for your health. Most adults should:  Exercise for at least 150 minutes each week. The exercise should increase your heart rate and make you sweat (moderate-intensity exercise).  Do strengthening exercises at least twice a week. This is in addition to the moderate-intensity exercise.  Spend less time sitting. Even light physical activity can be beneficial. Watch cholesterol and blood lipids Have your blood tested for lipids and cholesterol at 74 years of age, then have this test every 5 years. Have your cholesterol levels checked more often if:  Your lipid or cholesterol levels are high.  You are older than 74 years of age.  You are at high risk for heart disease. What should I know about cancer screening? Depending on your health history and family history, you may need to have cancer screening at various ages. This may include screening for:  Breast cancer.  Cervical cancer.  Colorectal cancer.  Skin cancer.  Lung  cancer. What should I know about heart disease, diabetes, and high blood pressure? Blood pressure and heart disease  High blood pressure causes heart disease and increases the risk of stroke. This is more likely to develop in people who have high blood pressure readings, are of African descent, or are overweight.  Have your blood pressure checked: ? Every 3-5 years if you are 17-46 years of age. ? Every year if you are 63 years old or older. Diabetes Have regular diabetes screenings. This checks your fasting blood sugar level. Have the screening done:  Once every three years after age 33 if you are at a normal weight and have a low risk for diabetes.  More often and at a younger age if you are overweight or have a high risk for diabetes. What should I know about preventing infection? Hepatitis B If you have a higher risk for hepatitis B, you should be screened for this virus. Talk with your health care provider to find out if you are at risk for hepatitis B infection. Hepatitis C Testing is recommended for:  Everyone born from [birth year] through 1966.  Anyone with known risk factors for hepatitis C. Sexually transmitted infections (STIs)  Get screened for STIs, including gonorrhea and chlamydia, if: ? You are sexually active  and are younger than 74 years of age. ? You are older than 74 years of age and your health care provider tells you that you are at risk for this type of infection. ? Your sexual activity has changed since you were last screened, and you are at increased risk for chlamydia or gonorrhea. Ask your health care provider if you are at risk.  Ask your health care provider about whether you are at high risk for HIV. Your health care provider may recommend a prescription medicine to help prevent HIV infection. If you choose to take medicine to prevent HIV, you should first get tested for HIV. You should then be tested every 3 months for as long as you are taking the medicine.  Pregnancy  If you are about to stop having your period (premenopausal) and you may become pregnant, seek counseling before you get pregnant.  Take 400 to 800 micrograms (mcg) of folic acid every day if you become pregnant.  Ask for birth control (contraception) if you want to prevent pregnancy. Osteoporosis and menopause Osteoporosis is a disease in which the bones lose minerals and strength with aging. This can result in bone fractures. If you are 16 years old or older, or if you are at risk for osteoporosis and fractures, ask your health care provider if you should:  Be screened for bone loss.  Take a calcium or vitamin D supplement to lower your risk of fractures.  Be given hormone replacement therapy (HRT) to treat symptoms of menopause. Follow these instructions at home: Lifestyle  Do not use any products that contain nicotine or tobacco, such as cigarettes, e-cigarettes, and chewing tobacco. If you need help quitting, ask your health care provider.  Do not use street drugs.  Do not share needles.  Ask your health care provider for help if you need support or information about quitting drugs. Alcohol use  Do not drink alcohol if: ? Your health care provider tells you not to drink. ? You are pregnant, may be pregnant, or are planning to become pregnant.  If you drink alcohol: ? Limit how much you use to 0-1 drink a day. ? Limit intake if you are breastfeeding.  Be aware of how much alcohol is in your drink. In the [place] one drink equals one 12 oz bottle of beer (355 mL), one 5 oz glass of wine (148 mL), or one 1 oz glass of hard liquor (44 mL). General instructions  Schedule regular health, dental, and eye exams.  Stay current with your vaccines.  Tell your health care provider if: ? You often feel depressed. ? You have ever been abused or do not feel safe at home. Summary  Adopting a healthy lifestyle and getting preventive care are important in promoting health  and wellness.  Follow your health care provider's instructions about healthy diet, exercising, and getting tested or screened for diseases.  Follow your health care provider's instructions on monitoring your cholesterol and blood pressure. This information is not intended to replace advice given to you by your health care provider. Make sure you discuss any questions you have with your health care provider. Document Released: 08/16/2010 Document Revised: 01/24/2018 Document Reviewed: 01/24/2018 Elsevier Patient Education  2020 [place] Dejae Bernet M.D.

## 2019-01-02 ENCOUNTER — Encounter: Payer: Self-pay | Admitting: Internal Medicine

## 2019-01-02 ENCOUNTER — Other Ambulatory Visit: Payer: Self-pay

## 2019-01-02 ENCOUNTER — Ambulatory Visit (INDEPENDENT_AMBULATORY_CARE_PROVIDER_SITE_OTHER): Payer: Medicare Other | Admitting: Internal Medicine

## 2019-01-02 VITALS — BP 136/80 | HR 95 | Temp 97.8°F | Ht 61.0 in | Wt 136.4 lb

## 2019-01-02 DIAGNOSIS — M1990 Unspecified osteoarthritis, unspecified site: Secondary | ICD-10-CM | POA: Diagnosis not present

## 2019-01-02 DIAGNOSIS — Z79899 Other long term (current) drug therapy: Secondary | ICD-10-CM | POA: Diagnosis not present

## 2019-01-02 DIAGNOSIS — I10 Essential (primary) hypertension: Secondary | ICD-10-CM

## 2019-01-02 DIAGNOSIS — Z791 Long term (current) use of non-steroidal anti-inflammatories (NSAID): Secondary | ICD-10-CM

## 2019-01-02 DIAGNOSIS — R1013 Epigastric pain: Secondary | ICD-10-CM | POA: Diagnosis not present

## 2019-01-02 DIAGNOSIS — E785 Hyperlipidemia, unspecified: Secondary | ICD-10-CM | POA: Diagnosis not present

## 2019-01-02 DIAGNOSIS — Z0000 Encounter for general adult medical examination without abnormal findings: Secondary | ICD-10-CM | POA: Diagnosis not present

## 2019-01-02 DIAGNOSIS — Z5181 Encounter for therapeutic drug level monitoring: Secondary | ICD-10-CM

## 2019-01-02 NOTE — Patient Instructions (Addendum)
Will do referral to GI about the  Abdominal  sx you are still having  Labs are pretty good  Cholesterol could be better .   Could consider  Medication  But can wait also  And see after  Rehab.   Plan cpx in a year or as needed .    Health Maintenance, Female Adopting a healthy lifestyle and getting preventive care are important in promoting health and wellness. Ask your health care provider about:  The right schedule for you to have regular tests and exams.  Things you can do on your own to prevent diseases and keep yourself healthy. What should I know about diet, weight, and exercise? Eat a healthy diet   Eat a diet that includes plenty of vegetables, fruits, low-fat dairy products, and lean protein.  Do not eat a lot of foods that are high in solid fats, added sugars, or sodium. Maintain a healthy weight Body mass index (BMI) is used to identify weight problems. It estimates body fat based on height and weight. Your health care provider can help determine your BMI and help you achieve or maintain a healthy weight. Get regular exercise Get regular exercise. This is one of the most important things you can do for your health. Most adults should:  Exercise for at least 150 minutes each week. The exercise should increase your heart rate and make you sweat (moderate-intensity exercise).  Do strengthening exercises at least twice a week. This is in addition to the moderate-intensity exercise.  Spend less time sitting. Even light physical activity can be beneficial. Watch cholesterol and blood lipids Have your blood tested for lipids and cholesterol at 74 years of age, then have this test every 5 years. Have your cholesterol levels checked more often if:  Your lipid or cholesterol levels are high.  You are older than 74 years of age.  You are at high risk for heart disease. What should I know about cancer screening? Depending on your health history and family history, you may need  to have cancer screening at various ages. This may include screening for:  Breast cancer.  Cervical cancer.  Colorectal cancer.  Skin cancer.  Lung cancer. What should I know about heart disease, diabetes, and high blood pressure? Blood pressure and heart disease  High blood pressure causes heart disease and increases the risk of stroke. This is more likely to develop in people who have high blood pressure readings, are of African descent, or are overweight.  Have your blood pressure checked: ? Every 3-5 years if you are 49-71 years of age. ? Every year if you are 69 years old or older. Diabetes Have regular diabetes screenings. This checks your fasting blood sugar level. Have the screening done:  Once every three years after age 1 if you are at a normal weight and have a low risk for diabetes.  More often and at a younger age if you are overweight or have a high risk for diabetes. What should I know about preventing infection? Hepatitis B If you have a higher risk for hepatitis B, you should be screened for this virus. Talk with your health care provider to find out if you are at risk for hepatitis B infection. Hepatitis C Testing is recommended for:  Everyone born from [birth year] through 1966.  Anyone with known risk factors for hepatitis C. Sexually transmitted infections (STIs)  Get screened for STIs, including gonorrhea and chlamydia, if: ? You are sexually active and are younger than  74 years of age. ? You are older than 74 years of age and your health care provider tells you that you are at risk for this type of infection. ? Your sexual activity has changed since you were last screened, and you are at increased risk for chlamydia or gonorrhea. Ask your health care provider if you are at risk.  Ask your health care provider about whether you are at high risk for HIV. Your health care provider may recommend a prescription medicine to help prevent HIV infection. If you choose  to take medicine to prevent HIV, you should first get tested for HIV. You should then be tested every 3 months for as long as you are taking the medicine. Pregnancy  If you are about to stop having your period (premenopausal) and you may become pregnant, seek counseling before you get pregnant.  Take 400 to 800 micrograms (mcg) of folic acid every day if you become pregnant.  Ask for birth control (contraception) if you want to prevent pregnancy. Osteoporosis and menopause Osteoporosis is a disease in which the bones lose minerals and strength with aging. This can result in bone fractures. If you are 16 years old or older, or if you are at risk for osteoporosis and fractures, ask your health care provider if you should:  Be screened for bone loss.  Take a calcium or vitamin D supplement to lower your risk of fractures.  Be given hormone replacement therapy (HRT) to treat symptoms of menopause. Follow these instructions at home: Lifestyle  Do not use any products that contain nicotine or tobacco, such as cigarettes, e-cigarettes, and chewing tobacco. If you need help quitting, ask your health care provider.  Do not use street drugs.  Do not share needles.  Ask your health care provider for help if you need support or information about quitting drugs. Alcohol use  Do not drink alcohol if: ? Your health care provider tells you not to drink. ? You are pregnant, may be pregnant, or are planning to become pregnant.  If you drink alcohol: ? Limit how much you use to 0-1 drink a day. ? Limit intake if you are breastfeeding.  Be aware of how much alcohol is in your drink. In the [place] one drink equals one 12 oz bottle of beer (355 mL), one 5 oz glass of wine (148 mL), or one 1 oz glass of hard liquor (44 mL). General instructions  Schedule regular health, dental, and eye exams.  Stay current with your vaccines.  Tell your health care provider if: ? You often feel depressed. ? You  have ever been abused or do not feel safe at home. Summary  Adopting a healthy lifestyle and getting preventive care are important in promoting health and wellness.  Follow your health care provider's instructions about healthy diet, exercising, and getting tested or screened for diseases.  Follow your health care provider's instructions on monitoring your cholesterol and blood pressure. This information is not intended to replace advice given to you by your health care provider. Make sure you discuss any questions you have with your health care provider. Document Released: 08/16/2010 Document Revised: 01/24/2018 Document Reviewed: 01/24/2018 Elsevier Patient Education  2020 [employer].

## 2019-01-03 ENCOUNTER — Encounter: Payer: Self-pay | Admitting: Gastroenterology

## 2019-01-08 ENCOUNTER — Other Ambulatory Visit: Payer: Self-pay | Admitting: Rheumatology

## 2019-01-08 NOTE — Telephone Encounter (Signed)
Last Visit: 09/10/2018 Next Visit: 01/31/2019 Labs: 12/26/2018   Okay to refill per Dr. Estanislado Pandy.   Resending for 90 day supply.

## 2019-01-08 NOTE — Telephone Encounter (Signed)
Last Visit: 09/10/2018 Next Visit: 01/31/2019 Labs: 12/26/2018   Okay to refill per Dr. Estanislado Pandy.

## 2019-01-22 ENCOUNTER — Other Ambulatory Visit: Payer: Self-pay | Admitting: Internal Medicine

## 2019-01-22 NOTE — Telephone Encounter (Signed)
Last ov:01/02/2019 Last filled :06/05/2018

## 2019-01-26 ENCOUNTER — Other Ambulatory Visit: Payer: Self-pay | Admitting: Internal Medicine

## 2019-01-31 ENCOUNTER — Ambulatory Visit: Payer: Medicare Other | Admitting: Rheumatology

## 2019-01-31 NOTE — Progress Notes (Signed)
Virtual Visit via Telephone Note  I connected with Briana Giles on 02/05/19 at  9:15 AM EST by telephone and verified that I am speaking with the correct person using two identifiers.  Location: Patient: Home  Provider: Clinic  This service was conducted via virtual visit.   The patient was located at home. I was located in my office.  Consent was obtained prior to the virtual visit and is aware of possible charges through their insurance for this visit.  The patient is an established patient.  Dr. Estanislado Pandy, MD conducted the virtual visit and Hazel Sams, PA-C acted as scribe during the service.  Office staff helped with scheduling follow up visits after the service was conducted.   I discussed the limitations, risks, security and privacy concerns of performing an evaluation and management service by telephone and the availability of in person appointments. I also discussed with the patient that there may be a patient responsible charge related to this service. The patient expressed understanding and agreed to proceed.  CC: Left knee joint pain  History of Present Illness: Patient is a 73 year old female with a past medical history of osteoarthritis and DDD. She is scheduled for an upcoming left knee joint replacement with Dr. Jaclynn Guarneri.   She has persistent neck pain and stiffness.  She did not notice much improvement with PT. She recently got a traction machine but has not used it yet.  She is not having any lower back pain at this time.   Review of Systems  Constitutional: Negative for fever and malaise/fatigue.  HENT: Negative for congestion.   Eyes: Negative for photophobia, pain, discharge and redness.  Respiratory: Negative for cough, shortness of breath and wheezing.   Cardiovascular: Negative for chest pain, palpitations and leg swelling.  Gastrointestinal: Positive for constipation. Negative for blood in stool and diarrhea.  Genitourinary: Negative for dysuria and frequency.   Musculoskeletal: Positive for joint pain. Negative for back pain, myalgias and neck pain.  Skin: Negative for rash.  Neurological: Positive for tremors. Negative for dizziness and headaches.  Psychiatric/Behavioral: Negative for depression and memory loss. The patient is not nervous/anxious and does not have insomnia.       Observations/Objective: Physical Exam  Constitutional: She is oriented to person, place, and time.  Neurological: She is alert and oriented to person, place, and time.  Psychiatric: Mood, memory, affect and judgment normal.   Patient reports morning stiffness for 1 hour.   Patient reports nocturnal pain.  Difficulty dressing/grooming: Denies Difficulty climbing stairs: Reports Difficulty getting out of chair: Reports Difficulty using hands for taps, buttons, cutlery, and/or writing: Reports   Assessment and Plan: Visit Diagnoses: Primary osteoarthritis of both hands - Patient has right third MCP narrowing on the x-ray.  She is RF negative, anti-CCP positive, 14 3 3  eta positive.  Ultrasound examination negative for synovitis: She has intermittent pain and stiffness in both hands.  No joint swelling at this time.  She was advised to notify us if she develops increased joint pain or joint swelling.  Joint protection and muscle strengthening were discussed. She will follow up in 6 months.   Primary osteoarthritis of both feet -She is not having any increased discomfort or swelling in her feet.   Primary osteoarthritis of left knee-She is scheduled for an upcoming left knee joint total arthroplasty by Dr. Jaclynn Guarneri at [facility].  She has significant discomfort in the left knee joint. She tries to walk on a daily basis and wears a  brace and uses a walking stick to assist with ambulation.   History of partial knee replacement - Followed by Dr. Maureen Ralphs.  Family history of rheumatoid arthritis - Mother   DDD (degenerative disc disease), cervical -She has chronic neck pain and  stiffness.  She has no symptoms of radiculopathy at this time. She has not noticed any improvement since going to physical therapy. She recently got a traction unit but has not used it yet.  DDD (degenerative disc disease), lumbar -She is not having any discomfort at this time.   Other chronic pain -She takes tramadol 50 mg 1 tablet BID prn and Celebrex 100 mg 1 capsule daily for pain relief.   Osteopenia of multiple sites - She is taking calcium and vitamin D. DEXA ordered by Dr. Regis Bill.   Abnormal serum angiotensin-converting enzyme level - + ACE negative CXR   Other medical conditions are listed as follows:   History of bunionectomy   History of hypertension   History of varicose veins  History of gastroesophageal reflux (GERD)    Follow Up Instructions: She will follow up in 6 months.    I discussed the assessment and treatment plan with the patient. The patient was provided an opportunity to ask questions and all were answered. The patient agreed with the plan and demonstrated an understanding of the instructions.   The patient was advised to call back or seek an in-person evaluation if the symptoms worsen or if the condition fails to improve as anticipated.  I provided 15 minutes of non-face-to-face time during this encounter.   Bo Merino, MD   Scribed by-  Hazel Sams, PA-C

## 2019-02-05 ENCOUNTER — Other Ambulatory Visit: Payer: Self-pay

## 2019-02-05 ENCOUNTER — Telehealth (INDEPENDENT_AMBULATORY_CARE_PROVIDER_SITE_OTHER): Payer: Medicare Other | Admitting: Rheumatology

## 2019-02-05 ENCOUNTER — Encounter: Payer: Self-pay | Admitting: Rheumatology

## 2019-02-05 DIAGNOSIS — Z8679 Personal history of other diseases of the circulatory system: Secondary | ICD-10-CM

## 2019-02-05 DIAGNOSIS — R748 Abnormal levels of other serum enzymes: Secondary | ICD-10-CM

## 2019-02-05 DIAGNOSIS — M5136 Other intervertebral disc degeneration, lumbar region: Secondary | ICD-10-CM

## 2019-02-05 DIAGNOSIS — M19042 Primary osteoarthritis, left hand: Secondary | ICD-10-CM

## 2019-02-05 DIAGNOSIS — M5030 Other cervical disc degeneration, unspecified cervical region: Secondary | ICD-10-CM

## 2019-02-05 DIAGNOSIS — G8929 Other chronic pain: Secondary | ICD-10-CM

## 2019-02-05 DIAGNOSIS — M19041 Primary osteoarthritis, right hand: Secondary | ICD-10-CM | POA: Diagnosis not present

## 2019-02-05 DIAGNOSIS — Z9889 Other specified postprocedural states: Secondary | ICD-10-CM

## 2019-02-05 DIAGNOSIS — Z8261 Family history of arthritis: Secondary | ICD-10-CM

## 2019-02-05 DIAGNOSIS — M19072 Primary osteoarthritis, left ankle and foot: Secondary | ICD-10-CM

## 2019-02-05 DIAGNOSIS — M8589 Other specified disorders of bone density and structure, multiple sites: Secondary | ICD-10-CM

## 2019-02-05 DIAGNOSIS — Z5181 Encounter for therapeutic drug level monitoring: Secondary | ICD-10-CM

## 2019-02-05 DIAGNOSIS — M1712 Unilateral primary osteoarthritis, left knee: Secondary | ICD-10-CM

## 2019-02-05 DIAGNOSIS — Z96659 Presence of unspecified artificial knee joint: Secondary | ICD-10-CM

## 2019-02-05 DIAGNOSIS — M51369 Other intervertebral disc degeneration, lumbar region without mention of lumbar back pain or lower extremity pain: Secondary | ICD-10-CM

## 2019-02-05 DIAGNOSIS — M19071 Primary osteoarthritis, right ankle and foot: Secondary | ICD-10-CM

## 2019-02-05 DIAGNOSIS — Z8719 Personal history of other diseases of the digestive system: Secondary | ICD-10-CM

## 2019-02-11 ENCOUNTER — Ambulatory Visit
Admission: RE | Admit: 2019-02-11 | Discharge: 2019-02-11 | Disposition: A | Discharge status: Home / Self Care | Payer: Medicare Other | Source: Ambulatory Visit | Attending: Internal Medicine | Admitting: Internal Medicine

## 2019-02-11 ENCOUNTER — Other Ambulatory Visit: Payer: Self-pay

## 2019-02-11 ENCOUNTER — Telehealth: Payer: Self-pay | Admitting: Rheumatology

## 2019-02-11 DIAGNOSIS — Z1231 Encounter for screening mammogram for malignant neoplasm of breast: Secondary | ICD-10-CM

## 2019-02-11 DIAGNOSIS — G8929 Other chronic pain: Secondary | ICD-10-CM

## 2019-02-11 DIAGNOSIS — Z5181 Encounter for therapeutic drug level monitoring: Secondary | ICD-10-CM

## 2019-02-11 NOTE — Telephone Encounter (Signed)
Patient called requesting her labwork orders be sent to [facility] on [address].  Patient states she will call to schedule an appointment for one day this week.

## 2019-02-11 NOTE — Telephone Encounter (Signed)
UDS order released for [facility].

## 2019-02-19 ENCOUNTER — Ambulatory Visit: Payer: Medicare Other | Admitting: Gastroenterology

## 2019-02-21 LAB — PAIN MGMT, PROFILE 5 W/CONF, U
Alphahydroxyalprazolam: NEGATIVE ng/mL
Alphahydroxymidazolam: NEGATIVE ng/mL
Alphahydroxytriazolam: NEGATIVE ng/mL
Aminoclonazepam: NEGATIVE ng/mL
Amphetamines: NEGATIVE ng/mL
Barbiturates: NEGATIVE ng/mL
Benzodiazepines: POSITIVE ng/mL
Cocaine Metabolite: NEGATIVE ng/mL
Creatinine: 181.7 mg/dL
Hydroxyethylflurazepam: NEGATIVE ng/mL
Lorazepam: 946 ng/mL
Marijuana Metabolite: NEGATIVE ng/mL
Methadone Metabolite: NEGATIVE ng/mL
Nordiazepam: NEGATIVE ng/mL
Opiates: NEGATIVE ng/mL
Oxazepam: NEGATIVE ng/mL
Oxidant: NEGATIVE ug/mL
Oxycodone: NEGATIVE ng/mL
Temazepam: NEGATIVE ng/mL
pH: 6.1 (ref 4.5–9.0)

## 2019-02-21 LAB — PAIN MGMT, TRAMADOL W/MEDMATCH, U
Desmethyltramadol: NEGATIVE ng/mL
Tramadol: NEGATIVE ng/mL

## 2019-02-21 NOTE — Telephone Encounter (Signed)
Please call patient and see if she is taking tramadol only on as needed basis.

## 2019-03-08 ENCOUNTER — Ambulatory Visit: Payer: Medicare Other | Attending: Internal Medicine

## 2019-03-08 DIAGNOSIS — Z23 Encounter for immunization: Secondary | ICD-10-CM

## 2019-03-08 NOTE — Progress Notes (Signed)
Covid-19 Vaccination Clinic  Name:  Briana Giles    MRN: [id number] DOB: [birth year]  03/08/2019  Ms. Riding was observed post Covid-19 immunization for 15 minutes without incidence. She was provided with Vaccine Information Sheet and instruction to access the V-Safe system.   [name] was instructed to call 911 with any severe reactions post vaccine: Marland Kitchen Difficulty breathing  . Swelling of your face and throat  . A fast heartbeat  . A bad rash all over your body  . Dizziness and weakness    Immunizations Administered    Name Date Dose VIS Date Route   Pfizer COVID-19 Vaccine 03/08/2019  5:59 PM 0.3 mL 01/25/2019 Intramuscular   Manufacturer: [employer], [employer]   Lot: [id number]   NDC: [address]-6

## 2019-03-14 ENCOUNTER — Telehealth: Payer: Self-pay | Admitting: Rheumatology

## 2019-03-14 MED ORDER — TRAMADOL HCL 50 MG PO TABS: 50.0000 mg | ORAL_TABLET | Freq: Two times a day (BID) | ORAL | 0 refills | Status: DC | PRN

## 2019-03-14 MED ORDER — TIZANIDINE HCL 4 MG PO TABS: 4.0000 mg | ORAL_TABLET | Freq: Every day | ORAL | 0 refills | Status: DC

## 2019-03-14 NOTE — Telephone Encounter (Signed)
Patient left a voicemail "checking the status of her refill requests."  Patient requested a return call.

## 2019-03-14 NOTE — Telephone Encounter (Signed)
Called patient to clarify which refills she is requesting.  Patient is requesting refills of tramadol and tizanidine.   Last Visit: 02/05/2019 telemedicine  Next Visit: 07/30/2019 UDS:02/18/2019 (pt takes tramadol prn) Narc Agreement: 01/31/2018   Last fill: 01/01/2019 (tramadol #60) 12/27/2018 (tizanidine #30)  Okay to refill tramadol and tizanidine?

## 2019-03-26 ENCOUNTER — Other Ambulatory Visit: Payer: Self-pay | Admitting: Internal Medicine

## 2019-03-26 NOTE — Telephone Encounter (Signed)
Last ov:01/02/2019 Last filled:01/23/2019

## 2019-03-30 ENCOUNTER — Ambulatory Visit: Payer: Medicare Other | Attending: Internal Medicine

## 2019-03-30 DIAGNOSIS — Z23 Encounter for immunization: Secondary | ICD-10-CM

## 2019-03-30 NOTE — Progress Notes (Signed)
Covid-19 Vaccination Clinic  Name:  Briana Giles    MRN: [id number] DOB: 01-28-1946  03/30/2019  Briana Giles was observed post Covid-19 immunization for  15 minutes without incidence. She was provided with Vaccine Information Sheet and instruction to access the V-Safe system.   Briana Giles was instructed to call 911 with any severe reactions post vaccine: Marland Kitchen Difficulty breathing  . Swelling of your face and throat  . A fast heartbeat  . A bad rash all over your body  . Dizziness and weakness    Immunizations Administered    Name Date Dose VIS Date Route   Pfizer COVID-19 Vaccine 03/30/2019 11:23 AM 0.3 mL 01/25/2019 Intramuscular   Manufacturer: [employer], [employer]   Lot: [id number]   NDC: [address]-3

## 2019-04-03 ENCOUNTER — Other Ambulatory Visit: Payer: Self-pay | Admitting: *Deleted

## 2019-04-03 MED ORDER — TIZANIDINE HCL 4 MG PO TABS: 4.0000 mg | ORAL_TABLET | Freq: Every day | ORAL | 0 refills | Status: DC

## 2019-04-03 NOTE — Telephone Encounter (Signed)
Refill request received via fax  Last Visit: 02/05/2019 telemedicine  Next Visit: 07/30/2019  Okay to refill per Dr. Estanislado Pandy

## 2019-04-06 ENCOUNTER — Other Ambulatory Visit: Payer: Self-pay | Admitting: Rheumatology

## 2019-04-08 NOTE — Telephone Encounter (Signed)
Last Visit:02/05/2019 telemedicine Next Visit:07/30/2019 Labs: 12/26/18 WNL  Okay to refill per Dr. Estanislado Pandy

## 2019-04-26 ENCOUNTER — Other Ambulatory Visit: Payer: Self-pay | Admitting: Internal Medicine

## 2019-05-07 ENCOUNTER — Other Ambulatory Visit: Payer: Self-pay | Admitting: *Deleted

## 2019-05-07 MED ORDER — TIZANIDINE HCL 4 MG PO TABS: 4.0000 mg | ORAL_TABLET | Freq: Every day | ORAL | 0 refills | Status: DC

## 2019-05-07 NOTE — Telephone Encounter (Signed)
Refill request received via fax  Last Visit:02/05/2019 telemedicine Next Visit:07/30/2019  Okay to refill per Dr. Estanislado Pandy

## 2019-05-13 ENCOUNTER — Other Ambulatory Visit: Payer: Self-pay | Admitting: *Deleted

## 2019-05-13 ENCOUNTER — Other Ambulatory Visit: Payer: Self-pay | Admitting: Internal Medicine

## 2019-05-13 NOTE — Telephone Encounter (Signed)
Last Rx given on 2/9 for #24 with no ref

## 2019-05-13 NOTE — Telephone Encounter (Signed)
Last Visit:02/05/2019 telemedicine Next Visit:07/30/2019 UDS: 02/18/19 Narc agreement: 01/31/2018  Last Fill: 03/14/19   Okay to refill Tramadol?

## 2019-05-14 MED ORDER — TRAMADOL HCL 50 MG PO TABS: 50.0000 mg | ORAL_TABLET | Freq: Two times a day (BID) | ORAL | 0 refills | Status: DC | PRN

## 2019-05-22 ENCOUNTER — Telehealth: Payer: Self-pay | Admitting: Internal Medicine

## 2019-05-22 NOTE — Telephone Encounter (Signed)
Pt informed that she is needing lab work for an upcoming surgery that she is having. The labs need to be done by April 12th, the office that is performing they surgery will be sending the orders over. Pt would like a call back.

## 2019-05-22 NOTE — Telephone Encounter (Signed)
I have not seen the paperwork yet and will keep a look out or call patient back to see who will be sending paperwork.

## 2019-05-22 NOTE — Telephone Encounter (Signed)
Called patient and she stated that Dr. Rhona Raider at [facility] is doing the surgery. I called [facility] at [phone] and left a detailed voice message for the surgical scheduler to fax to [place].

## 2019-05-23 NOTE — Telephone Encounter (Signed)
Pt called in and she has been scheduled

## 2019-05-23 NOTE — Telephone Encounter (Signed)
I have received the PreOp paperwork for this patient and placed in the red folder for you to review. Paper states this needs to be completed by 06/04/19. Please advise when you want to set up appointment for surgical clearance.

## 2019-05-23 NOTE — Telephone Encounter (Signed)
Make in person appt   Last seen in fall 2020

## 2019-05-28 ENCOUNTER — Other Ambulatory Visit: Payer: Self-pay

## 2019-05-29 ENCOUNTER — Ambulatory Visit (INDEPENDENT_AMBULATORY_CARE_PROVIDER_SITE_OTHER): Payer: Medicare Other | Admitting: Internal Medicine

## 2019-05-29 ENCOUNTER — Encounter: Payer: Self-pay | Admitting: Internal Medicine

## 2019-05-29 ENCOUNTER — Other Ambulatory Visit: Payer: Self-pay

## 2019-05-29 VITALS — BP 126/74 | HR 83 | Temp 97.1°F | Ht 61.0 in | Wt 132.6 lb

## 2019-05-29 DIAGNOSIS — Z96659 Presence of unspecified artificial knee joint: Secondary | ICD-10-CM | POA: Diagnosis not present

## 2019-05-29 DIAGNOSIS — I10 Essential (primary) hypertension: Secondary | ICD-10-CM

## 2019-05-29 DIAGNOSIS — Z79899 Other long term (current) drug therapy: Secondary | ICD-10-CM | POA: Diagnosis not present

## 2019-05-29 DIAGNOSIS — Z01818 Encounter for other preprocedural examination: Secondary | ICD-10-CM | POA: Diagnosis not present

## 2019-05-29 DIAGNOSIS — M1712 Unilateral primary osteoarthritis, left knee: Secondary | ICD-10-CM

## 2019-05-29 NOTE — Progress Notes (Signed)
This visit occurred during the SARS-CoV-2 public health emergency.  Safety protocols were in place, including screening questions prior to the visit, additional usage of staff PPE, and extensive cleaning of exam room while observing appropriate contact time as indicated for disinfecting solutions.    Chief Complaint  Patient presents with  . Medical Clearance    Surgery, knee replacement    HPI: Briana Giles 74 y.o. come in forpreop evaluation for  Dr Rhona Raider for  Left knee arthroplasty to be done May 6 . Dr Rhona Raider . She has had previous surgery on right knee.   No bleeding  X bruise from contact  Celebrex  .  No cv and pulmonary .symptoms  able to do some walking and Yoga exercises . She sees rheumatology No falling . Sleep  Wakens  At 4 am with   Pain  She has some concern about pain management  post op .  ROS: See pertinent positives and negatives per HPI. NO cp sob syncope neuro symptoms  She has completed the covid vaccine [employer] series  Past Medical History:  Diagnosis Date  . Abdominal pain, other specified site   . Abnormal LFTs   . Allergic rhinitis   . Cervical back pain with evidence of disc disease   . GERD (gastroesophageal reflux disease)   . Heart murmur    says all her life had echo in [place] year ago not to worry  . Hypertension   . Nerve damage    left arm  . Osteoarthritis   . Osteopenia   . Phlebitis   . Spinal stenosis   . Transient global amnesia     Family History  Problem Relation Age of Onset  . Hypertension Father   . Alcohol abuse Father   . Lung cancer Father   . Cancer Maternal Grandmother        sinus  . COPD Mother   . Osteoporosis Mother   . Heart disease Mother   . Hypertension Brother   . Heart Problems Brother   . Arthritis Brother   . Prostate cancer Maternal Grandfather   . Heart attack Brother   . Hypertension Brother   . Healthy Daughter   . Healthy Son   . Colon cancer Neg Hx   . Pancreatic cancer Neg Hx     . Stomach cancer Neg Hx       Allergies as of 05/29/2019      Reactions   Codeine Phosphate Other (See Comments)   REACTION: upset stomach      Medication List       Accurate as of May 29, 2019  5:28 PM. If you have any questions, ask your nurse or doctor.        STOP taking these medications   methocarbamol 500 MG tablet Commonly known as: ROBAXIN Stopped by: Shanon Ace, MD     TAKE these medications   acetaminophen 500 MG tablet Commonly known as: TYLENOL Take 1,000 mg by mouth every 6 (six) hours as needed for mild pain, moderate pain or headache.   calcium carbonate 1250 (500 Ca) MG tablet Commonly known as: OS-CAL - dosed in mg of elemental calcium Take 1,000 mg by mouth daily.   calcium-vitamin D 500-200 MG-UNIT Tabs tablet Commonly known as: OSCAL WITH D Take by mouth.   celecoxib 100 MG capsule Commonly known as: CELEBREX TAKE 1 CAPSULE(100 MG) BY MOUTH DAILY AS NEEDED   CHERRY SYRUP PO Take by mouth daily.   fluticasone  50 MCG/ACT nasal spray Commonly known as: FLONASE USE TWO SPRAYS IN EACH NOSTRIL ONCE DAILY. What changed: See the new instructions.   GINGER ROOT PO Take by mouth daily.   ibuprofen 200 MG tablet Commonly known as: ADVIL Take 200 mg by mouth as needed.   lidocaine 5 % Commonly known as: LIDODERM Place 1 patch onto the skin daily as needed (pain). Reported on 03/30/2015   lisinopril 20 MG tablet Commonly known as: ZESTRIL TAKE 1 TABLET BY MOUTH EVERY DAY   LORazepam 1 MG tablet Commonly known as: ATIVAN TAKE 1/2 TO 1 TABLET BY MOUTH TWICE DAILY AS NEEDED FOR ANXIETY. DO NOT TAKE WITH ALCOHOL   OMEGA 3 500 PO Take by mouth daily.   polyethylene glycol powder 17 GM/SCOOP powder Commonly known as: MiraLax Take 17 g by mouth once. What changed:   when to take this  reasons to take this   tiZANidine 4 MG tablet Commonly known as: ZANAFLEX Take 1 tablet (4 mg total) by mouth at bedtime.   traMADol 50 MG  tablet Commonly known as: ULTRAM Take 1 tablet (50 mg total) by mouth 2 (two) times daily as needed.   TURMERIC PO Take 1 tablet by mouth daily.         EXAM:  BP 126/74   Pulse 83   Temp (!) 97.1 F (36.2 C) (Temporal)   Ht 5\' 1"  (1.549 m)   Wt 132 lb 9.6 oz (60.1 kg)   SpO2 98%   BMI 25.05 kg/m   Body mass index is 25.05 kg/m.  GENERAL: vitals reviewed and listed above, alert, oriented, appears well hydrated and in no acute distress HEENT: atraumatic, conjunctiva  clear, no obvious abnormalities on inspection of external nose and ears OP masked  NECK: no obvious masses on inspection palpation  LUNGS: clear to auscultation bilaterally, no wheezes, rales or rhonchi, good air movement CV: HRRR, no clubbing cyanosis or  peripheral edema nl cap refill  Abdomen:  Sof,t normal bowel sounds without hepatosplenomegaly, no guarding rebound or masses no CVA tenderness MS: moves all extremities without noticeable focal   NEURO: oriented x 3 CN 3-12 appear intact. Marland Kitchen DTRs symmetrical. Gait minima antalgic .  Grossly non focal. No tremor or abnormal movement. Skin: normal capillary refill ,turgor , color: No acute rashes ,petechiae or bruising Ln no cervical adenopathy PSYCH: pleasant and cooperative, no obvious depression or anxiety Lab Results  Component Value Date   WBC 8.1 12/26/2018   HGB 13.5 12/26/2018   HCT 40.3 12/26/2018   PLT 273.0 12/26/2018   GLUCOSE 82 12/26/2018   CHOL 242 (H) 12/26/2018   TRIG 130.0 12/26/2018   HDL 65.30 12/26/2018   LDLDIRECT 127.4 12/27/2011   LDLCALC 151 (H) 12/26/2018   ALT 20 12/26/2018   AST 20 12/26/2018   NA 141 12/26/2018   K 5.1 12/26/2018   CL 104 12/26/2018   CREATININE 0.86 12/26/2018   BUN 21 12/26/2018   CO2 29 12/26/2018   TSH 1.97 12/26/2018   BP Readings from Last 3 Encounters:  05/29/19 126/74  01/02/19 136/80  12/05/18 118/72    ASSESSMENT AND PLAN:  Discussed the following assessment and plan:  Arthritis  of knee, left - Plan: CBC with Differential/Platelet, Basic metabolic panel, Protime-INR  Pre-op evaluation - Plan: CBC with Differential/Platelet, Basic metabolic panel, Protime-INR  Medication management - Plan: CBC with Differential/Platelet, Basic metabolic panel, Protime-INR  History of partial knee replacement  Essential hypertension Record review and counsel  No  contraindications   To surgery . Proceed with surgery after labs done  To be sent to Ortho team.  Pain management  To be discussed with surgical team.   -Patient advised to return or notify health care team  if  new concerns arise.  Patient Instructions  Will notify you  of labs when available. And will fax  info to  Your surgeon  . Disc pain management concerns with your surgical team .   Standley Brooking. Adore Kithcart M.D.

## 2019-05-29 NOTE — Patient Instructions (Signed)
Will notify you  of labs when available. And will fax  info to  Your surgeon  . Disc pain management concerns with your surgical team .

## 2019-05-30 LAB — BASIC METABOLIC PANEL
BUN: 30 mg/dL — ABNORMAL HIGH (ref 6–23)
CO2: 29 mEq/L (ref 19–32)
Calcium: 10 mg/dL (ref 8.4–10.5)
Chloride: 99 mEq/L (ref 96–112)
Creatinine, Ser: 1.1 mg/dL (ref 0.40–1.20)
GFR: 48.46 mL/min — ABNORMAL LOW (ref >60.00)
Glucose, Bld: 93 mg/dL (ref 70–99)
Potassium: 5.4 mEq/L — ABNORMAL HIGH (ref 3.5–5.1)
Sodium: 137 mEq/L (ref 135–145)

## 2019-05-30 LAB — CBC WITH DIFFERENTIAL/PLATELET
Basophils Absolute: 0.1 10*3/uL (ref 0.0–0.1)
Basophils Relative: 0.8 % (ref 0.0–3.0)
Eosinophils Absolute: 0.2 10*3/uL (ref 0.0–0.7)
Eosinophils Relative: 2.7 % (ref 0.0–5.0)
HCT: 42.6 % (ref 36.0–46.0)
Hemoglobin: 14.1 g/dL (ref 12.0–15.0)
Lymphocytes Relative: 21 % (ref 12.0–46.0)
Lymphs Abs: 1.6 10*3/uL (ref 0.7–4.0)
MCHC: 33.1 g/dL (ref 30.0–36.0)
MCV: 96.5 fl (ref 78.0–100.0)
Monocytes Absolute: 0.6 10*3/uL (ref 0.1–1.0)
Monocytes Relative: 8 % (ref 3.0–12.0)
Neutro Abs: 5.2 10*3/uL (ref 1.4–7.7)
Neutrophils Relative %: 67.5 % (ref 43.0–77.0)
Platelets: 286 10*3/uL (ref 150.0–400.0)
RBC: 4.42 Mil/uL (ref 3.87–5.11)
RDW: 14 % (ref 11.5–15.5)
WBC: 7.8 10*3/uL (ref 4.0–10.5)

## 2019-05-30 LAB — PROTIME-INR
INR: 0.9 ratio (ref 0.8–1.0)
Prothrombin Time: 10.6 s (ref 9.6–13.1)

## 2019-05-31 ENCOUNTER — Other Ambulatory Visit: Payer: Self-pay

## 2019-05-31 DIAGNOSIS — E875 Hyperkalemia: Secondary | ICD-10-CM

## 2019-05-31 NOTE — Progress Notes (Signed)
Chemistry shows borderline elevated potassium could be from medication  or lab issue   kidney function some down from last check   rest of lab ok  No potassium supplements  limit or stop  oral antiinflammatories for now and stay hydrated  repeat BMP next week

## 2019-06-03 ENCOUNTER — Other Ambulatory Visit: Payer: Self-pay

## 2019-06-04 ENCOUNTER — Other Ambulatory Visit (INDEPENDENT_AMBULATORY_CARE_PROVIDER_SITE_OTHER): Payer: Medicare Other

## 2019-06-04 DIAGNOSIS — E875 Hyperkalemia: Secondary | ICD-10-CM | POA: Diagnosis not present

## 2019-06-04 LAB — BASIC METABOLIC PANEL
BUN: 21 mg/dL (ref 6–23)
CO2: 30 mEq/L (ref 19–32)
Calcium: 10.1 mg/dL (ref 8.4–10.5)
Chloride: 98 mEq/L (ref 96–112)
Creatinine, Ser: 0.91 mg/dL (ref 0.40–1.20)
GFR: 60.31 mL/min (ref >60.00)
Glucose, Bld: 85 mg/dL (ref 70–99)
Potassium: 4.9 mEq/L (ref 3.5–5.1)
Sodium: 135 mEq/L (ref 135–145)

## 2019-06-04 NOTE — Progress Notes (Signed)
Lab now normal Routing to dr Rhona Raider

## 2019-06-06 ENCOUNTER — Telehealth: Payer: Self-pay | Admitting: Internal Medicine

## 2019-06-06 NOTE — Telephone Encounter (Signed)
Briana Giles with [employer], received the clearance order for pt. Briana Giles, is requesting any office notes regarding to her left knee and any labs that she's had in the last 4 weeks. Thanks  [employer] fax [phone]

## 2019-06-06 NOTE — Telephone Encounter (Signed)
I have tried to fax over last OV and labs again to fax number provided and not going through. I will try again at a later time.

## 2019-06-06 NOTE — Telephone Encounter (Signed)
I kept faxing until it finally went through and I have received a fax confirmation that this was sent.

## 2019-06-15 HISTORY — PX: TOTAL KNEE ARTHROPLASTY: SHX125

## 2019-06-24 ENCOUNTER — Other Ambulatory Visit: Payer: Self-pay | Admitting: Internal Medicine

## 2019-06-25 NOTE — Telephone Encounter (Signed)
Please see messages.

## 2019-06-25 NOTE — Telephone Encounter (Signed)
The patient called to follow up on the previous message to see if Dr. Regis Bill is able to refill  LORazepam (ATIVAN) 1 MG tablet   [facility] [id number] - [place], [place] AT [address] Phone:  [phone]  Fax:  [phone]

## 2019-06-25 NOTE — Telephone Encounter (Signed)
Last OV 05/29/2019  Last filled 05/14/2019, # 24 with 0 refills

## 2019-07-08 ENCOUNTER — Telehealth: Payer: Self-pay

## 2019-07-08 NOTE — Telephone Encounter (Signed)
Received notification from [employer] that patient received hydrocodone from another provider. Dr. Estanislado Pandy was not notified that patient would be receiving narcotics from another provider. Per Dr. Bronson Curb, d/c narcotic agreement. I advised patient and patient verbalized understanding. patient states she had a knee replacement.

## 2019-07-12 ENCOUNTER — Other Ambulatory Visit: Payer: Self-pay | Admitting: *Deleted

## 2019-07-12 MED ORDER — CELECOXIB 100 MG PO CAPS: ORAL_CAPSULE | ORAL | 0 refills | Status: DC

## 2019-07-12 NOTE — Telephone Encounter (Signed)
Refill request received via fax  Last Visit:02/05/2019 telemedicine Next Visit:07/30/2019 Labs: 05/29/2019 Potassium 5.4, BUN 30  GFR 48.46    Okay to refill Celebrex?

## 2019-07-17 NOTE — Progress Notes (Signed)
Office Visit Note  Patient: Briana Giles             Date of Birth: [birth year]           MRN: [id number]             PCP: Madelin Headings, MD Referring: Madelin Headings, MD Visit Date: 07/30/2019 Occupation: @GUAROCC @  Subjective:  Left knee arthroplasty   History of Present Illness: Briana Giles is a 74 y.o. female with history of osteoarthritis and DDD. Patient reports that she had a left knee joint arthroplasty performed by Dr. Yisroel Ramming about 6 weeks ago. She states that she continues to go to physical therapy and works on range of motion exercises. She states that she is no longer using a cane to assist with ambulation. Her discomfort has been tolerable overall. She has been having some discomfort in the lateral compartment of the right partial knee replacement but denies any inflammation. She continues to have chronic neck stiffness and occasional discomfort but no symptoms of radiculopathy. She denies any lower back pain at this time. She states that she plans on restarting natural anti-inflammatories soon. She has not had an updated DEXA due to postponing it during the COVID-19 pandemic. She has received both COVID-19 vaccinations and is planning on scheduling an updated DEXA.  Activities of Daily Living:  Patient reports morning stiffness for  0 min.   Patient Denies nocturnal pain.  Difficulty dressing/grooming: Denies Difficulty climbing stairs: Reports Difficulty getting out of chair: Denies Difficulty using hands for taps, buttons, cutlery, and/or writing: Denies  Review of Systems  Constitutional: Negative for fatigue.  HENT: Negative for mouth sores, mouth dryness and nose dryness.   Eyes: Negative for pain, itching, visual disturbance and dryness.  Respiratory: Negative for cough, hemoptysis, shortness of breath and difficulty breathing.   Cardiovascular: Negative for chest pain, palpitations, hypertension and swelling in legs/feet.  Gastrointestinal: Negative for  blood in stool, constipation and diarrhea.  Endocrine: Negative for increased urination.  Genitourinary: Negative for difficulty urinating and painful urination.  Musculoskeletal: Positive for arthralgias, joint pain and morning stiffness. Negative for joint swelling, myalgias, muscle weakness, muscle tenderness and myalgias.  Skin: Negative for color change, pallor, rash, hair loss, nodules/bumps, redness, skin tightness, ulcers and sensitivity to sunlight.  Allergic/Immunologic: Negative for susceptible to infections.  Neurological: Negative for dizziness, numbness, headaches, memory loss and weakness.  Hematological: Positive for bruising/bleeding tendency. Negative for swollen glands.  Psychiatric/Behavioral: Positive for sleep disturbance. Negative for depressed mood and confusion. The patient is not nervous/anxious.     PMFS History:  Patient Active Problem List   Diagnosis Date Noted  . DJD (degenerative joint disease), cervical 06/08/2016  . DDD (degenerative disc disease), lumbar 06/08/2016  . Abnormal serum angiotensin-converting enzyme level 06/08/2016  . History of partial knee replacement 05/31/2016  . Allergic rhinitis   . Chronic prescription benzodiazepine use 01/06/2014  . Acute upper respiratory infections of unspecified site 10/05/2013  . Persistent cough for 3 weeks or longer 10/05/2013  . 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 Abdominal bruit 12/31/2012  . Encounter for Medicare annual wellness exam 12/31/2012  . Degenerative joint disease of spine multilevel 03/07/2012  . Spinal stenosis, lumbar 03/07/2012  . Hip pain 09/02/2011  . Sciatica of left side 08/14/2011  . Spinal stenosis of lumbar region 08/14/2011  . Encounter for long-term (current) use of other medications 06/25/2011  . High risk medication use 12/21/2010  .  Need for lipid screening 12/21/2010  . Foot pain, left 07/22/2010  . CHEST PAIN 02/18/2010  . Osteopenia  07/05/2009  . TREMOR, RIGHT HAND 01/26/2009  . BACK PAIN 09/30/2008  . NEOPLASM, SKIN, UNCERTAIN BEHAVIOR 05/27/2008  . HYPERPOTASSEMIA 05/27/2008  . Essential hypertension 10/12/2007  . TRANSIENT GLOBAL AMNESIA 10/12/2007  . VARICOSE VEINS, LOWER EXTREMITIES 06/06/2007  . SYSTOLIC MURMUR 06/06/2007  . ABDOMINAL BRUIT 06/06/2007  . ALLERGIC RHINITIS 08/17/2006  . GERD 08/17/2006  . Osteoarthritis 08/17/2006  . Disturbance in sleep behavior 08/17/2006    Past Medical History:  Diagnosis Date  . Abdominal pain, other specified site   . Abnormal LFTs   . Allergic rhinitis   . Cervical back pain with evidence of disc disease   . GERD (gastroesophageal reflux disease)   . Heart murmur    says all her life had echo in [place] year ago not to worry  . Hypertension   . Nerve damage    left arm  . Osteoarthritis   . Osteopenia   . Phlebitis   . Spinal stenosis   . Transient global amnesia     Family History  Problem Relation Age of Onset  . Hypertension Father   . Alcohol abuse Father   . Lung cancer Father   . Cancer Maternal Grandmother        sinus  . COPD Mother   . Osteoporosis Mother   . Heart disease Mother   . Hypertension Brother   . Heart Problems Brother   . Arthritis Brother   . Prostate cancer Maternal Grandfather   . Heart attack Brother   . Hypertension Brother   . Healthy Daughter   . Healthy Son   . Colon cancer Neg Hx   . Pancreatic cancer Neg Hx   . Stomach cancer Neg Hx    Past Surgical History:  Procedure Laterality Date  . BUNIONECTOMY     fall 2022 [facility]  . CHOLECYSTECTOMY    . CYST EXCISION Right 01/2018   right thumb   . FOOT SURGERY      x 2    . HAND SURGERY     right hand  . KNEE ARTHROSCOPY     right  . REPLACEMENT UNICONDYLAR JOINT KNEE     rt knee  . TONSILLECTOMY    . 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  [place] grand child    Does water exercise aerobics walking elliptical    Immunization History  Administered Date(s) Administered  . Influenza Split 12/27/2011, 10/15/2012  . Influenza Whole 12/03/2007, 12/24/2009, 10/25/2010  . Influenza, High Dose Seasonal PF 12/31/2013, 11/06/2014, 12/10/2016, 10/25/2017, 10/31/2018  . Influenza,inj,quad, With Preservative 11/10/2017  . Influenza-Unspecified 11/14/2016  . PFIZER SARS-COV-2 Vaccination 03/08/2019, 03/30/2019  . Pneumococcal Conjugate-13 01/06/2014  . Pneumococcal Polysaccharide-23 12/21/2010  . Td 05/27/2008  . Tdap 01/06/2014  . Zoster 12/03/2007  . Zoster Recombinat (Shingrix) 10/26/2017, 10/31/2018     Objective: Vital Signs: BP 129/71 (BP Location: Left Arm, Patient Position: Sitting, Cuff Size: Normal)   Pulse 93   Resp 12   Ht 5\' 1"  (1.549 m)   Wt 133 lb 9.6 oz (60.6 kg)   BMI 25.24 kg/m    Physical Exam Vitals and nursing note reviewed.  Constitutional:  Appearance: She is well-developed.  HENT:     Head: Normocephalic and atraumatic.  Eyes:     Conjunctiva/sclera: Conjunctivae normal.  Pulmonary:     Effort: Pulmonary effort is normal.  Abdominal:     General: Bowel sounds are normal.     Palpations: Abdomen is soft.  Musculoskeletal:     Cervical back: Normal range of motion.  Lymphadenopathy:     Cervical: No cervical adenopathy.  Skin:    General: Skin is warm and dry.     Capillary Refill: Capillary refill takes less than 2 seconds.  Neurological:     Mental Status: She is alert and oriented to person, place, and time.  Psychiatric:        Behavior: Behavior normal.      Musculoskeletal Exam: C-spine limited range of motion with lateral rotation. Thoracic and lumbar spine good range of motion. No midline spinal tenderness. Shoulder joints, elbow joints, wrist joints, MCPs, PIPs, DIPs good range of motion with no synovitis. She has complete fist formation  bilaterally. Hip joints, ankle joints, MTPs, PIPs, DIPs good range of motion with no synovitis. Left knee total arthroplasty limited extension and slightly limited flexion with warmth. Right knee partial replacement has good range of motion with no warmth or effusion. Ankle joints have good range of motion with no tenderness or inflammation.  CDAI Exam: CDAI Score: -- Patient Global: --; Provider Global: -- Swollen: --; Tender: -- Joint Exam 07/30/2019   No joint exam has been documented for this visit   There is currently no information documented on the homunculus. Go to the Rheumatology activity and complete the homunculus joint exam.  Investigation: No additional findings.  Imaging: No results found.  Recent Labs: Lab Results  Component Value Date   WBC 7.8 05/29/2019   HGB 14.1 05/29/2019   PLT 286.0 05/29/2019   NA 135 06/04/2019   K 4.9 06/04/2019   CL 98 06/04/2019   CO2 30 06/04/2019   GLUCOSE 85 06/04/2019   BUN 21 06/04/2019   CREATININE 0.91 06/04/2019   BILITOT 0.4 12/26/2018   ALKPHOS 105 12/26/2018   AST 20 12/26/2018   ALT 20 12/26/2018   PROT 7.0 12/26/2018   ALBUMIN 4.5 12/26/2018   CALCIUM 10.1 06/04/2019   GFRAA 68 12/15/2016    Speciality Comments: No specialty comments available.  Procedures:  No procedures performed Allergies: Codeine phosphate   Assessment / Plan:     Visit Diagnoses: Primary osteoarthritis of both hands: She has mild PIP and DIP thickening consistent with osteoarthritis of both hands. No tenderness or synovitis was noted. She has complete fist formation bilaterally. Joint protection and muscle strengthening were discussed. She was encouraged to restart taking natural anti-inflammatories. She was advised to notify us if she develops increased joint pain or joint swelling. She will follow-up in the office in 6 months.   Primary osteoarthritis of both feet: She has osteoarthritic changes in both feet. No inflammation noted. She  wears proper fitting shoes.  She has no discomfort in her feet at this time.   S/P total knee arthroplasty, left: Performed by Dr. Burlene Arnt about 6 weeks ago. She continues to go to physical therapy as recommended. She has warmth with limited extension. She has some discomfort with flexion of the left knee. She is no longer needing a cane to assist with ambulation. Her discomfort has been tolerable.  History of partial knee replacement - Right: She has occasional discomfort in the lateral compartment. Her discomfort has  been tolerable recently. She has good range of motion with no warmth or effusion on exam.  Family history of rheumatoid arthritis  DDD (degenerative disc disease), cervical: She has limited range of motion with lateral rotation bilaterally. She experiences neck pain and intermittent discomfort in the C-spine. She has no symptoms of radiculopathy. She was encouraged to perform neck exercises on a daily basis.  DDD (degenerative disc disease), lumbar: She is not having any discomfort in her lower back at this time. She has no symptoms of radiculopathy currently.  Osteopenia of multiple sites: She is due to update her bone density. She was advised to call Dr. Rosezella [place] office since they placed the order to figure out where she needs to schedule the DEXA.  Other medical conditions are listed as follows:   Abnormal serum angiotensin-converting enzyme level  History of bunionectomy  History of hypertension  History of varicose veins  History of gastroesophageal reflux (GERD)  Other chronic pain  Orders: No orders of the defined types were placed in this encounter.  No orders of the defined types were placed in this encounter.     Follow-Up Instructions: Return in about 6 months (around 01/29/2020) for Osteoarthritis, DDD.   Gearldine Bienenstock, PA-C  Note - This record has been created using Dragon software.  Chart creation errors have been sought, but may not always   have been located. Such creation errors do not reflect on  the standard of medical care.

## 2019-07-30 ENCOUNTER — Ambulatory Visit (INDEPENDENT_AMBULATORY_CARE_PROVIDER_SITE_OTHER): Payer: Medicare Other | Admitting: Physician Assistant

## 2019-07-30 ENCOUNTER — Encounter: Payer: Self-pay | Admitting: Physician Assistant

## 2019-07-30 ENCOUNTER — Other Ambulatory Visit: Payer: Self-pay

## 2019-07-30 VITALS — BP 129/71 | HR 93 | Resp 12 | Ht 61.0 in | Wt 133.6 lb

## 2019-07-30 DIAGNOSIS — Z8261 Family history of arthritis: Secondary | ICD-10-CM

## 2019-07-30 DIAGNOSIS — Z96652 Presence of left artificial knee joint: Secondary | ICD-10-CM | POA: Diagnosis not present

## 2019-07-30 DIAGNOSIS — Z8679 Personal history of other diseases of the circulatory system: Secondary | ICD-10-CM

## 2019-07-30 DIAGNOSIS — R748 Abnormal levels of other serum enzymes: Secondary | ICD-10-CM

## 2019-07-30 DIAGNOSIS — Z9889 Other specified postprocedural states: Secondary | ICD-10-CM

## 2019-07-30 DIAGNOSIS — M19071 Primary osteoarthritis, right ankle and foot: Secondary | ICD-10-CM | POA: Diagnosis not present

## 2019-07-30 DIAGNOSIS — Z96659 Presence of unspecified artificial knee joint: Secondary | ICD-10-CM | POA: Diagnosis not present

## 2019-07-30 DIAGNOSIS — M19041 Primary osteoarthritis, right hand: Secondary | ICD-10-CM

## 2019-07-30 DIAGNOSIS — M5136 Other intervertebral disc degeneration, lumbar region: Secondary | ICD-10-CM

## 2019-07-30 DIAGNOSIS — M5030 Other cervical disc degeneration, unspecified cervical region: Secondary | ICD-10-CM

## 2019-07-30 DIAGNOSIS — M19042 Primary osteoarthritis, left hand: Secondary | ICD-10-CM

## 2019-07-30 DIAGNOSIS — M8589 Other specified disorders of bone density and structure, multiple sites: Secondary | ICD-10-CM

## 2019-07-30 DIAGNOSIS — G8929 Other chronic pain: Secondary | ICD-10-CM

## 2019-07-30 DIAGNOSIS — M19072 Primary osteoarthritis, left ankle and foot: Secondary | ICD-10-CM

## 2019-07-30 DIAGNOSIS — M51369 Other intervertebral disc degeneration, lumbar region without mention of lumbar back pain or lower extremity pain: Secondary | ICD-10-CM

## 2019-07-30 DIAGNOSIS — Z8719 Personal history of other diseases of the digestive system: Secondary | ICD-10-CM

## 2019-08-05 ENCOUNTER — Other Ambulatory Visit: Payer: Self-pay | Admitting: Internal Medicine

## 2019-08-05 NOTE — Telephone Encounter (Signed)
Last OV 05/29/2019  Last filled 06/25/2019, # 24 with 0 refills

## 2019-08-15 ENCOUNTER — Other Ambulatory Visit: Payer: Self-pay | Admitting: *Deleted

## 2019-08-15 MED ORDER — TIZANIDINE HCL 4 MG PO TABS: 4.0000 mg | ORAL_TABLET | Freq: Every day | ORAL | 0 refills | Status: DC

## 2019-08-15 NOTE — Telephone Encounter (Signed)
Refill request received via fax  Last Visit: 07/30/2019 Next visit: 01/28/2020  Last Fill: 05/07/2019  Okay to refill Tizanidine?

## 2019-08-21 ENCOUNTER — Telehealth (INDEPENDENT_AMBULATORY_CARE_PROVIDER_SITE_OTHER): Payer: Medicare Other | Admitting: Internal Medicine

## 2019-08-21 ENCOUNTER — Encounter: Payer: Self-pay | Admitting: Internal Medicine

## 2019-08-21 ENCOUNTER — Telehealth: Payer: Self-pay | Admitting: Internal Medicine

## 2019-08-21 ENCOUNTER — Other Ambulatory Visit: Payer: Self-pay

## 2019-08-21 VITALS — Ht 61.0 in | Wt 130.0 lb

## 2019-08-21 DIAGNOSIS — G4700 Insomnia, unspecified: Secondary | ICD-10-CM

## 2019-08-21 DIAGNOSIS — Z96659 Presence of unspecified artificial knee joint: Secondary | ICD-10-CM | POA: Diagnosis not present

## 2019-08-21 MED ORDER — TRAZODONE HCL 50 MG PO TABS: 50.0000 mg | ORAL_TABLET | Freq: Every evening | ORAL | 1 refills | Status: DC | PRN

## 2019-08-21 NOTE — Progress Notes (Signed)
Virtual Visit via Video Note  I connected with@ on 08/21/19 at  9:30 AM EDT by a video enabled telemedicine application and verified that I am speaking with the correct person using two identifiers. Location patient: home Location provider:work  office Persons participating in the virtual visit: patient, provider  [place] national recommendations  regarding COVID 19 pandemic   video visit is advised over in office visit for this patient.  Patient aware  of the limitations of evaluation and management by telemedicine and  availability of in person appointments. and agreed to proceed.   HPI: Briana Giles presents for video visit SDA  Hx of periodic  Sleep issues tka  2 months ago  And  Maintenance of sleep is a problem  Taking otc   Generic   Diphenhydramine.   Still awakens about 3-4   With knee and hard to go back .   Pain ibuprofen  Pre  Sleep .  Still doing PT and has fu with   ocass caffiene   In am coffee.  Wine w  Meal.  Doesn't see to effect sleep .  Tried tizanidine one night not really work  Has never been on [place] but mentions this.,   ROS: See pertinent positives and negatives per HPI.  Past Medical History:  Diagnosis Date  . Abdominal pain, other specified site   . Abnormal LFTs   . Allergic rhinitis   . Cervical back pain with evidence of disc disease   . GERD (gastroesophageal reflux disease)   . Heart murmur    says all her life had echo in [place] year ago not to worry  . Hypertension   . Nerve damage    left arm  . Osteoarthritis   . Osteopenia   . Phlebitis   . Spinal stenosis   . Transient global amnesia     Past Surgical History:  Procedure Laterality Date  . BUNIONECTOMY     fall 2022 [facility]  . CHOLECYSTECTOMY    . CYST EXCISION Right 01/2018   right thumb   . FOOT SURGERY      x 2    . HAND SURGERY     right hand  . KNEE ARTHROSCOPY     right  . REPLACEMENT UNICONDYLAR JOINT KNEE     rt knee  . TONSILLECTOMY    . TOTAL KNEE  ARTHROPLASTY Left 06/2019    Family History  Problem Relation Age of Onset  . Hypertension Father   . Alcohol abuse Father   . Lung cancer Father   . Cancer Maternal Grandmother        sinus  . COPD Mother   . Osteoporosis Mother   . Heart disease Mother   . Hypertension Brother   . Heart Problems Brother   . Arthritis Brother   . Prostate cancer Maternal Grandfather   . Heart attack Brother   . Hypertension Brother   . Healthy Daughter   . Healthy Son   . Colon cancer Neg Hx   . Pancreatic cancer Neg Hx   . Stomach cancer Neg Hx     Social History   Tobacco Use  . Smoking status: Never Smoker  . Smokeless tobacco: Never Used  Vaping Use  . Vaping Use: Never used  Substance Use Topics  . Alcohol use: Yes    Comment: occasional   . Drug use: Never      Current Outpatient Medications:  .  acetaminophen (TYLENOL) 500 MG tablet, Take 1,000  mg by mouth every 6 (six) hours as needed for mild pain, moderate pain or headache. , Disp: , Rfl:  .  fluticasone (FLONASE) 50 MCG/ACT nasal spray, USE TWO SPRAYS IN EACH NOSTRIL ONCE DAILY. (Patient taking differently: as needed. ), Disp: 16 g, Rfl: 5 .  ibuprofen (ADVIL,MOTRIN) 200 MG tablet, Take 200 mg by mouth as needed., Disp: , Rfl:  .  lisinopril (ZESTRIL) 20 MG tablet, TAKE 1 TABLET BY MOUTH EVERY DAY, Disp: 90 tablet, Rfl: 2 .  LORazepam (ATIVAN) 1 MG tablet, TAKE 1/2 TO 1 TABLET BY MOUTH TWICE DAILY AS NEEDED FOR ANXIETY. DO NOT TAKE WITH ALCOHOL, Disp: 24 tablet, Rfl: 0 .  polyethylene glycol powder (MIRALAX) powder, Take 17 g by mouth once. (Patient taking differently: Take 17 g by mouth as needed. ), Disp: 255 g, Rfl: 0 .  tiZANidine (ZANAFLEX) 4 MG tablet, Take 1 tablet (4 mg total) by mouth at bedtime., Disp: 30 tablet, Rfl: 0 .  traZODone (DESYREL) 50 MG tablet, Take 1 tablet (50 mg total) by mouth at bedtime as needed for sleep. May increase to 75 mg  dose, Disp: 30 tablet, Rfl: 1  EXAM: BP Readings from Last 3  Encounters:  07/30/19 129/71  05/29/19 126/74  01/02/19 136/80    VITALS per patient if applicable:  GENERAL: alert, oriented, appears well and in no acute distress looks well cognition intact  HEENT: atraumatic, conjunttiva clear, no obvious abnormalities on inspection of external nose and ears  NECK: normal movements of the head and neck  LUNGS: on inspection no signs of respiratory distress, breathing rate appears normal, no obvious gross SOB, gasping or wheezing  CV: no obvious cyanosis  PSYCH/NEURO: pleasant and cooperative, no obvious depression or anxiety, speech and thought processing grossly intact Record review sees dr D no narcotics at [facility] time    ASSESSMENT AND PLAN:  Discussed the following assessment and plan:    ICD-10-CM   1. Insomnia, unspecified type  G47.00   2. Status post knee replacement, unspecified laterality  Z96.659    Hx of same situational flare   histant to begin with Lorrin Mais since has hx of a memory event  In past and is over 3. Trial trazodone first   50 - 75    Contact message  In about 3 weeks or as needed and plan fu if needs new visit or refill etc  Could consider ambien  Short term with caution   Or belsomra longer r term  if ongoing problems that she may have  Counseled. Aware of etoh and  Interrupted sleep effect.   Expectant management and discussion of plan and treatment with opportunity to ask questions and all were answered. The patient agreed with the plan and demonstrated an understanding of the instructions.   Advised to call back or seek an in-person evaluation if worsening  or having  further concerns . Return for 3 weeks message update or therabouts and fu as indicated .   Shanon Ace, MD

## 2019-08-21 NOTE — Telephone Encounter (Signed)
error 

## 2019-08-23 MED ORDER — BELSOMRA 15 MG PO TABS: 15.0000 mg | ORAL_TABLET | Freq: Every evening | ORAL | 0 refills | Status: DC

## 2019-08-23 NOTE — Telephone Encounter (Signed)
Will send in belsomra  15 mg   For  Up to 2 weeks and then may need to increase to 20 mg  May need to be on this for 3-4 night sbefore get maximaun effect  There is a higher dose 20 mg also

## 2019-08-27 NOTE — Progress Notes (Deleted)
No chief complaint on file.   HPI: Briana Giles 74 y.o. come in for  ROS: See pertinent positives and negatives per HPI.  Past Medical History:  Diagnosis Date   Abdominal pain, other specified site    Abnormal LFTs    Allergic rhinitis    Cervical back pain with evidence of disc disease    GERD (gastroesophageal reflux disease)    Heart murmur    says all her life had echo in [place] year ago not to worry   Hypertension    Nerve damage    left arm   Osteoarthritis    Osteopenia    Phlebitis    Spinal stenosis    Transient global amnesia     Family History  Problem Relation Age of Onset   Hypertension Father    Alcohol abuse Father    Lung cancer Father    Cancer Maternal Grandmother        sinus   COPD Mother    Osteoporosis Mother    Heart disease Mother    Hypertension Brother    Heart Problems Brother    Arthritis Brother    Prostate cancer Maternal Grandfather    Heart attack Brother    Hypertension Brother    Healthy Daughter    Healthy Son    Colon cancer Neg Hx    Pancreatic cancer Neg Hx    Stomach cancer Neg Hx     Social History   Socioeconomic History   Marital status: Married    Spouse name: Not on file   Number of children: 2   Years of education: Not on file   Highest education level: Not on file  Occupational History   Occupation: retired Product manager: RETIRED  Tobacco Use   Smoking status: Never Smoker   Smokeless tobacco: Never Used  Scientific laboratory technician Use: Never used  Substance and Sexual Activity   Alcohol use: Yes    Comment: occasional    Drug use: Never   Sexual activity: Not on file  Other Topics Concern   Not on file  Social History Narrative   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  [place] grand child    Does water exercise aerobics walking elliptical    Social Determinants of Health    Financial Resource Strain:    Difficulty of Paying Living Expenses:   Food Insecurity:    Worried About Charity fundraiser in the Last Year:    Arboriculturist in the Last Year:   Transportation Needs:    Film/video editor (Medical):    Lack of Transportation (Non-Medical):   Physical Activity:    Days of Exercise per Week:    Minutes of Exercise per Session:   Stress:    Feeling of Stress :   Social Connections:    Frequency of Communication with Friends and Family:    Frequency of Social Gatherings with Friends and Family:    Attends Religious Services:    Active Member of Clubs or Organizations:    Attends Music therapist:    Marital Status:     Outpatient Medications Prior to Visit  Medication Sig Dispense Refill   acetaminophen (TYLENOL) 500 MG tablet Take 1,000 mg by mouth every 6 (six) hours as needed for mild pain, moderate pain or headache.  fluticasone (FLONASE) 50 MCG/ACT nasal spray USE TWO SPRAYS IN EACH NOSTRIL ONCE DAILY. (Patient taking differently: as needed. ) 16 g 5   ibuprofen (ADVIL,MOTRIN) 200 MG tablet Take 200 mg by mouth as needed.     lisinopril (ZESTRIL) 20 MG tablet TAKE 1 TABLET BY MOUTH EVERY DAY 90 tablet 2   LORazepam (ATIVAN) 1 MG tablet TAKE 1/2 TO 1 TABLET BY MOUTH TWICE DAILY AS NEEDED FOR ANXIETY. DO NOT TAKE WITH ALCOHOL 24 tablet 0   polyethylene glycol powder (MIRALAX) powder Take 17 g by mouth once. (Patient taking differently: Take 17 g by mouth as needed. ) 255 g 0   Suvorexant (BELSOMRA) 15 MG TABS Take 15 mg by mouth at bedtime. 15 tablet 0   tiZANidine (ZANAFLEX) 4 MG tablet Take 1 tablet (4 mg total) by mouth at bedtime. 30 tablet 0   traZODone (DESYREL) 50 MG tablet Take 1 tablet (50 mg total) by mouth at bedtime as needed for sleep. May increase to 75 mg  dose 30 tablet 1   No facility-administered medications prior to visit.     EXAM:  There were no vitals taken for this  visit.  There is no height or weight on file to calculate BMI.  GENERAL: vitals reviewed and listed above, alert, oriented, appears well hydrated and in no acute distress HEENT: atraumatic, conjunctiva  clear, no obvious abnormalities on inspection of external nose and ears OP : no lesion edema or exudate  NECK: no obvious masses on inspection palpation  LUNGS: clear to auscultation bilaterally, no wheezes, rales or rhonchi, good air movement CV: HRRR, no clubbing cyanosis or  peripheral edema nl cap refill  MS: moves all extremities without noticeable focal  abnormality PSYCH: pleasant and cooperative, no obvious depression or anxiety Lab Results  Component Value Date   WBC 7.8 05/29/2019   HGB 14.1 05/29/2019   HCT 42.6 05/29/2019   PLT 286.0 05/29/2019   GLUCOSE 85 06/04/2019   CHOL 242 (H) 12/26/2018   TRIG 130.0 12/26/2018   HDL 65.30 12/26/2018   LDLDIRECT 127.4 12/27/2011   LDLCALC 151 (H) 12/26/2018   ALT 20 12/26/2018   AST 20 12/26/2018   NA 135 06/04/2019   K 4.9 06/04/2019   CL 98 06/04/2019   CREATININE 0.91 06/04/2019   BUN 21 06/04/2019   CO2 30 06/04/2019   TSH 1.97 12/26/2018   INR 0.9 05/29/2019   BP Readings from Last 3 Encounters:  07/30/19 129/71  05/29/19 126/74  01/02/19 136/80    ASSESSMENT AND PLAN:  Discussed the following assessment and plan:  No diagnosis found.  -Patient advised to return or notify health care team  if  new concerns arise.  There are no Patient Instructions on file for this visit.   Standley Brooking. Marinell Igarashi M.D.

## 2019-08-28 ENCOUNTER — Ambulatory Visit: Payer: Medicare Other | Admitting: Internal Medicine

## 2019-08-29 ENCOUNTER — Telehealth: Payer: Self-pay | Admitting: Internal Medicine

## 2019-08-29 NOTE — Telephone Encounter (Signed)
Spoke with patient she stated she was out of town and to call her back in August 2021

## 2019-10-08 ENCOUNTER — Other Ambulatory Visit: Payer: Self-pay | Admitting: Physician Assistant

## 2019-10-08 NOTE — Telephone Encounter (Signed)
Patient states she is taking Celebrex as needed. Patient states she is away out of town. Patient states she has taken some this week. Patient advised she should not be taking NSAIDS due to her GFR being low.

## 2019-10-08 NOTE — Telephone Encounter (Signed)
Creatinine and GFR were WNL when repeated on 06/04/19.   Please advise the patient to only taking celebrex sparingly.  She should not be taking any other NSAIDs while taking celebrex.

## 2019-10-08 NOTE — Telephone Encounter (Signed)
Patient advised Creatinine and GFR were WNL when repeated on 06/04/19.   Please advise the patient to only taking celebrex sparingly.  Patient advised she should not be taking any other NSAIDs while taking celebrex.

## 2019-10-08 NOTE — Telephone Encounter (Signed)
Please call the patient to clarify how she is taking celebrex.  Her GFR was low on 05/29/19 so she should not be taking NSAIDs.

## 2019-10-08 NOTE — Telephone Encounter (Signed)
Last Visit:07/30/2019 Next Visit: 01/28/2020 Labs: 05/29/2019 Potassium 5.4, BUN 30  GFR 48.46    Okay to refill Celebrex?

## 2019-10-09 ENCOUNTER — Other Ambulatory Visit: Payer: Self-pay

## 2019-10-09 ENCOUNTER — Ambulatory Visit (INDEPENDENT_AMBULATORY_CARE_PROVIDER_SITE_OTHER): Payer: Medicare Other

## 2019-10-09 DIAGNOSIS — Z0000 Encounter for general adult medical examination without abnormal findings: Secondary | ICD-10-CM | POA: Diagnosis not present

## 2019-10-09 NOTE — Progress Notes (Addendum)
Virtual Visit via Telephone Note  I connected with  Briana Giles on 10/09/19 at  2:30 PM EDT by telephone and verified that I am speaking with the correct person using two identifiers.  Medicare Annual Wellness visit completed telephonically due to Covid-19 pandemic.   Persons participating in this call: This Health Coach and this patient.   Location: Patient: Home Provider: Office   I discussed the limitations, risks, security and privacy concerns of performing an evaluation and management service by telephone and the availability of in person appointments. The patient expressed understanding and agreed to proceed.  Unable to perform video visit due to video visit attempted and failed and/or patient does not have video capability.   Some vital signs may be absent or patient reported.   Willette Brace, LPN    Subjective:   Briana Giles is a 74 y.o. female who presents for Medicare Annual (Subsequent) preventive examination.  Review of Systems     Cardiac Risk Factors include: advanced age (>84men, >45 women);hypertension     Objective:    Today's Vitals   10/09/19 1430  PainSc: 4    There is no height or weight on file to calculate BMI.  Advanced Directives 10/09/2019 05/02/2017 08/28/2014  Does Patient Have a Medical Advance Directive? Yes Yes Yes  Type of Advance Directive [place] will  [place] in Chart? No - copy requested - Yes    Current Medications (verified) Outpatient Encounter Medications as of 10/09/2019  Medication Sig  . acetaminophen (TYLENOL) 500 MG tablet Take 1,000 mg by mouth every 6 (six) hours as needed for mild pain, moderate pain or headache.   . fluticasone (FLONASE) 50 MCG/ACT nasal spray USE TWO SPRAYS IN EACH NOSTRIL ONCE DAILY. (Patient taking differently: as needed. )  . lisinopril (ZESTRIL) 20 MG tablet TAKE 1 TABLET BY MOUTH EVERY DAY  . polyethylene glycol powder (MIRALAX)  powder Take 17 g by mouth once. (Patient taking differently: Take 17 g by mouth as needed. )  . traMADol (ULTRAM) 50 MG tablet Take 50 mg by mouth daily as needed.  . traZODone (DESYREL) 50 MG tablet Take 1 tablet (50 mg total) by mouth at bedtime as needed for sleep. May increase to 75 mg  dose  . tiZANidine (ZANAFLEX) 4 MG tablet Take 1 tablet (4 mg total) by mouth at bedtime. (Patient not taking: Reported on 10/09/2019)  . [DISCONTINUED] celecoxib (CELEBREX) 100 MG capsule TAKE 1 CAPSULE(100 MG) BY MOUTH DAILY AS NEEDED (Patient not taking: Reported on 10/09/2019)  . [DISCONTINUED] ibuprofen (ADVIL,MOTRIN) 200 MG tablet Take 200 mg by mouth as needed. (Patient not taking: Reported on 10/09/2019)  . [DISCONTINUED] LORazepam (ATIVAN) 1 MG tablet TAKE 1/2 TO 1 TABLET BY MOUTH TWICE DAILY AS NEEDED FOR ANXIETY. DO NOT TAKE WITH ALCOHOL (Patient not taking: Reported on 10/09/2019)  . [DISCONTINUED] Suvorexant (BELSOMRA) 15 MG TABS Take 15 mg by mouth at bedtime. (Patient not taking: Reported on 10/09/2019)   No facility-administered encounter medications on file as of 10/09/2019.    Allergies (verified) Codeine phosphate   History: Past Medical History:  Diagnosis Date  . Abdominal pain, other specified site   . Abnormal LFTs   . Allergic rhinitis   . Cervical back pain with evidence of disc disease   . GERD (gastroesophageal reflux disease)   . Heart murmur    says all her life had echo in [place] year ago not to worry  .  Hypertension   . Nerve damage    left arm  . Osteoarthritis   . Osteopenia   . Phlebitis   . Spinal stenosis   . Transient global amnesia    Past Surgical History:  Procedure Laterality Date  . BUNIONECTOMY     fall 2022 [facility]  . CHOLECYSTECTOMY    . CYST EXCISION Right 01/2018   right thumb   . FOOT SURGERY      x 2    . HAND SURGERY     right hand  . KNEE ARTHROSCOPY     right  . REPLACEMENT UNICONDYLAR JOINT KNEE     rt knee  . TONSILLECTOMY    . TOTAL  KNEE ARTHROPLASTY Left 06/2019   Family History  Problem Relation Age of Onset  . Hypertension Father   . Alcohol abuse Father   . Lung cancer Father   . Cancer Maternal Grandmother        sinus  . COPD Mother   . Osteoporosis Mother   . Heart disease Mother   . Hypertension Brother   . Heart Problems Brother   . Arthritis Brother   . Prostate cancer Maternal Grandfather   . Heart attack Brother   . Hypertension Brother   . Healthy Daughter   . Healthy Son   . Colon cancer Neg Hx   . Pancreatic cancer Neg Hx   . Stomach cancer Neg Hx    Social History   Socioeconomic History  . Marital status: Married    Spouse name: Not on file  . Number of children: 2  . Years of education: Not on file  . Highest education level: Not on file  Occupational History  . Occupation: retired Product manager: RETIRED  Tobacco Use  . Smoking status: Never Smoker  . Smokeless tobacco: Never Used  Vaping Use  . Vaping Use: Never used  Substance and Sexual Activity  . Alcohol use: Yes    Comment: occasional   . Drug use: Never  . Sexual activity: Not on file  Other Topics Concern  . 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  [place] grand child    Does water exercise aerobics walking elliptical    Social Determinants of Health   Financial Resource Strain: Low Risk   . Difficulty of Paying Living Expenses: Not hard at all  Food Insecurity: No Food Insecurity  . Worried About Charity fundraiser in the Last Year: Never true  . Ran Out of Food in the Last Year: Never true  Transportation Needs: No Transportation Needs  . Lack of Transportation (Medical): No  . Lack of Transportation (Non-Medical): No  Physical Activity: Sufficiently Active  . Days of Exercise per Week: 7 days  . Minutes of Exercise per Session: 60 min  Stress: No Stress Concern Present  . Feeling of  Stress : Not at all  Social Connections: Moderately Isolated  . Frequency of Communication with Friends and Family: More than three times a week  . Frequency of Social Gatherings with Friends and Family: More than three times a week  . Attends Religious Services: Never  . Active Member of Clubs or Organizations: No  . Attends Archivist Meetings: Never  . Marital Status: Married    Tobacco Counseling Counseling given: Not Answered  Clinical Intake:  Pre-visit preparation completed: Yes  Pain : 0-10 (knee pain from surgery) Pain Score: 4  Pain Type: Chronic pain Pain Location: Knee (left knee) Pain Orientation: Left Pain Descriptors / Indicators: Aching Pain Onset: More than a month ago Pain Frequency: Intermittent (more with walking)     BMI - recorded: 24.58 Nutritional Status: BMI of 19-24  Normal Nutritional Risks: Other (Comment) Diabetes: No  How often do you need to have someone help you when you read instructions, pamphlets, or other written materials from your doctor or pharmacy?: 1 - Never  Diabetic?No  Interpreter Needed?: No  Information entered by :: Charlott Rakes, LPN   Activities of Daily Living In your present state of health, do you have any difficulty performing the following activities: 10/09/2019  Hearing? N  Vision? N  Difficulty concentrating or making decisions? N  Walking or climbing stairs? N  Dressing or bathing? N  Doing errands, shopping? N  Preparing Food and eating ? N  Using the Toilet? N  In the past six months, have you accidently leaked urine? N  Do you have problems with loss of bowel control? N  Managing your Medications? N  Managing your Finances? N  Housekeeping or managing your Housekeeping? N  Some recent data might be hidden    Patient Care Team: Panosh, Standley Brooking, MD as PCP - General Regal, Tamala Fothergill, DPM (Podiatry) Bo Merino, MD (Rheumatology) Milus Banister, MD (Gastroenterology) Jari Pigg, MD as Attending Physician (Dermatology) Laurance Flatten, MD as Referring Physician (Orthopedic Surgery)  Indicate any recent Medical Services you may have received from other than [facility] providers in the past year (date may be approximate).     Assessment:   This is a routine wellness examination for [facility].  Hearing/Vision screen  Hearing Screening   125Hz  250Hz  500Hz  1000Hz  2000Hz  3000Hz  4000Hz  6000Hz  8000Hz   Right ear:           Left ear:           Comments: Pt denies any difficulty hearing at this time  Vision Screening Comments: Follows up with Dr Orene Desanctis this year for eye exams  Dietary issues and exercise activities discussed: Current Exercise Habits: Home exercise routine, Type of exercise: walking, Time (Minutes): 60, Frequency (Times/Week): 7, Weekly Exercise (Minutes/Week): 420  Goals    . Patient Stated     Try to get 10000 every day     . Patient Stated     Would like to get back to doing yoga again       Depression Screen PHQ 2/9 Scores 10/09/2019 12/05/2018 05/02/2017 03/30/2016 03/18/2015 01/06/2014 12/31/2012  PHQ - 2 Score 0 0 0 0 0 0 0    Fall Risk Fall Risk  10/09/2019 01/02/2019 05/02/2017 03/18/2015 03/18/2015  Falls in the past year? 0 0 No (No Data) Yes  Comment - - - fell  when hiking  down in [place]  -  Number falls in past yr: 0 0 - - -  Injury with Fall? 0 0 - No -  Risk for fall due to : Impaired mobility;Impaired vision - - - -  Risk for fall due to: Comment related to knee pain at times - - - -  Follow up Falls prevention discussed Falls evaluation completed - - -    Any stairs in or around the home? Yes  If so, are there any without handrails? No  Home free of loose throw rugs in walkways, pet beds, electrical cords,  etc? Yes  Adequate lighting in your home to reduce risk of falls? Yes   ASSISTIVE DEVICES UTILIZED TO PREVENT FALLS:  Life alert? No  Use of a cane, walker or w/c? Yes  Grab bars in the bathroom? No  Shower chair or bench in  shower? No  Elevated toilet seat or a handicapped toilet? No   TIMED UP AND GO:  Was the test performed? No .   Cognitive Function:     6CIT Screen 10/09/2019  What Year? 0 points  What month? 0 points  Count back from 20 0 points  Months in reverse 0 points  Repeat phrase 0 points    Immunizations Immunization History  Administered Date(s) Administered  . Influenza Split 12/27/2011, 10/15/2012  . Influenza Whole 12/03/2007, 12/24/2009, 10/25/2010  . Influenza, High Dose Seasonal PF 12/31/2013, 11/06/2014, 12/10/2016, 10/25/2017, 10/31/2018  . Influenza,inj,quad, With Preservative 11/10/2017  . Influenza-Unspecified 11/14/2016  . PFIZER SARS-COV-2 Vaccination 03/08/2019, 03/30/2019  . Pneumococcal Conjugate-13 01/06/2014  . Pneumococcal Polysaccharide-23 12/21/2010  . Td 05/27/2008  . Tdap 01/06/2014  . Zoster 12/03/2007  . Zoster Recombinat (Shingrix) 10/26/2017, 10/31/2018    TDAP status: Up to date Flu Vaccine status: Declined, Education has been provided regarding the importance of this vaccine but patient still declined. Advised may receive this vaccine at local pharmacy or Health Dept. Aware to provide a copy of the vaccination record if obtained from local pharmacy or Health Dept. Verbalized acceptance and understanding. Pneumococcal vaccine status: Up to date Covid-19 vaccine status: Completed vaccines  Qualifies for Shingles Vaccine? Yes   Zostavax completed Yes   Shingrix Completed?: Yes  Screening Tests Health Maintenance  Topic Date Due  . INFLUENZA VACCINE  09/15/2019  . COLONOSCOPY  12/29/2022  . TETANUS/TDAP  01/07/2024  . DEXA SCAN  Completed  . COVID-19 Vaccine  Completed  . Hepatitis C Screening  Completed  . PNA vac Low Risk Adult  Completed    Health Maintenance  Health Maintenance Due  Topic Date Due  . INFLUENZA VACCINE  09/15/2019    Colorectal cancer screening: Completed 12/28/12. Repeat every 10 years Mammogram status: Completed  02/11/19. Repeat every year Bone Density status: Completed 02/11/15. Results reflect: Bone density results: OSTEOPENIA. Repeat every 2 years.   Additional Screening:  Hepatitis C Screening:  Completed 03/18/15  Vision Screening: Recommended annual ophthalmology exams for early detection of glaucoma and other disorders of the eye. Is the patient up to date with their annual eye exam?  Yes but not since Covid will follow up this year Who is the provider or what is the name of the office in which the patient attends annual eye exams? Dr Therisa Doyne in [place]: Recommended annual dental exams for proper oral hygiene  Community Resource Referral / Chronic Care Management: CRR required this visit?  No   CCM required this visit?  No      Plan:     I have personally reviewed and noted the following in the patient's chart:   . Medical and social history . Use of alcohol, tobacco or illicit drugs  . Current medications and supplements . Functional ability and status . Nutritional status . Physical activity . Advanced directives . List of other physicians . Hospitalizations, surgeries, and ER visits in previous 12 months . Vitals . Screenings to include cognitive, depression, and falls . Referrals and appointments  In addition, I have reviewed and discussed with patient certain preventive protocols, quality metrics, and best practice  recommendations. A written personalized care plan for preventive services as well as general preventive health recommendations were provided to patient.     Willette Brace, LPN   [id number]   Nurse Notes: None

## 2019-10-09 NOTE — Patient Instructions (Signed)
Briana Giles , Thank you for taking time to come for your Medicare Wellness Visit. I appreciate your ongoing commitment to your health goals. Please review the following plan we discussed and let me know if I can assist you in the future.   Screening recommendations/referrals: Colonoscopy: Done 12/28/12 Mammogram: Done 02/11/19 Bone Density: Done 02/11/15 Recommended yearly ophthalmology/optometry visit for glaucoma screening and checkup Recommended yearly dental visit for hygiene and checkup  Vaccinations: Influenza vaccine: Due and discussed Pneumococcal vaccine: Up to date Tdap vaccine: Up to date Shingles vaccine: Completed 10/26/17 & 10/31/18   Covid-19:Completed 1/22 & 03/30/19  Advanced directives: Please bring a copy of your health care power of attorney and living will to the office at your convenience.  Conditions/risks identified: Start back taking yoga  Next appointment: Follow up in one year for your annual wellness visit    Preventive Care 65 Years and Older, Female Preventive care refers to lifestyle choices and visits with your health care provider that can promote health and wellness. What does preventive care include?  A yearly physical exam. This is also called an annual well check.  Dental exams once or twice a year.  Routine eye exams. Ask your health care provider how often you should have your eyes checked.  Personal lifestyle choices, including:  Daily care of your teeth and gums.  Regular physical activity.  Eating a healthy diet.  Avoiding tobacco and drug use.  Limiting alcohol use.  Practicing safe sex.  Taking low-dose aspirin every day.  Taking vitamin and mineral supplements as recommended by your health care provider. What happens during an annual well check? The services and screenings done by your health care provider during your annual well check will depend on your age, overall health, lifestyle risk factors, and family history of  disease. Counseling  Your health care provider may ask you questions about your:  Alcohol use.  Tobacco use.  Drug use.  Emotional well-being.  Home and relationship well-being.  Sexual activity.  Eating habits.  History of falls.  Memory and ability to understand (cognition).  Work and work Statistician.  Reproductive health. Screening  You may have the following tests or measurements:  Height, weight, and BMI.  Blood pressure.  Lipid and cholesterol levels. These may be checked every 5 years, or more frequently if you are over 57 years old.  Skin check.  Lung cancer screening. You may have this screening every year starting at age 71 if you have a 30-pack-year history of smoking and currently smoke or have quit within the past 15 years.  Fecal occult blood test (FOBT) of the stool. You may have this test every year starting at age 42.  Flexible sigmoidoscopy or colonoscopy. You may have a sigmoidoscopy every 5 years or a colonoscopy every 10 years starting at age 38.  Hepatitis C blood test.  Hepatitis B blood test.  Sexually transmitted disease (STD) testing.  Diabetes screening. This is done by checking your blood sugar (glucose) after you have not eaten for a while (fasting). You may have this done every 1-3 years.  Bone density scan. This is done to screen for osteoporosis. You may have this done starting at age 25.  Mammogram. This may be done every 1-2 years. Talk to your health care provider about how often you should have regular mammograms. Talk with your health care provider about your test results, treatment options, and if necessary, the need for more tests. Vaccines  Your health care provider may  recommend certain vaccines, such as:  Influenza vaccine. This is recommended every year.  Tetanus, diphtheria, and acellular pertussis (Tdap, Td) vaccine. You may need a Td booster every 10 years.  Zoster vaccine. You may need this after age  6.  Pneumococcal 13-valent conjugate (PCV13) vaccine. One dose is recommended after age 52.  Pneumococcal polysaccharide (PPSV23) vaccine. One dose is recommended after age 79. Talk to your health care provider about which screenings and vaccines you need and how often you need them. This information is not intended to replace advice given to you by your health care provider. Make sure you discuss any questions you have with your health care provider. Document Released: 02/27/2015 Document Revised: 10/21/2015 Document Reviewed: 12/02/2014 Elsevier Interactive Patient Education  2017 [place] Prevention in the Home Falls can cause injuries. They can happen to people of all ages. There are many things you can do to make your home safe and to help prevent falls. What can I do on the outside of my home?  Regularly fix the edges of walkways and driveways and fix any cracks.  Remove anything that might make you trip as you walk through a door, such as a raised step or threshold.  Trim any bushes or trees on the path to your home.  Use bright outdoor lighting.  Clear any walking paths of anything that might make someone trip, such as rocks or tools.  Regularly check to see if handrails are loose or broken. Make sure that both sides of any steps have handrails.  Any raised decks and porches should have guardrails on the edges.  Have any leaves, snow, or ice cleared regularly.  Use sand or salt on walking paths during winter.  Clean up any spills in your garage right away. This includes oil or grease spills. What can I do in the bathroom?  Use night lights.  Install grab bars by the toilet and in the tub and shower. Do not use towel bars as grab bars.  Use non-skid mats or decals in the tub or shower.  If you need to sit down in the shower, use a plastic, non-slip stool.  Keep the floor dry. Clean up any water that spills on the floor as soon as it happens.  Remove  soap buildup in the tub or shower regularly.  Attach bath mats securely with double-sided non-slip rug tape.  Do not have throw rugs and other things on the floor that can make you trip. What can I do in the bedroom?  Use night lights.  Make sure that you have a light by your bed that is easy to reach.  Do not use any sheets or blankets that are too big for your bed. They should not hang down onto the floor.  Have a firm chair that has side arms. You can use this for support while you get dressed.  Do not have throw rugs and other things on the floor that can make you trip. What can I do in the kitchen?  Clean up any spills right away.  Avoid walking on wet floors.  Keep items that you use a lot in easy-to-reach places.  If you need to reach something above you, use a strong step stool that has a grab bar.  Keep electrical cords out of the way.  Do not use floor polish or wax that makes floors slippery. If you must use wax, use non-skid floor wax.  Do not have throw rugs and  other things on the floor that can make you trip. What can I do with my stairs?  Do not leave any items on the stairs.  Make sure that there are handrails on both sides of the stairs and use them. Fix handrails that are broken or loose. Make sure that handrails are as long as the stairways.  Check any carpeting to make sure that it is firmly attached to the stairs. Fix any carpet that is loose or worn.  Avoid having throw rugs at the top or bottom of the stairs. If you do have throw rugs, attach them to the floor with carpet tape.  Make sure that you have a light switch at the top of the stairs and the bottom of the stairs. If you do not have them, ask someone to add them for you. What else can I do to help prevent falls?  Wear shoes that:  Do not have high heels.  Have rubber bottoms.  Are comfortable and fit you well.  Are closed at the toe. Do not wear sandals.  If you use a  stepladder:  Make sure that it is fully opened. Do not climb a closed stepladder.  Make sure that both sides of the stepladder are locked into place.  Ask someone to hold it for you, if possible.  Clearly mark and make sure that you can see:  Any grab bars or handrails.  First and last steps.  Where the edge of each step is.  Use tools that help you move around (mobility aids) if they are needed. These include:  Canes.  Walkers.  Scooters.  Crutches.  Turn on the lights when you go into a dark area. Replace any light bulbs as soon as they burn out.  Set up your furniture so you have a clear path. Avoid moving your furniture around.  If any of your floors are uneven, fix them.  If there are any pets around you, be aware of where they are.  Review your medicines with your doctor. Some medicines can make you feel dizzy. This can increase your chance of falling. Ask your doctor what other things that you can do to help prevent falls. This information is not intended to replace advice given to you by your health care provider. Make sure you discuss any questions you have with your health care provider. Document Released: 11/27/2008 Document Revised: 07/09/2015 Document Reviewed: 03/07/2014 Elsevier Interactive Patient Education  2017 [employer].

## 2019-10-15 ENCOUNTER — Other Ambulatory Visit: Payer: Self-pay | Admitting: Internal Medicine

## 2019-11-15 ENCOUNTER — Other Ambulatory Visit: Payer: Self-pay | Admitting: Internal Medicine

## 2019-11-15 NOTE — Telephone Encounter (Signed)
Forwarding to PCP for Trazadone approval

## 2019-11-15 NOTE — Telephone Encounter (Signed)
Will you advise in PCP absence.   Last visit with PCP was 05/30/2019. No upcoming appointments.   Please and thank you!

## 2019-11-15 NOTE — Telephone Encounter (Signed)
Can wait until PCP returns, she does not need refills right now

## 2020-01-01 ENCOUNTER — Other Ambulatory Visit: Payer: Self-pay | Admitting: Podiatry

## 2020-01-01 DIAGNOSIS — M67969 Unspecified disorder of synovium and tendon, unspecified lower leg: Secondary | ICD-10-CM

## 2020-01-01 DIAGNOSIS — M79671 Pain in right foot: Secondary | ICD-10-CM

## 2020-01-03 ENCOUNTER — Other Ambulatory Visit: Payer: Self-pay | Admitting: Physician Assistant

## 2020-01-03 NOTE — Telephone Encounter (Signed)
Patient advised she is due to update labs. Patient states she is out of state and unable to update at this time. Patient states she does not need a refill at this time.

## 2020-01-15 NOTE — Progress Notes (Signed)
Office Visit Note  Patient: Briana Giles             Date of Birth: [birth year]           MRN: [id number]             PCP: Madelin Headings, MD Referring: Madelin Headings, MD Visit Date: 01/28/2020 Occupation: @GUAROCC @  Subjective:  Joint stiffness   History of Present Illness: Briana Giles is a 74 y.o. female with history of osteoarthritis and DDD. She states she had her left knee replaced by Dr. Jerl Santos in May 2021. She continues to have some residual pain and stiffness in the left knee.  She plans on restarting physical therapy.  She has been walking 1.5 to 2 miles on a daily basis as well as going to [employer] and yoga for exercise.  She takes tramadol 50 mg 1 tablet by mouth daily as needed and tylenol as needed for pain relief.  Overall she has noticed increased discomfort and stiffness since discontinuing Celebrex.  She experiences intermittent discomfort in her lower back, which is typically exacerbated by physical activity.  She takes  zanaflex 4 mg po at bedtime prn for muscle spasms.  She has been experiencing increased pain in both feet especially the right foot.  According to the patient she was diagnosed with tendinitis of the right great toe.  She has been seeing a podiatrist and is scheduled for an MRI of the right foot soon.  She has been trying to wear her tennis shoes on a daily basis which provide good support.  She continues to have difficulty sleeping at night. She takes trazodone 50 mg 1 tablet by mouth at bedtime as needed for insomnia.       Activities of Daily Living:  Patient reports morning stiffness for 30  minutes.   Patient Reports nocturnal pain.  Difficulty dressing/grooming: Denies Difficulty climbing stairs: Reports Difficulty getting out of chair: Reports Difficulty using hands for taps, buttons, cutlery, and/or writing: Denies  Review of Systems  Constitutional: Negative for fatigue.  HENT: Negative for mouth sores, mouth dryness and nose  dryness.   Eyes: Positive for dryness. Negative for pain and visual disturbance.  Respiratory: Negative for cough, hemoptysis, shortness of breath and difficulty breathing.   Cardiovascular: Negative for chest pain, palpitations, hypertension and swelling in legs/feet.  Gastrointestinal: Positive for constipation. Negative for blood in stool and diarrhea.  Endocrine: Negative for increased urination.  Genitourinary: Negative for painful urination.  Musculoskeletal: Positive for arthralgias, joint pain and morning stiffness. Negative for joint swelling, myalgias, muscle weakness, muscle tenderness and myalgias.  Skin: Negative for color change, pallor, rash, hair loss, nodules/bumps, skin tightness, ulcers and sensitivity to sunlight.  Allergic/Immunologic: Negative for susceptible to infections.  Neurological: Negative for dizziness, numbness, headaches and weakness.  Hematological: Negative for swollen glands.  Psychiatric/Behavioral: Positive for sleep disturbance. Negative for depressed mood. The patient is not nervous/anxious.     PMFS History:  Patient Active Problem List   Diagnosis Date Noted  . DJD (degenerative joint disease), cervical 06/08/2016  . DDD (degenerative disc disease), lumbar 06/08/2016  . Abnormal serum angiotensin-converting enzyme level 06/08/2016  . History of partial knee replacement 05/31/2016  . Allergic rhinitis   . Chronic prescription benzodiazepine use 01/06/2014  . Acute upper respiratory infections of unspecified site 10/05/2013  . Persistent cough for 3 weeks or longer 10/05/2013  . 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 Abdominal  bruit 12/31/2012  . Encounter for Medicare annual wellness exam 12/31/2012  . Degenerative joint disease of spine multilevel 03/07/2012  . Spinal stenosis, lumbar 03/07/2012  . Hip pain 09/02/2011  . Sciatica of left side 08/14/2011  . Spinal stenosis of lumbar region 08/14/2011  .  Encounter for long-term (current) use of other medications 06/25/2011  . High risk medication use 12/21/2010  . Need for lipid screening 12/21/2010  . Foot pain, left 07/22/2010  . CHEST PAIN 02/18/2010  . Osteopenia 07/05/2009  . TREMOR, RIGHT HAND 01/26/2009  . BACK PAIN 09/30/2008  . NEOPLASM, SKIN, UNCERTAIN BEHAVIOR 05/27/2008  . HYPERPOTASSEMIA 05/27/2008  . Essential hypertension 10/12/2007  . TRANSIENT GLOBAL AMNESIA 10/12/2007  . VARICOSE VEINS, LOWER EXTREMITIES 06/06/2007  . SYSTOLIC MURMUR 06/06/2007  . ABDOMINAL BRUIT 06/06/2007  . ALLERGIC RHINITIS 08/17/2006  . GERD 08/17/2006  . Osteoarthritis 08/17/2006  . Disturbance in sleep behavior 08/17/2006    Past Medical History:  Diagnosis Date  . Abdominal pain, other specified site   . Abnormal LFTs   . Allergic rhinitis   . Cervical back pain with evidence of disc disease   . GERD (gastroesophageal reflux disease)   . Heart murmur    says all her life had echo in [place] year ago not to worry  . Hypertension   . Nerve damage    left arm  . Osteoarthritis   . Osteopenia   . Phlebitis   . Spinal stenosis   . Transient global amnesia     Family History  Problem Relation Age of Onset  . Hypertension Father   . Alcohol abuse Father   . Lung cancer Father   . Cancer Maternal Grandmother        sinus  . COPD Mother   . Osteoporosis Mother   . Heart disease Mother   . Hypertension Brother   . Heart Problems Brother   . Arthritis Brother   . Prostate cancer Maternal Grandfather   . Heart attack Brother   . Hypertension Brother   . Healthy Daughter   . Healthy Son   . Colon cancer Neg Hx   . Pancreatic cancer Neg Hx   . Stomach cancer Neg Hx    Past Surgical History:  Procedure Laterality Date  . BUNIONECTOMY     fall 2022 [facility]  . CHOLECYSTECTOMY    . CYST EXCISION Right 01/2018   right thumb   . FOOT SURGERY      x 2    . HAND SURGERY     right hand  . KNEE ARTHROSCOPY     right  .  REPLACEMENT UNICONDYLAR JOINT KNEE     rt knee  . TONSILLECTOMY    . 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  [place] grand child    Does water exercise aerobics walking elliptical    Immunization History  Administered Date(s) Administered  . Influenza Split 12/27/2011, 10/15/2012  . Influenza Whole 12/03/2007, 12/24/2009, 10/25/2010  . Influenza, High Dose Seasonal PF 12/31/2013, 11/06/2014, 12/10/2016, 10/25/2017, 10/31/2018  . Influenza,inj,quad, With Preservative 11/10/2017  . Influenza-Unspecified 11/14/2016  . PFIZER SARS-COV-2 Vaccination 03/08/2019, 03/30/2019, 10/30/2019  . Pneumococcal Conjugate-13 01/06/2014  . Pneumococcal Polysaccharide-23 12/21/2010  . Td 05/27/2008  . Tdap 01/06/2014  . Zoster 12/03/2007  . Zoster Recombinat (Shingrix)  10/26/2017, 10/31/2018     Objective: Vital Signs: BP 110/68 (BP Location: Left Arm, Patient Position: Sitting, Cuff Size: Normal)   Pulse 80   Resp 15   Ht 5\' 1"  (1.549 m)   Wt 131 lb 3.2 oz (59.5 kg)   BMI 24.79 kg/m    Physical Exam Vitals and nursing note reviewed.  Constitutional:      Appearance: She is well-developed and well-nourished.  HENT:     Head: Normocephalic and atraumatic.  Eyes:     Extraocular Movements: EOM normal.     Conjunctiva/sclera: Conjunctivae normal.  Cardiovascular:     Pulses: Intact distal pulses.  Pulmonary:     Effort: Pulmonary effort is normal.  Abdominal:     Palpations: Abdomen is soft.  Musculoskeletal:     Cervical back: Normal range of motion.  Skin:    General: Skin is warm and dry.     Capillary Refill: Capillary refill takes less than 2 seconds.  Neurological:     Mental Status: She is alert and oriented to person, place, and time.  Psychiatric:        Mood and Affect: Mood and affect normal.        Behavior: Behavior  normal.      Musculoskeletal Exam: C-spine, thoracic spine, and lumbar spine good ROM.  Shoulder joints ,elbow joints, wrist joints, MCPs, PIPs, and DIPs good ROM with no synovitis.  Complete fist formation bilaterally.  Hip joints good ROM bilaterally.  Right partial knee replacement has good ROM with no discomfort.  Left total knee arthoplasty has slightly limited extension and warmth.  Ankle joints good ROM with no tenderness or inflammation.    CDAI Exam: CDAI Score: -- Patient Global: --; Provider Global: -- Swollen: --; Tender: -- Joint Exam 01/28/2020   No joint exam has been documented for this visit   There is currently no information documented on the homunculus. Go to the Rheumatology activity and complete the homunculus joint exam.  Investigation: No additional findings.  Imaging: No results found.  Recent Labs: Lab Results  Component Value Date   WBC 7.8 05/29/2019   HGB 14.1 05/29/2019   PLT 286.0 05/29/2019   NA 135 06/04/2019   K 4.9 06/04/2019   CL 98 06/04/2019   CO2 30 06/04/2019   GLUCOSE 85 06/04/2019   BUN 21 06/04/2019   CREATININE 0.91 06/04/2019   BILITOT 0.4 12/26/2018   ALKPHOS 105 12/26/2018   AST 20 12/26/2018   ALT 20 12/26/2018   PROT 7.0 12/26/2018   ALBUMIN 4.5 12/26/2018   CALCIUM 10.1 06/04/2019   GFRAA 68 12/15/2016    Speciality Comments: No specialty comments available.  Procedures:  No procedures performed Allergies: Codeine phosphate   Assessment / Plan:     Visit Diagnoses: Primary osteoarthritis of both hands: She has no joint tenderness or synovitis on exam.  She has complete fist formation bilaterally.  No features of rheumatoid arthritis noted on examination.  She had an ultrasound of both hands performed on 09/10/2018 which was negative for synovitis.  She experiences occasional discomfort and stiffness in both hands due to underlying osteoarthritic changes.  We discussed the importance of joint protection and muscle  strengthening.  We also discussed starting natural anti-inflammatories and a list was provided to the patient today.    Primary osteoarthritis of both feet: Chronic pain.  She is been experiencing increased discomfort in both feet especially the right foot.  She has been evaluated by a podiatrist and  is awaiting a MRI of the right foot for further evaluation.  S/P total knee arthroplasty, left - Performed by Dr. Jerl Santos in May 2021.  She continues to have residual discomfort and stiffness.  She has been taking tramadol 50 mg daily as needed and tylenol as needed for pain relief.  She has been walking 1.5-2 miles daily, attending [employer] sneakers, and yoga on a weekly basis.  She has difficulty kneeling during yoga and tries to double up her mat.  She has slightly limited extension on examination today.  She will be restarting physical therapy to work ROM and strengthening exercises.    History of partial knee replacement - Right: She has good ROM with no discomfort at this time.   Family history of rheumatoid arthritis   DDD (degenerative disc disease), cervical: She is not experiencing any discomfort in her neck at this time.  She has good ROM of the c-spine with no discomfort.  No symptoms of radiculopathy.   DDD (degenerative disc disease), lumbar: She experiences occasional lower back pain and stiffness, which is typically activity related.  She takes zanaflex 4 mg po at bedtime as needed for muscle spasms. She does not need a refill at this time.   Osteopenia of multiple sites: DEXA ordered by Dr. Fabian Sharp on 02/11/15.  T-score -1.2 RFN.  She was due to update DEXA in December 2018. I discussed the importance of updating the DEXA ASAP.   She has not had any recent falls or fractures.   Other chronic pain: UDS was consistent with tramadol use on 01/24/20.  Narcotic agreement was updated today on 01/28/20.  She has been getting refills of tramadol from Dr. Jerl Santos status post left knee total  arthroplasty.  She would like just to take back over the prescription for tramadol once she has been cleared by Dr. Jerl Santos.  We discussed the importance of updating UDS and narcotic agreement every 6 months.  She voiced understanding.  Other medical conditions are listed as follows:   Abnormal serum angiotensin-converting enzyme level  History of hypertension  History of bunionectomy  History of gastroesophageal reflux (GERD)  History of varicose veins  Orders: No orders of the defined types were placed in this encounter.  No orders of the defined types were placed in this encounter.    Follow-Up Instructions: Return in about 6 months (around 07/28/2020) for Osteoarthritis, DDD.   Gearldine Bienenstock, PA-C  Note - This record has been created using Dragon software.  Chart creation errors have been sought, but may not always  have been located. Such creation errors do not reflect on  the standard of medical care.

## 2020-01-21 ENCOUNTER — Other Ambulatory Visit: Payer: Self-pay | Admitting: Internal Medicine

## 2020-01-24 ENCOUNTER — Other Ambulatory Visit: Payer: Self-pay | Admitting: *Deleted

## 2020-01-24 DIAGNOSIS — G8929 Other chronic pain: Secondary | ICD-10-CM

## 2020-01-24 DIAGNOSIS — Z5181 Encounter for therapeutic drug level monitoring: Secondary | ICD-10-CM

## 2020-01-26 LAB — DRUG MONITOR, PANEL 5, W/CONF, URINE
Amphetamines: NEGATIVE ng/mL (ref <500)
Barbiturates: NEGATIVE ng/mL (ref <300)
Benzodiazepines: NEGATIVE ng/mL (ref <100)
Cocaine Metabolite: NEGATIVE ng/mL (ref <150)
Creatinine: 98.7 mg/dL
Marijuana Metabolite: NEGATIVE ng/mL (ref <20)
Methadone Metabolite: NEGATIVE ng/mL (ref <100)
Opiates: NEGATIVE ng/mL (ref <100)
Oxidant: NEGATIVE ug/mL
Oxycodone: NEGATIVE ng/mL (ref <100)
pH: 7.6 (ref 4.5–9.0)

## 2020-01-26 LAB — DRUG MONITOR, TRAMADOL,QN, URINE
Desmethyltramadol: 2487 ng/mL — ABNORMAL HIGH (ref <100)
Tramadol: 8241 ng/mL — ABNORMAL HIGH (ref <100)

## 2020-01-26 LAB — DM TEMPLATE

## 2020-01-27 NOTE — Progress Notes (Signed)
Consistent with tramadol use

## 2020-01-28 ENCOUNTER — Encounter: Payer: Self-pay | Admitting: Physician Assistant

## 2020-01-28 ENCOUNTER — Other Ambulatory Visit: Payer: Self-pay

## 2020-01-28 ENCOUNTER — Ambulatory Visit (INDEPENDENT_AMBULATORY_CARE_PROVIDER_SITE_OTHER): Payer: Medicare Other | Admitting: Physician Assistant

## 2020-01-28 VITALS — BP 110/68 | HR 80 | Resp 15 | Ht 61.0 in | Wt 131.2 lb

## 2020-01-28 DIAGNOSIS — Z8261 Family history of arthritis: Secondary | ICD-10-CM

## 2020-01-28 DIAGNOSIS — M8589 Other specified disorders of bone density and structure, multiple sites: Secondary | ICD-10-CM

## 2020-01-28 DIAGNOSIS — Z96659 Presence of unspecified artificial knee joint: Secondary | ICD-10-CM

## 2020-01-28 DIAGNOSIS — Z96652 Presence of left artificial knee joint: Secondary | ICD-10-CM | POA: Diagnosis not present

## 2020-01-28 DIAGNOSIS — M19041 Primary osteoarthritis, right hand: Secondary | ICD-10-CM | POA: Diagnosis not present

## 2020-01-28 DIAGNOSIS — M5136 Other intervertebral disc degeneration, lumbar region: Secondary | ICD-10-CM

## 2020-01-28 DIAGNOSIS — Z8679 Personal history of other diseases of the circulatory system: Secondary | ICD-10-CM

## 2020-01-28 DIAGNOSIS — M5030 Other cervical disc degeneration, unspecified cervical region: Secondary | ICD-10-CM

## 2020-01-28 DIAGNOSIS — M19042 Primary osteoarthritis, left hand: Secondary | ICD-10-CM

## 2020-01-28 DIAGNOSIS — Z8719 Personal history of other diseases of the digestive system: Secondary | ICD-10-CM

## 2020-01-28 DIAGNOSIS — G8929 Other chronic pain: Secondary | ICD-10-CM

## 2020-01-28 DIAGNOSIS — M19072 Primary osteoarthritis, left ankle and foot: Secondary | ICD-10-CM

## 2020-01-28 DIAGNOSIS — Z9889 Other specified postprocedural states: Secondary | ICD-10-CM

## 2020-01-28 DIAGNOSIS — M19071 Primary osteoarthritis, right ankle and foot: Secondary | ICD-10-CM | POA: Diagnosis not present

## 2020-01-28 DIAGNOSIS — R748 Abnormal levels of other serum enzymes: Secondary | ICD-10-CM

## 2020-02-05 ENCOUNTER — Ambulatory Visit
Admission: RE | Admit: 2020-02-05 | Discharge: 2020-02-05 | Disposition: A | Discharge status: Home / Self Care | Payer: Medicare Other | Source: Ambulatory Visit | Attending: Podiatry | Admitting: Podiatry

## 2020-02-05 ENCOUNTER — Other Ambulatory Visit: Payer: Self-pay

## 2020-02-05 DIAGNOSIS — M79671 Pain in right foot: Secondary | ICD-10-CM

## 2020-02-05 DIAGNOSIS — M67969 Unspecified disorder of synovium and tendon, unspecified lower leg: Secondary | ICD-10-CM

## 2020-03-01 ENCOUNTER — Other Ambulatory Visit: Payer: Self-pay | Admitting: Internal Medicine

## 2020-03-02 NOTE — Telephone Encounter (Signed)
Last office visit--08/21/2019 Last refill-01/21/2020---30 tabs with 1 refill

## 2020-03-12 ENCOUNTER — Encounter: Payer: Self-pay | Admitting: Family Medicine

## 2020-03-12 ENCOUNTER — Ambulatory Visit (INDEPENDENT_AMBULATORY_CARE_PROVIDER_SITE_OTHER): Payer: Medicare Other | Admitting: Family Medicine

## 2020-03-12 ENCOUNTER — Other Ambulatory Visit: Payer: Self-pay

## 2020-03-12 VITALS — BP 116/68 | HR 70 | Ht 61.0 in | Wt 132.0 lb

## 2020-03-12 DIAGNOSIS — M25542 Pain in joints of left hand: Secondary | ICD-10-CM

## 2020-03-12 NOTE — Progress Notes (Signed)
Subjective:    Patient ID: Briana Giles, female    DOB: [birth year], 75 y.o.   MRN: [id number]  HPI Here for one week of mild swelling and discomfort in the left 5th finger. No recent trauma. She has OA and sees a rheumatologist, but she has never had a joint act like this. She has not done anything to treat it so far.    Review of Systems  Constitutional: Negative.   Respiratory: Negative.   Cardiovascular: Negative.   Musculoskeletal: Positive for arthralgias.       Objective:   Physical Exam Constitutional:      Appearance: Normal appearance.  Cardiovascular:     Rate and Rhythm: Normal rate and regular rhythm.     Pulses: Normal pulses.     Heart sounds: Normal heart sounds.  Pulmonary:     Effort: Pulmonary effort is normal.     Breath sounds: Normal breath sounds.  Musculoskeletal:     Comments: The DIP joint of the left 5th finger is pink, slightly swollen, and slightly tender. No warmth.   Neurological:     Mental Status: She is alert.           Assessment & Plan:  She has some acute inflammation in the finger of unclear etiology. She will apply ice packs and apply Voltaren gel (OTC) several times daily. Recheck as needed.  Gershon Crane, MD

## 2020-03-19 ENCOUNTER — Ambulatory Visit (INDEPENDENT_AMBULATORY_CARE_PROVIDER_SITE_OTHER): Payer: Medicare Other | Admitting: Orthopedic Surgery

## 2020-03-19 DIAGNOSIS — M2021 Hallux rigidus, right foot: Secondary | ICD-10-CM

## 2020-03-26 ENCOUNTER — Encounter: Payer: Self-pay | Admitting: Orthopedic Surgery

## 2020-03-26 NOTE — Progress Notes (Signed)
Office Visit Note   Patient: Briana Giles           Date of Birth: [birth year]           MRN: [id number] Visit Date: 03/19/2020              Requested by: Madelin Headings, MD [address] [place],  [place] [address] PCP: Madelin Headings, MD  Chief Complaint  Patient presents with  . Right Foot - Pain      HPI: Patient is a 75 year old woman who is seen for initial evaluation for right great toe pain at the MTP joint.  Patient complains of pain with ambulation patient has seen podiatry and recommended joint replacement surgery for the MTP joint.  Assessment & Plan: Visit Diagnoses:  1. Hallux rigidus, right foot     Plan: Recommended conservative treatment to start with Achilles stretching stiff soled shoe or a carbon plate with a over-the-counter sole orthotic.  Follow-up if she is still symptomatic after conservative therapy.  Follow-Up Instructions: Return if symptoms worsen or fail to improve.   Ortho Exam  Patient is alert, oriented, no adenopathy, well-dressed, normal affect, normal respiratory effort. Examination patient has tenderness to palpation of the MTP joint with dorsal osteophytic bone spurs.  She has significant hallux rigidus with dorsiflexion only of about 10 degrees. 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  She does have Achilles tightness with dorsiflexion to neutral.  She has good pulses.  Imaging: No results found. No images are attached to the encounter.  Labs: Lab Results  Component Value Date   ESRSEDRATE 14 08/07/2018   LABURIC 4.8 08/07/2018     Lab Results  Component Value Date   ALBUMIN 4.5 12/26/2018   ALBUMIN 4.8 05/02/2017   ALBUMIN 4.2 03/31/2016   LABURIC 4.8 08/07/2018    No results found for: MG Lab Results  Component Value Date   VD25OH 42 12/15/2016   VD25OH 35.19 03/31/2014   VD25OH 36 05/19/2008    No results found for:  PREALBUMIN CBC EXTENDED Latest Ref Rng & Units 05/29/2019 12/26/2018 10/25/2016  WBC 4.0 - 10.5 K/uL 7.8 8.1 6.4  RBC 3.87 - 5.11 Mil/uL 4.42 4.25 4.32  HGB 12.0 - 15.0 g/dL 32.4 40.1 02.7  HCT 25.3 - 46.0 % 42.6 40.3 40.2  PLT 150.0 - 400.0 K/uL 286.0 273.0 232  NEUTROABS 1.4 - 7.7 K/uL 5.2 5.9 3,904  LYMPHSABS 0.7 - 4.0 K/uL 1.6 1.2 1,510     There is no height or weight on file to calculate BMI.  Orders:  No orders of the defined types were placed in this encounter.  No orders of the defined types were placed in this encounter.    Procedures: No procedures performed  Clinical Data: No additional findings.  ROS:  All other systems negative, except as noted in the HPI. Review of Systems  Objective: Vital Signs: There were no vitals taken for this visit.  Specialty Comments:  No specialty comments available.  PMFS History: Patient Active Problem List   Diagnosis Date Noted  . DJD (degenerative joint disease), cervical 06/08/2016  . DDD (degenerative disc disease), lumbar 06/08/2016  . Abnormal serum angiotensin-converting enzyme level 06/08/2016  . History of partial knee replacement 05/31/2016  . Allergic rhinitis   . Chronic prescription benzodiazepine use 01/06/2014  . Acute upper respiratory infections of unspecified site 10/05/2013  .  Persistent cough for 3 weeks or longer 10/05/2013  . 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 Abdominal bruit 12/31/2012  . Encounter for Medicare annual wellness exam 12/31/2012  . Degenerative joint disease of spine multilevel 03/07/2012  . Spinal stenosis, lumbar 03/07/2012  . Hip pain 09/02/2011  . Sciatica of left side 08/14/2011  . Spinal stenosis of lumbar region 08/14/2011  . Encounter for long-term (current) use of other medications 06/25/2011  . High risk medication use 12/21/2010  . Need for lipid screening 12/21/2010  . Foot pain, left 07/22/2010  . CHEST PAIN 02/18/2010  .  Osteopenia 07/05/2009  . TREMOR, RIGHT HAND 01/26/2009  . BACK PAIN 09/30/2008  . NEOPLASM, SKIN, UNCERTAIN BEHAVIOR 05/27/2008  . HYPERPOTASSEMIA 05/27/2008  . Essential hypertension 10/12/2007  . TRANSIENT GLOBAL AMNESIA 10/12/2007  . VARICOSE VEINS, LOWER EXTREMITIES 06/06/2007  . SYSTOLIC MURMUR 06/06/2007  . ABDOMINAL BRUIT 06/06/2007  . ALLERGIC RHINITIS 08/17/2006  . GERD 08/17/2006  . Osteoarthritis 08/17/2006  . Disturbance in sleep behavior 08/17/2006   Past Medical History:  Diagnosis Date  . Abdominal pain, other specified site   . Abnormal LFTs   . Allergic rhinitis   . Cervical back pain with evidence of disc disease   . GERD (gastroesophageal reflux disease)   . Heart murmur    says all her life had echo in [place] year ago not to worry  . Hypertension   . Nerve damage    left arm  . Osteoarthritis   . Osteopenia   . Phlebitis   . Spinal stenosis   . Transient global amnesia     Family History  Problem Relation Age of Onset  . Hypertension Father   . Alcohol abuse Father   . Lung cancer Father   . Cancer Maternal Grandmother        sinus  . COPD Mother   . Osteoporosis Mother   . Heart disease Mother   . Hypertension Brother   . Heart Problems Brother   . Arthritis Brother   . Prostate cancer Maternal Grandfather   . Heart attack Brother   . Hypertension Brother   . Healthy Daughter   . Healthy Son   . Colon cancer Neg Hx   . Pancreatic cancer Neg Hx   . Stomach cancer Neg Hx     Past Surgical History:  Procedure Laterality Date  . BUNIONECTOMY     fall 2022 [facility]  . CHOLECYSTECTOMY    . CYST EXCISION Right 01/2018   right thumb   . FOOT SURGERY      x 2    . HAND SURGERY     right hand  . KNEE ARTHROSCOPY     right  . REPLACEMENT UNICONDYLAR JOINT KNEE     rt knee  . TONSILLECTOMY    . TOTAL KNEE ARTHROPLASTY Left 06/2019   Social History   Occupational History  . Occupation: retired Magazine features editor: RETIRED  Tobacco  Use  . Smoking status: Never Smoker  . Smokeless tobacco: Never Used  Vaping Use  . Vaping Use: Never used  Substance and Sexual Activity  . Alcohol use: Yes    Comment: occasional   . Drug use: Never  . Sexual activity: Not on file

## 2020-06-29 ENCOUNTER — Other Ambulatory Visit: Payer: Self-pay

## 2020-06-29 NOTE — Telephone Encounter (Signed)
I called patient, Dr. Estanislado Pandy will not be able to refill Tramadol due to patient receiving Tramadol from another provider.

## 2020-06-29 NOTE — Telephone Encounter (Signed)
LMOM for patient to call office to discuss medication refill.  Per Dr. Estanislado Pandy, patient has been getting Tramadol filled with another provider, we will not be able to fill RX for Tramadol.

## 2020-06-29 NOTE — Telephone Encounter (Signed)
Patient left a voicemail requesting a prescription of Tramadol.  Patient states Dr. Estanislado Pandy has written a prescription for her in the past.   Patient requested a return call.

## 2020-06-29 NOTE — Telephone Encounter (Signed)
Last Visit: 01/28/2020 Next Visit: 07/28/2020 UDS: 01/24/2020, Consistent with tramadol use, tramadol 50 mg daily as needed  Narc Agreement: 01/28/2020  Last Fill: 09/20/2019  Okay to refill Tramadol?

## 2020-06-29 NOTE — Telephone Encounter (Signed)
Patient has been getting tramadol from another provider since August 2021.  We have not filled tramadol since March 2021.

## 2020-07-14 NOTE — Progress Notes (Deleted)
Office Visit Note  Patient: Briana Giles             Date of Birth: [birth year]           MRN: [id number]             PCP: Madelin Headings, MD Referring: Madelin Headings, MD Visit Date: 07/28/2020 Occupation: @GUAROCC @  Subjective:  No chief complaint on file.   History of Present Illness: Briana Giles is a 75 y.o. female ***   Activities of Daily Living:  Patient reports morning stiffness for *** {minute/hour:19697}.   Patient {ACTIONS;DENIES/REPORTS:21021675::"Denies"} nocturnal pain.  Difficulty dressing/grooming: {ACTIONS;DENIES/REPORTS:21021675::"Denies"} Difficulty climbing stairs: {ACTIONS;DENIES/REPORTS:21021675::"Denies"} Difficulty getting out of chair: {ACTIONS;DENIES/REPORTS:21021675::"Denies"} Difficulty using hands for taps, buttons, cutlery, and/or writing: {ACTIONS;DENIES/REPORTS:21021675::"Denies"}  No Rheumatology ROS completed.   PMFS History:  Patient Active Problem List   Diagnosis Date Noted  . DJD (degenerative joint disease), cervical 06/08/2016  . DDD (degenerative disc disease), lumbar 06/08/2016  . Abnormal serum angiotensin-converting enzyme level 06/08/2016  . History of partial knee replacement 05/31/2016  . Allergic rhinitis   . Chronic prescription benzodiazepine use 01/06/2014  . Acute upper respiratory infections of unspecified site 10/05/2013  . Persistent cough for 3 weeks or longer 10/05/2013  . 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 Abdominal bruit 12/31/2012  . Encounter for Medicare annual wellness exam 12/31/2012  . Degenerative joint disease of spine multilevel 03/07/2012  . Spinal stenosis, lumbar 03/07/2012  . Hip pain 09/02/2011  . Sciatica of left side 08/14/2011  . Spinal stenosis of lumbar region 08/14/2011  . Encounter for long-term (current) use of other medications 06/25/2011  . High risk medication use 12/21/2010  . Need for lipid screening 12/21/2010  . Foot pain, left 07/22/2010   . CHEST PAIN 02/18/2010  . Osteopenia 07/05/2009  . TREMOR, RIGHT HAND 01/26/2009  . BACK PAIN 09/30/2008  . NEOPLASM, SKIN, UNCERTAIN BEHAVIOR 05/27/2008  . HYPERPOTASSEMIA 05/27/2008  . Essential hypertension 10/12/2007  . TRANSIENT GLOBAL AMNESIA 10/12/2007  . VARICOSE VEINS, LOWER EXTREMITIES 06/06/2007  . SYSTOLIC MURMUR 06/06/2007  . ABDOMINAL BRUIT 06/06/2007  . ALLERGIC RHINITIS 08/17/2006  . GERD 08/17/2006  . Osteoarthritis 08/17/2006  . Disturbance in sleep behavior 08/17/2006    Past Medical History:  Diagnosis Date  . Abdominal pain, other specified site   . Abnormal LFTs   . Allergic rhinitis   . Cervical back pain with evidence of disc disease   . GERD (gastroesophageal reflux disease)   . Heart murmur    says all her life had echo in [place] year ago not to worry  . Hypertension   . Nerve damage    left arm  . Osteoarthritis   . Osteopenia   . Phlebitis   . Spinal stenosis   . Transient global amnesia     Family History  Problem Relation Age of Onset  . Hypertension Father   . Alcohol abuse Father   . Lung cancer Father   . Cancer Maternal Grandmother        sinus  . COPD Mother   . Osteoporosis Mother   . Heart disease Mother   . Hypertension Brother   . Heart Problems Brother   . Arthritis Brother   . Prostate cancer Maternal Grandfather   . Heart attack Brother   . Hypertension Brother   . Healthy Daughter   . Healthy Son   . Colon cancer Neg Hx   . Pancreatic cancer Neg  Hx   . Stomach cancer Neg Hx    Past Surgical History:  Procedure Laterality Date  . BUNIONECTOMY     fall 2022 [place]  . CHOLECYSTECTOMY    . CYST EXCISION Right 01/2018   right thumb   . FOOT SURGERY      x 2    . HAND SURGERY     right hand  . KNEE ARTHROSCOPY     right  . REPLACEMENT UNICONDYLAR JOINT KNEE     rt knee  . TONSILLECTOMY    . 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  [place] grand child    Does water exercise aerobics walking elliptical    Immunization History  Administered Date(s) Administered  . Influenza Split 12/27/2011, 10/15/2012  . Influenza Whole 12/03/2007, 12/24/2009, 10/25/2010  . Influenza, High Dose Seasonal PF 12/31/2013, 11/06/2014, 12/10/2016, 10/25/2017, 10/31/2018  . Influenza,inj,quad, With Preservative 11/10/2017  . Influenza-Unspecified 11/14/2016  . PFIZER(Purple Top)SARS-COV-2 Vaccination 03/08/2019, 03/30/2019, 10/30/2019  . Pneumococcal Conjugate-13 01/06/2014  . Pneumococcal Polysaccharide-23 12/21/2010  . Td 05/27/2008  . Tdap 01/06/2014  . Zoster Recombinat (Shingrix) 10/26/2017, 10/31/2018  . Zoster, Live 12/03/2007     Objective: Vital Signs: There were no vitals taken for this visit.   Physical Exam   Musculoskeletal Exam: ***  CDAI Exam: CDAI Score: -- Patient Global: --; Provider Global: -- Swollen: --; Tender: -- Joint Exam 07/28/2020   No joint exam has been documented for this visit   There is currently no information documented on the homunculus. Go to the Rheumatology activity and complete the homunculus joint exam.  Investigation: No additional findings.  Imaging: No results found.  Recent Labs: Lab Results  Component Value Date   WBC 7.8 05/29/2019   HGB 14.1 05/29/2019   PLT 286.0 05/29/2019   NA 135 06/04/2019   K 4.9 06/04/2019   CL 98 06/04/2019   CO2 30 06/04/2019   GLUCOSE 85 06/04/2019   BUN 21 06/04/2019   CREATININE 0.91 06/04/2019   BILITOT 0.4 12/26/2018   ALKPHOS 105 12/26/2018   AST 20 12/26/2018   ALT 20 12/26/2018   PROT 7.0 12/26/2018   ALBUMIN 4.5 12/26/2018   CALCIUM 10.1 06/04/2019   GFRAA 68 12/15/2016    Speciality Comments: No specialty comments available.  Procedures:  No procedures performed Allergies: Codeine phosphate   Assessment / Plan:     Visit Diagnoses: No  diagnosis found.  Orders: No orders of the defined types were placed in this encounter.  No orders of the defined types were placed in this encounter.   Face-to-face time spent with patient was *** minutes. Greater than 50% of time was spent in counseling and coordination of care.  Follow-Up Instructions: No follow-ups on file.   Ellen Henri, CMA  Note - This record has been created using Animal nutritionist.  Chart creation errors have been sought, but may not always  have been located. Such creation errors do not reflect on  the standard of medical care.

## 2020-07-16 ENCOUNTER — Telehealth: Payer: Self-pay | Admitting: Internal Medicine

## 2020-07-16 NOTE — Telephone Encounter (Signed)
Pt call and stated she want dr. Regis Bill to sent her a RX for tramadol sent to  [facility], [address] AT [place] Phone:  [phone]  Fax:  [phone]     and would like a call back.

## 2020-07-16 NOTE — Telephone Encounter (Signed)
Last OV:  03/12/2020 Last Refill: 10/10/2019  Documenting provider: Historical  Future OV: 08/04/2020

## 2020-07-28 ENCOUNTER — Ambulatory Visit: Payer: Medicare Other | Admitting: Rheumatology

## 2020-07-28 DIAGNOSIS — G8929 Other chronic pain: Secondary | ICD-10-CM

## 2020-07-28 DIAGNOSIS — Z9889 Other specified postprocedural states: Secondary | ICD-10-CM

## 2020-07-28 DIAGNOSIS — R748 Abnormal levels of other serum enzymes: Secondary | ICD-10-CM

## 2020-07-28 DIAGNOSIS — M8589 Other specified disorders of bone density and structure, multiple sites: Secondary | ICD-10-CM

## 2020-07-28 DIAGNOSIS — Z8261 Family history of arthritis: Secondary | ICD-10-CM

## 2020-07-28 DIAGNOSIS — M5136 Other intervertebral disc degeneration, lumbar region: Secondary | ICD-10-CM

## 2020-07-28 DIAGNOSIS — Z8719 Personal history of other diseases of the digestive system: Secondary | ICD-10-CM

## 2020-07-28 DIAGNOSIS — M19072 Primary osteoarthritis, left ankle and foot: Secondary | ICD-10-CM

## 2020-07-28 DIAGNOSIS — Z8679 Personal history of other diseases of the circulatory system: Secondary | ICD-10-CM

## 2020-07-28 DIAGNOSIS — M5030 Other cervical disc degeneration, unspecified cervical region: Secondary | ICD-10-CM

## 2020-07-28 DIAGNOSIS — Z96652 Presence of left artificial knee joint: Secondary | ICD-10-CM

## 2020-07-28 DIAGNOSIS — Z96659 Presence of unspecified artificial knee joint: Secondary | ICD-10-CM

## 2020-07-28 DIAGNOSIS — M19042 Primary osteoarthritis, left hand: Secondary | ICD-10-CM

## 2020-07-28 NOTE — Telephone Encounter (Signed)
I have not been managing her narcotic pain management. See last note by specialist Please refer refill request to her [place].  T

## 2020-07-29 ENCOUNTER — Ambulatory Visit: Payer: Medicare Other | Admitting: Internal Medicine

## 2020-08-03 ENCOUNTER — Other Ambulatory Visit: Payer: Self-pay

## 2020-08-03 NOTE — Progress Notes (Signed)
Chief Complaint  Patient presents with   Medication Consultation    HPI: Briana Giles 75 y.o. come in for medication evaluation.  Last visit with me was July 2021 She has been under care by Dr. Reynolds Bowl for rheumatology for a number of years for arthritis degenerative and osteo-.  She has been on tramadol for quite a while which she takes once a day to help her through her day in addition to her Tylenol and Celebrex or anti-inflammatory. When she called rheumatology office recently was told that Dr. Keturah Barre did not want to prescribe it anymore. She has been off the tramadol for about a month has noticed somewhat more difficult when she gets up in the morning knee pain sometimes shoulder. Usually takes medicine once a day and helps her on active days.  She apparently has had a substance controlled contract in the past with urine screens.  She has ongoing sleep issues insomnia as in the past trazodone and will take Unisom at night husband had some concern that it could affect her memory.  Otherwise no new serious problems Has had a left total knee replacement and a partial on the right may need more surgery for function.   ROS: See pertinent positives and negatives per HPI.  Past Medical History:  Diagnosis Date   Abdominal pain, other specified site    Abnormal LFTs    Allergic rhinitis    Cervical back pain with evidence of disc disease    GERD (gastroesophageal reflux disease)    Heart murmur    says all her life had echo in [place] year ago not to worry   Hypertension    Nerve damage    left arm   Osteoarthritis    Osteopenia    Phlebitis    Spinal stenosis    Transient global amnesia     Family History  Problem Relation Age of Onset   Hypertension Father    Alcohol abuse Father    Lung cancer Father    Cancer Maternal Grandmother        sinus   COPD Mother    Osteoporosis Mother    Heart disease Mother    Hypertension Brother    Heart Problems Brother     Arthritis Brother    Prostate cancer Maternal Grandfather    Heart attack Brother    Hypertension Brother    Healthy Daughter    Healthy Son    Colon cancer Neg Hx    Pancreatic cancer Neg Hx    Stomach cancer Neg Hx     Social History   Socioeconomic History   Marital status: Married    Spouse name: Not on file   Number of children: 2   Years of education: Not on file   Highest education level: Not on file  Occupational History   Occupation: retired Product manager: RETIRED  Tobacco Use   Smoking status: Never   Smokeless tobacco: Never  Vaping Use   Vaping Use: Never used  Substance and Sexual Activity   Alcohol use: Yes    Comment: occasional    Drug use: Never   Sexual activity: Not on file  Other Topics Concern   Not on file  Social History Narrative   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  [place] grand child    Does water exercise aerobics walking elliptical  Social Determinants of Health   Financial Resource Strain: Low Risk    Difficulty of Paying Living Expenses: Not hard at all  Food Insecurity: No Food Insecurity   Worried About Charity fundraiser in the Last Year: Never true   [place] in the Last Year: Never true  Transportation Needs: No Transportation Needs   Lack of Transportation (Medical): No   Lack of Transportation (Non-Medical): No  Physical Activity: Sufficiently Active   Days of Exercise per Week: 7 days   Minutes of Exercise per Session: 60 min  Stress: No Stress Concern Present   Feeling of Stress : Not at all  Social Connections: Moderately Isolated   Frequency of Communication with Friends and Family: More than three times a week   Frequency of Social Gatherings with Friends and Family: More than three times a week   Attends Religious Services: Never   Marine scientist or Organizations: No   Attends Archivist Meetings: Never    Marital Status: Married    Outpatient Medications Prior to Visit  Medication Sig Dispense Refill   acetaminophen (TYLENOL) 500 MG tablet Take 1,000 mg by mouth every 6 (six) hours as needed for mild pain, moderate pain or headache.     fluticasone (FLONASE) 50 MCG/ACT nasal spray USE TWO SPRAYS IN EACH NOSTRIL ONCE DAILY. (Patient taking differently: as needed.) 16 g 5   lisinopril (ZESTRIL) 20 MG tablet TAKE 1 TABLET BY MOUTH EVERY DAY 90 tablet 2   polyethylene glycol powder (MIRALAX) powder Take 17 g by mouth once. (Patient taking differently: Take 17 g by mouth as needed.) 255 g 0   tiZANidine (ZANAFLEX) 4 MG tablet Take 1 tablet (4 mg total) by mouth at bedtime. 30 tablet 0   traZODone (DESYREL) 50 MG tablet TAKE 1 TABLET(50 MG) BY MOUTH AT BEDTIME AS NEEDED FOR SLEEP. MAY INCREASE TO 1 AND 1/2 TABLETS 75 MG DOSE 30 tablet 1   traMADol (ULTRAM) 50 MG tablet Take 50 mg by mouth daily as needed.     No facility-administered medications prior to visit.     EXAM:  BP 140/70 (BP Location: Left Arm, Patient Position: Sitting, Cuff Size: Normal)   Pulse 86   Temp 97.9 F (36.6 C) (Oral)   Ht 5\' 1"  (1.549 m)   Wt 136 lb (61.7 kg)   SpO2 98%   BMI 25.70 kg/m   Body mass index is 25.7 kg/m.  GENERAL: vitals reviewed and listed above, alert, oriented, appears well hydrated and in no acute distress HEENT: atraumatic, conjunctiva  clear, no obvious abnormalities on inspection of external nose and ears OP : Masked NECK: no obvious masses on inspection palpation  LMS: moves all extremities without noticeable focal  abnormality acute joint effusions PSYCH: pleasant and cooperative, no obvious depression or anxiety Lab Results  Component Value Date   WBC 7.8 05/29/2019   HGB 14.1 05/29/2019   HCT 42.6 05/29/2019   PLT 286.0 05/29/2019   GLUCOSE 85 06/04/2019   CHOL 242 (H) 12/26/2018   TRIG 130.0 12/26/2018   HDL 65.30 12/26/2018   LDLDIRECT 127.4 12/27/2011   LDLCALC 151 (H)  12/26/2018   ALT 20 12/26/2018   AST 20 12/26/2018   NA 135 06/04/2019   K 4.9 06/04/2019   CL 98 06/04/2019   CREATININE 0.91 06/04/2019   BUN 21 06/04/2019   CO2 30 06/04/2019   TSH 1.97 12/26/2018   INR 0.9 05/29/2019   BP Readings  from Last 3 Encounters:  08/04/20 140/70  03/12/20 116/68  01/28/20 110/68  Record review no recent labs seen and no note from rheumatology or a phone message seen.  ASSESSMENT AND PLAN:  Discussed the following assessment and plan:  Osteoarthritis, unspecified osteoarthritis type, unspecified site - Plan: Basic metabolic panel, CBC with Differential/Platelet, Hepatic function panel, Lipid panel  Medication management - Plan: Basic metabolic panel, CBC with Differential/Platelet, Hepatic function panel, Lipid panel  Essential hypertension - Plan: Basic metabolic panel, CBC with Differential/Platelet, Hepatic function panel, Lipid panel  Hyperlipidemia, unspecified hyperlipidemia type - Plan: Basic metabolic panel, CBC with Differential/Platelet, Hepatic function panel, Lipid panel Overdue for lab monitoring based on record review Has been on most daily tramadol for years and specialist no longer wants to prescribe. Counseled would have her avoid regular use of controlled substances for her situation and to use as needed as add on.  Plan refill fill #30 tramadol. Get CPX appointment when she has her wellness visit in September get fasting labs in interim we will place orders today Contact us for refills we will decide next step. Limit medications for sleep as possible assess her risk benefit.  Answer usually not that helpful for good sleep.  FYI appears to be a pain agreement January 0737 uncertain situation of being told to go to PCP for further pain management. (Her last visit was reported is in no shape show but she states she called ahead and it was not canceled.) -Patient advised to return or notify health care team  if  new concerns  arise.  Patient Instructions    Get fasting lab   before next appt  in September.   Filling 30 tramadol today  contact us for refills in interim . Use  as needed  .    Standley Brooking. Chantella Creech M.D.

## 2020-08-04 ENCOUNTER — Encounter: Payer: Self-pay | Admitting: Internal Medicine

## 2020-08-04 ENCOUNTER — Ambulatory Visit (INDEPENDENT_AMBULATORY_CARE_PROVIDER_SITE_OTHER): Payer: Medicare Other | Admitting: Internal Medicine

## 2020-08-04 VITALS — BP 140/70 | HR 86 | Temp 97.9°F | Ht 61.0 in | Wt 136.0 lb

## 2020-08-04 DIAGNOSIS — I10 Essential (primary) hypertension: Secondary | ICD-10-CM

## 2020-08-04 DIAGNOSIS — E785 Hyperlipidemia, unspecified: Secondary | ICD-10-CM

## 2020-08-04 DIAGNOSIS — M1990 Unspecified osteoarthritis, unspecified site: Secondary | ICD-10-CM | POA: Diagnosis not present

## 2020-08-04 DIAGNOSIS — Z79899 Other long term (current) drug therapy: Secondary | ICD-10-CM

## 2020-08-04 MED ORDER — TRAMADOL HCL 50 MG PO TABS: 50.0000 mg | ORAL_TABLET | Freq: Every day | ORAL | 0 refills | Status: DC | PRN

## 2020-08-04 NOTE — Patient Instructions (Signed)
   Get fasting lab   before next appt  in September.   Filling 30 tramadol today  contact us for refills in interim . Use  as needed  .

## 2020-08-12 ENCOUNTER — Telehealth: Payer: Self-pay

## 2020-08-12 NOTE — Telephone Encounter (Signed)
Pt informed of the message below.

## 2020-08-12 NOTE — Telephone Encounter (Signed)
-----   Message from Burnis Medin, MD sent at 08/09/2020  6:40 PM EDT ----- Regarding: tramadol rx Nzinga Ferran please tell ms Bursick that  I contacted Dr August Luz  and she said she  will  prescribe  and take back  prescribing the  tramadol.  So next rx please contact her office . Thanks WP ----- Message ----- From: Bo Merino, MD Sent: 08/04/2020   1:02 PM EDT To: Burnis Medin, MD  She had a discussion with my PA at the last visit.  We will take over the prescription of tramadol. Thank you thank youThank you,  Abel Presto ----- Message ----- From: Burnis Medin, MD Sent: 08/04/2020  12:07 PM EDT To: Bo Merino, MD  SD,  Patient said  she was told by your staff that you no longer would prescribe the tramadol but I dont see a  message or note to that  effect .  I wrote for 30 #  tramodol but I usually dont do chronic  pain controlled substances  but could  do prn use . Please advise what you think for her situation.   I think knees could o be evaluated by ortho   in near  future to help. She also said she called about cancellation for last visit June.  and it seems to be in the system as a no show.  Thanks for your input.  WP

## 2020-09-09 ENCOUNTER — Other Ambulatory Visit: Payer: Self-pay

## 2020-09-09 ENCOUNTER — Other Ambulatory Visit: Payer: Self-pay | Admitting: Internal Medicine

## 2020-09-09 MED ORDER — TRAMADOL HCL 50 MG PO TABS: 50.0000 mg | ORAL_TABLET | Freq: Every day | ORAL | 0 refills | Status: DC | PRN

## 2020-09-09 NOTE — Progress Notes (Signed)
Office Visit Note  Patient: Briana Giles             Date of Birth: May 23, 1945           MRN: [id number]             PCP: Madelin Headings, MD Referring: Madelin Headings, MD Visit Date: 09/10/2020 Occupation: @GUAROCC @  Subjective:  Medication management.   History of Present Illness: ANACLARA BUFKIN is a 75 y.o. female with history of osteoarthritis and degenerative disc disease.  She states she continues to have pain and discomfort in her bilateral hands and bilateral feet.  Her knee joints continues to hurt.  She had bilateral knee joint replacement partial on the right and total knee replacement on the left.  She has been experiencing increased pain in the neck and knee joints.  She would like a referral to physical therapy.  Activities of Daily Living:  Patient reports morning stiffness for 20 minutes.   Patient Denies nocturnal pain.  Difficulty dressing/grooming: Denies Difficulty climbing stairs: Reports Difficulty getting out of chair: Denies Difficulty using hands for taps, buttons, cutlery, and/or writing: Denies  Review of Systems  Constitutional:  Negative for fatigue.  HENT:  Negative for mouth sores, mouth dryness and nose dryness.   Eyes:  Negative for pain, itching and dryness.  Respiratory:  Negative for shortness of breath and difficulty breathing.   Cardiovascular:  Negative for chest pain and palpitations.  Gastrointestinal:  Negative for blood in stool, constipation and diarrhea.  Endocrine: Negative for increased urination.  Genitourinary:  Negative for difficulty urinating.  Musculoskeletal:  Positive for myalgias, morning stiffness and myalgias. Negative for joint pain, joint pain, joint swelling and muscle tenderness.  Skin:  Negative for color change, rash and redness.  Allergic/Immunologic: Negative for susceptible to infections.  Neurological:  Negative for dizziness, numbness, headaches, memory loss and weakness.  Hematological:  Positive for  bruising/bleeding tendency.  Psychiatric/Behavioral:  Negative for confusion.    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   Abdominal bruit 12/31/2012   Encounter for Medicare annual wellness exam 12/31/2012   Degenerative joint disease of spine multilevel 03/07/2012   Spinal stenosis, lumbar 03/07/2012   Hip pain 09/02/2011   Sciatica of left side 08/14/2011   Spinal stenosis of lumbar region 08/14/2011   Encounter for long-term (current) use of other medications 06/25/2011   High risk medication use 12/21/2010   Need for lipid screening 12/21/2010   Foot pain, left 07/22/2010   CHEST PAIN 02/18/2010   Osteopenia 07/05/2009   TREMOR, RIGHT HAND 01/26/2009   BACK PAIN 09/30/2008   NEOPLASM, SKIN, UNCERTAIN BEHAVIOR 05/27/2008   HYPERPOTASSEMIA 05/27/2008   Essential hypertension 10/12/2007   TRANSIENT GLOBAL AMNESIA 10/12/2007   VARICOSE VEINS, LOWER EXTREMITIES 06/06/2007   SYSTOLIC MURMUR 06/06/2007   ABDOMINAL BRUIT 06/06/2007   ALLERGIC RHINITIS 08/17/2006   GERD 08/17/2006   Osteoarthritis 08/17/2006   Disturbance in sleep behavior 08/17/2006    Past Medical History:  Diagnosis Date  Abdominal pain, other specified site    Abnormal LFTs    Allergic rhinitis    Cervical back pain with evidence of disc disease    GERD (gastroesophageal reflux disease)    Heart murmur    says all her life had echo in [place] year ago not to worry   Hypertension    Nerve damage     left arm   Osteoarthritis    Osteopenia    Phlebitis    Spinal stenosis    Transient global amnesia     Family History  Problem Relation Age of Onset   Hypertension Father    Alcohol abuse Father    Lung cancer Father    Cancer Maternal Grandmother        sinus   COPD Mother    Osteoporosis Mother    Heart disease Mother    Hypertension Brother    Heart Problems Brother    Arthritis Brother    Prostate cancer Maternal Grandfather    Heart attack Brother    Hypertension Brother    Healthy Daughter    Healthy Son    Colon cancer Neg Hx    Pancreatic cancer Neg Hx    Stomach cancer Neg Hx    Past Surgical History:  Procedure Laterality Date   BUNIONECTOMY     fall 2022 [facility]   CHOLECYSTECTOMY     CYST EXCISION Right 01/2018   right thumb    FOOT SURGERY      x 2     HAND SURGERY     right hand   KNEE ARTHROSCOPY     right   REPLACEMENT UNICONDYLAR JOINT KNEE     rt knee   TONSILLECTOMY     TOTAL KNEE ARTHROPLASTY Left 06/2019   Social History   Social History Narrative   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  [place] grand child    Does water exercise aerobics walking elliptical    Immunization History  Administered Date(s) Administered   Influenza Split 12/27/2011, 10/15/2012   Influenza Whole 12/03/2007, 12/24/2009, 10/25/2010   Influenza, High Dose Seasonal PF 12/31/2013, 11/06/2014, 12/10/2016, 10/25/2017, 10/31/2018   Influenza,inj,quad, With Preservative 11/10/2017   Influenza-Unspecified 11/14/2016   PFIZER(Purple Top)SARS-COV-2 Vaccination 03/08/2019, 03/30/2019, 10/30/2019   Pneumococcal Conjugate-13 01/06/2014   Pneumococcal Polysaccharide-23 12/21/2010   Td 05/27/2008   Tdap 01/06/2014   Zoster Recombinat (Shingrix) 10/26/2017, 10/31/2018   Zoster, Live 12/03/2007     Objective: Vital Signs: BP 138/71 (BP Location: Left Arm, Patient Position: Sitting, Cuff Size:  Normal)   Pulse 84   Ht 5\' 1"  (1.549 m)   Wt 135 lb 9.6 oz (61.5 kg)   BMI 25.62 kg/m    Physical Exam Vitals and nursing note reviewed.  Constitutional:      Appearance: She is well-developed.  HENT:     Head: Normocephalic and atraumatic.  Eyes:     Conjunctiva/sclera: Conjunctivae normal.  Cardiovascular:     Rate and Rhythm: Normal rate and regular rhythm.     Heart sounds: Normal heart sounds.  Pulmonary:     Effort: Pulmonary effort is normal.     Breath sounds: Normal breath sounds.  Abdominal:     General: Bowel sounds are normal.     Palpations: Abdomen is soft.  Musculoskeletal:     Cervical back: Normal range of motion.  Lymphadenopathy:  Cervical: No cervical adenopathy.  Skin:    General: Skin is warm and dry.     Capillary Refill: Capillary refill takes less than 2 seconds.  Neurological:     Mental Status: She is alert and oriented to person, place, and time.  Psychiatric:        Behavior: Behavior normal.     Musculoskeletal Exam: She has good range of motion of cervical and lumbar spine.  Thoracic kyphosis was noted.  Shoulder joints, elbow joints, wrist joints with good range of motion.  She had bilateral PIP and DIP thickening.  Hip joints and knee joints in good range of motion.  Knee joints are replaced.  There is no tenderness over ankles or MTPs.  She had generalized hyperalgesia.  CDAI Exam: CDAI Score: -- Patient Global: --; Provider Global: -- Swollen: --; Tender: -- Joint Exam 09/10/2020   No joint exam has been documented for this visit   There is currently no information documented on the homunculus. Go to the Rheumatology activity and complete the homunculus joint exam.  Investigation: No additional findings.  Imaging: No results found.  Recent Labs: Lab Results  Component Value Date   WBC 7.8 05/29/2019   HGB 14.1 05/29/2019   PLT 286.0 05/29/2019   NA 135 06/04/2019   K 4.9 06/04/2019   CL 98 06/04/2019   CO2 30  06/04/2019   GLUCOSE 85 06/04/2019   BUN 21 06/04/2019   CREATININE 0.91 06/04/2019   BILITOT 0.4 12/26/2018   ALKPHOS 105 12/26/2018   AST 20 12/26/2018   ALT 20 12/26/2018   PROT 7.0 12/26/2018   ALBUMIN 4.5 12/26/2018   CALCIUM 10.1 06/04/2019   GFRAA 68 12/15/2016    Speciality Comments: No specialty comments available.  Procedures:  No procedures performed Allergies: Codeine phosphate   Assessment / Plan:     Visit Diagnoses: Primary osteoarthritis of both hands -she continues to have pain and discomfort in her bilateral hands.  PIP and DIP thickening was noted.  No synovitis was noted.  A handout on hand exercises was given.  She has been taking tramadol for pain management.  Tramadol refilled 09/09/20. UDS due today  S/P total knee arthroplasty, left -chronic pain.  A handout on lower extremity muscle strengthening exercises was given. - Plan: Ambulatory referral to Physical Therapy  History of partial knee replacement -chronic pain right - Plan: Ambulatory referral to Physical Therapy  Primary osteoarthritis of both feet-she has some discomfort in her feet.  History of bunionectomy  DDD (degenerative disc disease), cervical -she has ongoing pain and discomfort in her cervical region.  -Chronic pain.  Plan: Ambulatory referral to Physical Therapy  DDD (degenerative disc disease), lumbar  Other chronic pain-she is on tramadol for chronic pain.  She was getting tramadol through her PCP and wanted [place] to take over the prescription.  Patient reported having labs through her PCP.  I advised her to send a copy of the lab results to [place].  Medication monitoring encounter - Plan: DRUG MONITOR, TRAMADOL,QN, URINE, DRUG MONITOR, PANEL 5, W/CONF, URINE  Osteopenia of multiple sites-her last bone density was in 2017.  She has been advised to get a follow-up DEXA scan with her PCP.  Diet rich in calcium, vitamin D and resistive exercises were discussed.  Family history of rheumatoid  arthritis-patient had no synovitis on my examination.  Abnormal serum angiotensin-converting enzyme level  History of hypertension  History of gastroesophageal reflux (GERD)  History of varicose veins  Orders:  Orders Placed This Encounter  Procedures   DRUG MONITOR, TRAMADOL,QN, URINE   DRUG MONITOR, PANEL 5, W/CONF, URINE   Ambulatory referral to Physical Therapy    No orders of the defined types were placed in this encounter.    Follow-Up Instructions: Return in about 6 months (around 03/13/2021) for Osteoarthritis.   Pollyann Savoy, MD  Note - This record has been created using Animal nutritionist.  Chart creation errors have been sought, but may not always  have been located. Such creation errors do not reflect on  the standard of medical care.

## 2020-09-09 NOTE — Telephone Encounter (Signed)
Patient called requesting prescription refill of Tramadol to be sent to [facility].  Patient states she only has 2 pills remaining.  Patient states the pharmacy has requested the refill, but hasn't received a response.

## 2020-09-09 NOTE — Telephone Encounter (Signed)
Last Visit: 01/28/2020  Next Visit: 09/10/2020  UDS: 01/24/2020, Consistent with tramadol use, tramadol 50 mg daily as needed   Narc Agreement: 01/28/2020     Okay to refill Tramadol?

## 2020-09-10 ENCOUNTER — Encounter: Payer: Self-pay | Admitting: Rheumatology

## 2020-09-10 ENCOUNTER — Other Ambulatory Visit: Payer: Self-pay

## 2020-09-10 ENCOUNTER — Ambulatory Visit (INDEPENDENT_AMBULATORY_CARE_PROVIDER_SITE_OTHER): Payer: Medicare Other | Admitting: Rheumatology

## 2020-09-10 VITALS — BP 138/71 | HR 84 | Ht 61.0 in | Wt 135.6 lb

## 2020-09-10 DIAGNOSIS — M19041 Primary osteoarthritis, right hand: Secondary | ICD-10-CM | POA: Diagnosis not present

## 2020-09-10 DIAGNOSIS — Z96659 Presence of unspecified artificial knee joint: Secondary | ICD-10-CM

## 2020-09-10 DIAGNOSIS — Z8261 Family history of arthritis: Secondary | ICD-10-CM

## 2020-09-10 DIAGNOSIS — Z8679 Personal history of other diseases of the circulatory system: Secondary | ICD-10-CM

## 2020-09-10 DIAGNOSIS — M19072 Primary osteoarthritis, left ankle and foot: Secondary | ICD-10-CM

## 2020-09-10 DIAGNOSIS — Z9889 Other specified postprocedural states: Secondary | ICD-10-CM

## 2020-09-10 DIAGNOSIS — R748 Abnormal levels of other serum enzymes: Secondary | ICD-10-CM

## 2020-09-10 DIAGNOSIS — M19071 Primary osteoarthritis, right ankle and foot: Secondary | ICD-10-CM

## 2020-09-10 DIAGNOSIS — Z8719 Personal history of other diseases of the digestive system: Secondary | ICD-10-CM

## 2020-09-10 DIAGNOSIS — Z5181 Encounter for therapeutic drug level monitoring: Secondary | ICD-10-CM

## 2020-09-10 DIAGNOSIS — M8589 Other specified disorders of bone density and structure, multiple sites: Secondary | ICD-10-CM

## 2020-09-10 DIAGNOSIS — M5136 Other intervertebral disc degeneration, lumbar region: Secondary | ICD-10-CM

## 2020-09-10 DIAGNOSIS — M5030 Other cervical disc degeneration, unspecified cervical region: Secondary | ICD-10-CM

## 2020-09-10 DIAGNOSIS — Z96652 Presence of left artificial knee joint: Secondary | ICD-10-CM | POA: Diagnosis not present

## 2020-09-10 DIAGNOSIS — M19042 Primary osteoarthritis, left hand: Secondary | ICD-10-CM

## 2020-09-10 DIAGNOSIS — G8929 Other chronic pain: Secondary | ICD-10-CM

## 2020-09-10 NOTE — Patient Instructions (Signed)
Journal for Nurse Practitioners, 15(4), 263-267. Retrieved November 20, 2017 from [email or web address]">  Knee Exercises Ask your health care provider which exercises are safe for you. Do exercises exactly as told by your health care provider and adjust them as directed. It is normal to feel mild stretching, pulling, tightness, or discomfort as you do these exercises. Stop right away if you feel sudden pain or your pain gets worse. Do not begin these exercises until told by your health care provider. Stretching and range-of-motion exercises These exercises warm up your muscles and joints and improve the movement and flexibility of your knee. These exercises also help to relieve pain andswelling. Knee extension, prone Lie on your abdomen (prone position) on a bed. Place your left / right knee just beyond the edge of the surface so your knee is not on the bed. You can put a towel under your left / right thigh just above your kneecap for comfort. Relax your leg muscles and allow gravity to straighten your knee (extension). You should feel a stretch behind your left / right knee. Hold this position for __________ seconds. Scoot up so your knee is supported between repetitions. Repeat __________ times. Complete this exercise __________ times a day. Knee flexion, active  Lie on your back with both legs straight. If this causes back discomfort, bend your left / right knee so your foot is flat on the floor. Slowly slide your left / right heel back toward your buttocks. Stop when you feel a gentle stretch in the front of your knee or thigh (flexion). Hold this position for __________ seconds. Slowly slide your left / right heel back to the starting position. Repeat __________ times. Complete this exercise __________ times a day. Quadriceps stretch, prone  Lie on your abdomen on a firm surface, such as a bed or padded floor. Bend your left / right knee and hold your ankle. If you cannot reach  your ankle or pant leg, loop a belt around your foot and grab the belt instead. Gently pull your heel toward your buttocks. Your knee should not slide out to the side. You should feel a stretch in the front of your thigh and knee (quadriceps). Hold this position for __________ seconds. Repeat __________ times. Complete this exercise __________ times a day. Hamstring, supine Lie on your back (supine position). Loop a belt or towel over the ball of your left / right foot. The ball of your foot is on the walking surface, right under your toes. Straighten your left / right knee and slowly pull on the belt to raise your leg until you feel a gentle stretch behind your knee (hamstring). Do not let your knee bend while you do this. Keep your other leg flat on the floor. Hold this position for __________ seconds. Repeat __________ times. Complete this exercise __________ times a day. Strengthening exercises These exercises build strength and endurance in your knee. Endurance is theability to use your muscles for a long time, even after they get tired. Quadriceps, isometric This exercise stretches the muscles in front of your thigh (quadriceps) without moving your knee joint (isometric). Lie on your back with your left / right leg extended and your other knee bent. Put a rolled towel or small pillow under your knee if told by your health care provider. Slowly tense the muscles in the front of your left / right thigh. You should see your kneecap slide up toward your hip or see increased dimpling just above the knee. This motion will   push the back of the knee toward the floor. For __________ seconds, hold the muscle as tight as you can without increasing your pain. Relax the muscles slowly and completely. Repeat __________ times. Complete this exercise __________ times a day. Straight leg raises This exercise stretches the muscles in front of your thigh (quadriceps) and the muscles that move your hips (hip  flexors). Lie on your back with your left / right leg extended and your other knee bent. Tense the muscles in the front of your left / right thigh. You should see your kneecap slide up or see increased dimpling just above the knee. Your thigh may even shake a bit. Keep these muscles tight as you raise your leg 4-6 inches (10-15 cm) off the floor. Do not let your knee bend. Hold this position for __________ seconds. Keep these muscles tense as you lower your leg. Relax your muscles slowly and completely after each repetition. Repeat __________ times. Complete this exercise __________ times a day. Hamstring, isometric Lie on your back on a firm surface. Bend your left / right knee about __________ degrees. Dig your left / right heel into the surface as if you are trying to pull it toward your buttocks. Tighten the muscles in the back of your thighs (hamstring) to "dig" as hard as you can without increasing any pain. Hold this position for __________ seconds. Release the tension gradually and allow your muscles to relax completely for __________ seconds after each repetition. Repeat __________ times. Complete this exercise __________ times a day. Hamstring curls If told by your health care provider, do this exercise while wearing ankle weights. Begin with __________ lb weights. Then increase the weight by 1 lb (0.5 kg) increments. Do not wear ankle weights that are more than __________ lb. Lie on your abdomen with your legs straight. Bend your left / right knee as far as you can without feeling pain. Keep your hips flat against the floor. Hold this position for __________ seconds. Slowly lower your leg to the starting position. Repeat __________ times. Complete this exercise __________ times a day. Squats This exercise strengthens the muscles in front of your thigh and knee (quadriceps). Stand in front of a table, with your feet and knees pointing straight ahead. You may rest your hands on the  table for balance but not for support. Slowly bend your knees and lower your hips like you are going to sit in a chair. Keep your weight over your heels, not over your toes. Keep your lower legs upright so they are parallel with the table legs. Do not let your hips go lower than your knees. Do not bend lower than told by your health care provider. If your knee pain increases, do not bend as low. Hold the squat position for __________ seconds. Slowly push with your legs to return to standing. Do not use your hands to pull yourself to standing. Repeat __________ times. Complete this exercise __________ times a day. Wall slides This exercise strengthens the muscles in front of your thigh and knee (quadriceps). Lean your back against a smooth wall or door, and walk your feet out 18-24 inches (46-61 cm) from it. Place your feet hip-width apart. Slowly slide down the wall or door until your knees bend __________ degrees. Keep your knees over your heels, not over your toes. Keep your knees in line with your hips. Hold this position for __________ seconds. Repeat __________ times. Complete this exercise __________ times a day. Straight leg raises This exercise   strengthens the muscles that rotate the leg at the hip and move it away from your body (hip abductors). Lie on your side with your left / right leg in the top position. Lie so your head, shoulder, knee, and hip line up. You may bend your bottom knee to help you keep your balance. Roll your hips slightly forward so your hips are stacked directly over each other and your left / right knee is facing forward. Leading with your heel, lift your top leg 4-6 inches (10-15 cm). You should feel the muscles in your outer hip lifting. Do not let your foot drift forward. Do not let your knee roll toward the ceiling. Hold this position for __________ seconds. Slowly return your leg to the starting position. Let your muscles relax completely after each  repetition. Repeat __________ times. Complete this exercise __________ times a day. Straight leg raises This exercise stretches the muscles that move your hips away from the front of the pelvis (hip extensors). Lie on your abdomen on a firm surface. You can put a pillow under your hips if that is more comfortable. Tense the muscles in your buttocks and lift your left / right leg about 4-6 inches (10-15 cm). Keep your knee straight as you lift your leg. Hold this position for __________ seconds. Slowly lower your leg to the starting position. Let your leg relax completely after each repetition. Repeat __________ times. Complete this exercise __________ times a day. This information is not intended to replace advice given to you by your health care provider. Make sure you discuss any questions you have with your healthcare provider. Document Revised: 11/21/2017 Document Reviewed: 11/21/2017 Elsevier Patient Education  2022 [place]. Cervical Strain and Sprain Rehab Ask your health care provider which exercises are safe for you. Do exercises exactly as told by your health care provider and adjust them as directed. It is normal to feel mild stretching, pulling, tightness, or discomfort as you do these exercises. Stop right away if you feel sudden pain or your pain gets worse. Do not begin these exercises until told by your health care provider. Stretching and range-of-motion exercises Cervical side bending  Using good posture, sit on a stable chair or stand up. Without moving your shoulders, slowly tilt your left / right ear to your shoulder until you feel a stretch in the opposite side neck muscles. You should be looking straight ahead. Hold for __________ seconds. Repeat with the other side of your neck. Repeat __________ times. Complete this exercise __________ times a day. Cervical rotation  Using good posture, sit on a stable chair or stand up. Slowly turn your head to the side as if  you are looking over your left / right shoulder. Keep your eyes level with the ground. Stop when you feel a stretch along the side and the back of your neck. Hold for __________ seconds. Repeat this by turning to your other side. Repeat __________ times. Complete this exercise __________ times a day. Thoracic extension and pectoral stretch Roll a towel or a small blanket so it is about 4 inches (10 cm) in diameter. Lie down on your back on a firm surface. Put the towel lengthwise, under your spine in the middle of your back. It should not be under your shoulder blades. The towel should line up with your spine from your middle back to your lower back. Put your hands behind your head and let your elbows fall out to your sides. Hold for __________ seconds. Repeat  __________ times. Complete this exercise __________ times a day. Strengthening exercises Isometric upper cervical flexion Lie on your back with a thin pillow behind your head and a small rolled-up towel under your neck. Gently tuck your chin toward your chest and nod your head down to look toward your feet. Do not lift your head off the pillow. Hold for __________ seconds. Release the tension slowly. Relax your neck muscles completely before you repeat this exercise. Repeat __________ times. Complete this exercise __________ times a day. Isometric cervical extension  Stand about 6 inches (15 cm) away from a wall, with your back facing the wall. Place a soft object, about 6-8 inches (15-20 cm) in diameter, between the back of your head and the wall. A soft object could be a small pillow, a ball, or a folded towel. Gently tilt your head back and press into the soft object. Keep your jaw and forehead relaxed. Hold for __________ seconds. Release the tension slowly. Relax your neck muscles completely before you repeat this exercise. Repeat __________ times. Complete this exercise __________ times a day. Posture and body mechanics Body  mechanics refers to the movements and positions of your body while you do your daily activities. Posture is part of body mechanics. Good posture and healthy body mechanics can help to relieve stress in your body's tissues and joints. Good posture means that your spine is in its natural S-curve position (your spine is neutral), your shoulders are pulled back slightly, and your head is not tipped forward. The following are general guidelines for applying improved posture andbody mechanics to your everyday activities. Sitting  When sitting, keep your spine neutral and keep your feet flat on the floor. Use a footrest, if necessary, and keep your thighs parallel to the floor. Avoid rounding your shoulders, and avoid tilting your head forward. When working at a desk or a computer, keep your desk at a height where your hands are slightly lower than your elbows. Slide your chair under your desk so you are close enough to maintain good posture. When working at a computer, place your monitor at a height where you are looking straight ahead and you do not have to tilt your head forward or downward to look at the screen.  Standing  When standing, keep your spine neutral and keep your feet about hip-width apart. Keep a slight bend in your knees. Your ears, shoulders, and hips should line up. When you do a task in which you stand in one place for a long time, place one foot up on a stable object that is 2-4 inches (5-10 cm) high, such as a footstool. This helps keep your spine neutral.  Resting When lying down and resting, avoid positions that are most painful for you. Try to support your neck in a neutral position. You can use a contour pillow or asmall rolled-up towel. Your pillow should support your neck but not push on it. This information is not intended to replace advice given to you by your health care provider. Make sure you discuss any questions you have with your healthcare provider. Document Revised:  05/23/2018 Document Reviewed: 11/01/2017 Elsevier Patient Education  [place] Exercises Hand exercises can be helpful for almost anyone. These exercises can strengthen the hands, improve flexibility and movement, and increase blood flow to the hands. These results can make work and daily tasks easier. Hand exercises can be especially helpful for people who have joint pain from arthritis or have nerve damage from  overuse (carpal tunnel syndrome). These exercises can also help people who have injured a hand. Exercises Most of these hand exercises are gentle stretching and motion exercises. It is usually safe to do them often throughout the day. Warming up your hands before exercise may help to reduce stiffness. You can do this with gentle massage orby placing your hands in warm water for 10-15 minutes. It is normal to feel some stretching, pulling, tightness, or mild discomfort as you begin new exercises. This will gradually improve. Stop an exercise right away if you feel sudden, severe pain or your pain gets worse. Ask your healthcare provider which exercises are best for you. Knuckle bend or "claw" fist Stand or sit with your arm, hand, and all five fingers pointed straight up. Make sure to keep your wrist straight during the exercise. Gently bend your fingers down toward your palm until the tips of your fingers are touching the top of your palm. Keep your big knuckle straight and just bend the small knuckles in your fingers. Hold this position for __________ seconds. Straighten (extend) your fingers back to the starting position. Repeat this exercise 5-10 times with each hand. Full finger fist Stand or sit with your arm, hand, and all five fingers pointed straight up. Make sure to keep your wrist straight during the exercise. Gently bend your fingers into your palm until the tips of your fingers are touching the middle of your palm. Hold this position for __________ seconds. Extend  your fingers back to the starting position, stretching every joint fully. Repeat this exercise 5-10 times with each hand. Straight fist Stand or sit with your arm, hand, and all five fingers pointed straight up. Make sure to keep your wrist straight during the exercise. Gently bend your fingers at the big knuckle, where your fingers meet your hand, and the middle knuckle. Keep the knuckle at the tips of your fingers straight and try to touch the bottom of your palm. Hold this position for __________ seconds. Extend your fingers back to the starting position, stretching every joint fully. Repeat this exercise 5-10 times with each hand. Tabletop Stand or sit with your arm, hand, and all five fingers pointed straight up. Make sure to keep your wrist straight during the exercise. Gently bend your fingers at the big knuckle, where your fingers meet your hand, as far down as you can while keeping the small knuckles in your fingers straight. Think of forming a tabletop with your fingers. Hold this position for __________ seconds. Extend your fingers back to the starting position, stretching every joint fully. Repeat this exercise 5-10 times with each hand. Finger spread Place your hand flat on a table with your palm facing down. Make sure your wrist stays straight as you do this exercise. Spread your fingers and thumb apart from each other as far as you can until you feel a gentle stretch. Hold this position for __________ seconds. Bring your fingers and thumb tight together again. Hold this position for __________ seconds. Repeat this exercise 5-10 times with each hand. Making circles Stand or sit with your arm, hand, and all five fingers pointed straight up. Make sure to keep your wrist straight during the exercise. Make a circle by touching the tip of your thumb to the tip of your index finger. Hold for __________ seconds. Then open your hand wide. Repeat this motion with your thumb and each  finger on your hand. Repeat this exercise 5-10 times with each hand. Thumb motion Sit  with your forearm resting on a table and your wrist straight. Your thumb should be facing up toward the ceiling. Keep your fingers relaxed as you move your thumb. Lift your thumb up as high as you can toward the ceiling. Hold for __________ seconds. Bend your thumb across your palm as far as you can, reaching the tip of your thumb for the small finger (pinkie) side of your palm. Hold for __________ seconds. Repeat this exercise 5-10 times with each hand. Grip strengthening  Hold a stress ball or other soft ball in the middle of your hand. Slowly increase the pressure, squeezing the ball as much as you can without causing pain. Think of bringing the tips of your fingers into the middle of your palm. All of your finger joints should bend when doing this exercise. Hold your squeeze for __________ seconds, then relax. Repeat this exercise 5-10 times with each hand. Contact a health care provider if: Your hand pain or discomfort gets much worse when you do an exercise. Your hand pain or discomfort does not improve within 2 hours after you exercise. If you have any of these problems, stop doing these exercises right away. Do not do them again unless your health care provider says that you can. Get help right away if: You develop sudden, severe hand pain or swelling. If this happens, stop doing these exercises right away. Do not do them again unless your health care provider says that you can. This information is not intended to replace advice given to you by your health care provider. Make sure you discuss any questions you have with your healthcare provider. Document Revised: 05/24/2018 Document Reviewed: 02/01/2018 Elsevier Patient Education  [place].

## 2020-09-12 LAB — DRUG MONITOR, TRAMADOL,QN, URINE
Desmethyltramadol: 411 ng/mL — ABNORMAL HIGH (ref <100)
Tramadol: 985 ng/mL — ABNORMAL HIGH (ref <100)

## 2020-09-12 LAB — DRUG MONITOR, PANEL 5, W/CONF, URINE
Amphetamines: NEGATIVE ng/mL (ref <500)
Barbiturates: NEGATIVE ng/mL (ref <300)
Benzodiazepines: NEGATIVE ng/mL (ref <100)
Cocaine Metabolite: NEGATIVE ng/mL (ref <150)
Creatinine: 62.8 mg/dL (ref >=20.0)
Marijuana Metabolite: NEGATIVE ng/mL (ref <20)
Methadone Metabolite: NEGATIVE ng/mL (ref <100)
Opiates: NEGATIVE ng/mL (ref <100)
Oxidant: NEGATIVE ug/mL (ref <200)
Oxycodone: NEGATIVE ng/mL (ref <100)
pH: 7.1 (ref 4.5–9.0)

## 2020-09-12 LAB — DM TEMPLATE

## 2020-10-01 ENCOUNTER — Other Ambulatory Visit: Payer: Self-pay | Admitting: Internal Medicine

## 2020-10-02 NOTE — Telephone Encounter (Signed)
Noted  

## 2020-10-14 ENCOUNTER — Other Ambulatory Visit: Payer: Medicare Other

## 2020-10-20 ENCOUNTER — Other Ambulatory Visit: Payer: Self-pay | Admitting: Rheumatology

## 2020-10-20 MED ORDER — TRAMADOL HCL 50 MG PO TABS: 50.0000 mg | ORAL_TABLET | Freq: Every day | ORAL | 0 refills | Status: DC | PRN

## 2020-10-20 NOTE — Telephone Encounter (Signed)
Patient states a refill request for Tramadol was sent in last Wednesday, and it was not sent into pharmacy. Patient is going out of town, and needs refill sent in ASAP.

## 2020-10-20 NOTE — Telephone Encounter (Signed)
Next Visit: 03/16/2020  Last Visit: 09/10/2020  UDS: 09/10/2020  Narc Agreement: 09/10/2020  Last Fill: 09/09/2020  Patient is out of town and in need of a refill. Patient's pharmacy did not send over the refill request last week.   Okay to refill Tramadol?

## 2020-10-21 ENCOUNTER — Encounter: Payer: Medicare Other | Admitting: Internal Medicine

## 2020-10-21 ENCOUNTER — Ambulatory Visit: Payer: Medicare Other

## 2020-11-10 ENCOUNTER — Ambulatory Visit (INDEPENDENT_AMBULATORY_CARE_PROVIDER_SITE_OTHER): Payer: Medicare Other

## 2020-11-10 ENCOUNTER — Telehealth (INDEPENDENT_AMBULATORY_CARE_PROVIDER_SITE_OTHER): Payer: Medicare Other | Admitting: Family Medicine

## 2020-11-10 ENCOUNTER — Encounter: Payer: Self-pay | Admitting: Family Medicine

## 2020-11-10 DIAGNOSIS — Z0100 Encounter for examination of eyes and vision without abnormal findings: Secondary | ICD-10-CM | POA: Diagnosis not present

## 2020-11-10 DIAGNOSIS — Z0000 Encounter for general adult medical examination without abnormal findings: Secondary | ICD-10-CM | POA: Diagnosis not present

## 2020-11-10 DIAGNOSIS — Z780 Asymptomatic menopausal state: Secondary | ICD-10-CM | POA: Diagnosis not present

## 2020-11-10 DIAGNOSIS — U071 COVID-19: Secondary | ICD-10-CM | POA: Diagnosis not present

## 2020-11-10 MED ORDER — MOLNUPIRAVIR EUA 200MG CAPSULE: 4.0000 | ORAL_CAPSULE | Freq: Two times a day (BID) | ORAL | 0 refills | Status: AC

## 2020-11-10 MED ORDER — BENZONATATE 100 MG PO CAPS: 100.0000 mg | ORAL_CAPSULE | Freq: Three times a day (TID) | ORAL | 0 refills | Status: DC | PRN

## 2020-11-10 NOTE — Patient Instructions (Addendum)
HOME CARE TIPS:  -I sent the medication(s) we discussed to your pharmacy: Meds ordered this encounter  Medications   molnupiravir EUA (LAGEVRIO) 200 mg CAPS capsule    Sig: Take 4 capsules (800 mg total) by mouth 2 (two) times daily for 5 days.    Dispense:  40 capsule    Refill:  0   benzonatate (TESSALON PERLES) 100 MG capsule    Sig: Take 1 capsule (100 mg total) by mouth 3 (three) times daily as needed.    Dispense:  20 capsule    Refill:  0     -I sent in the [name] treatment or referral you requested per our discussion. Please see the information provided below and discuss further with the pharmacist/treatment team.   - there is a chance of rebound illness after finishing your treatment. If you become sick again please isolate for an additional 5 days.    -can use tylenol if needed for fevers, aches and pains per instructions  -can use nasal saline a few times per day if you have nasal congestion; sometimes  a short course of Afrin nasal spray for 3 days can help with symptoms as well  -stay hydrated, drink plenty of fluids and eat small healthy meals - avoid dairy  -If the Covid test is positive, check out the [facility] website for more information on home care, transmission and treatment for COVID19  -follow up with your doctor in 2-3 days unless improving and feeling better  -stay home while sick, except to seek medical care. If you have COVID19, ideally it would be best to stay home for a full 10 days since the onset of symptoms PLUS one day of no fever and feeling better. Wear a good mask that fits snugly (such as N95 or KN95) if around others to reduce the risk of transmission.  It was nice to meet you today, and I really hope you are feeling better soon. I help [facility] out with telemedicine visits on Tuesdays and Thursdays and am available for visits on those days. If you have any concerns or questions following this visit please schedule a follow up visit with your  Primary Care doctor or seek care at a local urgent care clinic to avoid delays in care.    Seek in person care or schedule a follow up video visit promptly if your symptoms worsen, new concerns arise or you are not improving with treatment. Call 911 and/or seek emergency care if your symptoms are severe or life threatening.   Fact Sheet for Patients And Caregivers Emergency Use Authorization (EUA) Of LAGEVRIOT (molnupiravir) capsules For Coronavirus Disease 2019 (COVID-19)  What is the most important information I should know about LAGEVRIO? LAGEVRIO may cause serious side effects, including: ? LAGEVRIO may cause harm to your unborn baby. It is not known if LAGEVRIO will harm your baby if you take LAGEVRIO during pregnancy. o LAGEVRIO is not recommended for use in pregnancy. o LAGEVRIO has not been studied in pregnancy. LAGEVRIO was studied in pregnant animals only. When LAGEVRIO was given to pregnant animals, LAGEVRIO caused harm to their unborn babies. o You and your healthcare provider may decide that you should take LAGEVRIO during pregnancy if there are no other COVID-19 treatment options approved or authorized by the FDA that are accessible or clinically appropriate for you. o If you and your healthcare provider decide that you should take LAGEVRIO during pregnancy, you and your healthcare provider should discuss the known and potential benefits and  the potential risks of taking LAGEVRIO during pregnancy. For individuals who are able to become pregnant: ? You should use a reliable method of birth control (contraception) consistently and correctly during treatment with LAGEVRIO and for 4 days after the last dose of LAGEVRIO. Talk to your healthcare provider about reliable birth control methods. ? Before starting treatment with [facility] your healthcare provider may do a pregnancy test to see if you are pregnant before starting treatment with LAGEVRIO. ? Tell your healthcare provider  right away if you become pregnant or think you may be pregnant during treatment with LAGEVRIO. Pregnancy Surveillance Program: ? There is a pregnancy surveillance program for individuals who take LAGEVRIO during pregnancy. The purpose of this program is to collect information about the health of you and your baby. Talk to your healthcare provider about how to take part in this program. ? If you take LAGEVRIO during pregnancy and you agree to participate in the pregnancy surveillance program and allow your healthcare provider to share your information with [place], then your healthcare provider will report your use of [place] during pregnancy to [place]. by calling [phone] or [email or web address].es. For individuals who are sexually active with partners who are able to become pregnant: ? It is not known if LAGEVRIO can affect sperm. While the risk is regarded as low, animal studies to fully assess the potential for LAGEVRIO to affect the babies of males treated with LAGEVRIO have not been completed. A reliable method of birth control (contraception) should be used consistently and correctly during treatment with LAGEVRIO and for at least 3 months after the last dose. The risk to sperm beyond 3 months is not known. Studies to understand the risk to sperm beyond 3 months are ongoing. Talk to your healthcare provider about reliable birth control methods. Talk to your healthcare provider if you have questions or concerns about how LAGEVRIO may affect sperm. You are being given this fact sheet because your healthcare provider believes it is necessary to provide you with LAGEVRIO for the treatment of adults with mild-to-moderate coronavirus disease 2019 (COVID-19) with positive results of direct SARS-CoV-2 viral testing, and who are at high risk for progression to severe COVID-19 including hospitalization or death, and for whom other COVID-19 treatment options  approved or authorized by the FDA are not accessible or clinically appropriate. The U.S. Food and Drug Administration (FDA) has issued an Emergency Use Authorization (EUA) to make LAGEVRIO available during the COVID-19 pandemic (for more details about an EUA please see "What is an Emergency Use Authorization?" at the end of this document). LAGEVRIO is not an FDA-approved medicine in the [place]. Read this Fact Sheet for information about LAGEVRIO. Talk to your healthcare provider about your options if you have any questions. It is your choice to take LAGEVRIO.  What is COVID-19? COVID-19 is caused by a virus called a coronavirus. You can get COVID-19 through close contact with another person who has the virus. COVID-19 illnesses have ranged from very mild-to-severe, including illness resulting in death. While information so far suggests that most COVID-19 illness is mild, serious illness can happen and may cause some of your other medical conditions to become worse. Older people and people of all ages with severe, long lasting (chronic) medical conditions like heart disease, lung disease and diabetes, for example seem to be at higher risk of being hospitalized for COVID-19.  What is LAGEVRIO? LAGEVRIO is an investigational medicine used to treat mild-to-moderate COVID-19 in  adults: ? with positive results of direct SARS-CoV-2 viral testing, and ? who are at high risk for progression to severe COVID-19 including hospitalization or death, and for whom other COVID-19 treatment options approved or authorized by the FDA are not accessible or clinically appropriate. The FDA has authorized the emergency use of LAGEVRIO for the treatment of mild-tomoderate COVID-19 in adults under an EUA. For more information on EUA, see the "What is an Emergency Use Authorization (EUA)?" section at the end of this Fact Sheet. LAGEVRIO is not authorized: ? for use in people less than 10 years of age. ? for  prevention of COVID-19. ? for people needing hospitalization for COVID-19. ? for use for longer than 5 consecutive days.  What should I tell my healthcare provider before I take LAGEVRIO? Tell your healthcare provider if you: ? Have any allergies ? Are breastfeeding or plan to breastfeed ? Have any serious illnesses ? Are taking any medicines (prescription, over-the-counter, vitamins, or herbal products).  How do I take LAGEVRIO? ? Take LAGEVRIO exactly as your healthcare provider tells you to take it. ? Take 4 capsules of LAGEVRIO every 12 hours (for example, at 8 am and at 8 pm) ? Take LAGEVRIO for 5 days. It is important that you complete the full 5 days of treatment with LAGEVRIO. Do not stop taking LAGEVRIO before you complete the full 5 days of treatment, even if you feel better. ? Take LAGEVRIO with or without food. ? You should stay in isolation for as long as your healthcare provider tells you to. Talk to your healthcare provider if you are not sure about how to properly isolate while you have COVID-19. ? Swallow LAGEVRIO capsules whole. Do not open, break, or crush the capsules. If you cannot swallow capsules whole, tell your healthcare provider. ? What to do if you miss a dose: o If it has been less than 10 hours since the missed dose, take it as soon as you remember o If it has been more than 10 hours since the missed dose, skip the missed dose and take your dose at the next scheduled time. ? Do not double the dose of LAGEVRIO to make up for a missed dose.  What are the important possible side effects of LAGEVRIO? ? See, "What is the most important information I should know about LAGEVRIO?" ? Allergic Reactions. Allergic reactions can happen in people taking LAGEVRIO, even after only 1 dose. Stop taking LAGEVRIO and call your healthcare provider right away if you get any of the following symptoms of an allergic reaction: o hives o rapid heartbeat o trouble swallowing  or breathing o swelling of the mouth, lips, or face o throat tightness o hoarseness o skin rash The most common side effects of LAGEVRIO are: ? diarrhea ? nausea ? dizziness These are not all the possible side effects of LAGEVRIO. Not many people have taken LAGEVRIO. Serious and unexpected side effects may happen. This medicine is still being studied, so it is possible that all of the risks are not known at this time.  What other treatment choices are there?  Veklury (remdesivir) is FDA-approved as an intravenous (IV) infusion for the treatment of mildto-moderate ZYYQM-25 in certain adults and children. Talk with your doctor to see if Marijean Heath is appropriate for you. Like LAGEVRIO, FDA may also allow for the emergency use of other medicines to treat people with COVID-19. Go to LacrosseProperties.si for more information. It is your choice to be treated or not to  be treated with LAGEVRIO. Should you decide not to take it, it will not change your standard medical care.  What if I am breastfeeding? Breastfeeding is not recommended during treatment with LAGEVRIO and for 4 days after the last dose of LAGEVRIO. If you are breastfeeding or plan to breastfeed, talk to your healthcare provider about your options and specific situation before taking LAGEVRIO.  How do I report side effects with LAGEVRIO? Contact your healthcare provider if you have any side effects that bother you or do not go away. Report side effects to FDA MedWatch at SmoothHits.hu or call 1-800-FDA-1088 (1- 443-589-9356).  How should I store [place]? ? Store LAGEVRIO capsules at room temperature between 54F to 61F (20C to 25C). ? Keep LAGEVRIO and all medicines out of the reach of children and pets. How can I learn more about COVID-19? ? Ask your healthcare provider. ? Visit SeekRooms.co.uk ? Contact your  local or state public health department. ? Call [place] at [phone] (toll free in the [place] ? Visit www.molnupiravir.com  What Is an Emergency Use Authorization (EUA)? The [place] FDA has made [place] available under an emergency access mechanism called an Emergency Use Authorization (EUA) The EUA is supported by a Presenter, broadcasting Health and Human Service (HHS) declaration that circumstances exist to justify emergency use of drugs and biological products during the COVID-19 pandemic. LAGEVRIO for the treatment of mild-to-moderate COVID-19 in adults with positive results of direct SARS-CoV-2 viral testing, who are at high risk for progression to severe COVID-19, including hospitalization or death, and for whom alternative COVID-19 treatment options approved or authorized by FDA are not accessible or clinically appropriate, has not undergone the same type of review as an FDA-approved product. In issuing an EUA under the HUDJS-97 public health emergency, the FDA has determined, among other things, that based on the total amount of scientific evidence available including data from adequate and well-controlled clinical trials, if available, it is reasonable to believe that the product may be effective for diagnosing, treating, or preventing COVID-19, or a serious or life-threatening disease or condition caused by COVID-19; that the known and potential benefits of the product, when used to diagnose, treat, or prevent such disease or condition, outweigh the known and potential risks of such product; and that there are no adequate, approved, and available alternatives.  All of these criteria must be met to allow for the product to be used in the treatment of patients during the COVID-19 pandemic. The EUA for LAGEVRIO is in effect for the duration of the COVID-19 declaration justifying emergency use of LAGEVRIO, unless terminated or revoked (after which LAGEVRIO may no longer be  used under the EUA). For patent information: http://rogers.info/ Copyright  2021-2022 [place], [place] and its affiliates. All rights reserved. [id number] Revised: March 2022

## 2020-11-10 NOTE — Progress Notes (Signed)
Subjective:   Briana Giles is a 75 y.o. female who presents for Medicare Annual (Subsequent) preventive examination. Patient unable to connect to my chart  I connected with Briana Giles today by telephone and verified that I am speaking with the correct person using two identifiers. Location patient: home Location provider: work Persons participating in the virtual visit: patient, provider.   I discussed the limitations, risks, security and privacy concerns of performing an evaluation and management service by telephone and the availability of in person appointments. I also discussed with the patient that there may be a patient responsible charge related to this service. The patient expressed understanding and verbally consented to this telephonic visit.    Interactive audio and video telecommunications were attempted between this provider and patient, however failed, due to patient having technical difficulties OR patient did not have access to video capability.  We continued and completed visit with audio only.    Review of Systems    N/a       Objective:    There were no vitals filed for this visit. There is no height or weight on file to calculate BMI.  Advanced Directives 10/09/2019 05/02/2017 08/28/2014  Does Patient Have a Medical Advance Directive? Yes Yes Yes  Type of Advance Directive Briana Giles will  Briana Giles in Chart? No - copy requested - Yes    Current Medications (verified) Outpatient Encounter Medications as of 11/10/2020  Medication Sig   acetaminophen (TYLENOL) 500 MG tablet Take 1,000 mg by mouth every 6 (six) hours as needed for mild pain, moderate pain or headache.   benzonatate (TESSALON PERLES) 100 MG capsule Take 1 capsule (100 mg total) by mouth 3 (three) times daily as needed.   fluticasone (FLONASE) 50 MCG/ACT nasal spray USE TWO SPRAYS IN EACH NOSTRIL ONCE DAILY. (Patient taking differently: as  needed.)   lisinopril (ZESTRIL) 20 MG tablet TAKE 1 TABLET BY MOUTH EVERY DAY   molnupiravir EUA (LAGEVRIO) 200 mg CAPS capsule Take 4 capsules (800 mg total) by mouth 2 (two) times daily for 5 days.   polyethylene glycol powder (MIRALAX) powder Take 17 g by mouth once. (Patient taking differently: Take 17 g by mouth as needed.)   tiZANidine (ZANAFLEX) 4 MG tablet Take 1 tablet (4 mg total) by mouth at bedtime.   traMADol (ULTRAM) 50 MG tablet Take 1 tablet (50 mg total) by mouth daily as needed.   traZODone (DESYREL) 50 MG tablet TAKE 1 TABLET(50 MG) BY MOUTH AT BEDTIME AS NEEDED FOR SLEEP. MAY INCREASE TO 1 AND 1/2 TABLETS 75 MG DOSE   No facility-administered encounter medications on file as of 11/10/2020.    Allergies (verified) Codeine phosphate   History: Past Medical History:  Diagnosis Date   Abdominal pain, other specified site    Abnormal LFTs    Allergic rhinitis    Cervical back pain with evidence of disc disease    GERD (gastroesophageal reflux disease)    Heart murmur    says all her life had echo in [place] year ago not to worry   Hypertension    Nerve damage    left arm   Osteoarthritis    Osteopenia    Phlebitis    Spinal stenosis    Transient global amnesia    Past Surgical History:  Procedure Laterality Date   BUNIONECTOMY     fall 2022 [facility]   CHOLECYSTECTOMY     CYST EXCISION Right  01/2018   right thumb    FOOT SURGERY      x 2     HAND SURGERY     right hand   KNEE ARTHROSCOPY     right   REPLACEMENT UNICONDYLAR JOINT KNEE     rt knee   TONSILLECTOMY     TOTAL KNEE ARTHROPLASTY Left 06/2019   Family History  Problem Relation Age of Onset   Hypertension Father    Alcohol abuse Father    Lung cancer Father    Cancer Maternal Grandmother        sinus   COPD Mother    Osteoporosis Mother    Heart disease Mother    Hypertension Brother    Heart Problems Brother    Arthritis Brother    Prostate cancer Maternal Grandfather    Heart  attack Brother    Hypertension Brother    Healthy Daughter    Healthy Son    Colon cancer Neg Hx    Pancreatic cancer Neg Hx    Stomach cancer Neg Hx    Social History   Socioeconomic History   Marital status: Married    Spouse name: Not on file   Number of children: 2   Years of education: Not on file   Highest education level: Not on file  Occupational History   Occupation: retired Product manager: RETIRED  Tobacco Use   Smoking status: Never   Smokeless tobacco: Never  Vaping Use   Vaping Use: Never used  Substance and Sexual Activity   Alcohol use: Yes    Comment: occasional    Drug use: Never   Sexual activity: Not on file  Other Topics Concern   Not on file  Social History Narrative   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  Bright grand child    Does water exercise aerobics walking elliptical    Social Determinants of Health   Financial Resource Strain: Not on file  Food Insecurity: Not on file  Transportation Needs: Not on file  Physical Activity: Not on file  Stress: Not on file  Social Connections: Not on file    Tobacco Counseling Counseling given: Not Answered   Clinical Intake:                 Diabetic?no         Activities of Daily Living No flowsheet data found.  Patient Care Team: Panosh, Standley Brooking, MD as PCP - General [facility], Tamala Fothergill, DPM (Podiatry) Bo Merino, MD (Rheumatology) Milus Banister, MD (Gastroenterology) Jari Pigg, MD as Attending Physician (Dermatology) Laurance Flatten, MD as Referring Physician (Orthopedic Surgery)  Indicate any recent Medical Services you may have received from other than [facility] providers in the past year (date may be approximate).     Assessment:   This is a routine wellness examination for [facility].  Hearing/Vision screen No results found.  Dietary issues and exercise activities discussed:     Goals  Addressed   None    Depression Screen PHQ 2/9 Scores 10/09/2019 12/05/2018 05/02/2017 03/30/2016 03/18/2015 01/06/2014 12/31/2012  PHQ - 2 Score 0 0 0 0 0 0 0    Fall Risk Fall Risk  10/09/2019 01/02/2019 05/02/2017 03/18/2015 03/18/2015  Falls in the past year? 0 0 No (No Data) Yes  Comment - - - fell  when hiking  down in [place]  -  Number falls in past yr: 0 0 - - -  Injury with Fall? 0 0 - No -  Risk for fall due to : Impaired mobility;Impaired vision - - - -  Risk for fall due to: Comment related to knee pain at times - - - -  Follow up Falls prevention discussed Falls evaluation completed - - -    FALL RISK PREVENTION PERTAINING TO THE HOME:  Any stairs in or around the home? Yes  If so, are there any without handrails? No  Home free of loose throw rugs in walkways, pet beds, electrical cords, etc? Yes  Adequate lighting in your home to reduce risk of falls? Yes   ASSISTIVE DEVICES UTILIZED TO PREVENT FALLS:  Life alert? No  Use of a cane, walker or w/c? No  Grab bars in the bathroom? Yes  Shower chair or bench in shower? No  Elevated toilet seat or a handicapped toilet? No    Cognitive Function:  Normal cognitive status assessed by direct observation by this Nurse Health Advisor. No abnormalities found.     6CIT Screen 10/09/2019  What Year? 0 points  What month? 0 points  Count back from 20 0 points  Months in reverse 0 points  Repeat phrase 0 points    Immunizations Immunization History  Administered Date(s) Administered   Influenza Split 12/27/2011, 10/15/2012   Influenza Whole 12/03/2007, 12/24/2009, 10/25/2010   Influenza, High Dose Seasonal PF 12/31/2013, 11/06/2014, 12/10/2016, 10/25/2017, 10/31/2018   Influenza,inj,quad, With Preservative 11/10/2017   Influenza-Unspecified 11/14/2016   PFIZER(Purple Top)SARS-COV-2 Vaccination 03/08/2019, 03/30/2019, 10/30/2019   Pfizer Covid-19 Vaccine Bivalent Booster 29yrs & up 11/03/2020   Pneumococcal Conjugate-13  01/06/2014   Pneumococcal Polysaccharide-23 12/21/2010   Td 05/27/2008   Tdap 01/06/2014   Zoster Recombinat (Shingrix) 10/26/2017, 10/31/2018   Zoster, Live 12/03/2007    TDAP status: Up to date  Flu Vaccine status: Up to date  Pneumococcal vaccine status: Up to date  Covid-19 vaccine status: Completed vaccines  Qualifies for Shingles Vaccine? Yes   Zostavax completed Yes   Shingrix Completed?: Yes  Screening Tests Health Maintenance  Topic Date Due   COVID-19 Vaccine (4 - Booster for Pfizer series) 01/22/2020   INFLUENZA VACCINE  09/14/2020   TETANUS/TDAP  01/07/2024   DEXA SCAN  Completed   Hepatitis C Screening  Completed   Zoster Vaccines- Shingrix  Completed   HPV VACCINES  Aged Out    Health Maintenance  Health Maintenance Due  Topic Date Due   COVID-19 Vaccine (4 - Booster for Pfizer series) 01/22/2020   INFLUENZA VACCINE  09/14/2020    Colorectal cancer screening: No longer required.   Mammogram status: No longer required due to age.  Bone Density status: Ordered 11/10/2020. Pt provided with contact info and advised to call to schedule appt.  Lung Cancer Screening: (Low Dose CT Chest recommended if Age 54-80 years, 30 pack-year currently smoking OR have quit w/in 15years.) does not qualify.   Lung Cancer Screening Referral: n/a  Additional Screening:  Hepatitis C Screening: does not qualify; Completed 03/18/2015  Vision Screening: Recommended annual ophthalmology exams for early detection of glaucoma and other disorders of the eye. Is the patient up to date with their annual eye exam?  No  Who is the provider or what is the name of the office in which the patient attends annual eye exams? Referral 11/10/2020 If pt is not established with a provider, would they like to be referred to a provider to establish care?  Yes .   Dental Screening: Recommended annual dental exams for proper oral hygiene  Community Resource Referral / Chronic Care  Management: CRR required this visit?  Yes   CCM required this visit?  Yes      Plan:     I have personally reviewed and noted the following in the patient's chart:   Medical and social history Use of alcohol, tobacco or illicit drugs  Current medications and supplements including opioid prescriptions.  Functional ability and status Nutritional status Physical activity Advanced directives List of other physicians Hospitalizations, surgeries, and ER visits in previous 12 months Vitals Screenings to include cognitive, depression, and falls Referrals and appointments  In addition, I have reviewed and discussed with patient certain preventive protocols, quality metrics, and best practice recommendations. A written personalized care plan for preventive services as well as general preventive health recommendations were provided to patient.     Randel Pigg, LPN   [id number]   Nurse Notes: none

## 2020-11-10 NOTE — Progress Notes (Signed)
Virtual Visit via Video Note  I connected with Olean Ree  on 11/10/20 at  1:00 PM EDT by a video enabled telemedicine application and verified that I am speaking with the correct person using two identifiers.  Location patient: home, [place] Location provider:work or home office Persons participating in the virtual visit: patient, provider  I discussed the limitations of evaluation and management by telemedicine and the availability of in person appointments. The patient expressed understanding and agreed to proceed.   HPI:  Acute telemedicine visit for Covid19: -Onset: 2 days ago, covid home test positive -Symptoms include:cough, body aches, HA, nasal sinus congestion -Denies:CP, SOB, NVD, fever, inability to eat/drink/get out of bed -Has tried:dayquil  -Pertinent past medical history: see below -Pertinent medication allergies: Allergies  Allergen Reactions   Codeine Phosphate Other (See Comments)    REACTION: upset stomach  -COVID-19 vaccine status: 2 doses and 3 boosters -no recent labs  ROS: See pertinent positives and negatives per HPI.  Past Medical History:  Diagnosis Date   Abdominal pain, other specified site    Abnormal LFTs    Allergic rhinitis    Cervical back pain with evidence of disc disease    GERD (gastroesophageal reflux disease)    Heart murmur    says all her life had echo in [place] year ago not to worry   Hypertension    Nerve damage    left arm   Osteoarthritis    Osteopenia    Phlebitis    Spinal stenosis    Transient global amnesia     Past Surgical History:  Procedure Laterality Date   BUNIONECTOMY     fall 2022 [facility]   CHOLECYSTECTOMY     CYST EXCISION Right 01/2018   right thumb    FOOT SURGERY      x 2     HAND SURGERY     right hand   KNEE ARTHROSCOPY     right   REPLACEMENT UNICONDYLAR JOINT KNEE     rt knee   TONSILLECTOMY     TOTAL KNEE ARTHROPLASTY Left 06/2019     Current Outpatient Medications:    acetaminophen  (TYLENOL) 500 MG tablet, Take 1,000 mg by mouth every 6 (six) hours as needed for mild pain, moderate pain or headache., Disp: , Rfl:    benzonatate (TESSALON PERLES) 100 MG capsule, Take 1 capsule (100 mg total) by mouth 3 (three) times daily as needed., Disp: 20 capsule, Rfl: 0   fluticasone (FLONASE) 50 MCG/ACT nasal spray, USE TWO SPRAYS IN EACH NOSTRIL ONCE DAILY. (Patient taking differently: as needed.), Disp: 16 g, Rfl: 5   lisinopril (ZESTRIL) 20 MG tablet, TAKE 1 TABLET BY MOUTH EVERY DAY, Disp: 90 tablet, Rfl: 2   molnupiravir EUA (LAGEVRIO) 200 mg CAPS capsule, Take 4 capsules (800 mg total) by mouth 2 (two) times daily for 5 days., Disp: 40 capsule, Rfl: 0   polyethylene glycol powder (MIRALAX) powder, Take 17 g by mouth once. (Patient taking differently: Take 17 g by mouth as needed.), Disp: 255 g, Rfl: 0   tiZANidine (ZANAFLEX) 4 MG tablet, Take 1 tablet (4 mg total) by mouth at bedtime., Disp: 30 tablet, Rfl: 0   traMADol (ULTRAM) 50 MG tablet, Take 1 tablet (50 mg total) by mouth daily as needed., Disp: 30 tablet, Rfl: 0   traZODone (DESYREL) 50 MG tablet, TAKE 1 TABLET(50 MG) BY MOUTH AT BEDTIME AS NEEDED FOR SLEEP. MAY INCREASE TO 1 AND 1/2 TABLETS 75 MG DOSE, Disp:  30 tablet, Rfl: 1  EXAM:  VITALS per patient if applicable:  GENERAL: alert, oriented, appears well and in no acute distress  HEENT: atraumatic, conjunttiva clear, no obvious abnormalities on inspection of external nose and ears  NECK: normal movements of the head and neck  LUNGS: on inspection no signs of respiratory distress, breathing rate appears normal, no obvious gross SOB, gasping or wheezing  CV: no obvious cyanosis  MS: moves all visible extremities without noticeable abnormality  PSYCH/NEURO: pleasant and cooperative, no obvious depression or anxiety, speech and thought processing grossly intact  ASSESSMENT AND PLAN:  Discussed the following assessment and plan:  COVID-19   Discussed treatment  options (infusions and oral options and risk of drug interactions), ideal treatment window, potential complications, isolation and precautions for COVID-19.  Discussed possibility of rebound with antivirals and the need to reisolate if it should occur for 5 days. Checked for/reviewed any labs done in the last 90 days with GFR listed in HPI if available. After lengthy discussion, the patient opted for treatment with molnupiracir due to being higher risk for complications of covid or severe disease and other factors. Discussed EUA status of this drug and the fact that there is preliminary limited knowledge of risks/interactions/side effects per EUA document vs possible benefits and precautions. This information was shared with patient during the visit and also was provided in patient instructions. Also, advised that patient discuss risks/interactions and use with pharmacist/treatment team as well.  The patient did want a prescription for cough, Tessalon Rx sent.  Other symptomatic care measures summarized in patient instructions. Advised to seek prompt in person care if worsening, new symptoms arise, or if is not improving with treatment. Discussed options for inperson care if PCP office not available. Did let this patient know that I only do telemedicine on Tuesdays and Thursdays for [place]. Advised to schedule follow up visit with PCP or [facility] if any further questions or concerns to avoid delays in care.   I discussed the assessment and treatment plan with the patient. The patient was provided an opportunity to ask questions and all were answered. The patient agreed with the plan and demonstrated an understanding of the instructions.     [name], DO

## 2020-11-10 NOTE — Patient Instructions (Signed)
Briana Giles , Thank you for taking time to come for your Medicare Wellness Visit. I appreciate your ongoing commitment to your health goals. Please review the following plan we discussed and let me know if I can assist you in the future.   Screening recommendations/referrals: Colonoscopy: no longer required  Mammogram: no longer required  Bone Density: referral 11/10/2020 Recommended yearly ophthalmology/optometry visit for glaucoma screening and checkup Recommended yearly dental visit for hygiene and checkup  Vaccinations: Influenza vaccine: completed  Pneumococcal vaccine: completed series  Tdap vaccine: 01/06/2014 Shingles vaccine: completed series     Advanced directives: will provide copies   Conditions/risks identified: none   Next appointment: CPE  12/21/2020  130pm  Dr.Panosh    Preventive Care 25 Years and Older, Female Preventive care refers to lifestyle choices and visits with your health care provider that can promote health and wellness. What does preventive care include? A yearly physical exam. This is also called an annual well check. Dental exams once or twice a year. Routine eye exams. Ask your health care provider how often you should have your eyes checked. Personal lifestyle choices, including: Daily care of your teeth and gums. Regular physical activity. Eating a healthy diet. Avoiding tobacco and drug use. Limiting alcohol use. Practicing safe sex. Taking low-dose aspirin every day. Taking vitamin and mineral supplements as recommended by your health care provider. What happens during an annual well check? The services and screenings done by your health care provider during your annual well check will depend on your age, overall health, lifestyle risk factors, and family history of disease. Counseling  Your health care provider may ask you questions about your: Alcohol use. Tobacco use. Drug use. Emotional well-being. Home and relationship  well-being. Sexual activity. Eating habits. History of falls. Memory and ability to understand (cognition). Work and work Statistician. Reproductive health. Screening  You may have the following tests or measurements: Height, weight, and BMI. Blood pressure. Lipid and cholesterol levels. These may be checked every 5 years, or more frequently if you are over 15 years old. Skin check. Lung cancer screening. You may have this screening every year starting at age 20 if you have a 30-pack-year history of smoking and currently smoke or have quit within the past 15 years. Fecal occult blood test (FOBT) of the stool. You may have this test every year starting at age 5. Flexible sigmoidoscopy or colonoscopy. You may have a sigmoidoscopy every 5 years or a colonoscopy every 10 years starting at age 53. Hepatitis C blood test. Hepatitis B blood test. Sexually transmitted disease (STD) testing. Diabetes screening. This is done by checking your blood sugar (glucose) after you have not eaten for a while (fasting). You may have this done every 1-3 years. Bone density scan. This is done to screen for osteoporosis. You may have this done starting at age 55. Mammogram. This may be done every 1-2 years. Talk to your health care provider about how often you should have regular mammograms. Talk with your health care provider about your test results, treatment options, and if necessary, the need for more tests. Vaccines  Your health care provider may recommend certain vaccines, such as: Influenza vaccine. This is recommended every year. Tetanus, diphtheria, and acellular pertussis (Tdap, Td) vaccine. You may need a Td booster every 10 years. Zoster vaccine. You may need this after age 31. Pneumococcal 13-valent conjugate (PCV13) vaccine. One dose is recommended after age 72. Pneumococcal polysaccharide (PPSV23) vaccine. One dose is recommended after age 8.  Talk to your health care provider about which  screenings and vaccines you need and how often you need them. This information is not intended to replace advice given to you by your health care provider. Make sure you discuss any questions you have with your health care provider. Document Released: 02/27/2015 Document Revised: 10/21/2015 Document Reviewed: 12/02/2014 Elsevier Interactive Patient Education  2017 [employer] Prevention in the Home Falls can cause injuries. They can happen to people of all ages. There are many things you can do to make your home safe and to help prevent falls. What can I do on the outside of my home? Regularly fix the edges of walkways and driveways and fix any cracks. Remove anything that might make you trip as you walk through a door, such as a raised step or threshold. Trim any bushes or trees on the path to your home. Use bright outdoor lighting. Clear any walking paths of anything that might make someone trip, such as rocks or tools. Regularly check to see if handrails are loose or broken. Make sure that both sides of any steps have handrails. Any raised decks and porches should have guardrails on the edges. Have any leaves, snow, or ice cleared regularly. Use sand or salt on walking paths during winter. Clean up any spills in your garage right away. This includes oil or grease spills. What can I do in the bathroom? Use night lights. Install grab bars by the toilet and in the tub and shower. Do not use towel bars as grab bars. Use non-skid mats or decals in the tub or shower. If you need to sit down in the shower, use a plastic, non-slip stool. Keep the floor dry. Clean up any water that spills on the floor as soon as it happens. Remove soap buildup in the tub or shower regularly. Attach bath mats securely with double-sided non-slip rug tape. Do not have throw rugs and other things on the floor that can make you trip. What can I do in the bedroom? Use night lights. Make sure that you have a  light by your bed that is easy to reach. Do not use any sheets or blankets that are too big for your bed. They should not hang down onto the floor. Have a firm chair that has side arms. You can use this for support while you get dressed. Do not have throw rugs and other things on the floor that can make you trip. What can I do in the kitchen? Clean up any spills right away. Avoid walking on wet floors. Keep items that you use a lot in easy-to-reach places. If you need to reach something above you, use a strong step stool that has a grab bar. Keep electrical cords out of the way. Do not use floor polish or wax that makes floors slippery. If you must use wax, use non-skid floor wax. Do not have throw rugs and other things on the floor that can make you trip. What can I do with my stairs? Do not leave any items on the stairs. Make sure that there are handrails on both sides of the stairs and use them. Fix handrails that are broken or loose. Make sure that handrails are as long as the stairways. Check any carpeting to make sure that it is firmly attached to the stairs. Fix any carpet that is loose or worn. Avoid having throw rugs at the top or bottom of the stairs. If you do have throw  rugs, attach them to the floor with carpet tape. Make sure that you have a light switch at the top of the stairs and the bottom of the stairs. If you do not have them, ask someone to add them for you. What else can I do to help prevent falls? Wear shoes that: Do not have high heels. Have rubber bottoms. Are comfortable and fit you well. Are closed at the toe. Do not wear sandals. If you use a stepladder: Make sure that it is fully opened. Do not climb a closed stepladder. Make sure that both sides of the stepladder are locked into place. Ask someone to hold it for you, if possible. Clearly mark and make sure that you can see: Any grab bars or handrails. First and last steps. Where the edge of each step  is. Use tools that help you move around (mobility aids) if they are needed. These include: Canes. Walkers. Scooters. Crutches. Turn on the lights when you go into a dark area. Replace any light bulbs as soon as they burn out. Set up your furniture so you have a clear path. Avoid moving your furniture around. If any of your floors are uneven, fix them. If there are any pets around you, be aware of where they are. Review your medicines with your doctor. Some medicines can make you feel dizzy. This can increase your chance of falling. Ask your doctor what other things that you can do to help prevent falls. This information is not intended to replace advice given to you by your health care provider. Make sure you discuss any questions you have with your health care provider. Document Released: 11/27/2008 Document Revised: 07/09/2015 Document Reviewed: 03/07/2014 Elsevier Interactive Patient Education  2017 [employer].

## 2020-11-17 ENCOUNTER — Encounter: Payer: Self-pay | Admitting: Family Medicine

## 2020-11-17 ENCOUNTER — Telehealth (INDEPENDENT_AMBULATORY_CARE_PROVIDER_SITE_OTHER): Payer: Medicare Other | Admitting: Family Medicine

## 2020-11-17 DIAGNOSIS — U071 COVID-19: Secondary | ICD-10-CM

## 2020-11-17 DIAGNOSIS — R059 Cough, unspecified: Secondary | ICD-10-CM | POA: Diagnosis not present

## 2020-11-17 MED ORDER — BENZONATATE 200 MG PO CAPS: 200.0000 mg | ORAL_CAPSULE | Freq: Two times a day (BID) | ORAL | 0 refills | Status: DC | PRN

## 2020-11-17 NOTE — Progress Notes (Signed)
Virtual Visit via Video Note  I connected with Briana Giles  on 11/17/20 at 10:00 AM EDT by a video enabled telemedicine application and verified that I am speaking with the correct person using two identifiers.  Location patient: home, [place] Location provider:work or home office Persons participating in the virtual visit: patient, provider  I discussed the limitations of evaluation and management by telemedicine and the availability of in person appointments. The patient expressed understanding and agreed to proceed.   HPI:  Acute telemedicine visit for Covid19: -Onset: 11/08/20 -Symptoms include: overall doing better, but has a continued cough, tested negative for covid yesterday on home test -Denies:fever, CP, SOB, inability to eat/drink/get out of bed -Has tried:cough drops -Pertinent past medical history:see below -Pertinent medication allergies: Allergies  Allergen Reactions   Codeine Phosphate Other (See Comments)    REACTION: upset stomach  -COVID-19 vaccine status:2 doses and 3 boosters  ROS: See pertinent positives and negatives per HPI.  Past Medical History:  Diagnosis Date   Abdominal pain, other specified site    Abnormal LFTs    Allergic rhinitis    Cervical back pain with evidence of disc disease    GERD (gastroesophageal reflux disease)    Heart murmur    says all her life had echo in [place] year ago not to worry   Hypertension    Nerve damage    left arm   Osteoarthritis    Osteopenia    Phlebitis    Spinal stenosis    Transient global amnesia     Past Surgical History:  Procedure Laterality Date   BUNIONECTOMY     fall 2022 [facility]   CHOLECYSTECTOMY     CYST EXCISION Right 01/2018   right thumb    FOOT SURGERY      x 2     HAND SURGERY     right hand   KNEE ARTHROSCOPY     right   REPLACEMENT UNICONDYLAR JOINT KNEE     rt knee   TONSILLECTOMY     TOTAL KNEE ARTHROPLASTY Left 06/2019     Current Outpatient Medications:    acetaminophen  (TYLENOL) 500 MG tablet, Take 1,000 mg by mouth every 6 (six) hours as needed for mild pain, moderate pain or headache., Disp: , Rfl:    benzonatate (TESSALON) 200 MG capsule, Take 1 capsule (200 mg total) by mouth 2 (two) times daily as needed for cough., Disp: 20 capsule, Rfl: 0   fluticasone (FLONASE) 50 MCG/ACT nasal spray, USE TWO SPRAYS IN EACH NOSTRIL ONCE DAILY. (Patient taking differently: as needed.), Disp: 16 g, Rfl: 5   lisinopril (ZESTRIL) 20 MG tablet, TAKE 1 TABLET BY MOUTH EVERY DAY, Disp: 90 tablet, Rfl: 2   polyethylene glycol powder (MIRALAX) powder, Take 17 g by mouth once. (Patient taking differently: Take 17 g by mouth as needed.), Disp: 255 g, Rfl: 0   tiZANidine (ZANAFLEX) 4 MG tablet, Take 1 tablet (4 mg total) by mouth at bedtime., Disp: 30 tablet, Rfl: 0   traMADol (ULTRAM) 50 MG tablet, Take 1 tablet (50 mg total) by mouth daily as needed., Disp: 30 tablet, Rfl: 0   traZODone (DESYREL) 50 MG tablet, TAKE 1 TABLET(50 MG) BY MOUTH AT BEDTIME AS NEEDED FOR SLEEP. MAY INCREASE TO 1 AND 1/2 TABLETS 75 MG DOSE, Disp: 30 tablet, Rfl: 1  EXAM:  VITALS per patient if applicable:  GENERAL: alert, oriented, appears well and in no acute distress  HEENT: atraumatic, conjunttiva clear, no obvious abnormalities on  inspection of external nose and ears  NECK: normal movements of the head and neck  LUNGS: on inspection no signs of respiratory distress, breathing rate appears normal, no obvious gross SOB, gasping or wheezing  CV: no obvious cyanosis  MS: moves all visible extremities without noticeable abnormality  PSYCH/NEURO: pleasant and cooperative, no obvious depression or anxiety, speech and thought processing grossly intact  ASSESSMENT AND PLAN:  Discussed the following assessment and plan:  COVID-19  Cough, unspecified type  -we discussed possible serious and likely etiologies, options for evaluation and workup, limitations of telemedicine visit vs in person visit,  treatment, treatment risks and precautions. Pt is agreeable to treatment via telemedicine at this moment. Still in normal timeframe for symptoms with covid. Seems to be improving so likely post infectious cough. Opted to do a higher dose of tessalon rx for cough after discussion of options per her preference.   Advised to seek prompt in person care if worsening, new symptoms arise, or if is not improving with treatment. Discussed options for inperson care if PCP office not available. Did let this patient know that I only do telemedicine on Tuesdays and Thursdays for [place]. Advised to schedule follow up visit with PCP or [facility] if any further questions or concerns to avoid delays in care.   I discussed the assessment and treatment plan with the patient. The patient was provided an opportunity to ask questions and all were answered. The patient agreed with the plan and demonstrated an understanding of the instructions.     [name], DO

## 2020-11-17 NOTE — Patient Instructions (Signed)
-  I sent the medication(s) we discussed to your pharmacy: Meds ordered this encounter  Medications   benzonatate (TESSALON) 200 MG capsule    Sig: Take 1 capsule (200 mg total) by mouth 2 (two) times daily as needed for cough.    Dispense:  20 capsule    Refill:  0     I hope you are feeling all better soon!  Seek in person care promptly if your symptoms worsen, new concerns arise or you are not improving with treatment.  It was nice to meet you today. I help [place] out with telemedicine visits on Tuesdays and Thursdays and am available for visits on those days. If you have any concerns or questions following this visit please schedule a follow up visit with your Primary Care doctor or seek care at a local urgent care clinic to avoid delays in care.

## 2020-12-03 ENCOUNTER — Other Ambulatory Visit: Payer: Self-pay | Admitting: Internal Medicine

## 2020-12-14 ENCOUNTER — Other Ambulatory Visit: Payer: Self-pay

## 2020-12-14 MED ORDER — TRAMADOL HCL 50 MG PO TABS: 50.0000 mg | ORAL_TABLET | Freq: Every day | ORAL | 0 refills | Status: DC | PRN

## 2020-12-14 NOTE — Telephone Encounter (Signed)
Next Visit: 03/16/2020   Last Visit: 09/10/2020   UDS: 09/10/2020   Narc Agreement: 09/10/2020   Last Fill: 10/20/2020  Okay to refill Tramadol?

## 2020-12-14 NOTE — Telephone Encounter (Signed)
Patient left a voicemail requesting prescription refill of Tramadol.  Patient states she is going out of town on Tuesday, 12/15/20 and would like to pick up the prescription before she leaves.

## 2020-12-21 ENCOUNTER — Encounter: Payer: Medicare Other | Admitting: Internal Medicine

## 2021-01-14 ENCOUNTER — Other Ambulatory Visit: Payer: Self-pay

## 2021-01-14 MED ORDER — TRAMADOL HCL 50 MG PO TABS: 50.0000 mg | ORAL_TABLET | Freq: Every day | ORAL | 0 refills | Status: DC | PRN

## 2021-01-14 NOTE — Telephone Encounter (Signed)
Next Visit: 03/16/2020   Last Visit: 09/10/2020   UDS: 09/10/2020   Narc Agreement: 09/10/2020   Last Fill: 12/14/2020    Okay to refill Tramadol?

## 2021-01-14 NOTE — Telephone Encounter (Signed)
Patient called requesting prescription refill of Tramadol to be sent to [facility] at [place].

## 2021-01-24 ENCOUNTER — Other Ambulatory Visit: Payer: Self-pay | Admitting: Internal Medicine

## 2021-02-01 ENCOUNTER — Encounter: Payer: Self-pay | Admitting: Internal Medicine

## 2021-02-01 ENCOUNTER — Ambulatory Visit (INDEPENDENT_AMBULATORY_CARE_PROVIDER_SITE_OTHER): Payer: Medicare Other | Admitting: Internal Medicine

## 2021-02-01 ENCOUNTER — Other Ambulatory Visit: Payer: Self-pay

## 2021-02-01 ENCOUNTER — Ambulatory Visit (INDEPENDENT_AMBULATORY_CARE_PROVIDER_SITE_OTHER): Payer: Medicare Other

## 2021-02-01 VITALS — BP 124/82 | HR 84 | Temp 98.0°F | Ht 61.0 in | Wt 133.6 lb

## 2021-02-01 DIAGNOSIS — R413 Other amnesia: Secondary | ICD-10-CM

## 2021-02-01 DIAGNOSIS — M542 Cervicalgia: Secondary | ICD-10-CM

## 2021-02-01 DIAGNOSIS — Z79899 Other long term (current) drug therapy: Secondary | ICD-10-CM | POA: Diagnosis not present

## 2021-02-01 DIAGNOSIS — Z0000 Encounter for general adult medical examination without abnormal findings: Secondary | ICD-10-CM | POA: Diagnosis not present

## 2021-02-01 DIAGNOSIS — E785 Hyperlipidemia, unspecified: Secondary | ICD-10-CM | POA: Diagnosis not present

## 2021-02-01 DIAGNOSIS — I10 Essential (primary) hypertension: Secondary | ICD-10-CM

## 2021-02-01 DIAGNOSIS — M1990 Unspecified osteoarthritis, unspecified site: Secondary | ICD-10-CM | POA: Diagnosis not present

## 2021-02-01 LAB — CBC WITH DIFFERENTIAL/PLATELET
Basophils Absolute: 0.1 10*3/uL (ref 0.0–0.1)
Basophils Relative: 1.5 % (ref 0.0–3.0)
Eosinophils Absolute: 0.2 10*3/uL (ref 0.0–0.7)
Eosinophils Relative: 4.6 % (ref 0.0–5.0)
HCT: 44.2 % (ref 36.0–46.0)
Hemoglobin: 14.5 g/dL (ref 12.0–15.0)
Lymphocytes Relative: 21.5 % (ref 12.0–46.0)
Lymphs Abs: 0.9 10*3/uL (ref 0.7–4.0)
MCHC: 32.9 g/dL (ref 30.0–36.0)
MCV: 97.1 fl (ref 78.0–100.0)
Monocytes Absolute: 0.4 10*3/uL (ref 0.1–1.0)
Monocytes Relative: 8.4 % (ref 3.0–12.0)
Neutro Abs: 2.8 10*3/uL (ref 1.4–7.7)
Neutrophils Relative %: 64 % (ref 43.0–77.0)
Platelets: 258 10*3/uL (ref 150.0–400.0)
RBC: 4.56 Mil/uL (ref 3.87–5.11)
RDW: 13.4 % (ref 11.5–15.5)
WBC: 4.4 10*3/uL (ref 4.0–10.5)

## 2021-02-01 LAB — TSH: TSH: 1.12 u[IU]/mL (ref 0.35–5.50)

## 2021-02-01 LAB — T4, FREE: Free T4: 0.74 ng/dL (ref 0.60–1.60)

## 2021-02-01 NOTE — Progress Notes (Signed)
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  No alarming or surprising findings Proceed with plan we discussed.

## 2021-02-01 NOTE — Progress Notes (Signed)
Chief Complaint  Patient presents with   Annual Exam    HPI: Patient  Briana Giles  75 y.o. comes in today for Preventive Health Care visit  Bp  in range  no concerns se  Arthritis  but stay ing active  neck and back .   Neck is most problematic  dr D following pain management  no radicular sx  Husband  says ? Memory  she feels ok   has to ask for repeated details but not dates and no  getting lost.  Health Maintenance  Topic Date Due   COVID-19 Vaccine (5 - Booster for [place] series) 08/02/2021 (Originally 12/29/2020)   TETANUS/TDAP  01/07/2024   Pneumonia Vaccine 28+ Years old  Completed   INFLUENZA VACCINE  Completed   DEXA SCAN  Completed   Hepatitis C Screening  Completed   Zoster Vaccines- Shingrix  Completed   HPV VACCINES  Aged Out   COLONOSCOPY (Pts 45-5yrs Insurance coverage will need to be confirmed)  Discontinued   Health Maintenance Review LIFESTYLE:  Ex[facility] and yoga and walks  daily .  Tobacco/ETS: n Alcohol: wine with dinner and before bed  Sugar beverages:coffee  Sleep: taking trazodone. At night  doing better at home  Drug use: no HH of   2  3 pets  cats  Retired  Lobbyist gummy .   ROS:  REST of 12 system review negative except as per HPI   Past Medical History:  Diagnosis Date   Abdominal pain, other specified site    Abnormal LFTs    Allergic rhinitis    Cervical back pain with evidence of disc disease    GERD (gastroesophageal reflux disease)    Heart murmur    says all her life had [place] in [place] year ago not to worry   Hypertension    Nerve damage    left arm   Osteoarthritis    Osteopenia    Phlebitis    Spinal stenosis    Transient global amnesia     Past Surgical History:  Procedure Laterality Date   BUNIONECTOMY     fall 2022 [facility]   CHOLECYSTECTOMY     CYST EXCISION Right 01/2018   right thumb    FOOT SURGERY      x 2     HAND SURGERY     right hand   KNEE ARTHROSCOPY     right    REPLACEMENT UNICONDYLAR JOINT KNEE     rt knee   TONSILLECTOMY     TOTAL KNEE ARTHROPLASTY Left 06/2019    Family History  Problem Relation Age of Onset   Hypertension Father    Alcohol abuse Father    Lung cancer Father    Cancer Maternal Grandmother        sinus   COPD Mother    Osteoporosis Mother    Heart disease Mother    Hypertension Brother    Heart Problems Brother    Arthritis Brother    Prostate cancer Maternal Grandfather    Heart attack Brother    Hypertension Brother    Healthy Daughter    Healthy Son    Colon cancer Neg Hx    Pancreatic cancer Neg Hx    Stomach cancer Neg Hx     Social History   Socioeconomic History   Marital status: Married    Spouse name: Not on file   Number of children: 2  Years of education: Not on file   Highest education level: Not on file  Occupational History   Occupation: retired Product manager: RETIRED  Tobacco Use   Smoking status: Never   Smokeless tobacco: Never  Vaping Use   Vaping Use: Never used  Substance and Sexual Activity   Alcohol use: Yes    Comment: occasional    Drug use: Never   Sexual activity: Not on file  Other Topics Concern   Not on file  Social History Narrative   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  [place] grand child    Does water exercise aerobics walking elliptical    Social Determinants of Health   Financial Resource Strain: Low Risk    Difficulty of Paying Living Expenses: Not hard at all  Food Insecurity: No Food Insecurity   Worried About Charity fundraiser in the Last Year: Never true   Arboriculturist in the Last Year: Never true  Transportation Needs: No Transportation Needs   Lack of Transportation (Medical): No   Lack of Transportation (Non-Medical): No  Physical Activity: Sufficiently Active   Days of Exercise per Week: 5 days   Minutes of Exercise per Session: 60 min  Stress: No Stress  Concern Present   Feeling of Stress : Only a little  Social Connections: Moderately Integrated   Frequency of Communication with Friends and Family: Three times a week   Frequency of Social Gatherings with Friends and Family: Three times a week   Attends Religious Services: Never   Active Member of Clubs or Organizations: Yes   Attends Archivist Meetings: 1 to 4 times per year   Marital Status: Married    Outpatient Medications Prior to Visit  Medication Sig Dispense Refill   acetaminophen (TYLENOL) 500 MG tablet Take 1,000 mg by mouth every 6 (six) hours as needed for mild pain, moderate pain or headache.     fluticasone (FLONASE) 50 MCG/ACT nasal spray USE TWO SPRAYS IN EACH NOSTRIL ONCE DAILY. (Patient taking differently: as needed.) 16 g 5   lisinopril (ZESTRIL) 20 MG tablet TAKE 1 TABLET BY MOUTH EVERY DAY 90 tablet 2   polyethylene glycol powder (MIRALAX) powder Take 17 g by mouth once. (Patient taking differently: Take 17 g by mouth as needed.) 255 g 0   tiZANidine (ZANAFLEX) 4 MG tablet Take 1 tablet (4 mg total) by mouth at bedtime. 30 tablet 0   traMADol (ULTRAM) 50 MG tablet Take 1 tablet (50 mg total) by mouth daily as needed. 30 tablet 0   traZODone (DESYREL) 50 MG tablet TAKE 1 TABLET(50 MG) BY MOUTH AT BEDTIME AS NEEDED FOR SLEEP. MAY INCREASE TO 1 AND 1/2 TABLETS 75 MG DOSE 30 tablet 1   benzonatate (TESSALON) 200 MG capsule Take 1 capsule (200 mg total) by mouth 2 (two) times daily as needed for cough. (Patient not taking: Reported on 02/01/2021) 20 capsule 0   No facility-administered medications prior to visit.     EXAM:  BP 124/82 (BP Location: Left Arm, Patient Position: Sitting, Cuff Size: Normal)    Pulse 84    Temp 98 F (36.7 C) (Oral)    Ht 5\' 1"  (1.549 m)    Wt 133 lb 9.6 oz (60.6 kg)    BMI 25.24 kg/m   Body mass index is 25.24 kg/m. Wt Readings from Last  3 Encounters:  02/01/21 133 lb 9.6 oz (60.6 kg)  09/10/20 135 lb 9.6 oz (61.5 kg)   08/04/20 136 lb (61.7 kg)    Physical Exam: Vital signs reviewed NIO:EVOJ is a well-developed well-nourished alert cooperative    who appearsr stated age in no acute distress.  HEENT: normocephalic atraumatic , Eyes: PERRL EOM's full, conjunctiva clear, Nares: paten,t no deformity discharge or tenderness., Ears: no deformity EAC's clear TMs with normal landmarks. Mouth:masked  NECK: slightly stiff  no point tenderness without masses, thyromegaly or bruits. CHEST/PULM:  Clear to auscultation and percussion breath sounds equal no wheeze , rales or rhonchi. No chest wall deformities or tenderness. Breast: normal by inspection . No dimpling, discharge, masses, tenderness or discharge . Inverted nipples preexisting  CV: PMI is nondisplaced, S1 S2 no gallops, murmurs, rubs. Peripheral pulses are full without delay.No JVD .  ABDOMEN: Bowel sounds normal nontender  No guard or rebound, no hepato splenomegal no CVA tenderness.   Extremtities:  No clubbing cyanosis or edema, no acute joint swelling or redness no focal atrophy NEURO:  Oriented x3, cranial nerves 3-12 appear to be intact, no obvious focal weakness,gait within normal limits no abnormal reflexes or asymmetrical SKIN: No acute rashes normal turgor, color, no bruising or petechiae. PSYCH: Oriented, good eye contact, no obvious depression anxiety, cognition and judgment appear normal. LN: no cervical axillary inguinal adenopathy  Lab Results  Component Value Date   WBC 7.8 05/29/2019   HGB 14.1 05/29/2019   HCT 42.6 05/29/2019   PLT 286.0 05/29/2019   GLUCOSE 85 06/04/2019   CHOL 242 (H) 12/26/2018   TRIG 130.0 12/26/2018   HDL 65.30 12/26/2018   LDLDIRECT 127.4 12/27/2011   LDLCALC 151 (H) 12/26/2018   ALT 20 12/26/2018   AST 20 12/26/2018   NA 135 06/04/2019   K 4.9 06/04/2019   CL 98 06/04/2019   CREATININE 0.91 06/04/2019   BUN 21 06/04/2019   CO2 30 06/04/2019   TSH 1.97 12/26/2018   INR 0.9 05/29/2019    BP Readings  from Last 3 Encounters:  02/01/21 124/82  09/10/20 138/71  08/04/20 140/70    Lab plan reviewed with patient   fasting.   ASSESSMENT AND PLAN:  Discussed the following assessment and plan:    ICD-10-CM   1. Visit for preventive health examination  Z00.00     2. Essential hypertension  J00 Basic metabolic panel    CBC with Differential/Platelet    Hepatic function panel    Lipid panel    TSH    T4, free    Vitamin X38    Basic metabolic panel    CBC with Differential/Platelet    Hepatic function panel    Lipid panel    TSH    T4, free    Vitamin B12    3. Medication management  H82.993 Basic metabolic panel    CBC with Differential/Platelet    Hepatic function panel    Lipid panel    TSH    T4, free    Vitamin Z16    Basic metabolic panel    CBC with Differential/Platelet    Hepatic function panel    Lipid panel    TSH    T4, free    Vitamin B12    4. Osteoarthritis, unspecified osteoarthritis type, unspecified site  R67.89 Basic metabolic panel    CBC with Differential/Platelet    Hepatic function panel    Lipid panel    TSH  T4, free    Vitamin E72    Basic metabolic panel    CBC with Differential/Platelet    Hepatic function panel    Lipid panel    TSH    T4, free    Vitamin B12    5. Hyperlipidemia, unspecified hyperlipidemia type  C94.7 Basic metabolic panel    CBC with Differential/Platelet    Hepatic function panel    Lipid panel    TSH    T4, free    Vitamin S96    Basic metabolic panel    CBC with Differential/Platelet    Hepatic function panel    Lipid panel    TSH    T4, free    Vitamin B12    6. Neck pain  M54.2 DG Cervical Spine Complete    Basic metabolic panel    CBC with Differential/Platelet    Hepatic function panel    Lipid panel    TSH    T4, free    Vitamin G83    Basic metabolic panel    CBC with Differential/Platelet    Hepatic function panel    Lipid panel    TSH    T4, free    Vitamin B12    7.  Complaints of memory disturbance  R41.3 Basic metabolic panel    CBC with Differential/Platelet    Hepatic function panel    Lipid panel    TSH    T4, free    Vitamin M62    Basic metabolic panel    CBC with Differential/Platelet    Hepatic function panel    Lipid panel    TSH    T4, free    Vitamin B12   by family   try sage testing.  caution with  meds that can add to thisand fu if progressive concerns    UTD  Flu vaccine at pharmacy  Will try PT for neck  prob djd arthritis  Lab and fu  Return for depending on results and how d oing  and a year .  Patient Care Team: Alisia Vanengen, Standley Brooking, MD as PCP - General [facility], Tamala Fothergill, DPM (Podiatry) Bo Merino, MD (Rheumatology) Milus Banister, MD (Gastroenterology) Jari Pigg, MD as Attending Physician (Dermatology) Laurance Flatten, MD as Referring Physician (Orthopedic Surgery) Patient Instructions  Consider try home SAGE  test for screen . Labs today  Physical therapy may help neck pain.   Lab and  neck x ray today .    FU depending or a year .         Standley Brooking. Yina Riviere M.D.

## 2021-02-01 NOTE — Patient Instructions (Addendum)
Consider try home SAGE  test for screen . Labs today  Physical therapy may help neck pain.   Lab and  neck x ray today .    FU depending or a year .

## 2021-02-02 LAB — HEPATIC FUNCTION PANEL
ALT: 28 U/L (ref 0–35)
AST: 25 U/L (ref 0–37)
Albumin: 4.5 g/dL (ref 3.5–5.2)
Alkaline Phosphatase: 103 U/L (ref 39–117)
Bilirubin, Direct: 0 mg/dL (ref 0.0–0.3)
Total Bilirubin: 0.4 mg/dL (ref 0.2–1.2)
Total Protein: 7.7 g/dL (ref 6.0–8.3)

## 2021-02-02 LAB — LIPID PANEL
Cholesterol: 226 mg/dL — ABNORMAL HIGH (ref 0–200)
HDL: 69.4 mg/dL (ref >39.00)
LDL Cholesterol: 139 mg/dL — ABNORMAL HIGH (ref 0–99)
NonHDL: 156.81
Total CHOL/HDL Ratio: 3
Triglycerides: 90 mg/dL (ref 0.0–149.0)
VLDL: 18 mg/dL (ref 0.0–40.0)

## 2021-02-02 LAB — BASIC METABOLIC PANEL
BUN: 21 mg/dL (ref 6–23)
CO2: 28 mEq/L (ref 19–32)
Calcium: 10.1 mg/dL (ref 8.4–10.5)
Chloride: 103 mEq/L (ref 96–112)
Creatinine, Ser: 0.91 mg/dL (ref 0.40–1.20)
GFR: 61.33 mL/min (ref >60.00)
Glucose, Bld: 87 mg/dL (ref 70–99)
Potassium: 5 mEq/L (ref 3.5–5.1)
Sodium: 140 mEq/L (ref 135–145)

## 2021-02-02 LAB — VITAMIN B12: Vitamin B-12: 326 pg/mL (ref 211–911)

## 2021-02-05 NOTE — Progress Notes (Signed)
Results are in range  excepts lipids cholesterol which are  better than last year.  Continue lifestyle intervention healthy eating and physical activity as tolerated . Forwarding results to dr Estanislado Pandy

## 2021-02-25 ENCOUNTER — Other Ambulatory Visit: Payer: Self-pay | Admitting: Rheumatology

## 2021-02-25 NOTE — Telephone Encounter (Signed)
Next Visit: 03/16/2020   Last Visit: 09/10/2020   UDS: 09/10/2020   Narc Agreement: 09/10/2020   Last Fill: 01/14/2021   Okay to refill Tramadol?

## 2021-03-02 NOTE — Progress Notes (Signed)
Office Visit Note  Patient: Briana Giles             Date of Birth: [birth year]           MRN: [id number]             PCP: Madelin Headings, MD Referring: Madelin Headings, MD Visit Date: 03/16/2021 Occupation: @GUAROCC @  Subjective:  Back pain   History of Present Illness: Briana Giles is a 76 y.o. female with history of osteoarthritis and DDD.  Patient presents today with increased discomfort in her neck, thoracic spine, and lower back.  She has not had any recent injury or falls.  She is not experiencing any nocturnal pain.  She describes the pain as an aching sensation.  The discomfort in her lower back has been mainly right-sided.  At times she feels a catching sensation in her lower back and has some radiating pain down her right leg intermittently.  She was recently evaluated by her PCP for increased neck pain and stiffness.  She had x-rays of the C-spine on 02/01/2021.  There was discussion of a referral to physical therapy but she held off at that time.  She is open to trying physical therapy now.  She reports that she tries to remain active going to the [employer] 4 days a week and walks daily for exercise.  While working out at [employer] she notices some increased muscular strain on the left side in the thoracic region at times.  Last night she applied some over-the-counter topical agent to that area and her discomfort has improved today.  She has not been experiencing any muscle spasms.  She does have a prescription for tizanidine 4 mg at bedtime as needed for muscle spasms which she has not taken recently.  She does take tramadol 50 mg 1 tablet daily as needed for pain relief.  She is also been taking trazodone at bedtime for insomnia. She has not scheduled her DEXA.      Activities of Daily Living:  Patient reports morning stiffness for 30-60 minutes.   Patient Denies nocturnal pain.  Difficulty dressing/grooming: Denies Difficulty climbing stairs: Denies Difficulty getting  out of chair: Denies Difficulty using hands for taps, buttons, cutlery, and/or writing: Denies  Review of Systems  Constitutional:  Negative for fatigue.  HENT:  Negative for mouth sores, mouth dryness and nose dryness.   Eyes:  Negative for pain, itching and dryness.  Respiratory:  Negative for shortness of breath and difficulty breathing.   Cardiovascular:  Negative for chest pain and palpitations.  Gastrointestinal:  Negative for blood in stool, constipation and diarrhea.  Endocrine: Negative for increased urination.  Genitourinary:  Negative for difficulty urinating.  Musculoskeletal:  Positive for myalgias, morning stiffness and myalgias. Negative for joint pain, joint pain, joint swelling and muscle tenderness.  Skin:  Negative for color change, rash and redness.  Allergic/Immunologic: Negative for susceptible to infections.  Neurological:  Negative for dizziness, numbness, headaches, memory loss and weakness.  Hematological:  Positive for bruising/bleeding tendency.  Psychiatric/Behavioral:  Negative for confusion.    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   Abdominal bruit 12/31/2012   Encounter for Medicare annual wellness exam 12/31/2012   Degenerative joint disease of spine multilevel 03/07/2012   Spinal stenosis, lumbar 03/07/2012   Hip pain 09/02/2011   Sciatica of left side 08/14/2011   Spinal stenosis of lumbar region 08/14/2011   Encounter for long-term (current) use of other medications 06/25/2011   High risk medication use  12/21/2010   Need for lipid screening 12/21/2010   Foot pain, left 07/22/2010   CHEST PAIN 02/18/2010   Osteopenia 07/05/2009   TREMOR, RIGHT HAND 01/26/2009   BACK PAIN 09/30/2008   NEOPLASM, SKIN, UNCERTAIN BEHAVIOR 05/27/2008   HYPERPOTASSEMIA 05/27/2008   Essential hypertension 10/12/2007   TRANSIENT GLOBAL AMNESIA 10/12/2007   VARICOSE VEINS, LOWER EXTREMITIES 06/06/2007   SYSTOLIC MURMUR 06/06/2007   ABDOMINAL BRUIT 06/06/2007   ALLERGIC RHINITIS 08/17/2006   GERD 08/17/2006   Osteoarthritis 08/17/2006   Disturbance in sleep behavior 08/17/2006    Past Medical History:  Diagnosis Date   Abdominal pain, other specified site    Abnormal LFTs    Allergic rhinitis    Cervical back pain with evidence of disc disease    GERD (gastroesophageal reflux disease)    Heart murmur    says all her life had [place] year ago not to worry   Hypertension    Nerve damage    left arm   Osteoarthritis    Osteopenia    Phlebitis    Spinal stenosis    Transient global amnesia     Family History  Problem Relation Age of Onset   Hypertension Father    Alcohol abuse Father    Lung cancer Father    Cancer Maternal Grandmother        sinus   COPD Mother    Osteoporosis Mother    Heart disease Mother    Hypertension Brother    Heart Problems Brother    Arthritis Brother    Prostate cancer Maternal Grandfather    Heart attack Brother    Hypertension Brother    Healthy Daughter    Healthy Son    Colon cancer Neg Hx    Pancreatic cancer Neg Hx    Stomach cancer Neg Hx    Past Surgical History:  Procedure Laterality Date   BUNIONECTOMY     fall 2022 [facility]   CHOLECYSTECTOMY     CYST EXCISION Right 01/2018   right thumb    FOOT SURGERY      x 2     HAND SURGERY     right hand   KNEE ARTHROSCOPY     right   REPLACEMENT UNICONDYLAR JOINT KNEE     rt knee   TONSILLECTOMY     TOTAL KNEE ARTHROPLASTY Left 06/2019   Social History   Social History Narrative    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  [place] grand child    Does water exercise aerobics walking elliptical    Immunization History  Administered Date(s) Administered   Fluad Quad(high Dose 65+) 12/14/2020   Influenza Split 12/27/2011, 10/15/2012   Influenza Whole 12/03/2007, 12/24/2009, 10/25/2010   Influenza, High Dose Seasonal PF 12/31/2013, 11/06/2014, 12/10/2016, 10/25/2017, 10/31/2018   Influenza,inj,quad, With Preservative 11/10/2017   Influenza-Unspecified 11/14/2016   PFIZER(Purple Top)SARS-COV-2 Vaccination 03/08/2019, 03/30/2019, 10/30/2019, 11/03/2020   Pfizer Covid-19 Theatre manager  11yrs & up 11/03/2020   Pneumococcal Conjugate-13 01/06/2014   Pneumococcal Polysaccharide-23 12/21/2010   Td 05/27/2008   Tdap 01/06/2014   Zoster Recombinat (Shingrix) 10/26/2017, 10/31/2018   Zoster, Live 12/03/2007     Objective: Vital Signs: BP 140/77 (BP Location: Left Arm, Patient Position: Sitting, Cuff Size: Normal)    Pulse 81    Ht 5' (1.524 m)    Wt 134 lb 9.6 oz (61.1 kg)    BMI 26.29 kg/m    Physical Exam Vitals and nursing note reviewed.  Constitutional:      Appearance: She is well-developed.  HENT:     Head: Normocephalic and atraumatic.  Eyes:     Conjunctiva/sclera: Conjunctivae normal.  Pulmonary:     Effort: Pulmonary effort is normal.  Abdominal:     Palpations: Abdomen is soft.  Musculoskeletal:     Cervical back: Normal range of motion.  Skin:    General: Skin is warm and dry.     Capillary Refill: Capillary refill takes less than 2 seconds.  Neurological:     Mental Status: She is alert and oriented to person, place, and time.  Psychiatric:        Behavior: Behavior normal.     Musculoskeletal Exam: C-spine, thoracic spine, and lumbar spine good ROM.  No midline spinal tenderness.  Tenderness over the right SI joint. Shoulder joints, elbow joints, wrist joints, MCPs,  PIPs, and DIPs good ROM with no synovitis.  PIP and DIP thickening consistent with osteoarthritis of both hands.  Complete fist formation bilaterally.  Hip joints, knee joints, and ankle joints have good ROM with no discomfort.  No warmth or effusion of knee joints.  No tenderness or swelling of ankle joints. No tenderness over MTP joints.  CDAI Exam: CDAI Score: -- Patient Global: --; Provider Global: -- Swollen: --; Tender: -- Joint Exam 03/16/2021   No joint exam has been documented for this visit   There is currently no information documented on the homunculus. Go to the Rheumatology activity and complete the homunculus joint exam.  Investigation: No additional findings.  Imaging: XR Lumbar Spine 2-3 Views  Result Date: 03/16/2021 Levoscoliosis was noted.  Multilevel spondylosis with severe narrowing between T12-L1, L2-L3, L3-L4, and L4-L5 was noted.  Facet joint narrowing with foraminal narrowing was noted. Impression: Multilevel spondylosis with significant discussed was narrowing and foraminal narrowing was noted.  XR Pelvis 1-2 Views  Result Date: 03/16/2021 No SI joint narrowing or sclerosis was noted.  No hip joint narrowing was noted. Impression: Unremarkable x-ray of the SI joints.   Recent Labs: Lab Results  Component Value Date   WBC 4.4 02/01/2021   HGB 14.5 02/01/2021   PLT 258.0 02/01/2021   NA 140 02/01/2021   K 5.0 02/01/2021   CL 103 02/01/2021   CO2 28 02/01/2021   GLUCOSE 87 02/01/2021   BUN 21 02/01/2021   CREATININE 0.91 02/01/2021   BILITOT 0.4 02/01/2021   ALKPHOS 103 02/01/2021   AST 25 02/01/2021   ALT 28 02/01/2021   PROT 7.7 02/01/2021   ALBUMIN 4.5 02/01/2021   CALCIUM 10.1 02/01/2021   GFRAA 68 12/15/2016    Speciality Comments: No specialty comments available.  Procedures:  No procedures performed Allergies: Codeine phosphate       Assessment / Plan:     Visit Diagnoses: Primary osteoarthritis of both hands: She has PIP and  DIP thickening consistent with osteoarthritis of both hands.  No tenderness or inflammation was noted on examination today.  Complete fist formation bilaterally.  Discussed the importance of joint protectin and muscle strengthening.  S/P total knee arthroplasty, left: Doing well.  She has good ROM with no discomfort.  No warmth or effusion noted.  She has been walking daily for exercise and goes to [employer] 4 days per week.   History of partial knee replacement: Right-Doing well. She has good ROM with no discomfort in the right knee. She goes to [employer] 4 days per week.  Primary osteoarthritis of both feet: She is not experiencing any discomfort in her feet at this time.  She has good ROM of both ankle joints with no tenderness or joint swelling.  She is wearing proper fitting shoes.  She has been walking daily for exercise.   History of bunionectomy  DDD (degenerative disc disease), cervical -She has been experiencing increased neck pain and stiffness over the past couple of months.  No recent injury.  She was evaluated by her PCP who ordered an updated x-ray of the C-spine for further evaluation.  X-rays of the C-spine updated on 02/01/21: Multilevel spondylosis and facet hypertrophy, greatest C4-C5 through C6-7. She continues to take zanaflex 4 mg at bedtime for muscle spasms.  Referral to innovative therapies was placed today as requested by the patient.  She was advised to notify us if her discomfort persists or worsens.  Plan: Ambulatory referral to Physical Therapy  DDD (degenerative disc disease), lumbar -She presents today with increased discomfort in her lower back.  No recent injury or fall.  She was a previous patient of Dr. Alvester Morin who performed a spinal nerve ablation in the past per patient.  Her discomfort has been mainly right-sided lower back pain.  On examination today she does not have any midline spinal tenderness.  She has some tenderness over the right SI joint.   X-rays of the lumbar spine and pelvis were updated today.  Results were discussed with the patient while she was in the office.  She requested a referral to integrative therapies.  She was also given a handout of back exercises to perform.  If her symptoms persist or worsen she was advised to schedule an appointment with Dr. For further evaluation and management.  Plan: Ambulatory referral to Physical Therapy  Chronic right SI joint pain -She presents today with increased discomfort in her lower back.  Most of her discomfort has been right-sided.  She has not had any recent injuries or falls.  She has been experiencing intermittent symptoms of sciatica.  On examination today she does not have any midline spinal tenderness.  She had right SI joint tenderness.  Referral to physical therapy was placed today.  She was advised to follow-up with Dr. Theresia Bough if her symptoms persist or worsen.  Plan: XR Pelvis 1-2 Views, XR Lumbar Spine 2-3 Views, Ambulatory referral to Physical Therapy  Chronic right-sided low back pain with right-sided sciatica -X-rays of the lumbar spine were updated today: Multilevel spondylosis with significant discussed was  narrowing and foraminal narrowing was noted.referral to physical therapy was placed today.  She was advised to notify us if her symptoms persist or worsen.  She will also follow-up with Dr. Alvester Morin if her symptoms progress. Plan: XR Pelvis 1-2 Views, XR Lumbar Spine 2-3 Views, Ambulatory referral to Physical Therapy  Other chronic pain - She is taking tramadol 50 mg 1 tablet by mouth daily as needed for pain relief. UDS and narcotic agreement will be updated today.   Medication monitoring encounter -UDS and  narcotic agreement were updated today.  Plan: DRUG MONITOR, TRAMADOL,QN, URINE, DRUG MONITOR, PANEL 5, W/CONF, URINE  Osteopenia of multiple sites - Most recent DEXA was in 2016.  She is overdue to update DEXA.  Dr. Fabian Sharp has placed a new order for a DXA but she has  not called to schedule an appointment yet.  The patient was strongly encouraged to schedule an updated DXA.  Other medical conditions are listed as follows:   Family history of rheumatoid arthritis  History of hypertension  Abnormal serum angiotensin-converting enzyme level  History of varicose veins  History of gastroesophageal reflux (GERD)   Orders: Orders Placed This Encounter  Procedures   XR Pelvis 1-2 Views   XR Lumbar Spine 2-3 Views   DRUG MONITOR, TRAMADOL,QN, URINE   DRUG MONITOR, PANEL 5, W/CONF, URINE   Ambulatory referral to Physical Therapy   No orders of the defined types were placed in this encounter.    Follow-Up Instructions: Return in about 6 months (around 09/13/2021) for Osteoarthritis, DDD.   Gearldine Bienenstock, PA-C  Note - This record has been created using Dragon software.  Chart creation errors have been sought, but may not always  have been located. Such creation errors do not reflect on  the standard of medical care.

## 2021-03-16 ENCOUNTER — Ambulatory Visit (INDEPENDENT_AMBULATORY_CARE_PROVIDER_SITE_OTHER): Payer: Medicare HMO | Admitting: Physician Assistant

## 2021-03-16 ENCOUNTER — Encounter: Payer: Self-pay | Admitting: Physician Assistant

## 2021-03-16 ENCOUNTER — Ambulatory Visit: Payer: Self-pay

## 2021-03-16 ENCOUNTER — Other Ambulatory Visit: Payer: Self-pay

## 2021-03-16 VITALS — BP 140/77 | HR 81 | Ht 60.0 in | Wt 134.6 lb

## 2021-03-16 DIAGNOSIS — Z8261 Family history of arthritis: Secondary | ICD-10-CM

## 2021-03-16 DIAGNOSIS — Z96659 Presence of unspecified artificial knee joint: Secondary | ICD-10-CM

## 2021-03-16 DIAGNOSIS — Z96652 Presence of left artificial knee joint: Secondary | ICD-10-CM | POA: Diagnosis not present

## 2021-03-16 DIAGNOSIS — M5441 Lumbago with sciatica, right side: Secondary | ICD-10-CM

## 2021-03-16 DIAGNOSIS — M19041 Primary osteoarthritis, right hand: Secondary | ICD-10-CM | POA: Diagnosis not present

## 2021-03-16 DIAGNOSIS — M533 Sacrococcygeal disorders, not elsewhere classified: Secondary | ICD-10-CM

## 2021-03-16 DIAGNOSIS — Z8719 Personal history of other diseases of the digestive system: Secondary | ICD-10-CM

## 2021-03-16 DIAGNOSIS — M5030 Other cervical disc degeneration, unspecified cervical region: Secondary | ICD-10-CM

## 2021-03-16 DIAGNOSIS — M19071 Primary osteoarthritis, right ankle and foot: Secondary | ICD-10-CM | POA: Diagnosis not present

## 2021-03-16 DIAGNOSIS — G8929 Other chronic pain: Secondary | ICD-10-CM

## 2021-03-16 DIAGNOSIS — Z5181 Encounter for therapeutic drug level monitoring: Secondary | ICD-10-CM

## 2021-03-16 DIAGNOSIS — Z8679 Personal history of other diseases of the circulatory system: Secondary | ICD-10-CM

## 2021-03-16 DIAGNOSIS — M19072 Primary osteoarthritis, left ankle and foot: Secondary | ICD-10-CM

## 2021-03-16 DIAGNOSIS — Z9889 Other specified postprocedural states: Secondary | ICD-10-CM

## 2021-03-16 DIAGNOSIS — R748 Abnormal levels of other serum enzymes: Secondary | ICD-10-CM

## 2021-03-16 DIAGNOSIS — M5136 Other intervertebral disc degeneration, lumbar region: Secondary | ICD-10-CM

## 2021-03-16 DIAGNOSIS — M8589 Other specified disorders of bone density and structure, multiple sites: Secondary | ICD-10-CM

## 2021-03-16 DIAGNOSIS — M19042 Primary osteoarthritis, left hand: Secondary | ICD-10-CM

## 2021-03-16 NOTE — Patient Instructions (Signed)
Sacroiliac Joint Dysfunction °Sacroiliac joint dysfunction is a condition that causes inflammation on one or both sides of the sacroiliac (SI) joint. The SI joint is the joint between two bones of the pelvis called the sacrum and the ilium. The sacrum is the bone at the base of the spine. The ilium is the large bone that forms the hip. This condition causes deep aching or burning pain in the low back. In some cases, the pain may also spread into one or both buttocks, hips, or thighs. °What are the causes? °This condition may be caused by: °Pregnancy. During pregnancy, extra stress is put on the SI joints because the pelvis widens. °Injury, such as: °Injuries from car crashes. °Sports-related injuries. °Work-related injuries. °Having one leg that is shorter than the other. °Conditions that affect the joints, such as: °Rheumatoid arthritis. °Gout. °Psoriatic arthritis. °Joint infection (septic arthritis). °Sometimes, the cause of SI joint dysfunction is not known. °What are the signs or symptoms? °Symptoms of this condition include: °Aching or burning pain in the lower back. The pain may also spread to other areas, such as: °Buttocks. °Groin. °Thighs. °Muscle spasms in or around the painful areas. °Increased pain when standing, walking, running, stair climbing, bending, or lifting. °How is this diagnosed? °This condition is diagnosed with a physical exam and your medical history. During the exam, the health care provider may move one or both of your legs to different positions to check for pain. Various tests may be done to confirm the diagnosis, including: °Imaging tests to look for other causes of pain. These may include: °MRI. °CT scan. °Bone scan. °Diagnostic injection. A numbing medicine is injected into the SI joint using a needle. If your pain is temporarily improved or stopped after the injection, this can indicate that SI joint dysfunction is the problem. °How is this treated? °Treatment depends on the cause  and severity of your condition. Treatment options can be noninvasive and may include: °Ice or heat applied to the lower back area after an injury. This may help reduce pain and muscle spasms. °Medicines to relieve pain or inflammation or to relax the muscles. °Wearing a back brace (sacroiliac brace) to help support the joint while your back is healing. °Physical therapy to increase muscle strength around the joint and flexibility at the joint. This may also involve learning proper body positions and ways of moving to relieve stress on the joint. °Direct manipulation of the SI joint. °Use of a device that provides electrical stimulation to help reduce pain at the joint. °Other treatments may include: °Injections of steroid medicine into the joint to reduce pain and swelling. °Radiofrequency ablation. This treatment uses heat to burn away nerves that are carrying pain messages from the joint. °Surgery to put in screws and plates that limit or prevent joint motion. This is rare. °Follow these instructions at home: °Medicines °Take over-the-counter and prescription medicines only as told by your health care provider. °Ask your health care provider if the medicine prescribed to you: °Requires you to avoid driving or using machinery. °Can cause constipation. You may need to take these actions to prevent or treat constipation: °Drink enough fluid to keep your urine pale yellow. °Take over-the-counter or prescription medicines. °Eat foods that are high in fiber, such as beans, whole grains, and fresh fruits and vegetables. °Limit foods that are high in fat and processed sugars, such as fried or sweet foods. °If you have a brace: °Wear the brace as told by your health care provider. Remove   it only as told by your health care provider. Keep the brace clean. If the brace is not waterproof: Do not let it get wet. Cover it with a watertight covering when you take a bath or a shower. Managing pain, stiffness, and swelling    Icing can help with pain and swelling. Heat may help with muscle tension or spasms. Ask your health care provider if you should use ice or heat. If directed, put ice on the affected area: If you have a removable brace, remove it as told by your health care provider. Put ice in a plastic bag. Place a towel between your skin and the bag. Leave the ice on for 20 minutes, 2-3 times a day. Remove the ice if your skin turns bright red. This is very important. If you cannot feel pain, heat, or cold, you have a greater risk of damage to the area. If directed, apply heat to the affected area as often as told by your health care provider. Use the heat source that your health care provider recommends, such as a moist heat pack or a heating pad. Place a towel between your skin and the heat source. Leave the heat on for 20-30 minutes. Remove the heat if your skin turns bright red. This is especially important if you are unable to feel pain, heat, or cold. You may have a greater risk of getting burned. General instructions Rest as needed. Return to your normal activities as told by your health care provider. Ask your health care provider what activities are safe for you. Do exercises as told by your health care provider or physical therapist. Keep all follow-up visits. This is important. Contact a health care provider if: Your pain is not controlled with medicine. You have a fever. Your pain is getting worse. Get help right away if: You have weakness, numbness, or tingling in your legs or feet. You lose control of your bladder or bowels. Summary Sacroiliac (SI) joint dysfunction is a condition that causes inflammation on one or both sides of the SI joint. This condition causes deep aching or burning pain in the low back. In some cases, the pain may also spread into one or both buttocks, hips, or thighs. Treatment depends on the cause and severity of your condition. It may include medicines to reduce  pain and swelling or to relax muscles. This information is not intended to replace advice given to you by your health care provider. Make sure you discuss any questions you have with your health care provider.  Back Exercises These exercises help to make your trunk and back strong. They also help to keep the lower back flexible. Doing these exercises can help to prevent or lessen pain in your lower back. If you have back pain, try to do these exercises 2-3 times each day or as told by your doctor. As you get better, do the exercises once each day. Repeat the exercises more often as told by your doctor. To stop back pain from coming back, do the exercises once each day, or as told by your doctor. Do exercises exactly as told by your doctor. Stop right away if you feel sudden pain or your pain gets worse. Exercises Single knee to chest Do these steps 3-5 times in a row for each leg: Lie on your back on a firm bed or the floor with your legs stretched out. Bring one knee to your chest. Grab your knee or thigh with both hands and hold it in  place. Pull on your knee until you feel a gentle stretch in your lower back or butt. Keep doing the stretch for 10-30 seconds. Slowly let go of your leg and straighten it. Pelvic tilt Do these steps 5-10 times in a row: Lie on your back on a firm bed or the floor with your legs stretched out. Bend your knees so they point up to the ceiling. Your feet should be flat on the floor. Tighten your lower belly (abdomen) muscles to press your lower back against the floor. This will make your tailbone point up to the ceiling instead of pointing down to your feet or the floor. Stay in this position for 5-10 seconds while you gently tighten your muscles and breathe evenly. Cat-cow Do these steps until your lower back bends more easily: Get on your hands and knees on a firm bed or the floor. Keep your hands under your shoulders, and keep your knees under your hips. You  may put padding under your knees. Let your head hang down toward your chest. Tighten (contract) the muscles in your belly. Point your tailbone toward the floor so your lower back becomes rounded like the back of a cat. Stay in this position for 5 seconds. Slowly lift your head. Let the muscles of your belly relax. Point your tailbone up toward the ceiling so your back forms a sagging arch like the back of a cow. Stay in this position for 5 seconds.  Press-ups Do these steps 5-10 times in a row: Lie on your belly (face-down) on a firm bed or the floor. Place your hands near your head, about shoulder-width apart. While you keep your back relaxed and keep your hips on the floor, slowly straighten your arms to raise the top half of your body and lift your shoulders. Do not use your back muscles. You may change where you place your hands to make yourself more comfortable. Stay in this position for 5 seconds. Keep your back relaxed. Slowly return to lying flat on the floor.  Bridges Do these steps 10 times in a row: Lie on your back on a firm bed or the floor. Bend your knees so they point up to the ceiling. Your feet should be flat on the floor. Your arms should be flat at your sides, next to your body. Tighten your butt muscles and lift your butt off the floor until your waist is almost as high as your knees. If you do not feel the muscles working in your butt and the back of your thighs, slide your feet 1-2 inches (2.5-5 cm) farther away from your butt. Stay in this position for 3-5 seconds. Slowly lower your butt to the floor, and let your butt muscles relax. If this exercise is too easy, try doing it with your arms crossed over your chest. Belly crunches Do these steps 5-10 times in a row: Lie on your back on a firm bed or the floor with your legs stretched out. Bend your knees so they point up to the ceiling. Your feet should be flat on the floor. Cross your arms over your chest. Tip your  chin a little bit toward your chest, but do not bend your neck. Tighten your belly muscles and slowly raise your chest just enough to lift your shoulder blades a tiny bit off the floor. Avoid raising your body higher than that because it can put too much stress on your lower back. Slowly lower your chest and your head to the  floor. Back lifts Do these steps 5-10 times in a row: Lie on your belly (face-down) with your arms at your sides, and rest your forehead on the floor. Tighten the muscles in your legs and your butt. Slowly lift your chest off the floor while you keep your hips on the floor. Keep the back of your head in line with the curve in your back. Look at the floor while you do this. Stay in this position for 3-5 seconds. Slowly lower your chest and your face to the floor. Contact a doctor if: Your back pain gets a lot worse when you do an exercise. Your back pain does not get better within 2 hours after you exercise. If you have any of these problems, stop doing the exercises. Do not do them again unless your doctor says it is okay. Get help right away if: You have sudden, very bad back pain. If this happens, stop doing the exercises. Do not do them again unless your doctor says it is okay. This information is not intended to replace advice given to you by your health care provider. Make sure you discuss any questions you have with your health care provider. Document Revised: 04/15/2020 Document Reviewed: 04/15/2020 Elsevier Patient Education  2022 [place]. Neck Exercises Ask your health care provider which exercises are safe for you. Do exercises exactly as told by your health care provider and adjust them as directed. It is normal to feel mild stretching, pulling, tightness, or discomfort as you do these exercises. Stop right away if you feel sudden pain or your pain gets worse. Do not begin these exercises until told by your health care provider. Neck exercises can be  important for many reasons. They can improve strength and maintain flexibility in your neck, which will help your upper back and prevent neck pain. Stretching exercises Rotation neck stretching  Sit in a chair or stand up. Place your feet flat on the floor, shoulder-width apart. Slowly turn your head (rotate) to the right until a slight stretch is felt. Turn it all the way to the right so you can look over your right shoulder. Do not tilt or tip your head. Hold this position for 10-30 seconds. Slowly turn your head (rotate) to the left until a slight stretch is felt. Turn it all the way to the left so you can look over your left shoulder. Do not tilt or tip your head. Hold this position for 10-30 seconds. Repeat __________ times. Complete this exercise __________ times a day. Neck retraction  Sit in a sturdy chair or stand up. Look straight ahead. Do not bend your neck. Use your fingers to push your chin backward (retraction). Do not bend your neck for this movement. Continue to face straight ahead. If you are doing the exercise properly, you will feel a slight sensation in your throat and a stretch at the back of your neck. Hold the stretch for 1-2 seconds. Repeat __________ times. Complete this exercise __________ times a day. Strengthening exercises Neck press  Lie on your back on a firm bed or on the floor with a pillow under your head. Use your neck muscles to push your head down on the pillow and straighten your spine. Hold the position as well as you can. Keep your head facing up (in a neutral position) and your chin tucked. Slowly count to 5 while holding this position. Repeat __________ times. Complete this exercise __________ times a day. Isometrics These are exercises in which you  strengthen the muscles in your neck while keeping your neck still (isometrics). Sit in a supportive chair and place your hand on your forehead. Keep your head and face facing straight ahead. Do not  flex or extend your neck while doing isometrics. Push forward with your head and neck while pushing back with your hand. Hold for 10 seconds. Do the sequence again, this time putting your hand against the back of your head. Use your head and neck to push backward against the hand pressure. Finally, do the same exercise on either side of your head, pushing sideways against the pressure of your hand. Repeat __________ times. Complete this exercise __________ times a day. Prone head lifts  Lie face-down (prone position), resting on your elbows so that your chest and upper back are raised. Start with your head facing downward, near your chest. Position your chin either on or near your chest. Slowly lift your head upward. Lift until you are looking straight ahead. Then continue lifting your head as far back as you can comfortably stretch. Hold your head up for 5 seconds. Then slowly lower it to your starting position. Repeat __________ times. Complete this exercise __________ times a day. Supine head lifts  Lie on your back (supine position), bending your knees to point to the ceiling and keeping your feet flat on the floor. Lift your head slowly off the floor, raising your chin toward your chest. Hold for 5 seconds. Repeat __________ times. Complete this exercise __________ times a day. Scapular retraction  Stand with your arms at your sides. Look straight ahead. Slowly pull both shoulders (scapulae) backward and downward (retraction) until you feel a stretch between your shoulder blades in your upper back. Hold for 10-30 seconds. Relax and repeat. Repeat __________ times. Complete this exercise __________ times a day. Contact a health care provider if: Your neck pain or discomfort gets worse when you do an exercise. Your neck pain or discomfort does not improve within 2 hours after you exercise. If you have any of these problems, stop exercising right away. Do not do the exercises again  unless your health care provider says that you can. Get help right away if: You develop sudden, severe neck pain. If this happens, stop exercising right away. Do not do the exercises again unless your health care provider says that you can. This information is not intended to replace advice given to you by your health care provider. Make sure you discuss any questions you have with your health care provider. Document Revised: 07/28/2020 Document Reviewed: 07/28/2020 Elsevier Patient Education  [place].

## 2021-03-17 LAB — DRUG MONITOR, PANEL 5, W/CONF, URINE
Amphetamines: NEGATIVE ng/mL (ref <500)
Barbiturates: NEGATIVE ng/mL (ref <300)
Benzodiazepines: NEGATIVE ng/mL (ref <100)
Cocaine Metabolite: NEGATIVE ng/mL (ref <150)
Creatinine: 68.2 mg/dL (ref >=20.0)
Marijuana Metabolite: NEGATIVE ng/mL (ref <20)
Methadone Metabolite: NEGATIVE ng/mL (ref <100)
Opiates: NEGATIVE ng/mL (ref <100)
Oxidant: NEGATIVE ug/mL (ref <200)
Oxycodone: NEGATIVE ng/mL (ref <100)
pH: 7.6 (ref 4.5–9.0)

## 2021-03-17 LAB — DM TEMPLATE

## 2021-03-17 LAB — DRUG MONITOR, TRAMADOL,QN, URINE
Desmethyltramadol: NEGATIVE ng/mL (ref <100)
Tramadol: NEGATIVE ng/mL (ref <100)

## 2021-03-18 NOTE — Progress Notes (Signed)
UDS was negative for tramadol.  She takes tramadol as needed.

## 2021-03-23 ENCOUNTER — Other Ambulatory Visit: Payer: Self-pay | Admitting: Internal Medicine

## 2021-03-26 ENCOUNTER — Other Ambulatory Visit: Payer: Self-pay | Admitting: Rheumatology

## 2021-03-26 NOTE — Telephone Encounter (Signed)
Next Visit: 09/08/2021  Last Visit: 03/16/2021  UDS:03/16/2021  Narc Agreement: 03/16/2021  Last Fill: 02/26/2020 tramadol 50 mg 1 tablet by mouth daily as needed for pain relief.  Okay to refill Tramadol?

## 2021-04-15 ENCOUNTER — Telehealth (INDEPENDENT_AMBULATORY_CARE_PROVIDER_SITE_OTHER): Payer: Medicare HMO | Admitting: Family Medicine

## 2021-04-15 ENCOUNTER — Encounter: Payer: Self-pay | Admitting: Family Medicine

## 2021-04-15 ENCOUNTER — Telehealth: Payer: Self-pay | Admitting: Internal Medicine

## 2021-04-15 DIAGNOSIS — F439 Reaction to severe stress, unspecified: Secondary | ICD-10-CM | POA: Diagnosis not present

## 2021-04-15 NOTE — Patient Instructions (Addendum)
Good luck with the move! ? ?Consider counseling as discussed. ? ?I hope you are feeling better soon! ? ?Seek in person care promptly if your symptoms worsen, new concerns arise or you are not improving with treatment. ? ?It was nice to meet you today. I help [facility] out with telemedicine visits on Tuesdays and Thursdays and am happy to help if you need a virtual follow up visit on those days. Otherwise, if you have any concerns or questions following this visit please schedule a follow up visit with your Primary Care office or seek care at a local urgent care clinic to avoid delays in care ? ?

## 2021-04-15 NOTE — Telephone Encounter (Signed)
Patient called in requesting for Dr.Panosh to send her something in for anxiety. Informed patient that Dr.Panosh isn't in the office and will be out until Monday. Patient did not want to wait so I offered her an appointment with Dr. Maudie Mercury. ? ?Patient accepted appointment offer. ? ?Please advise. ?

## 2021-04-15 NOTE — Progress Notes (Signed)
Virtual Visit via Video Note ? ?I connected with Briana Giles ? on 04/15/21 at  3:20 PM EST by a video enabled telemedicine application and verified that I am speaking with the correct person using two identifiers. ? Location patient: Briana Giles ?Location provider:work or home office ?Persons participating in the virtual visit: patient, provider ? ?I discussed the limitations and requested verbal permission for telemedicine visit. The patient expressed understanding and agreed to proceed. ? ? ?HPI: ? ?Acute telemedicine visit for stress: ?-in the process of moving to a 2nd home in [place] ?-Onset: having some stress about the move - has been feeling feeling a little on edge the last few days, they move in a few days and then she feels she will be fine ?-denies panic attack, SI, thoughts of harm, depressed thoughts, cognitive disturbances or any other symptoms ?-reports is fully able to function and it is not interfering with her thoughts ?-has done counseling in the past ?-never has been on medications for this ?-Pertinent past medical history: see below ?-Pertinent medication allergies: ?Allergies  ?Allergen Reactions  ? Codeine Phosphate Other (See Comments)  ?  REACTION: upset stomach  ? ? ?ROS: See pertinent positives and negatives per HPI. ? ?Past Medical History:  ?Diagnosis Date  ? Abdominal pain, other specified site   ? Abnormal LFTs   ? Allergic rhinitis   ? Cervical back pain with evidence of disc disease   ? GERD (gastroesophageal reflux disease)   ? Heart murmur   ? says all her life had echo in [place] year ago not to worry  ? Hypertension   ? Nerve damage   ? left arm  ? Osteoarthritis   ? Osteopenia   ? Phlebitis   ? Spinal stenosis   ? Transient global amnesia   ? ? ?Past Surgical History:  ?Procedure Laterality Date  ? BUNIONECTOMY    ? fall 2022 [facility]  ? CHOLECYSTECTOMY    ? CYST EXCISION Right 01/2018  ? right thumb   ? FOOT SURGERY    ?  x 2    ? HAND SURGERY    ? right hand  ? KNEE ARTHROSCOPY    ? right   ? REPLACEMENT UNICONDYLAR JOINT KNEE    ? rt knee  ? TONSILLECTOMY    ? TOTAL KNEE ARTHROPLASTY Left 06/2019  ? ? ? ?Current Outpatient Medications:  ?  acetaminophen (TYLENOL) 500 MG tablet, Take 1,000 mg by mouth every 6 (six) hours as needed for mild pain, moderate pain or headache., Disp: , Rfl:  ?  fluticasone (FLONASE) 50 MCG/ACT nasal spray, USE TWO SPRAYS IN EACH NOSTRIL ONCE DAILY. (Patient taking differently: as needed.), Disp: 16 g, Rfl: 5 ?  lisinopril (ZESTRIL) 20 MG tablet, TAKE 1 TABLET BY MOUTH EVERY DAY, Disp: 90 tablet, Rfl: 2 ?  polyethylene glycol powder (MIRALAX) powder, Take 17 g by mouth once. (Patient taking differently: Take 17 g by mouth as needed.), Disp: 255 g, Rfl: 0 ?  traMADol (ULTRAM) 50 MG tablet, TAKE 1 TABLET(50 MG) BY MOUTH DAILY AS NEEDED, Disp: 30 tablet, Rfl: 0 ?  traZODone (DESYREL) 50 MG tablet, TAKE 1 TABLET(50 MG) BY MOUTH AT BEDTIME AS NEEDED FOR SLEEP. MAY INCREASE TO 1 AND 1/2 TABLETS 75 MG DOSE, Disp: 30 tablet, Rfl: 1 ? ?EXAM: ? ?VITALS per patient if applicable: ? ?GENERAL: alert, oriented, appears well and in no acute distress ? ?HEENT: atraumatic, conjunttiva clear, no obvious abnormalities on inspection of external nose and ears ? ?  NECK: normal movements of the head and neck ? ?LUNGS: on inspection no signs of respiratory distress, breathing rate appears normal, no obvious gross SOB, gasping or wheezing ? ?CV: no obvious cyanosis ? ?MS: moves all visible extremities without noticeable abnormality ? ?PSYCH/NEURO: pleasant and cooperative, no obvious depression or anxiety, speech and thought processing grossly intact ? ?ASSESSMENT AND PLAN: ? ?Discussed the following assessment and plan: ? ?Stress ? ?-we discussed possible serious and likely etiologies, options for evaluation and workup, limitations of telemedicine visit vs in person visit, treatment, treatment risks and precautions. Pt is agreeable to treatment via telemedicine at this moment. Discussed various  options for counseling (online since is moving in a few days), medications and risks. After this discussion she opted not to start any medications at this time. Discussed otc products.  ?Advised to seek prompt virtual visit or in person care if worsening, new symptoms arise, or if is not improving with treatment as expected per our conversation of expected course. Discussed options for follow up care. Did let this patient know that I do telemedicine on Tuesdays and Thursdays for [facility] and those are the days I am logged into the system. Advised to schedule follow up visit with PCP, [facility] virtual visits or [facility] if any further questions or concerns to avoid delays in care. ?  ?I discussed the assessment and treatment plan with the patient. The patient was provided an opportunity to ask questions and all were answered. The patient agreed with the plan and demonstrated an understanding of the instructions. ?  ? ? ?Briana Kern, DO  ? ?

## 2021-05-04 ENCOUNTER — Other Ambulatory Visit: Payer: Self-pay | Admitting: Rheumatology

## 2021-05-04 MED ORDER — TRAMADOL HCL 50 MG PO TABS: ORAL_TABLET | ORAL | 0 refills | Status: DC

## 2021-05-04 NOTE — Telephone Encounter (Signed)
Patient called the office requesting a refill of Tramadol '50mg'$ . Patient states she would like that sent to [facility], [place] in [place] [address]. Phone # [phone] Patient states she is currently in [place].  ?

## 2021-05-04 NOTE — Telephone Encounter (Signed)
Next Visit: 09/08/2021 ?  ?Last Visit: 03/16/2021 ?  ?UDS:03/16/2021 ?  ?Narc Agreement: 03/16/2021 ?  ?Last Fill: 03/26/2021 tramadol 50 mg 1 tablet by mouth daily as needed for pain relief. ?  ?Okay to refill Tramadol?  ?

## 2021-06-22 ENCOUNTER — Other Ambulatory Visit: Payer: Self-pay | Admitting: Physician Assistant

## 2021-06-22 NOTE — Telephone Encounter (Signed)
Next Visit: 09/08/2021 ?  ?Last Visit: 03/16/2021 ?  ?UDS:03/16/2021 ?  ?Narc Agreement: 03/16/2021 ?  ?Last Fill: 05/04/2021 tramadol 50 mg 1 tablet by mouth daily as needed for pain relief. ?  ?Okay to refill Tramadol?  ?

## 2021-07-14 ENCOUNTER — Other Ambulatory Visit: Payer: Self-pay | Admitting: Internal Medicine

## 2021-07-14 NOTE — Telephone Encounter (Signed)
Last Ov 02/01/22 Filled 03/23/21 Is it ok to refill?

## 2021-07-22 ENCOUNTER — Other Ambulatory Visit: Payer: Self-pay | Admitting: Rheumatology

## 2021-07-22 MED ORDER — TRAMADOL HCL 50 MG PO TABS: 50.0000 mg | ORAL_TABLET | Freq: Every day | ORAL | 0 refills | Status: DC | PRN

## 2021-07-22 NOTE — Telephone Encounter (Signed)
Patient called the office requesting a refill of Tramadol '50mg'$  be sent to [facility] on [address].

## 2021-07-22 NOTE — Telephone Encounter (Signed)
Next Visit: 09/08/2021   Last Visit: 03/16/2021   UDS:03/16/2021   Narc Agreement: 03/16/2021   Last Fill: 06/22/2021 tramadol 50 mg 1 tablet by mouth daily as needed for pain relief.   Okay to refill Tramadol?

## 2021-08-25 NOTE — Progress Notes (Signed)
Office Visit Note  Patient: Briana Giles             Date of Birth: August 11, 1945           MRN: [id number]             PCP: Madelin Headings, MD Referring: Madelin Headings, MD Visit Date: 09/08/2021 Occupation: @GUAROCC @  Subjective:  Medication management  History of Present Illness: Briana Giles is a 76 y.o. female with history of osteoarthritis, degenerative disc disease and osteopenia.  She has been experiencing some pain and discomfort in her bilateral hands especially her right fifth PIP joint.  She also continues to have discomfort in her bilateral knee joints which are replaced.  She complains of discomfort in her toes due to underlying osteoarthritis.  She continues to have neck and lower back pain.  She takes tramadol 50 mg at bedtime on as needed basis.  She states she is unable to function sometimes without tramadol.  She takes trazodone only on as needed basis.  Activities of Daily Living:  Patient reports morning stiffness for 2-3 hours.   Patient Denies nocturnal pain.  Difficulty dressing/grooming: Denies Difficulty climbing stairs: Denies Difficulty getting out of chair: Denies Difficulty using hands for taps, buttons, cutlery, and/or writing: Denies  Review of Systems  Constitutional:  Negative for fatigue.  HENT:  Negative for mouth sores and mouth dryness.   Eyes:  Negative for dryness.  Respiratory:  Negative for shortness of breath.   Cardiovascular:  Negative for chest pain and palpitations.  Gastrointestinal:  Negative for blood in stool, constipation and diarrhea.  Endocrine: Negative for increased urination.  Genitourinary:  Negative for involuntary urination.  Musculoskeletal:  Positive for joint pain, joint pain and morning stiffness. Negative for joint swelling, myalgias, muscle weakness, muscle tenderness and myalgias.  Skin:  Negative for color change, rash, hair loss and sensitivity to sunlight.  Allergic/Immunologic: Negative for susceptible to  infections.  Neurological:  Positive for tremors. Negative for dizziness and headaches.  Hematological:  Negative for swollen glands.  Psychiatric/Behavioral:  Negative for depressed mood and sleep disturbance. The patient is not nervous/anxious.     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   Abdominal bruit 12/31/2012   Encounter for Medicare annual wellness exam 12/31/2012   Degenerative joint disease of spine multilevel 03/07/2012   Spinal stenosis, lumbar 03/07/2012   Hip pain 09/02/2011   Sciatica of left side 08/14/2011   Spinal stenosis of lumbar region 08/14/2011   Encounter for long-term (current) use of other medications 06/25/2011   High risk medication use 12/21/2010   Need for lipid screening 12/21/2010   Foot pain, left 07/22/2010   CHEST PAIN 02/18/2010   Osteopenia 07/05/2009   TREMOR, RIGHT HAND 01/26/2009   BACK PAIN 09/30/2008   NEOPLASM, SKIN, UNCERTAIN BEHAVIOR 05/27/2008   HYPERPOTASSEMIA 05/27/2008   Essential hypertension 10/12/2007   TRANSIENT GLOBAL AMNESIA 10/12/2007   VARICOSE VEINS, LOWER EXTREMITIES 06/06/2007   SYSTOLIC MURMUR 06/06/2007   ABDOMINAL BRUIT 06/06/2007   ALLERGIC RHINITIS 08/17/2006   GERD 08/17/2006  Osteoarthritis 08/17/2006   Disturbance in sleep behavior 08/17/2006    Past Medical History:  Diagnosis Date   Abdominal pain, other specified site    Abnormal LFTs    Allergic rhinitis    Cervical back pain with evidence of disc disease    GERD (gastroesophageal reflux disease)    Heart  murmur    says all her life had echo in [place] year ago not to worry   Hypertension    Nerve damage    left arm   Osteoarthritis    Osteopenia    Phlebitis    Spinal stenosis    Transient global amnesia     Family History  Problem Relation Age of Onset   Hypertension Father    Alcohol abuse Father    Lung cancer Father    Cancer Maternal Grandmother        sinus   COPD Mother    Osteoporosis Mother    Heart disease Mother    Hypertension Brother    Heart Problems Brother    Arthritis Brother    Prostate cancer Maternal Grandfather    Heart attack Brother    Hypertension Brother    Healthy Daughter    Healthy Son    Colon cancer Neg Hx    Pancreatic cancer Neg Hx    Stomach cancer Neg Hx    Past Surgical History:  Procedure Laterality Date   BUNIONECTOMY     fall 2022 [facility]   CHOLECYSTECTOMY     CYST EXCISION Right 01/2018   right thumb    FOOT SURGERY      x 2     HAND SURGERY     right hand   KNEE ARTHROSCOPY     right   REPLACEMENT UNICONDYLAR JOINT KNEE     rt knee   TONSILLECTOMY     TOTAL KNEE ARTHROPLASTY Left 06/2019   Social History   Social History Narrative   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  [place] grand child    Does water exercise aerobics walking elliptical    Immunization History  Administered Date(s) Administered   Fluad Quad(high Dose 65+) 12/14/2020   Influenza Split 12/27/2011, 10/15/2012   Influenza Whole 12/03/2007, 12/24/2009, 10/25/2010   Influenza, High Dose Seasonal PF 12/31/2013, 11/06/2014, 12/10/2016, 10/25/2017, 10/31/2018   Influenza,inj,quad, With Preservative 11/10/2017   Influenza-Unspecified 11/14/2016   PFIZER(Purple Top)SARS-COV-2 Vaccination 03/08/2019, 03/30/2019, 10/30/2019, 11/03/2020   Pfizer Covid-19 Vaccine Bivalent Booster 48yrs & up 11/03/2020   Pneumococcal Conjugate-13 01/06/2014   Pneumococcal Polysaccharide-23 12/21/2010    Td 05/27/2008   Tdap 01/06/2014   Zoster Recombinat (Shingrix) 10/26/2017, 10/31/2018   Zoster, Live 12/03/2007     Objective: Vital Signs: BP 129/77 (BP Location: Left Arm, Patient Position: Sitting, Cuff Size: Normal)   Pulse 78   Ht 5\' 1"  (1.549 m)   Wt 134 lb 6.4 oz (61 kg)   BMI 25.39 kg/m    Physical Exam Vitals and nursing note reviewed.  Constitutional:      Appearance: She is well-developed.  HENT:     Head: Normocephalic and atraumatic.  Eyes:     Conjunctiva/sclera: Conjunctivae normal.  Cardiovascular:     Rate and Rhythm: Normal rate and regular rhythm.     Heart sounds: Normal heart sounds.  Pulmonary:     Effort: Pulmonary effort is normal.     Breath sounds: Normal  breath sounds.  Abdominal:     General: Bowel sounds are normal.     Palpations: Abdomen is soft.  Musculoskeletal:     Cervical back: Normal range of motion.  Lymphadenopathy:     Cervical: No cervical adenopathy.  Skin:    General: Skin is warm and dry.     Capillary Refill: Capillary refill takes less than 2 seconds.  Neurological:     Mental Status: She is alert and oriented to person, place, and time.  Psychiatric:        Behavior: Behavior normal.      Musculoskeletal Exam: C-spine was in good range of motion.  Shoulder joints, elbow joints, wrist joints with good range of motion.  She had bilateral PIP and DIP thickening with no synovitis.  Hip joints with good range of motion.  She had bilateral knee replacements with limited extension some warmth on palpation.  She had overcrowding of her toes.  She had bilateral bunionectomy.  CDAI Exam: CDAI Score: -- Patient Global: --; Provider Global: -- Swollen: --; Tender: -- Joint Exam 09/08/2021   No joint exam has been documented for this visit   There is currently no information documented on the homunculus. Go to the Rheumatology activity and complete the homunculus joint exam.  Investigation: No additional  findings.  Imaging: No results found.  Recent Labs: Lab Results  Component Value Date   WBC 4.4 02/01/2021   HGB 14.5 02/01/2021   PLT 258.0 02/01/2021   NA 140 02/01/2021   K 5.0 02/01/2021   CL 103 02/01/2021   CO2 28 02/01/2021   GLUCOSE 87 02/01/2021   BUN 21 02/01/2021   CREATININE 0.91 02/01/2021   BILITOT 0.4 02/01/2021   ALKPHOS 103 02/01/2021   AST 25 02/01/2021   ALT 28 02/01/2021   PROT 7.7 02/01/2021   ALBUMIN 4.5 02/01/2021   CALCIUM 10.1 02/01/2021   GFRAA 68 12/15/2016    Speciality Comments: No specialty comments available.  Procedures:  No procedures performed Allergies: Codeine phosphate   Assessment / Plan:     Visit Diagnoses: Primary osteoarthritis of both hands-she has bilateral PIP and DIP thickening.  No synovitis was noted.  She has been having some discomfort in her right fifth PIP joint.  Topical agents were discussed.  Joint protection muscle strengthening was discussed.  S/P total knee arthroplasty, left-chronic pain  History of partial knee replacement - Right.  Chronic pain  Primary osteoarthritis of both feet-she has overcrowding of the toes.  Toe separator was advised.  History of bunionectomy bilateral  DDD (degenerative disc disease), cervical - X-rays of the C-spine updated on 02/01/21: Multilevel spondylosis and facet hypertrophy, greatest C4-C5 through C6-7. She has chronic discomfort.  DDD (degenerative disc disease), lumbar-has off-and-on discomfort in her lower back.  Other chronic pain - tramadol 50 mg 1 tablet by mouth daily as needed for pain relief. UDS & narcotic agreement: 03/16/2021.  Patient states that she has bought a house in [place].  She will be living in [place] for 6 months and 6 months here.  She will establish with a rheumatologist or PCP in [place].  Medication monitoring encounter-we will check UDS today.  Osteopenia of multiple sites - Most recent DEXA was in 2016.  She is overdue to update DEXA.   Reminded patient to get repeat DEXA scan.  Other medical problems are listed as follows:  Family history of rheumatoid arthritis  History of varicose veins  Abnormal serum angiotensin-converting enzyme level  History of  hypertension  History of gastroesophageal reflux (GERD)  Orders: Orders Placed This Encounter  Procedures   DRUG MONITOR, TRAMADOL,QN, URINE   DRUG MONITOR, PANEL 5, W/CONF, URINE   Meds ordered this encounter  Medications   traMADol (ULTRAM) 50 MG tablet    Sig: Take 1 tablet (50 mg total) by mouth daily as needed.    Dispense:  30 tablet    Refill:  0    Follow-Up Instructions: Return in about 6 months (around 03/11/2022) for Osteoarthritis.   Pollyann Savoy, MD  Note - This record has been created using Animal nutritionist.  Chart creation errors have been sought, but may not always  have been located. Such creation errors do not reflect on  the standard of medical care.

## 2021-09-08 ENCOUNTER — Ambulatory Visit (INDEPENDENT_AMBULATORY_CARE_PROVIDER_SITE_OTHER): Payer: Medicare HMO | Admitting: Rheumatology

## 2021-09-08 ENCOUNTER — Encounter: Payer: Self-pay | Admitting: Rheumatology

## 2021-09-08 VITALS — BP 129/77 | HR 78 | Ht 61.0 in | Wt 134.4 lb

## 2021-09-08 DIAGNOSIS — Z96652 Presence of left artificial knee joint: Secondary | ICD-10-CM | POA: Diagnosis not present

## 2021-09-08 DIAGNOSIS — M19041 Primary osteoarthritis, right hand: Secondary | ICD-10-CM

## 2021-09-08 DIAGNOSIS — Z9889 Other specified postprocedural states: Secondary | ICD-10-CM

## 2021-09-08 DIAGNOSIS — Z8719 Personal history of other diseases of the digestive system: Secondary | ICD-10-CM

## 2021-09-08 DIAGNOSIS — M19042 Primary osteoarthritis, left hand: Secondary | ICD-10-CM

## 2021-09-08 DIAGNOSIS — M5030 Other cervical disc degeneration, unspecified cervical region: Secondary | ICD-10-CM

## 2021-09-08 DIAGNOSIS — Z8679 Personal history of other diseases of the circulatory system: Secondary | ICD-10-CM

## 2021-09-08 DIAGNOSIS — G8929 Other chronic pain: Secondary | ICD-10-CM

## 2021-09-08 DIAGNOSIS — R748 Abnormal levels of other serum enzymes: Secondary | ICD-10-CM

## 2021-09-08 DIAGNOSIS — Z8261 Family history of arthritis: Secondary | ICD-10-CM

## 2021-09-08 DIAGNOSIS — Z96659 Presence of unspecified artificial knee joint: Secondary | ICD-10-CM | POA: Diagnosis not present

## 2021-09-08 DIAGNOSIS — Z5181 Encounter for therapeutic drug level monitoring: Secondary | ICD-10-CM

## 2021-09-08 DIAGNOSIS — M8589 Other specified disorders of bone density and structure, multiple sites: Secondary | ICD-10-CM

## 2021-09-08 DIAGNOSIS — M19071 Primary osteoarthritis, right ankle and foot: Secondary | ICD-10-CM | POA: Diagnosis not present

## 2021-09-08 DIAGNOSIS — M19072 Primary osteoarthritis, left ankle and foot: Secondary | ICD-10-CM

## 2021-09-08 DIAGNOSIS — M5136 Other intervertebral disc degeneration, lumbar region: Secondary | ICD-10-CM

## 2021-09-08 MED ORDER — TRAMADOL HCL 50 MG PO TABS
50.0000 mg | ORAL_TABLET | Freq: Every day | ORAL | 0 refills | Status: DC | PRN
Start: 2021-09-08 — End: 2021-10-06

## 2021-09-10 LAB — DRUG MONITOR, PANEL 5, W/CONF, URINE
Amphetamines: NEGATIVE ng/mL (ref <500)
Barbiturates: NEGATIVE ng/mL (ref <300)
Benzodiazepines: NEGATIVE ng/mL (ref <100)
Cocaine Metabolite: NEGATIVE ng/mL (ref <150)
Creatinine: 109.8 mg/dL (ref >=20.0)
Marijuana Metabolite: NEGATIVE ng/mL (ref <20)
Methadone Metabolite: NEGATIVE ng/mL (ref <100)
Opiates: NEGATIVE ng/mL (ref <100)
Oxidant: NEGATIVE ug/mL (ref <200)
Oxycodone: NEGATIVE ng/mL (ref <100)
pH: 5.6 (ref 4.5–9.0)

## 2021-09-10 LAB — DRUG MONITOR, TRAMADOL,QN, URINE
Desmethyltramadol: 3632 ng/mL — ABNORMAL HIGH (ref <100)
Tramadol: >10000 ng/mL — ABNORMAL HIGH (ref <100)

## 2021-09-10 LAB — DM TEMPLATE

## 2021-09-10 NOTE — Progress Notes (Signed)
UDS is consistent with tramadol use.

## 2021-10-06 ENCOUNTER — Other Ambulatory Visit: Payer: Self-pay | Admitting: Rheumatology

## 2021-10-06 NOTE — Telephone Encounter (Signed)
Per last office note patient due to be seen in January 2024. Patient is scheduled for May 2024. Please call patient to schedule for January 2024. Thanks!

## 2021-10-06 NOTE — Telephone Encounter (Signed)
Reached out to patient to schedule sooner follow up appointment. Patient states she lives in [place] part of the year and wont be back in the state until that time. Patient would like virtual if possible around January. Please advise.

## 2021-10-06 NOTE — Telephone Encounter (Signed)
Next Visit: 06/29/2022  Last Visit: 09/08/2021  UDS:09/08/2021  Narc Agreement: 03/16/2021  Last Fill: 09/08/2021 tramadol 50 mg 1 tablet by mouth daily as needed for pain relief  Okay to refill Tramadol?

## 2021-11-11 ENCOUNTER — Ambulatory Visit (INDEPENDENT_AMBULATORY_CARE_PROVIDER_SITE_OTHER): Payer: Medicare HMO

## 2021-11-11 VITALS — Ht 61.0 in | Wt 134.0 lb

## 2021-11-11 DIAGNOSIS — Z0000 Encounter for general adult medical examination without abnormal findings: Secondary | ICD-10-CM | POA: Diagnosis not present

## 2021-11-11 NOTE — Progress Notes (Signed)
Subjective:   Briana Giles is a 76 y.o. female who presents for Medicare Annual (Subsequent) preventive examination.  Review of Systems    Virtual Visit via Telephone Note  I connected with  Stefhanie Kachmar Rahrig on 11/11/21 at  1:15 PM EDT by telephone and verified that I am speaking with the correct person using two identifiers.  Location: Patient: Home Provider: Office Persons participating in the virtual visit: patient/Nurse Health Advisor   I discussed the limitations, risks, security and privacy concerns of performing an evaluation and management service by telephone and the availability of in person appointments. The patient expressed understanding and agreed to proceed.  Interactive audio and video telecommunications were attempted between this nurse and patient, however failed, due to patient having technical difficulties OR patient did not have access to video capability.  We continued and completed visit with audio only.  Some vital signs may be absent or patient reported.   Criselda Peaches, LPN  Cardiac Risk Factors include: advanced age (>78mn, >>17women);hypertension     Objective:    Today's Vitals   11/11/21 1321  Weight: 134 lb (60.8 kg)  Height: '5\' 1"'$  (1.549 m)   Body mass index is 25.32 kg/m.     11/11/2021    1:31 PM 11/10/2020    2:43 PM 10/09/2019    2:57 PM 05/02/2017    4:04 PM 08/28/2014    9:36 PM  Advanced Directives  Does Patient Have a Medical Advance Directive? Yes Yes Yes Yes Yes  Type of [place] Healthcare Power of Attorney  Living will  Copy of [place] Chart? No - copy requested No - copy requested No - copy requested  Yes    Current Medications (verified) Outpatient Encounter Medications as of 11/11/2021  Medication Sig   acetaminophen (TYLENOL) 500 MG tablet Take 1,000 mg by mouth every 6 (six) hours as needed for mild pain,  moderate pain or headache.   fluticasone (FLONASE) 50 MCG/ACT nasal spray USE TWO SPRAYS IN EACH NOSTRIL ONCE DAILY.   lisinopril (ZESTRIL) 20 MG tablet TAKE 1 TABLET BY MOUTH EVERY DAY   polyethylene glycol powder (MIRALAX) powder Take 17 g by mouth once. (Patient taking differently: Take 17 g by mouth as needed.)   traMADol (ULTRAM) 50 MG tablet TAKE 1 TABLET(50 MG) BY MOUTH DAILY AS NEEDED   traZODone (DESYREL) 50 MG tablet TAKE 1 TABLET(50 MG) BY MOUTH AT BEDTIME AS NEEDED FOR SLEEP. MAY INCREASE TO 1 AND 1/2 TABLETS 75 MG DOSE   No facility-administered encounter medications on file as of 11/11/2021.    Allergies (verified) Codeine phosphate   History: Past Medical History:  Diagnosis Date   Abdominal pain, other specified site    Abnormal LFTs    Allergic rhinitis    Cervical back pain with evidence of disc disease    GERD (gastroesophageal reflux disease)    Heart murmur    says all her life had [place] in [place] year ago not to worry   Hypertension    Nerve damage    left arm   Osteoarthritis    Osteopenia    Phlebitis    Spinal stenosis    Transient global amnesia    Past Surgical History:  Procedure Laterality Date   BUNIONECTOMY     fall 2022 Regal   CHOLECYSTECTOMY     CYST EXCISION Right 01/2018   right thumb  FOOT SURGERY      x 2     HAND SURGERY     right hand   KNEE ARTHROSCOPY     right   REPLACEMENT UNICONDYLAR JOINT KNEE     rt knee   TONSILLECTOMY     TOTAL KNEE ARTHROPLASTY Left 06/2019   Family History  Problem Relation Age of Onset   Hypertension Father    Alcohol abuse Father    Lung cancer Father    Cancer Maternal Grandmother        sinus   COPD Mother    Osteoporosis Mother    Heart disease Mother    Hypertension Brother    Heart Problems Brother    Arthritis Brother    Prostate cancer Maternal Grandfather    Heart attack Brother    Hypertension Brother    Healthy Daughter    Healthy Son    Colon cancer Neg Hx     Pancreatic cancer Neg Hx    Stomach cancer Neg Hx    Social History   Socioeconomic History   Marital status: Married    Spouse name: Not on file   Number of children: 2   Years of education: Not on file   Highest education level: Not on file  Occupational History   Occupation: retired Product manager: RETIRED  Tobacco Use   Smoking status: Never    Passive exposure: Never   Smokeless tobacco: Never  Vaping Use   Vaping Use: Never used  Substance and Sexual Activity   Alcohol use: Yes    Comment: occasional    Drug use: Never   Sexual activity: Not on file  Other Topics Concern   Not on file  Social History Narrative   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  [place] grand child    Does water exercise aerobics walking elliptical    Social Determinants of Health   Financial Resource Strain: Low Risk  (11/11/2021)   Overall Financial Resource Strain (CARDIA)    Difficulty of Paying Living Expenses: Not hard at all  Food Insecurity: No Food Insecurity (11/11/2021)   Hunger Vital Sign    Worried About Running Out of Food in the Last Year: Never true    Ran Out of Food in the Last Year: Never true  Transportation Needs: No Transportation Needs (11/11/2021)   PRAPARE - Hydrologist (Medical): No    Lack of Transportation (Non-Medical): No  Physical Activity: Sufficiently Active (11/11/2021)   Exercise Vital Sign    Days of Exercise per Week: 4 days    Minutes of Exercise per Session: 60 min  Stress: No Stress Concern Present (11/11/2021)   [place]    Feeling of Stress : Not at all  Social Connections: [place] (11/11/2021)   Social Connection and Isolation Panel [NHANES]    Frequency of Communication with Friends and Family: More than three times a week    Frequency of Social Gatherings with  Friends and Family: More than three times a week    Attends Religious Services: More than 4 times per year    Active Member of [employer] or Organizations: Yes    Attends Music therapist: More than 4 times per year    Marital Status: Married    Tobacco Counseling Counseling given:  Not Answered   Clinical Intake:  Pre-visit preparation completed: No  Pain : No/denies pain     BMI - recorded: 25.32 Nutritional Status: BMI 25 -29 Overweight Nutritional Risks: None Diabetes: No  How often do you need to have someone help you when you read instructions, pamphlets, or other written materials from your doctor or pharmacy?: 1 - Never  Diabetic?  No  Interpreter Needed?: No  Information entered by :: Rolene Arbour LPN   Activities of Daily Living    11/11/2021    1:29 PM  In your present state of health, do you have any difficulty performing the following activities:  Hearing? 0  Vision? 0  Difficulty concentrating or making decisions? 0  Walking or climbing stairs? 0  Dressing or bathing? 0  Doing errands, shopping? 0  Preparing Food and eating ? N  Using the Toilet? N  In the past six months, have you accidently leaked urine? N  Do you have problems with loss of bowel control? N  Managing your Medications? N  Managing your Finances? N  Housekeeping or managing your Housekeeping? N    Patient Care Team: Panosh, Standley Brooking, MD as PCP - General Regal, Tamala Fothergill, DPM (Podiatry) Bo Merino, MD (Rheumatology) Milus Banister, MD (Gastroenterology) Jari Pigg, MD as Attending Physician (Dermatology) Laurance Flatten, MD as Referring Physician (Orthopedic Surgery)  Indicate any recent Medical Services you may have received from other than [facility] providers in the past year (date may be approximate).     Assessment:   This is a routine wellness examination for [facility].  Hearing/Vision screen Hearing Screening - Comments:: Denies hearing difficulties   Vision  Screening - Comments:: Wears rx glasses - up to date with routine eye exams with  Patient deferred  Dietary issues and exercise activities discussed: Current Exercise Habits: Home exercise routine, Type of exercise: walking, Time (Minutes): 60, Frequency (Times/Week): 4, Weekly Exercise (Minutes/Week): 240, Intensity: Moderate, Exercise limited by: None identified   Goals Addressed               This Visit's Progress     Stay Healthy (pt-stated)         Depression Screen    11/11/2021    1:27 PM 02/01/2021   11:04 AM 11/10/2020    2:45 PM 11/10/2020    2:42 PM 10/09/2019    2:55 PM 12/05/2018    3:19 PM 05/02/2017    4:07 PM  PHQ 2/9 Scores  PHQ - 2 Score 0 0 0 0 0 0 0  PHQ- 9 Score  0         Fall Risk    11/11/2021    1:29 PM 02/01/2021   11:04 AM 11/10/2020    2:45 PM 10/09/2019    2:59 PM 01/02/2019    2:44 PM  Bent in the past year? 0 1 0 0 0  Number falls in past yr: 0 0 0 0 0  Injury with Fall? 0  0 0 0  Risk for fall due to : No Fall Risks   Impaired mobility;Impaired vision   Risk for fall due to: Comment    related to knee pain at times   Follow up Falls prevention discussed  Falls evaluation completed Falls prevention discussed Falls evaluation completed    [place]:  Any stairs in or around the home? Yes  If so, are there any without handrails? No  Home free of  loose throw rugs in walkways, pet beds, electrical cords, etc? Yes  Adequate lighting in your home to reduce risk of falls? Yes   ASSISTIVE DEVICES UTILIZED TO PREVENT FALLS:  Life alert? No  Use of a cane, walker or w/c? No  Grab bars in the bathroom? No  Shower chair or bench in shower? No  Elevated toilet seat or a handicapped toilet? No   TIMED UP AND GO:  Was the test performed? No . Audio Visit   Cognitive Function:        11/11/2021    1:31 PM 10/09/2019    3:03 PM  6CIT Screen  What Year? 0 points 0 points  What month? 0  points 0 points  What time? 0 points   Count back from 20 0 points 0 points  Months in reverse 0 points 0 points  Repeat phrase 0 points 0 points  Total Score 0 points     Immunizations Immunization History  Administered Date(s) Administered   Fluad Quad(high Dose 65+) 12/14/2020   Influenza Split 12/27/2011, 10/15/2012   Influenza Whole 12/03/2007, 12/24/2009, 10/25/2010   Influenza, High Dose Seasonal PF 12/31/2013, 11/06/2014, 12/10/2016, 10/25/2017, 10/31/2018   Influenza,inj,quad, With Preservative 11/10/2017   Influenza-Unspecified 11/14/2016   PFIZER(Purple Top)SARS-COV-2 Vaccination 03/08/2019, 03/30/2019, 10/30/2019, 11/03/2020   Pfizer Covid-19 Vaccine Bivalent Booster 32yr & up 11/03/2020   Pneumococcal Conjugate-13 01/06/2014   Pneumococcal Polysaccharide-23 12/21/2010   Td 05/27/2008   Tdap 01/06/2014   Zoster Recombinat (Shingrix) 10/26/2017, 10/31/2018   Zoster, Live 12/03/2007    TDAP status: Up to date  Flu Vaccine status: Up to date  Pneumococcal vaccine status: Up to date  Covid-19 vaccine status: Completed vaccines  Qualifies for Shingles Vaccine? Yes   Zostavax completed Yes   Shingrix Completed?: Yes  Screening Tests Health Maintenance  Topic Date Due   COVID-19 Vaccine (5 - Pfizer risk series) 11/27/2021 (Originally 12/29/2020)   INFLUENZA VACCINE  05/15/2022 (Originally 09/14/2021)   TETANUS/TDAP  01/07/2024   Pneumonia Vaccine 77 Years old  Completed   DEXA SCAN  Completed   Hepatitis C Screening  Completed   Zoster Vaccines- Shingrix  Completed   HPV VACCINES  Aged Out   COLONOSCOPY (Pts 45-472yrInsurance coverage will need to be confirmed)  Discontinued    Health Maintenance  There are no preventive care reminders to display for this patient.   Colorectal cancer screening: No longer required.   Mammogram status: No longer required due to Age.  Bone Density status: Completed 02/11/15. Results reflect: Bone density results:  OSTEOPOROSIS. Repeat every   years.  Lung Cancer Screening: (Low Dose CT Chest recommended if Age 189-80ears, 30 pack-year currently smoking OR have quit w/in 15years.) does not qualify.     Additional Screening:  Hepatitis C Screening: does qualify; Completed 10/31/18  Vision Screening: Recommended annual ophthalmology exams for early detection of glaucoma and other disorders of the eye. Is the patient up to date with their annual eye exam?  Yes  Who is the provider or what is the name of the office in which the patient attends annual eye exams? Patient deferred If pt is not established with a provider, would they like to be referred to a provider to establish care? No .   Dental Screening: Recommended annual dental exams for proper oral hygiene  Community Resource Referral / Chronic Care Management:  CRR required this visit?  No   CCM required this visit?  No      Plan:  I have personally reviewed and noted the following in the patient's chart:   Medical and social history Use of alcohol, tobacco or illicit drugs  Current medications and supplements including opioid prescriptions. Patient is not currently taking opioid prescriptions. Functional ability and status Nutritional status Physical activity Advanced directives List of other physicians Hospitalizations, surgeries, and ER visits in previous 12 months Vitals Screenings to include cognitive, depression, and falls Referrals and appointments  In addition, I have reviewed and discussed with patient certain preventive protocols, quality metrics, and best practice recommendations. A written personalized care plan for preventive services as well as general preventive health recommendations were provided to patient.     Criselda Peaches, LPN   08/02/120   Nurse Notes: None

## 2021-11-11 NOTE — Patient Instructions (Addendum)
[name] , Thank you for taking time to come for your Medicare Wellness Visit. I appreciate your ongoing commitment to your health goals. Please review the following plan we discussed and let me know if I can assist you in the future.   These are the goals we discussed:  Goals       Patient Stated      Try to get 10000 every day       Patient Stated      Would like to get back to doing yoga again       Stay Healthy (pt-stated)        This is a list of the screening recommended for you and due dates:  Health Maintenance  Topic Date Due   COVID-19 Vaccine (5 - Pfizer risk series) 11/27/2021*   Flu Shot  05/15/2022*   Tetanus Vaccine  01/07/2024   Pneumonia Vaccine  Completed   DEXA scan (bone density measurement)  Completed   Hepatitis C Screening: USPSTF Recommendation to screen - Ages 87-79 yo.  Completed   Zoster (Shingles) Vaccine  Completed   HPV Vaccine  Aged Out   Colon Cancer Screening  Discontinued  *Topic was postponed. The date shown is not the original due date.    Advanced directives: Please bring a copy of your health care power of attorney and living will to the office to be added to your chart at your convenience.   Conditions/risks identified: None  Next appointment: Follow up in one year for your annual wellness visit    Preventive Care 65 Years and Older, Female Preventive care refers to lifestyle choices and visits with your health care provider that can promote health and wellness. What does preventive care include? A yearly physical exam. This is also called an annual well check. Dental exams once or twice a year. Routine eye exams. Ask your health care provider how often you should have your eyes checked. Personal lifestyle choices, including: Daily care of your teeth and gums. Regular physical activity. Eating a healthy diet. Avoiding tobacco and drug use. Limiting alcohol use. Practicing safe sex. Taking low-dose aspirin every day. Taking  vitamin and mineral supplements as recommended by your health care provider. What happens during an annual well check? The services and screenings done by your health care provider during your annual well check will depend on your age, overall health, lifestyle risk factors, and family history of disease. Counseling  Your health care provider may ask you questions about your: Alcohol use. Tobacco use. Drug use. Emotional well-being. Home and relationship well-being. Sexual activity. Eating habits. History of falls. Memory and ability to understand (cognition). Work and work Statistician. Reproductive health. Screening  You may have the following tests or measurements: Height, weight, and BMI. Blood pressure. Lipid and cholesterol levels. These may be checked every 5 years, or more frequently if you are over 83 years old. Skin check. Lung cancer screening. You may have this screening every year starting at age 92 if you have a 30-pack-year history of smoking and currently smoke or have quit within the past 15 years. Fecal occult blood test (FOBT) of the stool. You may have this test every year starting at age 2. Flexible sigmoidoscopy or colonoscopy. You may have a sigmoidoscopy every 5 years or a colonoscopy every 10 years starting at age 1. Hepatitis C blood test. Hepatitis B blood test. Sexually transmitted disease (STD) testing. Diabetes screening. This is done by checking your blood sugar (glucose) after you  have not eaten for a while (fasting). You may have this done every 1-3 years. Bone density scan. This is done to screen for osteoporosis. You may have this done starting at age 14. Mammogram. This may be done every 1-2 years. Talk to your health care provider about how often you should have regular mammograms. Talk with your health care provider about your test results, treatment options, and if necessary, the need for more tests. Vaccines  Your health care provider may  recommend certain vaccines, such as: Influenza vaccine. This is recommended every year. Tetanus, diphtheria, and acellular pertussis (Tdap, Td) vaccine. You may need a Td booster every 10 years. Zoster vaccine. You may need this after age 79. Pneumococcal 13-valent conjugate (PCV13) vaccine. One dose is recommended after age 85. Pneumococcal polysaccharide (PPSV23) vaccine. One dose is recommended after age 37. Talk to your health care provider about which screenings and vaccines you need and how often you need them. This information is not intended to replace advice given to you by your health care provider. Make sure you discuss any questions you have with your health care provider. Document Released: 02/27/2015 Document Revised: 10/21/2015 Document Reviewed: 12/02/2014 Elsevier Interactive Patient Education  2017 [employer] Prevention in the Home Falls can cause injuries. They can happen to people of all ages. There are many things you can do to make your home safe and to help prevent falls. What can I do on the outside of my home? Regularly fix the edges of walkways and driveways and fix any cracks. Remove anything that might make you trip as you walk through a door, such as a raised step or threshold. Trim any bushes or trees on the path to your home. Use bright outdoor lighting. Clear any walking paths of anything that might make someone trip, such as rocks or tools. Regularly check to see if handrails are loose or broken. Make sure that both sides of any steps have handrails. Any raised decks and porches should have guardrails on the edges. Have any leaves, snow, or ice cleared regularly. Use sand or salt on walking paths during winter. Clean up any spills in your garage right away. This includes oil or grease spills. What can I do in the bathroom? Use night lights. Install grab bars by the toilet and in the tub and shower. Do not use towel bars as grab bars. Use non-skid  mats or decals in the tub or shower. If you need to sit down in the shower, use a plastic, non-slip stool. Keep the floor dry. Clean up any water that spills on the floor as soon as it happens. Remove soap buildup in the tub or shower regularly. Attach bath mats securely with double-sided non-slip rug tape. Do not have throw rugs and other things on the floor that can make you trip. What can I do in the bedroom? Use night lights. Make sure that you have a light by your bed that is easy to reach. Do not use any sheets or blankets that are too big for your bed. They should not hang down onto the floor. Have a firm chair that has side arms. You can use this for support while you get dressed. Do not have throw rugs and other things on the floor that can make you trip. What can I do in the kitchen? Clean up any spills right away. Avoid walking on wet floors. Keep items that you use a lot in easy-to-reach places. If you need  to reach something above you, use a strong step stool that has a grab bar. Keep electrical cords out of the way. Do not use floor polish or wax that makes floors slippery. If you must use wax, use non-skid floor wax. Do not have throw rugs and other things on the floor that can make you trip. What can I do with my stairs? Do not leave any items on the stairs. Make sure that there are handrails on both sides of the stairs and use them. Fix handrails that are broken or loose. Make sure that handrails are as long as the stairways. Check any carpeting to make sure that it is firmly attached to the stairs. Fix any carpet that is loose or worn. Avoid having throw rugs at the top or bottom of the stairs. If you do have throw rugs, attach them to the floor with carpet tape. Make sure that you have a light switch at the top of the stairs and the bottom of the stairs. If you do not have them, ask someone to add them for you. What else can I do to help prevent falls? Wear shoes  that: Do not have high heels. Have rubber bottoms. Are comfortable and fit you well. Are closed at the toe. Do not wear sandals. If you use a stepladder: Make sure that it is fully opened. Do not climb a closed stepladder. Make sure that both sides of the stepladder are locked into place. Ask someone to hold it for you, if possible. Clearly mark and make sure that you can see: Any grab bars or handrails. First and last steps. Where the edge of each step is. Use tools that help you move around (mobility aids) if they are needed. These include: Canes. Walkers. Scooters. Crutches. Turn on the lights when you go into a dark area. Replace any light bulbs as soon as they burn out. Set up your furniture so you have a clear path. Avoid moving your furniture around. If any of your floors are uneven, fix them. If there are any pets around you, be aware of where they are. Review your medicines with your doctor. Some medicines can make you feel dizzy. This can increase your chance of falling. Ask your doctor what other things that you can do to help prevent falls. This information is not intended to replace advice given to you by your health care provider. Make sure you discuss any questions you have with your health care provider. Document Released: 11/27/2008 Document Revised: 07/09/2015 Document Reviewed: 03/07/2014 Elsevier Interactive Patient Education  2017 [employer].

## 2021-11-18 ENCOUNTER — Telehealth: Payer: Self-pay

## 2021-11-18 MED ORDER — TRAMADOL HCL 50 MG PO TABS: ORAL_TABLET | ORAL | 0 refills | Status: DC

## 2021-11-18 NOTE — Telephone Encounter (Signed)
Per phone note, 10/06/2021, per Dr. Estanislado Pandy patient will need appointment January 2024. Ok to schedule virtual. Please contact patient to schedule.

## 2021-11-18 NOTE — Telephone Encounter (Signed)
Patient contacted the office requesting a refill on tramadol. Patient states pharmacy was supposed to contact us but did not.   Next Visit: 06/29/2022  Last Visit: 09/08/2021  UDS:09/08/2021 UDS is consistent with tramadol use.  Narc Agreement: 04/15/2021  Last Fill: 10/06/2021 tramadol 50 mg 1 tablet by mouth daily as needed for pain relief  Okay to refill tramadol?

## 2021-11-22 NOTE — Telephone Encounter (Signed)
Called patient to schedule virtual appointment in January.  Patient states "if Dr. Estanislado Pandy thinks it is absolutely necessary for a virtual appointment" she will schedule one.  Patient states if labwork is needed she can have the orders sent to a local lab.  Please advise.

## 2021-11-22 NOTE — Telephone Encounter (Signed)
Patient states she will come by the office in December before she leaves for [place] and pick up labwork orders she can take with her and fill out narcotic agreement.

## 2021-11-26 ENCOUNTER — Other Ambulatory Visit: Payer: Self-pay | Admitting: Internal Medicine

## 2021-12-01 ENCOUNTER — Encounter: Payer: Self-pay | Admitting: Family Medicine

## 2021-12-01 ENCOUNTER — Telehealth: Payer: Medicare HMO | Admitting: Family Medicine

## 2021-12-01 ENCOUNTER — Telehealth (INDEPENDENT_AMBULATORY_CARE_PROVIDER_SITE_OTHER): Payer: Medicare HMO | Admitting: Family Medicine

## 2021-12-01 VITALS — Temp 98.4°F

## 2021-12-01 DIAGNOSIS — U071 COVID-19: Secondary | ICD-10-CM | POA: Diagnosis not present

## 2021-12-01 MED ORDER — HYDROCODONE BIT-HOMATROP MBR 5-1.5 MG/5ML PO SOLN: 5.0000 mL | ORAL | 0 refills | Status: DC | PRN

## 2021-12-01 NOTE — Progress Notes (Signed)
Subjective:    Patient ID: Briana Giles, female    DOB: Jul 10, 1945, 76 y.o.   MRN: [id number]  HPI Virtual Visit via Telephone Note  I connected with the patient on 12/01/21 at  1:45 PM EDT by telephone and verified that I am speaking with the correct person using two identifiers.   I discussed the limitations, risks, security and privacy concerns of performing an evaluation and management service by telephone and the availability of in person appointments. I also discussed with the patient that there may be a patient responsible charge related to this service. The patient expressed understanding and agreed to proceed.  Location patient: home Location provider: work or home office Participants present for the call: patient, provider Patient did not have a visit in the prior 7 days to address this/these issue(s).   History of Present Illness: Here for 5 days of a dry cough. On 11-06-21 she was having body aches and a cough, and she tested positive for Covid-19 at an urgent care. She was given 5 days of Molnupiravir, and she seemed to get over it. A week after that she felt fine and she tested negative at home. Then she developed a dry cough, and she saw urgent care again yesterday where she tested positive again for the Covid virus. She was given Benzonatate pills for the cough, but these are not helping much. She denies any fever, sinus pressure, ST, SOB, or NVD. Drinking fluids.    Observations/Objective: Patient sounds cheerful and well on the phone. I do not appreciate any SOB. Speech and thought processing are grossly intact. Patient reported vitals:  Assessment and Plan: She seems to have had a rebound of the Covid virus after treatment with the antiviral medication. She will rest and drink fluids. We will send her some Hycodan to use for the cough. She will get back ini touch with [place] if she is not feeling better by next week.  Gershon Crane, MD   Follow Up  Instructions:     [phone] 11-20 [phone] 21-30 I did not refer this patient for an OV in the next 24 hours for this/these issue(s).  I discussed the assessment and treatment plan with the patient. The patient was provided an opportunity to ask questions and all were answered. The patient agreed with the plan and demonstrated an understanding of the instructions.   The patient was advised to call back or seek an in-person evaluation if the symptoms worsen or if the condition fails to improve as anticipated.  I provided 18 minutes of non-face-to-face time during this encounter.   Gershon Crane, MD     Review of Systems     Objective:   Physical Exam        Assessment & Plan:

## 2021-12-16 ENCOUNTER — Encounter: Payer: Self-pay | Admitting: Family Medicine

## 2021-12-16 ENCOUNTER — Ambulatory Visit (INDEPENDENT_AMBULATORY_CARE_PROVIDER_SITE_OTHER): Payer: Medicare HMO | Admitting: Family Medicine

## 2021-12-16 VITALS — BP 124/86 | HR 80 | Temp 98.6°F | Wt 134.2 lb

## 2021-12-16 DIAGNOSIS — J189 Pneumonia, unspecified organism: Secondary | ICD-10-CM

## 2021-12-16 NOTE — Patient Instructions (Signed)
Complete your current course of antibiotics.  You can take over-the-counter cough and cold medications, use steam from shower to help with the feeling of congestion, plenty of rest, drink warm fluids, etc. to help with your symptoms.  If you feel like your symptoms are getting worse such as developing fever, chills, worsening cough these would be reasons to be reevaluated.

## 2021-12-16 NOTE — Progress Notes (Signed)
Subjective:    Patient ID: Briana Giles, female    DOB: Dec 08, 1945, 76 y.o.   MRN: [id number]  Chief Complaint  Patient presents with   Cough    Went to UC was given augmentin and zithromax.completed the zithromax, still taking the augmentin.states the cough is less intense, feels a little more energetic  Pt accompanied by her husband  HPI Patient was seen today for follow-up on ongoing.  Pt at [facility] on 11/30/2021 for cough.  She was ultimately diagnosed with COVID-19 and started on molnupiravir.  Patient seen virtually 12/01/2021 for continued cough by Dr. Clent Ridges, given Hycodan cough syrup.  Patient seen again at [facility] Saturday, 12/11/2021 and diagnosed with L upper lobe CAP.  Started azithromycin and amoxicillin.  Patient endorses continued cough but overall energy mildly improved.  Patient inquires if there is anything she needs to do to help symptoms and if she should travel to [facility] with friends this weekend.  Also has plans to go to [place] for 3 months at the end of November.  Patient denies fever, chills, headaches, nausea, vomiting.  Appetite slightly decreased but able to stay hydrated.  Past Medical History:  Diagnosis Date   Abdominal pain, other specified site    Abnormal LFTs    Allergic rhinitis    Cervical back pain with evidence of disc disease    GERD (gastroesophageal reflux disease)    Heart murmur    says all her life had [place] in [place] year ago not to worry   Hypertension    Nerve damage    left arm   Osteoarthritis    Osteopenia    Phlebitis    Spinal stenosis    Transient global amnesia     Allergies  Allergen Reactions   Codeine Phosphate Other (See Comments)    REACTION: upset stomach    ROS General: Denies fever, chills, night sweats, changes in weight, changes in appetite + fatigue HEENT: Denies headaches, ear pain, changes in vision, rhinorrhea, sore throat CV: Denies CP, palpitations, SOB, orthopnea Pulm: Denies SOB, cough, wheezing +  cough GI: Denies abdominal pain, nausea, vomiting, diarrhea, constipation GU: Denies dysuria, hematuria, frequency, vaginal discharge Msk: Denies muscle cramps, joint pains Neuro: Denies weakness, numbness, tingling Skin: Denies rashes, bruising Psych: Denies depression, anxiety, hallucinations     Objective:    Blood pressure 124/86, pulse 80, temperature 98.6 F (37 C), temperature source Oral, weight 134 lb 3.2 oz (60.9 kg), SpO2 98 %.  Gen. Pleasant, well-nourished, in no distress, normal affect   HEENT: [place]/AT, face symmetric, conjunctiva clear, no scleral icterus, PERRLA, EOMI, nares patent without drainage, pharynx without erythema or exudate. Neck: No JVD, no thyromegaly, no carotid bruits Lungs: Intermittent dry sounding cough.  Decreased breath sounds in left lower lung.  Right lung clear.  No wheezes, Rales, rhonchi Cardiovascular: RRR, no rubs, gallops.  2/6 murmur best heard at right upper sternal border, no peripheral edema Neuro:  A&Ox3, CN II-XII intact, normal gait  Wt Readings from Last 3 Encounters:  12/16/21 134 lb 3.2 oz (60.9 kg)  11/11/21 134 lb (60.8 kg)  09/08/21 134 lb 6.4 oz (61 kg)    Lab Results  Component Value Date   WBC 4.4 02/01/2021   HGB 14.5 02/01/2021   HCT 44.2 02/01/2021   PLT 258.0 02/01/2021   GLUCOSE 87 02/01/2021   CHOL 226 (H) 02/01/2021   TRIG 90.0 02/01/2021   HDL 69.40 02/01/2021   LDLDIRECT 127.4 12/27/2011   LDLCALC  139 (H) 02/01/2021   ALT 28 02/01/2021   AST 25 02/01/2021   NA 140 02/01/2021   K 5.0 02/01/2021   CL 103 02/01/2021   CREATININE 0.91 02/01/2021   BUN 21 02/01/2021   CO2 28 02/01/2021   TSH 1.12 02/01/2021   INR 0.9 05/29/2019    Assessment/Plan:  Community acquired pneumonia of left upper lobe of lung -Improving -As noted on imaging 12/11/2021 at [facility] -Likely caused by COVID-19 infection 11/30/2021 -Complete antibiotic course of azithromycin and amoxicillin -Okay to use OTC cough/cold medications  as needed -Continue supportive care with rest, hydration being from shower, etc. -Discussed likely course.  Advised may take weeks for energy level to return to normal. -Given strict precautions for continued or worsening symptoms -Consider repeating CXR in 4-6 weeks to monitor for resolution  F/u as needed  Abbe Amsterdam, MD

## 2021-12-20 ENCOUNTER — Ambulatory Visit: Payer: Medicare HMO | Admitting: Internal Medicine

## 2021-12-22 ENCOUNTER — Other Ambulatory Visit: Payer: Self-pay | Admitting: Rheumatology

## 2021-12-22 NOTE — Telephone Encounter (Signed)
Next Visit: 03/08/2022    Last Visit: 09/08/2021   UDS:09/08/2021   Narc Agreement: 03/16/2021   Last Fill:11/18/2021 tramadol 50 mg 1 tablet by mouth daily as needed for pain relief   Okay to refill Tramadol?

## 2021-12-23 ENCOUNTER — Telehealth: Payer: Self-pay

## 2021-12-23 NOTE — Telephone Encounter (Signed)
Patient called and stated the pharmacy advised her that our office declined the refill request for tramadol. I advised patient it was not declined, the refill was sent in yesterday and receipt was confirmed by pharmacy. Patient states she just spoke with the pharmacy prior to calling our office. I call [facility] and they did receive the prescription and requested that the patient call them. I called patient and advised.

## 2021-12-28 ENCOUNTER — Encounter: Payer: Self-pay | Admitting: Internal Medicine

## 2021-12-28 ENCOUNTER — Ambulatory Visit (INDEPENDENT_AMBULATORY_CARE_PROVIDER_SITE_OTHER): Payer: Medicare HMO | Admitting: Internal Medicine

## 2021-12-28 VITALS — BP 128/60 | HR 80 | Temp 97.5°F | Ht 59.75 in | Wt 136.6 lb

## 2021-12-28 DIAGNOSIS — M545 Low back pain, unspecified: Secondary | ICD-10-CM | POA: Diagnosis not present

## 2021-12-28 DIAGNOSIS — I10 Essential (primary) hypertension: Secondary | ICD-10-CM | POA: Diagnosis not present

## 2021-12-28 DIAGNOSIS — Z0000 Encounter for general adult medical examination without abnormal findings: Secondary | ICD-10-CM

## 2021-12-28 DIAGNOSIS — E785 Hyperlipidemia, unspecified: Secondary | ICD-10-CM

## 2021-12-28 DIAGNOSIS — Z79899 Other long term (current) drug therapy: Secondary | ICD-10-CM | POA: Diagnosis not present

## 2021-12-28 DIAGNOSIS — M1990 Unspecified osteoarthritis, unspecified site: Secondary | ICD-10-CM

## 2021-12-28 DIAGNOSIS — G8929 Other chronic pain: Secondary | ICD-10-CM

## 2021-12-28 NOTE — Patient Instructions (Addendum)
Good to see you today   Lab update today . Consider PT when you  are in [place] if needed.  Appears  pneumonia is resolved

## 2021-12-28 NOTE — Progress Notes (Signed)
Chief Complaint  Patient presents with   Annual Exam    HPI: Patient  Briana Giles  77 y.o. comes in today for Preventive Health Care visit  and med check  BP doing on on lisinopril Having back pain with walking at times   newer  sees  dr D for meds  Hx of Back pain radioablation.  Dr Ernestina Patches helped in past  no new neuro sx. Was rx for pna left with cough and left sided cp   Health Maintenance  Topic Date Due   COVID-19 Vaccine (6 - Pfizer risk series) 12/22/2021   Medicare Annual Wellness (AWV)  11/12/2022   TETANUS/TDAP  01/07/2024   Pneumonia Vaccine 68+ Years old  Completed   INFLUENZA VACCINE  Completed   DEXA SCAN  Completed   Hepatitis C Screening  Completed   Zoster Vaccines- Shingrix  Completed   HPV VACCINES  Aged Out   COLONOSCOPY (Pts 45-69yr Insurance coverage will need to be confirmed)  Discontinued   Health Maintenance Review LIFESTYLE:  Exercise:   Y per day .  Tobacco/ETS: no Alcohol:  one - 2 per day  Sugar beverages: Sleep:8 hours  Drug use: no HH of  2   3 cats    second home [place]. Son lives  there.  Wil be there for the winter    ROS:  REST of 12 system review negative except as per HPI   Past Medical History:  Diagnosis Date   Abdominal pain, other specified site    Abnormal LFTs    Allergic rhinitis    Cervical back pain with evidence of disc disease    GERD (gastroesophageal reflux disease)    Heart murmur    says all her life had echo in [place] year ago not to worry   Hypertension    Nerve damage    left arm   Osteoarthritis    Osteopenia    Phlebitis    Spinal stenosis    Transient global amnesia     Past Surgical History:  Procedure Laterality Date   BUNIONECTOMY     fall 2022 [facility]   CHOLECYSTECTOMY     CYST EXCISION Right 01/2018   right thumb    FOOT SURGERY      x 2     HAND SURGERY     right hand   KNEE ARTHROSCOPY     right   REPLACEMENT UNICONDYLAR JOINT KNEE     rt knee   TONSILLECTOMY     TOTAL  KNEE ARTHROPLASTY Left 06/2019    Family History  Problem Relation Age of Onset   Hypertension Father    Alcohol abuse Father    Lung cancer Father    Cancer Maternal Grandmother        sinus   COPD Mother    Osteoporosis Mother    Heart disease Mother    Hypertension Brother    Heart Problems Brother    Arthritis Brother    Prostate cancer Maternal Grandfather    Heart attack Brother    Hypertension Brother    Healthy Daughter    Healthy Son    Colon cancer Neg Hx    Pancreatic cancer Neg Hx    Stomach cancer Neg Hx     Social History   Socioeconomic History   Marital status: Married    Spouse name: Not on file   Number of children: 2   Years of education: Not on file   Highest  education level: Not on file  Occupational History   Occupation: retired Product manager: RETIRED  Tobacco Use   Smoking status: Never    Passive exposure: Never   Smokeless tobacco: Never  Vaping Use   Vaping Use: Never used  Substance and Sexual Activity   Alcohol use: Yes    Comment: occasional    Drug use: Never   Sexual activity: Not on file  Other Topics Concern   Not on file  Social History Narrative   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  Scraper grand child    Does water exercise aerobics walking elliptical    Social Determinants of Health   Financial Resource Strain: Low Risk  (11/11/2021)   Overall Financial Resource Strain (CARDIA)    Difficulty of Paying Living Expenses: Not hard at all  Food Insecurity: No Food Insecurity (11/11/2021)   Hunger Vital Sign    Worried About Running Out of Food in the Last Year: Never true    Ran Out of Food in the Last Year: Never true  Transportation Needs: No Transportation Needs (11/11/2021)   PRAPARE - Hydrologist (Medical): No    Lack of Transportation (Non-Medical): No  Physical Activity: Sufficiently Active (11/11/2021)    Exercise Vital Sign    Days of Exercise per Week: 4 days    Minutes of Exercise per Session: 60 min  Stress: No Stress Concern Present (11/11/2021)   [place]    Feeling of Stress : Not at all  Social Connections: [place] (11/11/2021)   Social Connection and Isolation Panel [NHANES]    Frequency of Communication with Friends and Family: More than three times a week    Frequency of Social Gatherings with Friends and Family: More than three times a week    Attends Religious Services: More than 4 times per year    Active Member of [employer] or Organizations: Yes    Attends Music therapist: More than 4 times per year    Marital Status: Married    Outpatient Medications Prior to Visit  Medication Sig Dispense Refill   acetaminophen (TYLENOL) 500 MG tablet Take 1,000 mg by mouth every 6 (six) hours as needed for mild pain, moderate pain or headache.     fluticasone (FLONASE) 50 MCG/ACT nasal spray USE TWO SPRAYS IN EACH NOSTRIL ONCE DAILY. 16 g 5   lisinopril (ZESTRIL) 20 MG tablet TAKE 1 TABLET BY MOUTH EVERY DAY 90 tablet 2   polyethylene glycol powder (MIRALAX) powder Take 17 g by mouth once. (Patient taking differently: Take 17 g by mouth as needed.) 255 g 0   traMADol (ULTRAM) 50 MG tablet TAKE 1 TABLET(50 MG) BY MOUTH DAILY AS NEEDED 30 tablet 0   traZODone (DESYREL) 50 MG tablet TAKE 1 TABLET(50 MG) BY MOUTH AT BEDTIME AS NEEDED FOR SLEEP. MAY INCREASE TO 1 AND 1/2 TABLETS 75 MG DOSE 30 tablet 1   HYDROcodone bit-homatropine (HYCODAN) 5-1.5 MG/5ML syrup Take 5 mLs by mouth every 4 (four) hours as needed. (Patient not taking: Reported on 12/28/2021) 240 mL 0   No facility-administered medications prior to visit.     EXAM:  BP 128/60 (BP Location: Right Arm, Patient Position: Sitting, Cuff Size: Normal)   Pulse 80   Temp (!) 97.5 F (36.4 C) (Oral)  Ht 4' 11.75" (1.518 m)   Wt 136 lb 9.6 oz  (62 kg)   SpO2 96%   BMI 26.90 kg/m   Body mass index is 26.9 kg/m. Wt Readings from Last 3 Encounters:  12/28/21 136 lb 9.6 oz (62 kg)  12/16/21 134 lb 3.2 oz (60.9 kg)  11/11/21 134 lb (60.8 kg)    Physical Exam: Vital signs reviewed [id number] is a well-developed well-nourished alert cooperative    who appearsr stated age in no acute distress.  HEENT: normocephalic atraumatic , Eyes: PERRL EOM's full, conjunctiva clear, Nares: paten,t no deformity discharge or tenderness., Ears: no deformity EAC's clear TMs with normal landmarks. Mouth: clear OP, no lesions, edema.  Moist mucous membranes. Dentition in adequate repair. NECK: supple without masses, thyromegaly or bruits. CHEST/PULM:  Clear to auscultation and percussion breath sounds equal no wheeze , rales or rhonchi. No chest wall deformities or tenderness. Breast: normal by inspection . No dimpling, discharge, masses, tenderness or discharge . CV: PMI is nondisplaced, S1 S2 no gallops, 2/6 short systolic m heard best in upright no radiation, rubs. Peripheral pulses are full without delay. ABDOMEN: Bowel sounds normal nontender  No guard or rebound, no hepato splenomegal no CVA tenderness.  . Extremtities:  No clubbing cyanosis or edema, no acute joint swelling or redness no focal atrophy some OA changes NEURO:  Oriented x3, cranial nerves 3-12 appear to be intact, no obvious focal weakness,gait within normal limits no abnormal reflexes or asymmetrical SKIN: No acute rashes normal turgor, color, no bruising or petechiae. PSYCH: Oriented, good eye contact, no obvious depression anxiety, cognition and judgment appear normal. LN: no cervical axillaryl adenopathy  Lab Results  Component Value Date   WBC 4.4 02/01/2021   HGB 14.5 02/01/2021   HCT 44.2 02/01/2021   PLT 258.0 02/01/2021   GLUCOSE 87 02/01/2021   CHOL 226 (H) 02/01/2021   TRIG 90.0 02/01/2021   HDL 69.40 02/01/2021   LDLDIRECT 127.4 12/27/2011   LDLCALC 139 (H)  02/01/2021   ALT 28 02/01/2021   AST 25 02/01/2021   NA 140 02/01/2021   K 5.0 02/01/2021   CL 103 02/01/2021   CREATININE 0.91 02/01/2021   BUN 21 02/01/2021   CO2 28 02/01/2021   TSH 1.12 02/01/2021   INR 0.9 05/29/2019    BP Readings from Last 3 Encounters:  12/28/21 128/60  12/16/21 124/86  09/08/21 129/77    Lab plan reviewed with patient  update  Lab today  NF soup for llunch  ASSESSMENT AND PLAN:  Discussed the following assessment and plan:    ICD-10-CM   1. Visit for preventive health examination  Z00.00     2. Essential hypertension  A54 Basic metabolic panel    CBC with Differential/Platelet    Hepatic function panel    Lipid panel    TSH    TSH    Lipid panel    Hepatic function panel    CBC with Differential/Platelet    Basic metabolic panel    3. Medication management  U98.119 Basic metabolic panel    CBC with Differential/Platelet    Hepatic function panel    Lipid panel    TSH    TSH    Lipid panel    Hepatic function panel    CBC with Differential/Platelet    Basic metabolic panel    4. Intermittent  low back pain, unspecified back pain laterality, unspecified whether sciatica present  J47.82 Basic metabolic panel   N56.21 CBC  with Differential/Platelet    Hepatic function panel    Lipid panel    TSH    TSH    Lipid panel    Hepatic function panel    CBC with Differential/Platelet    Basic metabolic panel    5. Hyperlipidemia, unspecified hyperlipidemia type  I50.3 Basic metabolic panel    CBC with Differential/Platelet    Hepatic function panel    Lipid panel    TSH    TSH    Lipid panel    Hepatic function panel    CBC with Differential/Platelet    Basic metabolic panel    6. Osteoarthritis, unspecified osteoarthritis type, unspecified site  U88.28 Basic metabolic panel    CBC with Differential/Platelet    Hepatic function panel    Lipid panel    TSH    TSH    Lipid panel    Hepatic function panel    CBC with  Differential/Platelet    Basic metabolic panel    Update labs  consider PT when she gets to [place] if persisting problem  HT controlled  continue same medication Record review   about pna rx augmentin and z pack x ray cw bronchitis  exam cw pna left . Is a lot better so no fu needed  States she has had the murmur since  young age and never caused problem  and no sx  will revewi record and follow consider echo if not characterized  in past Return in about 1 year (around 12/29/2022) for depending on results.  Patient Care Team: Itzayanna Kaster, Standley Brooking, MD as PCP - General [facility], Tamala Fothergill, DPM (Podiatry) Bo Merino, MD (Rheumatology) Milus Banister, MD (Gastroenterology) Jari Pigg, MD as Attending Physician (Dermatology) Laurance Flatten, MD as Referring Physician (Orthopedic Surgery) Patient Instructions  Good to see you today   Lab update today . Consider PT when you  are in [place] if needed.  Appears  pneumonia is resolved      Standley Brooking. Dasani Thurlow M.D.

## 2021-12-29 LAB — LIPID PANEL
Cholesterol: 229 mg/dL — ABNORMAL HIGH (ref 0–200)
HDL: 68.6 mg/dL (ref >39.00)
LDL Cholesterol: 136 mg/dL — ABNORMAL HIGH (ref 0–99)
NonHDL: 160.82
Total CHOL/HDL Ratio: 3
Triglycerides: 122 mg/dL (ref 0.0–149.0)
VLDL: 24.4 mg/dL (ref 0.0–40.0)

## 2021-12-29 LAB — BASIC METABOLIC PANEL
BUN: 27 mg/dL — ABNORMAL HIGH (ref 6–23)
CO2: 30 mEq/L (ref 19–32)
Calcium: 9.9 mg/dL (ref 8.4–10.5)
Chloride: 102 mEq/L (ref 96–112)
Creatinine, Ser: 1.12 mg/dL (ref 0.40–1.20)
GFR: 47.5 mL/min — ABNORMAL LOW (ref >60.00)
Glucose, Bld: 100 mg/dL — ABNORMAL HIGH (ref 70–99)
Potassium: 5.1 mEq/L (ref 3.5–5.1)
Sodium: 137 mEq/L (ref 135–145)

## 2021-12-29 LAB — CBC WITH DIFFERENTIAL/PLATELET
Basophils Absolute: 0.1 10*3/uL (ref 0.0–0.1)
Basophils Relative: 1.4 % (ref 0.0–3.0)
Eosinophils Absolute: 0.3 10*3/uL (ref 0.0–0.7)
Eosinophils Relative: 4.6 % (ref 0.0–5.0)
HCT: 42.3 % (ref 36.0–46.0)
Hemoglobin: 14.3 g/dL (ref 12.0–15.0)
Lymphocytes Relative: 25.1 % (ref 12.0–46.0)
Lymphs Abs: 1.4 10*3/uL (ref 0.7–4.0)
MCHC: 33.8 g/dL (ref 30.0–36.0)
MCV: 99 fl (ref 78.0–100.0)
Monocytes Absolute: 0.5 10*3/uL (ref 0.1–1.0)
Monocytes Relative: 8.7 % (ref 3.0–12.0)
Neutro Abs: 3.4 10*3/uL (ref 1.4–7.7)
Neutrophils Relative %: 60.2 % (ref 43.0–77.0)
Platelets: 259 10*3/uL (ref 150.0–400.0)
RBC: 4.28 Mil/uL (ref 3.87–5.11)
RDW: 13.6 % (ref 11.5–15.5)
WBC: 5.6 10*3/uL (ref 4.0–10.5)

## 2021-12-29 LAB — HEPATIC FUNCTION PANEL
ALT: 23 U/L (ref 0–35)
AST: 27 U/L (ref 0–37)
Albumin: 4.4 g/dL (ref 3.5–5.2)
Alkaline Phosphatase: 113 U/L (ref 39–117)
Bilirubin, Direct: 0.1 mg/dL (ref 0.0–0.3)
Total Bilirubin: 0.4 mg/dL (ref 0.2–1.2)
Total Protein: 7.4 g/dL (ref 6.0–8.3)

## 2021-12-29 LAB — TSH: TSH: 1.67 u[IU]/mL (ref 0.35–5.50)

## 2021-12-30 NOTE — Progress Notes (Signed)
Thyroid in range  kidney function about the same from summer but down from  a year ago .Marland Kitchen Limit any antiinflammatory  medications as possible such as ibuprofen . Should follow labs in  about 6 mos when y ou get back from [facility]   ( order bmp future)  sharing info with Dr D

## 2021-12-31 ENCOUNTER — Other Ambulatory Visit: Payer: Self-pay

## 2021-12-31 DIAGNOSIS — Z79899 Other long term (current) drug therapy: Secondary | ICD-10-CM

## 2022-01-20 ENCOUNTER — Other Ambulatory Visit: Payer: Self-pay

## 2022-01-20 MED ORDER — TRAMADOL HCL 50 MG PO TABS: ORAL_TABLET | ORAL | 0 refills | Status: DC

## 2022-01-20 NOTE — Telephone Encounter (Signed)
Patient called and requested a refill of tramadol to [facility]  on [address] in [place] as they are living there for several months. Patient did update this on previous narcotic agreement.   Next Visit: 03/08/2022  Last Visit: 09/08/2021  UDS:09/08/2021 c/w  Narc Agreement: 09/08/2021  Last Fill: 12/22/2021  Okay to refill tramadol?

## 2022-02-22 NOTE — Progress Notes (Deleted)
Virtual Visit via Video Note  I connected with Briana Giles on 02/22/22 at  3:20 PM EST by a video enabled telemedicine application and verified that I am speaking with the correct person using two identifiers.  Location: Patient: *** Provider: ***   I discussed the limitations of evaluation and management by telemedicine and the availability of in person appointments. The patient expressed understanding and agreed to proceed.  History of Present Illness:  ROS     Observations/Objective:  Physical Exam  Assessment and Plan: Visit Diagnoses: Primary osteoarthritis of both hands-she has bilateral PIP and DIP thickening.  No synovitis was noted.  She has been having some discomfort in her right fifth PIP joint.  Topical agents were discussed.  Joint protection muscle strengthening was discussed.   S/P total knee arthroplasty, left-chronic pain   History of partial knee replacement - Right.  Chronic pain   Primary osteoarthritis of both feet-she has overcrowding of the toes.  Toe separator was advised.   History of bunionectomy bilateral   DDD (degenerative disc disease), cervical - X-rays of the C-spine updated on 02/01/21: Multilevel spondylosis and facet hypertrophy, greatest C4-C5 through C6-7. She has chronic discomfort.   DDD (degenerative disc disease), lumbar-has off-and-on discomfort in her lower back.   Other chronic pain - tramadol 50 mg 1 tablet by mouth daily as needed for pain relief.  Last UDS September 08, 2021.  UDS & narcotic agreement: 03/16/2021.  Patient states that she has bought a house in [place].  She will be living in [place] for 6 months and 6 months here.  She will establish with a rheumatologist or PCP in [place].   Medication monitoring encounter-we will check UDS today.   Osteopenia of multiple sites - Most recent DEXA was in 2016.  She is overdue to update DEXA.  Reminded patient to get repeat DEXA scan.  Other medical problems are listed as follows:    Family history of rheumatoid arthritis   History of varicose veins   Abnormal serum angiotensin-converting enzyme level   History of hypertension   History of gastroesophageal reflux (GERD)  Follow Up Instructions:    I discussed the assessment and treatment plan with the patient. The patient was provided an opportunity to ask questions and all were answered. The patient agreed with the plan and demonstrated an understanding of the instructions.   The patient was advised to call back or seek an in-person evaluation if the symptoms worsen or if the condition fails to improve as anticipated.  I provided *** minutes of non-face-to-face time during this encounter.   Bo Merino, MD

## 2022-02-28 ENCOUNTER — Other Ambulatory Visit: Payer: Self-pay | Admitting: Rheumatology

## 2022-02-28 NOTE — Telephone Encounter (Signed)
Next Visit: 03/08/2022   Last Visit: 09/08/2021   UDS:09/08/2021 c/w   Narc Agreement: 09/08/2021   Last Fill:01/20/2022   Okay to refill tramadol?

## 2022-02-28 NOTE — Telephone Encounter (Signed)
Due to update UDS.  Please pend order.

## 2022-03-08 ENCOUNTER — Telehealth: Payer: Medicare HMO | Admitting: Rheumatology

## 2022-03-08 DIAGNOSIS — M19041 Primary osteoarthritis, right hand: Secondary | ICD-10-CM

## 2022-03-08 DIAGNOSIS — Z9889 Other specified postprocedural states: Secondary | ICD-10-CM

## 2022-03-08 DIAGNOSIS — M19072 Primary osteoarthritis, left ankle and foot: Secondary | ICD-10-CM

## 2022-03-08 DIAGNOSIS — Z8719 Personal history of other diseases of the digestive system: Secondary | ICD-10-CM

## 2022-03-08 DIAGNOSIS — Z5181 Encounter for therapeutic drug level monitoring: Secondary | ICD-10-CM

## 2022-03-08 DIAGNOSIS — Z8679 Personal history of other diseases of the circulatory system: Secondary | ICD-10-CM

## 2022-03-08 DIAGNOSIS — M8589 Other specified disorders of bone density and structure, multiple sites: Secondary | ICD-10-CM

## 2022-03-08 DIAGNOSIS — Z96659 Presence of unspecified artificial knee joint: Secondary | ICD-10-CM

## 2022-03-08 DIAGNOSIS — Z8261 Family history of arthritis: Secondary | ICD-10-CM

## 2022-03-08 DIAGNOSIS — G8929 Other chronic pain: Secondary | ICD-10-CM

## 2022-03-08 DIAGNOSIS — M5030 Other cervical disc degeneration, unspecified cervical region: Secondary | ICD-10-CM

## 2022-03-08 DIAGNOSIS — Z96652 Presence of left artificial knee joint: Secondary | ICD-10-CM

## 2022-03-08 DIAGNOSIS — M5136 Other intervertebral disc degeneration, lumbar region: Secondary | ICD-10-CM

## 2022-03-31 ENCOUNTER — Other Ambulatory Visit: Payer: Self-pay | Admitting: Physician Assistant

## 2022-03-31 NOTE — Telephone Encounter (Signed)
Next Visit: 06/29/2022   Last Visit: 09/08/2021   UDS:09/08/2021 c/w   Narc Agreement: 09/08/2021   Last Fill:01/20/2022  Patient cancelled the virtual appointment that you advised she need to have in order to continue prescribing the medication while she is in [place]. She was made aware she needed this appointment in October.    Okay to refill tramadol?

## 2022-03-31 NOTE — Progress Notes (Signed)
Virtual Visit via Telephone Note  I connected with Briana Giles on 04/05/22 at 12:00 PM EST by telephone and verified that I am speaking with the correct person using two identifiers.  Location: Patient: Home in [place] Provider: in office   I discussed the limitations, risks, security and privacy concerns of performing an evaluation and management service by telephone and the availability of in person appointments. I also discussed with the patient that there may be a patient responsible charge related to this service. The patient expressed understanding and agreed to proceed. Chief complaint: Pain in multiple joints  History of Present Illness: Briana Giles is a 77 year old female with history of osteoarthritis, degenerative disc disease and osteopenia.  Briana Giles is currently in [facility].  Briana Giles states Briana Giles continues to have pain and discomfort in her bilateral hands, her knee joints and her feet.  Briana Giles continues to have lower back pain.  Briana Giles ran out of tramadol.  Briana Giles has been taking Tylenol which is not helping her much.  Briana Giles states Briana Giles takes tramadol 50 mg p.o. nightly which helps to reduce her pain level from 6 to almost 0.  Briana Giles plans to return back to [facility] in the first week of April and will get UDS when Briana Giles comes back.  Briana Giles denies any history of joint swelling.  Activities of daily living:  Patient reports morning stiffness for about 2 hours Patient denies nocturnal pain Patient denies difficulty dressing or grooming Patient denies difficulty climbing stairs Patient denies difficulty getting out of the chair Patient is denies difficulty writing  Review of Systems  Constitutional:  Negative for malaise/fatigue.  HENT:  Negative for ear pain and sinus pain.   Eyes:  Negative for pain and redness.  Respiratory:  Negative for shortness of breath.   Cardiovascular:  Negative for chest pain.  Gastrointestinal:  Negative for constipation and heartburn.  Genitourinary:  Negative  for dysuria and frequency.  Musculoskeletal:  Positive for joint pain. Negative for myalgias.  Skin:  Negative for rash.  Neurological:  Negative for weakness and headaches.  Endo/Heme/Allergies:  Negative for polydipsia.  Psychiatric/Behavioral:  Negative for depression. The patient does not have insomnia.       Observations/Objective: Physical Exam Neurological:     Mental Status: Briana Giles is alert and oriented to person, place, and time.  Psychiatric:        Mood and Affect: Mood normal.        Behavior: Behavior normal.      Assessment and Plan: Visit Diagnoses: Primary osteoarthritis of both hands-patient has known history of osteoarthritis.  Briana Giles denies any increased joint swelling.  Briana Giles continues to have stiffness in her hands and discomfort holding objects.  Joint protection muscle strengthening was discussed.   S/P total knee arthroplasty, left-chronic pain   History of partial knee replacement -Briana Giles continues to have chronic pain.   Primary osteoarthritis of both feet-Briana Giles has overcrowding of the toes.  Briana Giles complains of discomfort in her toes.   History of bunionectomy bilateral   DDD (degenerative disc disease), cervical -Briana Giles has off-and-on discomfort in her cervical spine.  X-rays of the C-spine updated on 02/01/21: Multilevel spondylosis and facet hypertrophy, greatest C4-C5 through C6-7.    DDD (degenerative disc disease), lumbar-has off-and-on discomfort in her lower back.  Patient states Briana Giles has been walking about 2 miles every day with her husband.  Briana Giles develops discomfort in her lower back after the walk.   Other chronic pain -Briana Giles takes tramadol 50 mg 1  tablet by mouth daily as needed for pain relief.  Briana Giles ran out of tramadol.  Briana Giles is requesting prescription refill for tramadol.  Briana Giles is not taking any other narcotics or benzodiazepines.  Last UDS was in July 2023.  Briana Giles will be returning back from [place] in April and will get UDS when Briana Giles returns back.  Patient states that  Briana Giles has bought a house in [place].  Briana Giles will be living in [place] for 6 months and 6 months here.  Briana Giles was unable to establish with a PCP and rheumatologist in [place].   Medication monitoring encounter-patient will get UDS when Briana Giles comes back in April.   Osteopenia of multiple sites - Most recent DEXA was in 2016.  Briana Giles is overdue to update DEXA.  Reminded patient to get repeat DEXA scan when Briana Giles returns.  Other medical problems are listed as follows:   Family history of rheumatoid arthritis   History of varicose veins   Abnormal serum angiotensin-converting enzyme level   History of hypertension   History of gastroesophageal reflux (GERD)  Follow Up Instructions:    I discussed the assessment and treatment plan with the patient. The patient was provided an opportunity to ask questions and all were answered. The patient agreed with the plan and demonstrated an understanding of the instructions.   The patient was advised to call back or seek an in-person evaluation if the symptoms worsen or if the condition fails to improve as anticipated.  I provided 20 minutes of non-face-to-face time during this encounter.   Bo Merino, MD

## 2022-04-05 ENCOUNTER — Encounter: Payer: Self-pay | Admitting: Rheumatology

## 2022-04-05 ENCOUNTER — Ambulatory Visit (INDEPENDENT_AMBULATORY_CARE_PROVIDER_SITE_OTHER): Payer: Medicare HMO | Admitting: Rheumatology

## 2022-04-05 VITALS — Ht 60.5 in

## 2022-04-05 DIAGNOSIS — Z8261 Family history of arthritis: Secondary | ICD-10-CM

## 2022-04-05 DIAGNOSIS — Z96652 Presence of left artificial knee joint: Secondary | ICD-10-CM

## 2022-04-05 DIAGNOSIS — Z96659 Presence of unspecified artificial knee joint: Secondary | ICD-10-CM

## 2022-04-05 DIAGNOSIS — M19071 Primary osteoarthritis, right ankle and foot: Secondary | ICD-10-CM

## 2022-04-05 DIAGNOSIS — Z8719 Personal history of other diseases of the digestive system: Secondary | ICD-10-CM

## 2022-04-05 DIAGNOSIS — M19041 Primary osteoarthritis, right hand: Secondary | ICD-10-CM | POA: Diagnosis not present

## 2022-04-05 DIAGNOSIS — M19072 Primary osteoarthritis, left ankle and foot: Secondary | ICD-10-CM

## 2022-04-05 DIAGNOSIS — R748 Abnormal levels of other serum enzymes: Secondary | ICD-10-CM

## 2022-04-05 DIAGNOSIS — M19042 Primary osteoarthritis, left hand: Secondary | ICD-10-CM

## 2022-04-05 DIAGNOSIS — Z8679 Personal history of other diseases of the circulatory system: Secondary | ICD-10-CM

## 2022-04-05 DIAGNOSIS — M5030 Other cervical disc degeneration, unspecified cervical region: Secondary | ICD-10-CM

## 2022-04-05 DIAGNOSIS — Z9889 Other specified postprocedural states: Secondary | ICD-10-CM

## 2022-04-05 DIAGNOSIS — Z5181 Encounter for therapeutic drug level monitoring: Secondary | ICD-10-CM

## 2022-04-05 DIAGNOSIS — M8589 Other specified disorders of bone density and structure, multiple sites: Secondary | ICD-10-CM

## 2022-04-05 DIAGNOSIS — M5136 Other intervertebral disc degeneration, lumbar region: Secondary | ICD-10-CM

## 2022-04-05 DIAGNOSIS — G8929 Other chronic pain: Secondary | ICD-10-CM

## 2022-04-06 ENCOUNTER — Other Ambulatory Visit: Payer: Self-pay | Admitting: Rheumatology

## 2022-04-06 NOTE — Telephone Encounter (Signed)
Next Visit: 06/29/2022  Last Visit: 04/05/2022  UDS:09/08/2021 UDS is consistent with tramadol use.   Narc Agreement: 09/08/2021  Last Fill: 02/28/2022 tramadol 50 mg p.o. nightly   Okay to refill Tramadol?

## 2022-04-10 ENCOUNTER — Other Ambulatory Visit: Payer: Self-pay | Admitting: Internal Medicine

## 2022-05-05 ENCOUNTER — Other Ambulatory Visit: Payer: Self-pay | Admitting: Rheumatology

## 2022-05-05 NOTE — Telephone Encounter (Signed)
Last Fill: 04/06/2022  UDS:09/08/2021   Narc Agreement: 09/08/2021  Next Visit: 06/29/2022  Last Visit: 09/08/2021  Dx: Other chronic pain   Current Dose per office note on 09/08/2021:tramadol 50 mg 1 tablet by mouth daily as needed for pain relief.   Per Dr. Estanislado Pandy patient to update UDS and narcotic agreement when she comes back to town  Okay to refill Tramadol?

## 2022-06-03 ENCOUNTER — Telehealth: Payer: Self-pay | Admitting: *Deleted

## 2022-06-03 NOTE — Telephone Encounter (Signed)
Patient contacted the office requesting a refill on her Tramadol. Patient states she is travelling to [place] and is requesting the prescription sent to a pharmacy there. She is requesting it to be sent to the [facility] in [facility] at [address] and [address]. Advised patient I would have to discuss with Dr. Corliss Skains as she has a signed narcotic agreement and the pharmacy listed on her agreement is [facility].   Spoke with Dr. Corliss Skains about it and she states we will be unable to send the medication to [facility].  Attempted to contact the patient and left message for patient to call the office.

## 2022-06-06 NOTE — Telephone Encounter (Signed)
Spoke with patient and advised patient Dr. Corliss Skains will not send the prescription for Tramadol to [facility]. Patient advised Dr. Corliss Skains was doing a favor by sending it to the pharmacy on [address]. Advised patient she can have the prescription sent to the pharmacy on her narcotic agreement, [facility] [address] and have someone here ship it to her.

## 2022-06-16 NOTE — Progress Notes (Deleted)
Office Visit Note  Patient: Briana Giles             Date of Birth: [birth year]           MRN: [id number]             PCP: Madelin Headings, MD Referring: Madelin Headings, MD Visit Date: 06/29/2022 Occupation: @GUAROCC @  Subjective:  No chief complaint on file.   History of Present Illness: Briana Giles is a 77 y.o. female ***     Activities of Daily Living:  Patient reports morning stiffness for *** {minute/hour:19697}.   Patient {ACTIONS;DENIES/REPORTS:21021675::"Denies"} nocturnal pain.  Difficulty dressing/grooming: {ACTIONS;DENIES/REPORTS:21021675::"Denies"} Difficulty climbing stairs: {ACTIONS;DENIES/REPORTS:21021675::"Denies"} Difficulty getting out of chair: {ACTIONS;DENIES/REPORTS:21021675::"Denies"} Difficulty using hands for taps, buttons, cutlery, and/or writing: {ACTIONS;DENIES/REPORTS:21021675::"Denies"}  No Rheumatology ROS completed.   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   Abdominal bruit 12/31/2012   Encounter for Medicare annual wellness exam 12/31/2012   Degenerative joint disease of spine multilevel 03/07/2012   Spinal stenosis, lumbar 03/07/2012   Hip pain 09/02/2011   Sciatica of left side 08/14/2011   Spinal stenosis of lumbar region 08/14/2011   Encounter for long-term (current) use of other medications 06/25/2011   High risk medication use 12/21/2010   Need for lipid screening 12/21/2010   Foot pain, left 07/22/2010   CHEST PAIN 02/18/2010   Osteopenia 07/05/2009   TREMOR, RIGHT HAND 01/26/2009    BACK PAIN 09/30/2008   NEOPLASM, SKIN, UNCERTAIN BEHAVIOR 05/27/2008   HYPERPOTASSEMIA 05/27/2008   Essential hypertension 10/12/2007   TRANSIENT GLOBAL AMNESIA 10/12/2007   VARICOSE VEINS, LOWER EXTREMITIES 06/06/2007   SYSTOLIC MURMUR 06/06/2007   ABDOMINAL BRUIT 06/06/2007   ALLERGIC RHINITIS 08/17/2006   GERD 08/17/2006   Osteoarthritis 08/17/2006   Disturbance in sleep behavior 08/17/2006    Past Medical History:  Diagnosis Date   Abdominal pain, other specified site    Abnormal LFTs    Allergic rhinitis    Cervical back pain with evidence of disc disease    GERD (gastroesophageal reflux disease)    Heart murmur    says all her life had echo in [place] year ago not to worry   Hypertension    Nerve damage    left arm   Osteoarthritis    Osteopenia    Phlebitis    Spinal stenosis    Transient global amnesia     Family History  Problem Relation Age of Onset   Hypertension Father    Alcohol abuse Father    Lung cancer Father    Cancer Maternal Grandmother        sinus   COPD Mother    Osteoporosis Mother    Heart disease Mother    Hypertension Brother    Heart Problems Brother    Arthritis Brother    Prostate cancer Maternal Grandfather    Heart attack Brother    Hypertension Brother    Healthy Daughter    Healthy Son    Colon cancer Neg Hx    Pancreatic cancer Neg Hx    Stomach cancer Neg Hx  Past Surgical History:  Procedure Laterality Date   BUNIONECTOMY     fall 2022 [facility]   CHOLECYSTECTOMY     CYST EXCISION Right 01/2018   right thumb    FOOT SURGERY      x 2     HAND SURGERY     right hand   KNEE ARTHROSCOPY     right   REPLACEMENT UNICONDYLAR JOINT KNEE     rt knee   TONSILLECTOMY     TOTAL KNEE ARTHROPLASTY Left 06/2019   Social History   Social History Narrative   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  [place] grand child    Does water exercise aerobics  walking elliptical    Immunization History  Administered Date(s) Administered   Fluad Quad(high Dose 65+) 12/14/2020, 10/27/2021   Influenza Split 12/27/2011, 10/15/2012   Influenza Whole 12/03/2007, 12/24/2009, 10/25/2010   Influenza, High Dose Seasonal PF 12/31/2013, 11/06/2014, 12/10/2016, 10/25/2017, 10/31/2018   Influenza,inj,quad, With Preservative 11/10/2017   Influenza-Unspecified 11/14/2016   PFIZER(Purple Top)SARS-COV-2 Vaccination 03/08/2019, 03/30/2019, 10/30/2019, 11/03/2020, 10/27/2021   Pfizer Covid-19 Vaccine Bivalent Booster 74yrs & up 11/03/2020   Pneumococcal Conjugate-13 01/06/2014   Pneumococcal Polysaccharide-23 12/21/2010   Td 05/27/2008   Tdap 01/06/2014   Zoster Recombinat (Shingrix) 10/26/2017, 10/31/2018   Zoster, Live 12/03/2007     Objective: Vital Signs: There were no vitals taken for this visit.   Physical Exam   Musculoskeletal Exam: ***  CDAI Exam: CDAI Score: -- Patient Global: --; Provider Global: -- Swollen: --; Tender: -- Joint Exam 06/29/2022   No joint exam has been documented for this visit   There is currently no information documented on the homunculus. Go to the Rheumatology activity and complete the homunculus joint exam.  Investigation: No additional findings.  Imaging: No results found.  Recent Labs: Lab Results  Component Value Date   WBC 5.6 12/28/2021   HGB 14.3 12/28/2021   PLT 259.0 12/28/2021   NA 137 12/28/2021   K 5.1 12/28/2021   CL 102 12/28/2021   CO2 30 12/28/2021   GLUCOSE 100 (H) 12/28/2021   BUN 27 (H) 12/28/2021   CREATININE 1.12 12/28/2021   BILITOT 0.4 12/28/2021   ALKPHOS 113 12/28/2021   AST 27 12/28/2021   ALT 23 12/28/2021   PROT 7.4 12/28/2021   ALBUMIN 4.4 12/28/2021   CALCIUM 9.9 12/28/2021   GFRAA 68 12/15/2016    Speciality Comments: No specialty comments available.  Procedures:  No procedures performed Allergies: Codeine phosphate   Assessment / Plan:     Visit Diagnoses:  No diagnosis found.  Orders: No orders of the defined types were placed in this encounter.  No orders of the defined types were placed in this encounter.   Face-to-face time spent with patient was *** minutes. Greater than 50% of time was spent in counseling and coordination of care.  Follow-Up Instructions: No follow-ups on file.   Ellen Henri, CMA  Note - This record has been created using Animal nutritionist.  Chart creation errors have been sought, but may not always  have been located. Such creation errors do not reflect on  the standard of medical care.

## 2022-06-29 ENCOUNTER — Ambulatory Visit: Payer: Medicare HMO | Admitting: Rheumatology

## 2022-06-29 DIAGNOSIS — Z96652 Presence of left artificial knee joint: Secondary | ICD-10-CM

## 2022-06-29 DIAGNOSIS — G8929 Other chronic pain: Secondary | ICD-10-CM

## 2022-06-29 DIAGNOSIS — Z8719 Personal history of other diseases of the digestive system: Secondary | ICD-10-CM

## 2022-06-29 DIAGNOSIS — M5030 Other cervical disc degeneration, unspecified cervical region: Secondary | ICD-10-CM

## 2022-06-29 DIAGNOSIS — R748 Abnormal levels of other serum enzymes: Secondary | ICD-10-CM

## 2022-06-29 DIAGNOSIS — M8589 Other specified disorders of bone density and structure, multiple sites: Secondary | ICD-10-CM

## 2022-06-29 DIAGNOSIS — M5136 Other intervertebral disc degeneration, lumbar region: Secondary | ICD-10-CM

## 2022-06-29 DIAGNOSIS — Z96659 Presence of unspecified artificial knee joint: Secondary | ICD-10-CM

## 2022-06-29 DIAGNOSIS — M19072 Primary osteoarthritis, left ankle and foot: Secondary | ICD-10-CM

## 2022-06-29 DIAGNOSIS — Z8261 Family history of arthritis: Secondary | ICD-10-CM

## 2022-06-29 DIAGNOSIS — Z5181 Encounter for therapeutic drug level monitoring: Secondary | ICD-10-CM

## 2022-06-29 DIAGNOSIS — M19041 Primary osteoarthritis, right hand: Secondary | ICD-10-CM

## 2022-06-29 DIAGNOSIS — Z9889 Other specified postprocedural states: Secondary | ICD-10-CM

## 2022-06-29 DIAGNOSIS — Z8679 Personal history of other diseases of the circulatory system: Secondary | ICD-10-CM

## 2022-06-30 NOTE — Progress Notes (Signed)
Office Visit Note  Patient: Briana Giles             Date of Birth: [birth year]           MRN: [id number]             PCP: Madelin Headings, MD Referring: Madelin Headings, MD Visit Date: 07/14/2022 Occupation: @GUAROCC @  Subjective:  Pain in joints  History of Present Illness: Briana Giles is a 77 y.o. female with osteoarthritis, degenerative disc disease and osteopenia.  She continues to have discomfort in her cervical spine, bilateral hands and her knee joints.  She continues to have discomfort in her both knee joints.  Her left knee is totally replaced and the right knee is partially replaced.  She ran out of tramadol about a month ago.  She has not taken tramadol since then.  She continues to take trazodone as needed for insomnia.    Activities of Daily Living:  Patient reports morning stiffness for 1 hour.   Patient Denies nocturnal pain.  Difficulty dressing/grooming: Reports Difficulty climbing stairs: Reports Difficulty getting out of chair: Reports Difficulty using hands for taps, buttons, cutlery, and/or writing: Denies  Review of Systems  Constitutional:  Negative for fatigue.  HENT:  Positive for mouth dryness. Negative for mouth sores.   Eyes:  Negative for dryness.  Respiratory:  Negative for shortness of breath.   Cardiovascular:  Negative for chest pain and palpitations.  Gastrointestinal:  Positive for constipation. Negative for blood in stool and diarrhea.  Endocrine: Negative for increased urination.  Genitourinary:  Negative for involuntary urination.  Musculoskeletal:  Positive for joint pain, joint pain and morning stiffness. Negative for gait problem, joint swelling, myalgias, muscle weakness, muscle tenderness and myalgias.  Skin:  Negative for color change, rash, hair loss and sensitivity to sunlight.  Allergic/Immunologic: Negative for susceptible to infections.  Neurological:  Negative for dizziness and headaches.  Hematological:  Negative for  swollen glands.  Psychiatric/Behavioral:  Negative for depressed mood and sleep disturbance. The patient is not nervous/anxious.     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   Abdominal bruit 12/31/2012   Encounter for Medicare annual wellness exam 12/31/2012   Degenerative joint disease of spine multilevel 03/07/2012   Spinal stenosis, lumbar 03/07/2012   Hip pain 09/02/2011   Sciatica of left side 08/14/2011   Spinal stenosis of lumbar region 08/14/2011   Encounter for long-term (current) use of other medications 06/25/2011   High risk medication use 12/21/2010   Need for lipid screening 12/21/2010   Foot pain, left 07/22/2010   CHEST PAIN 02/18/2010   Osteopenia 07/05/2009   TREMOR, RIGHT HAND 01/26/2009   BACK PAIN 09/30/2008   NEOPLASM, SKIN, UNCERTAIN BEHAVIOR 05/27/2008   HYPERPOTASSEMIA 05/27/2008   Essential hypertension 10/12/2007   TRANSIENT GLOBAL AMNESIA 10/12/2007   VARICOSE VEINS, LOWER EXTREMITIES 06/06/2007   SYSTOLIC MURMUR 06/06/2007   ABDOMINAL BRUIT 06/06/2007   ALLERGIC RHINITIS 08/17/2006   GERD 08/17/2006   Osteoarthritis 08/17/2006   Disturbance in sleep behavior 08/17/2006    Past Medical History:  Diagnosis Date   Abdominal pain, other specified site    Abnormal LFTs    Allergic rhinitis    Cervical back pain with evidence of disc disease    GERD (gastroesophageal reflux disease)    Heart murmur    says all her life had echo in [place] year ago not to worry   Hypertension    Nerve damage    left arm    Osteoarthritis    Osteopenia    Phlebitis    Spinal stenosis    Transient global amnesia     Family History  Problem Relation Age of Onset   Hypertension Father    Alcohol abuse Father    Lung cancer Father    Cancer Maternal Grandmother        sinus   COPD Mother    Osteoporosis Mother    Heart disease Mother    Hypertension Brother    Heart Problems Brother    Arthritis Brother    Prostate cancer Maternal Grandfather    Heart attack Brother    Hypertension Brother    Healthy Daughter    Healthy Son    Colon cancer Neg Hx    Pancreatic cancer Neg Hx    Stomach cancer Neg Hx    Past Surgical History:  Procedure Laterality Date   BUNIONECTOMY     fall 2022 [facility]   CHOLECYSTECTOMY     CYST EXCISION Right 01/2018   right thumb    FOOT SURGERY      x 2     HAND SURGERY     right hand   KNEE ARTHROSCOPY     right   REPLACEMENT UNICONDYLAR JOINT KNEE     rt knee   TONSILLECTOMY     TOTAL KNEE ARTHROPLASTY Left 06/2019   Social History   Social History Narrative   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  [place] grand child    Does water exercise aerobics walking elliptical    Immunization History  Administered Date(s) Administered   Fluad Quad(high Dose 65+) 12/14/2020, 10/27/2021   Influenza Split 12/27/2011, 10/15/2012   Influenza Whole 12/03/2007, 12/24/2009, 10/25/2010   Influenza, High Dose Seasonal PF 12/31/2013, 11/06/2014, 12/10/2016, 10/25/2017, 10/31/2018   Influenza,inj,quad, With Preservative 11/10/2017   Influenza-Unspecified 11/14/2016   PFIZER(Purple Top)SARS-COV-2 Vaccination 03/08/2019, 03/30/2019, 10/30/2019, 11/03/2020, 10/27/2021   Pfizer Covid-19 Vaccine Bivalent Booster 48yrs & up 11/03/2020   Pneumococcal Conjugate-13 01/06/2014   Pneumococcal Polysaccharide-23 12/21/2010   Td 05/27/2008   Tdap 01/06/2014   Zoster Recombinat (Shingrix) 10/26/2017, 10/31/2018    Zoster, Live 12/03/2007     Objective: Vital Signs: BP (!) 149/83 (BP Location: Left Arm, Patient Position: Sitting, Cuff Size: Normal)   Pulse 76   Resp 15   Ht 5\' 1"  (1.549 m)   Wt 135 lb 3.2 oz (61.3 kg)   BMI 25.55 kg/m    Physical Exam Vitals and nursing note reviewed.  Constitutional:      Appearance: She is well-developed.  HENT:     Head: Normocephalic and atraumatic.  Eyes:     Conjunctiva/sclera: Conjunctivae normal.  Cardiovascular:     Rate and Rhythm: Normal rate and regular rhythm.     Heart sounds: Normal heart sounds.  Pulmonary:     Effort: Pulmonary effort is normal.     Breath sounds: Normal breath sounds.  Abdominal:     General:  Bowel sounds are normal.     Palpations: Abdomen is soft.  Musculoskeletal:     Cervical back: Normal range of motion.  Lymphadenopathy:     Cervical: No cervical adenopathy.  Skin:    General: Skin is warm and dry.     Capillary Refill: Capillary refill takes less than 2 seconds.  Neurological:     Mental Status: She is alert and oriented to person, place, and time.  Psychiatric:        Behavior: Behavior normal.      Musculoskeletal Exam: She had good range of motion of the cervical spine.  She has thoracic kyphosis without any point tenderness.  Shoulder joints, elbow joints, wrist joints, MCPs PIPs and DIPs were in good range of motion.  She had bilateral CMC PIP and DIP thickening with no synovitis.  Hip joints were in good range of motion.  Bilateral knee joints were replaced with limited extension and some warmth on palpation.  She had overcrowding of her toes.  Right second hammertoe was noted.  CDAI Exam: CDAI Score: -- Patient Global: --; Provider Global: -- Swollen: --; Tender: -- Joint Exam 07/14/2022   No joint exam has been documented for this visit   There is currently no information documented on the homunculus. Go to the Rheumatology activity and complete the homunculus joint  exam.  Investigation: No additional findings.  Imaging: No results found.  Recent Labs: Lab Results  Component Value Date   WBC 5.6 12/28/2021   HGB 14.3 12/28/2021   PLT 259.0 12/28/2021   NA 137 12/28/2021   K 5.1 12/28/2021   CL 102 12/28/2021   CO2 30 12/28/2021   GLUCOSE 100 (H) 12/28/2021   BUN 27 (H) 12/28/2021   CREATININE 1.12 12/28/2021   BILITOT 0.4 12/28/2021   ALKPHOS 113 12/28/2021   AST 27 12/28/2021   ALT 23 12/28/2021   PROT 7.4 12/28/2021   ALBUMIN 4.4 12/28/2021   CALCIUM 9.9 12/28/2021   GFRAA 68 12/15/2016    Speciality Comments: Patient discontinued tramadol in April 2024  Procedures:  No procedures performed Allergies: Codeine phosphate   Assessment / Plan:     Visit Diagnoses: Primary osteoarthritis of both hands-she continues to have some discomfort in her bilateral hands due to osteoarthritis.  She had bilateral PIP and DIP thickening with no synovitis.  Joint protection muscle strengthening was discussed.  S/P total knee arthroplasty, left-she had limited extension and warmth on palpation.  She complains of chronic discomfort.  History of right partial knee replacement-she continues to have some discomfort in her right knee.  She had limited extension.  She had warmth on palpation.  Primary osteoarthritis of both feet-she had overcrowding of the toes.  She had hammertoes.  Use of proper fitting shoes and toe separator was discussed.  History of bilateral bunionectomy  DDD (degenerative disc disease), cervical -she continues to have some pain and stiffness in her cervical spine.  X-rays of the C-spine updated on 02/01/21: Multilevel spondylosis and facet hypertrophy, greatest C4-C5 through C6-7.  DDD (degenerative disc disease), lumbar-chronic pain.  Other chronic pain -she was on tramadol 50 mg 1 tablet by mouth daily as needed for pain relief.  She discontinued tramadol about a month ago and has been doing fine.  We discussed that coming  off tramadol is a good decision and she should try to take Tylenol as needed pain.  Medication monitoring encounter - UDS & narcotic agreement: 09/08/2021.  Osteopenia of multiple sites - Most  recent DEXA was in 2016.  She is overdue to update DEXA.  Patient states that she will contact her PCP for repeat DEXA scan.  Other medical problems are listed as follows:  Family history of rheumatoid arthritis  History of varicose veins  History of hypertension-blood pressure was 149/93 today.  She was advised to monitor blood pressure closely and follow-up with her PCP.  Abnormal serum angiotensin-converting enzyme level  History of gastroesophageal reflux (GERD)  Orders: No orders of the defined types were placed in this encounter.  No orders of the defined types were placed in this encounter.   Follow-Up Instructions: Return in about 6 months (around 01/14/2023) for Osteoarthritis.   Pollyann Savoy, MD  Note - This record has been created using Animal nutritionist.  Chart creation errors have been sought, but may not always  have been located. Such creation errors do not reflect on  the standard of medical care.

## 2022-07-14 ENCOUNTER — Encounter: Payer: Self-pay | Admitting: Rheumatology

## 2022-07-14 ENCOUNTER — Ambulatory Visit: Payer: Medicare HMO | Attending: Rheumatology | Admitting: Rheumatology

## 2022-07-14 VITALS — BP 149/83 | HR 76 | Resp 15 | Ht 61.0 in | Wt 135.2 lb

## 2022-07-14 DIAGNOSIS — Z8679 Personal history of other diseases of the circulatory system: Secondary | ICD-10-CM

## 2022-07-14 DIAGNOSIS — M5136 Other intervertebral disc degeneration, lumbar region: Secondary | ICD-10-CM

## 2022-07-14 DIAGNOSIS — M8589 Other specified disorders of bone density and structure, multiple sites: Secondary | ICD-10-CM

## 2022-07-14 DIAGNOSIS — Z5181 Encounter for therapeutic drug level monitoring: Secondary | ICD-10-CM

## 2022-07-14 DIAGNOSIS — M5030 Other cervical disc degeneration, unspecified cervical region: Secondary | ICD-10-CM

## 2022-07-14 DIAGNOSIS — M19041 Primary osteoarthritis, right hand: Secondary | ICD-10-CM

## 2022-07-14 DIAGNOSIS — Z8261 Family history of arthritis: Secondary | ICD-10-CM

## 2022-07-14 DIAGNOSIS — M19071 Primary osteoarthritis, right ankle and foot: Secondary | ICD-10-CM | POA: Diagnosis not present

## 2022-07-14 DIAGNOSIS — Z96652 Presence of left artificial knee joint: Secondary | ICD-10-CM | POA: Diagnosis not present

## 2022-07-14 DIAGNOSIS — Z8719 Personal history of other diseases of the digestive system: Secondary | ICD-10-CM

## 2022-07-14 DIAGNOSIS — G8929 Other chronic pain: Secondary | ICD-10-CM

## 2022-07-14 DIAGNOSIS — M19042 Primary osteoarthritis, left hand: Secondary | ICD-10-CM

## 2022-07-14 DIAGNOSIS — M19072 Primary osteoarthritis, left ankle and foot: Secondary | ICD-10-CM

## 2022-07-14 DIAGNOSIS — Z9889 Other specified postprocedural states: Secondary | ICD-10-CM

## 2022-07-14 DIAGNOSIS — R748 Abnormal levels of other serum enzymes: Secondary | ICD-10-CM

## 2022-07-14 DIAGNOSIS — Z96659 Presence of unspecified artificial knee joint: Secondary | ICD-10-CM

## 2022-07-19 ENCOUNTER — Other Ambulatory Visit (INDEPENDENT_AMBULATORY_CARE_PROVIDER_SITE_OTHER): Payer: Medicare HMO

## 2022-07-19 DIAGNOSIS — Z79899 Other long term (current) drug therapy: Secondary | ICD-10-CM | POA: Diagnosis not present

## 2022-07-20 LAB — BASIC METABOLIC PANEL
BUN: 27 mg/dL — ABNORMAL HIGH (ref 6–23)
CO2: 24 mEq/L (ref 19–32)
Calcium: 9.4 mg/dL (ref 8.4–10.5)
Chloride: 100 mEq/L (ref 96–112)
Creatinine, Ser: 0.97 mg/dL (ref 0.40–1.20)
GFR: 56.23 mL/min — ABNORMAL LOW (ref >60.00)
Glucose, Bld: 79 mg/dL (ref 70–99)
Potassium: 4.6 mEq/L (ref 3.5–5.1)
Sodium: 137 mEq/L (ref 135–145)

## 2022-08-03 NOTE — Progress Notes (Signed)
Gfr about the same   slight  improvement  Continue to avoid /limit as possible antiinflammatories . Make sure BP is in a good range  Plan recheck at yearly visit in fall or earlier if needed  Forwarding to dr D as Lorain Childes

## 2022-09-09 ENCOUNTER — Other Ambulatory Visit: Payer: Self-pay | Admitting: Internal Medicine

## 2022-10-19 ENCOUNTER — Telehealth: Payer: Self-pay | Admitting: Internal Medicine

## 2022-10-19 DIAGNOSIS — E2839 Other primary ovarian failure: Secondary | ICD-10-CM

## 2022-10-19 DIAGNOSIS — Z1231 Encounter for screening mammogram for malignant neoplasm of breast: Secondary | ICD-10-CM

## 2022-10-19 NOTE — Telephone Encounter (Addendum)
Pt called to request referrals for Bone Density Test and Mammogram.

## 2022-10-20 NOTE — Telephone Encounter (Signed)
Ok to order  bothe  Dx estrogen deficiency and breast cancer sdcreening

## 2022-10-24 NOTE — Telephone Encounter (Signed)
Orders are placed 

## 2022-10-26 ENCOUNTER — Ambulatory Visit (INDEPENDENT_AMBULATORY_CARE_PROVIDER_SITE_OTHER)
Admission: RE | Admit: 2022-10-26 | Discharge: 2022-10-26 | Disposition: A | Discharge status: Home / Self Care | Payer: Medicare Other | Source: Ambulatory Visit | Attending: Internal Medicine | Admitting: Internal Medicine

## 2022-10-26 DIAGNOSIS — E2839 Other primary ovarian failure: Secondary | ICD-10-CM | POA: Diagnosis not present

## 2022-11-01 ENCOUNTER — Ambulatory Visit: Payer: Medicare Other

## 2022-11-14 NOTE — Progress Notes (Signed)
Dexa low bone density but not osteoporosis  forwarding fyi to Dr Corliss Skains

## 2022-11-15 ENCOUNTER — Encounter: Payer: Self-pay | Admitting: Family Medicine

## 2022-11-15 ENCOUNTER — Ambulatory Visit (INDEPENDENT_AMBULATORY_CARE_PROVIDER_SITE_OTHER): Payer: Medicare Other | Admitting: Family Medicine

## 2022-11-15 VITALS — Ht 61.0 in | Wt 138.0 lb

## 2022-11-15 DIAGNOSIS — Z0000 Encounter for general adult medical examination without abnormal findings: Secondary | ICD-10-CM | POA: Diagnosis not present

## 2022-11-15 NOTE — Patient Instructions (Addendum)
I really enjoyed getting to talk with you today! I am available on Tuesdays and Thursdays for virtual visits if you have any questions or concerns, or if I can be of any further assistance.   CHECKLIST FROM ANNUAL WELLNESS VISIT:  -Follow up (please call to schedule if not scheduled after visit):   -yearly for annual wellness visit with primary care office  Here is a list of your preventive care/health maintenance measures and the plan for each if any are due:  PLAN For any measures below that may be due:    Health Maintenance  Topic Date Due   COVID-19 Vaccine (7 - 2023-24 season) 12/11/2022   Medicare Annual Wellness (AWV)  11/15/2023   DTaP/Tdap/Td (3 - Td or Tdap) 01/07/2024   Pneumonia Vaccine 72+ Years old  Completed   INFLUENZA VACCINE  Completed   DEXA SCAN  Completed   Hepatitis C Screening  Completed   Zoster Vaccines- Shingrix  Completed   HPV VACCINES  Aged Out   Colonoscopy  Discontinued    -See a dentist at least yearly  -Get your eyes checked and then per your eye specialist's recommendations  -Other issues addressed today:  -please limit alcohol to 1 drink (5oz glass of wine) per day. Please schedule appointment if you wish to cut back, but are having difficulty doing so.   -I have included below further information regarding a healthy whole foods based diet, physical activity guidelines for adults, stress management and opportunities for social connections. I hope you find this information useful.   -----------------------------------------------------------------------------------------------------------------------------------------------------------------------------------------------------------------------------------------------------------  NUTRITION: -eat real food: lots of colorful vegetables (half the plate) and fruits -5-7 servings of vegetables and fruits per day (fresh or steamed is best), exp. 2 servings of vegetables with lunch and dinner and 2  servings of fruit per day. Berries and greens such as kale and collards are great choices.  -consume on a regular basis: whole grains (make sure first ingredient on label contains the word "whole"), fresh fruits, fish, nuts, seeds, healthy oils (such as olive oil, avocado oil, grape seed oil) -may eat small amounts of dairy and lean meat on occasion, but avoid processed meats such as ham, bacon, lunch meat, etc. -drink water -try to avoid fast food and pre-packaged foods, processed meat -most experts advise limiting sodium to < 2300mg  per day, should limit further is any chronic conditions such as high blood pressure, heart disease, diabetes, etc. The American Heart Association advised that < 1500mg  is is ideal -try to avoid foods that contain any ingredients with names you do not recognize  -try to avoid sugar/sweets (except for the natural sugar that occurs in fresh fruit) -try to avoid sweet drinks -try to avoid white rice, white bread, pasta (unless whole grain), white or yellow potatoes  EXERCISE GUIDELINES FOR ADULTS: -if you wish to increase your physical activity, do so gradually and with the approval of your doctor -STOP and seek medical care immediately if you have any chest pain, chest discomfort or trouble breathing when starting or increasing exercise  -move and stretch your body, legs, feet and arms when sitting for long periods -Physical activity guidelines for optimal health in adults: -least 150 minutes per week of aerobic exercise (can talk, but not sing) once approved by your doctor, 20-30 minutes of sustained activity or two 10 minute episodes of sustained activity every day.  -resistance training at least 2 days per week if approved by your doctor -balance exercises 3+ days per week:  Stand somewhere where you have something sturdy to hold onto if you lose balance.    1) lift up on toes, start with 5x per day and work up to 20x   2) stand and lift on leg straight out to the  side so that foot is a few inches of the floor, start with 5x each side and work up to 20x each side   3) stand on one foot, start with 5 seconds each side and work up to 20 seconds on each side  If you need ideas or help with getting more active:  -Silver sneakers https://tools.silversneakers.com  -Walk with a Doc: http://www.duncan-williams.com/  -try to include resistance (weight lifting/strength building) and balance exercises twice per week: or the following link for ideas: http://castillo-powell.com/  BuyDucts.dk  STRESS MANAGEMENT: -can try meditating, or just sitting quietly with deep breathing while intentionally relaxing all parts of your body for 5 minutes daily -if you need further help with stress, anxiety or depression please follow up with your primary doctor or contact the wonderful folks at [facility]: [phone]  SOCIAL CONNECTIONS: -options in [place] if you wish to engage in more social and exercise related activities:  -Silver sneakers https://tools.silversneakers.com  -Walk with a Doc: http://www.duncan-williams.com/  -Check out the [facility] Active Adults 50+ section on the [place] [employer] (hiking clubs, book clubs, cards and games, chess, exercise classes, aquatic classes and much more) - see the website for details: https://www.[place].gov/departments/parks-recreation/active-adults50  -YouTube has lots of exercise videos for different ages and abilities as well  -[facility] (a variety of indoor and outdoor inperson activities for adults). [phone]. [address]  -Virtual Online Classes (a variety of topics): see seniorplanet.org or call [phone]  -consider volunteering at a school, hospice center, church, senior center or elsewhere

## 2022-11-15 NOTE — Progress Notes (Signed)
PATIENT CHECK-IN and HEALTH RISK ASSESSMENT QUESTIONNAIRE:  -completed by phone/video for upcoming Medicare Preventive Visit  Pre-Visit Check-in: 1)Vitals (height, wt, BP, etc) - record in vitals section for visit on day of visit Request home vitals (wt, BP, etc.) and enter into vitals, THEN update Vital Signs SmartPhrase below at the top of the HPI. See below.  2)Review and Update Medications, Allergies PMH, Surgeries, Social history in Epic 3)Hospitalizations in the last year with date/reason? No  4)Review and Update Care Team (patient's specialists) in Epic 5) Complete PHQ9 in Epic  6) Complete Fall Screening in Epic 7)Review all Health Maintenance Due and order under PCP if not done.  8)Medicare Wellness Questionnaire: Answer theses question about your habits: Do you drink alcohol? Yes  If yes, how many drinks do you have a day? 2-3 per day when she drinks Have you ever smoked?no Quit date if applicable? na  How many packs a day do/did you smoke? na Do you use smokeless tobacco?no Do you use an illicit drugs?no Do you exercises? Yes IF so, what type and how many days/minutes per week?walk 1 mile and [employer], doing silvers sneaker twice a week with muscle and balance exercise Are you sexually active? No Number of partners? Typical breakfast: toast and fruit Typical lunch: soup and sandwich Typical dinner: casserole salad protein - lots of salads Typical snacks: nuts, cheese and crackers  Beverages: coffee, tea, diet coke, wine  Answer theses question about you: Can you perform most household chores?yes Do you find it hard to follow a conversation in a noisy room? no Do you often ask people to speak up or repeat themselves?no Do you feel that you have a problem with memory? A little  Do you balance your checkbook and or bank acounts? No - husband Do you feel safe at home? yes Last dentist visit? Needs an appointment 1 year Do you need assistance with any of the following: Please  note if so  No  Driving?  Feeding yourself?  Getting from bed to chair?  Getting to the toilet?  Bathing or showering?  Dressing yourself?  Managing money?  Climbing a flight of stairs  Preparing meals?  Do you have Advanced Directives in place (Living Will, Healthcare Power or Attorney)? yes   Last eye Exam and location? 2 years [facility]   Do you currently use prescribed or non-prescribed narcotic or opioid pain medications? no  Do you have a history or close family history of breast, ovarian, tubal or peritoneal cancer or a family member with BRCA (breast cancer susceptibility 1 and 2) gene mutations? no  Request home vitals (wt, BP, etc.) and enter into vitals, THEN update Vital Signs SmartPhrase below at the top of the HPI. See below.   Nurse/Assistant Credentials/time stamp:   ----------------------------------Kern Reap CMA ------------------------------------------------------------------------------------------------------------------------------------------------------------------------------------  Because this visit was a virtual/telehealth visit, some criteria may be missing or patient reported. Any vitals not documented were not able to be obtained and vitals that have been documented are patient reported.    MEDICARE ANNUAL PREVENTIVE VISIT WITH PROVIDER: (Welcome to Medicare, initial annual wellness or annual wellness exam)  Virtual Visit via Video Note  I connected with Briana Giles on 11/15/22 by a video enabled telemedicine application and verified that I am speaking with the correct person using two identifiers.  Location patient: home Location provider:work or home office Persons participating in the virtual visit: patient, provider  Concerns and/or follow up today: doing ok. Does have some OA in  the back and sometimes gets back pain, but otherwise doing well. No weakness, numbness or B/B dysfunction.    See HM section in Epic for other  details of completed HM.    ROS: negative for report of fevers, unintentional weight loss, vision changes, vision loss, hearing loss or change, chest pain, sob, hemoptysis, melena, hematochezia, hematuria, falls, bleeding or bruising, thoughts of suicide or self harm, memory loss  Patient-completed extensive health risk assessment - reviewed and discussed with the patient: See Health Risk Assessment completed with patient prior to the visit either above or in recent phone note. This was reviewed in detailed with the patient today and appropriate recommendations, orders and referrals were placed as needed per Summary below and patient instructions.   Review of Medical History: -PMH, PSH, Family History and current specialty and care providers reviewed and updated and listed below   Patient Care Team: Panosh, Neta Mends, MD as PCP - General Regal, Kirstie Peri, DPM (Podiatry) Pollyann Savoy, MD (Rheumatology) Rachael Fee, MD (Gastroenterology) Elmon Else, MD as Attending Physician (Dermatology) Elliot Dally, MD as Referring Physician (Orthopedic Surgery)   Past Medical History:  Diagnosis Date   Abdominal pain, other specified site    Abnormal LFTs    Allergic rhinitis    Cervical back pain with evidence of disc disease    GERD (gastroesophageal reflux disease)    Heart murmur    says all her life had echo in [place] year ago not to worry   Hypertension    Nerve damage    left arm   Osteoarthritis    Osteopenia    Phlebitis    Spinal stenosis    Transient global amnesia     Past Surgical History:  Procedure Laterality Date   BUNIONECTOMY     fall 2022 Regal   CHOLECYSTECTOMY     CYST EXCISION Right 01/2018   right thumb    FOOT SURGERY      x 2     HAND SURGERY     right hand   KNEE ARTHROSCOPY     right   REPLACEMENT UNICONDYLAR JOINT KNEE     rt knee   TONSILLECTOMY     TOTAL KNEE ARTHROPLASTY Left 06/2019    Social History   Socioeconomic History    Marital status: Married    Spouse name: Not on file   Number of children: 2   Years of education: Not on file   Highest education level: Not on file  Occupational History   Occupation: retired Magazine features editor: RETIRED  Tobacco Use   Smoking status: Never    Passive exposure: Never   Smokeless tobacco: Never  Vaping Use   Vaping status: Never Used  Substance and Sexual Activity   Alcohol use: Yes    Comment: occasional    Drug use: Never   Sexual activity: Not on file  Other Topics Concern   Not on file  Social History Narrative   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  [place] grand child    Does water exercise aerobics walking elliptical    Social Determinants of Health   Financial Resource Strain: Low Risk  (11/11/2021)   Overall Financial Resource Strain (CARDIA)    Difficulty of Paying Living Expenses: Not hard at all  Food Insecurity: No Food Insecurity (11/11/2021)   Hunger Vital Sign  Worried About Programme researcher, broadcasting/film/video in the Last Year: Never true    Ran Out of Food in the Last Year: Never true  Transportation Needs: No Transportation Needs (11/11/2021)   PRAPARE - Administrator, Civil Service (Medical): No    Lack of Transportation (Non-Medical): No  Physical Activity: Sufficiently Active (11/11/2021)   Exercise Vital Sign    Days of Exercise per Week: 4 days    Minutes of Exercise per Session: 60 min  Stress: No Stress Concern Present (11/11/2021)   [employer] of Occupational Health - Occupational Stress Questionnaire    Feeling of Stress : Not at all  Social Connections: Unknown (11/30/2021)   [facility] from [facility], [facility]   Social Network    Social Network: Not on file  Intimate Partner Violence: Unknown (11/30/2021)   Received from [employer], [facility]   HITS    Physically Hurt: Not on file    Insult or Talk Down To: Not on file    Threaten  Physical Harm: Not on file    Scream or Curse: Not on file    Family History  Problem Relation Age of Onset   Hypertension Father    Alcohol abuse Father    Lung cancer Father    Cancer Maternal Grandmother        sinus   COPD Mother    Osteoporosis Mother    Heart disease Mother    Hypertension Brother    Heart Problems Brother    Arthritis Brother    Prostate cancer Maternal Grandfather    Heart attack Brother    Hypertension Brother    Healthy Daughter    Healthy Son    Colon cancer Neg Hx    Pancreatic cancer Neg Hx    Stomach cancer Neg Hx     Current Outpatient Medications on File Prior to Visit  Medication Sig Dispense Refill   acetaminophen (TYLENOL) 500 MG tablet Take 1,000 mg by mouth daily.     fluticasone (FLONASE) 50 MCG/ACT nasal spray USE TWO SPRAYS IN EACH NOSTRIL ONCE DAILY. (Patient taking differently: as needed.) 16 g 5   lisinopril (ZESTRIL) 20 MG tablet TAKE 1 TABLET BY MOUTH EVERY DAY 90 tablet 2   polyethylene glycol powder (MIRALAX) powder Take 17 g by mouth once. (Patient taking differently: Take 17 g by mouth as needed.) 255 g 0   traZODone (DESYREL) 50 MG tablet TAKE 1 TABLET(50 MG) BY MOUTH AT BEDTIME AS NEEDED FOR SLEEP. MAY INCREASE TO 1 AND 1/2 TABLETS 75 MG DOSE 30 tablet 1   No current facility-administered medications on file prior to visit.    Allergies  Allergen Reactions   Codeine Phosphate Other (See Comments)    REACTION: upset stomach       Physical Exam Vitals requested from patient and listed below if patient had equipment and was able to obtain at home for this virtual visit: There were no vitals filed for this visit. Estimated body mass index is 26.07 kg/m as calculated from the following:   Height as of this encounter: 5\' 1"  (1.549 m).   Weight as of this encounter: 138 lb (62.6 kg).  EKG (optional): deferred due to virtual visit  GENERAL: alert, oriented, no acute distress detected, full vision exam deferred due to  pandemic and/or virtual encounter  HEENT: atraumatic, conjunttiva clear, no obvious abnormalities on inspection of external nose and ears  NECK: normal movements of the head and neck  LUNGS:  on inspection no signs of respiratory distress, breathing rate appears normal, no obvious gross SOB, gasping or wheezing  CV: no obvious cyanosis  MS: moves all visible extremities without noticeable abnormality  PSYCH/NEURO: pleasant and cooperative, no obvious depression or anxiety, speech and thought processing grossly intact, Cognitive function grossly intact  Flowsheet Row Office Visit [facility]/15/2022 in [facility]  PHQ-9 Total Score 0           11/15/2022   12:02 PM 12/28/2021    3:11 PM 12/16/2021    1:17 PM 11/11/2021    1:27 PM 02/01/2021   11:04 AM  Depression screen PHQ 2/9  Decreased Interest 0 0 0 0 0  Down, Depressed, Hopeless 0 0 0 0 0  PHQ - 2 Score 0 0 0 0 0  Altered sleeping 0 1 0  0  Tired, decreased energy 0 0 1  0  Change in appetite 0 0 1  0  Feeling bad or failure about yourself  0 0 0  0  Trouble concentrating 0 0 0  0  Moving slowly or fidgety/restless 0 0 0  0  Suicidal thoughts 0  0  0  PHQ-9 Score 0 1 2  0  Difficult doing work/chores  Somewhat difficult Somewhat difficult         02/01/2021   11:04 AM 11/11/2021    1:29 PM 12/16/2021    1:17 PM 12/28/2021    3:12 PM 11/15/2022   12:02 PM  Fall Risk  Falls in the past year? 1 0 0 0 0  Was there an injury with Fall?  0 0 0 0  Fall Risk Category Calculator  0 0 0 0  Fall Risk Category (Retired)  Low Low Low   (RETIRED) Patient Fall Risk Level  Low fall risk Low fall risk Low fall risk   Patient at Risk for Falls Due to  No Fall Risks No Fall Risks No Fall Risks   Fall risk Follow up  Falls prevention discussed Falls evaluation completed Falls evaluation completed Falls evaluation completed     SUMMARY AND PLAN:  Encounter for Medicare annual wellness  exam    Discussed applicable health maintenance/preventive health measures and advised and referred or ordered per patient preferences:  Health Maintenance  Topic Date Due   COVID-19 Vaccine (7 - 2023-24 season) 12/11/2022   Medicare Annual Wellness (AWV)  11/15/2023   DTaP/Tdap/Td (3 - Td or Tdap) 01/07/2024   Pneumonia Vaccine 55+ Years old  Completed   INFLUENZA VACCINE  Completed   DEXA SCAN  Completed   Hepatitis C Screening  Completed   Zoster Vaccines- Shingrix  Completed   HPV VACCINES  Aged Out   Colonoscopy  Discontinued    Education and counseling on the following was provided based on the above review of health and a plan/checklist for the patient, along with additional information discussed, was provided for the patient in the patient instructions :   -Provided safe balance exercises that can be done at home to improve balance and discussed exercise guidelines for adults with include balance exercises at least 3 days per week.  -Advised and counseled on a healthy lifestyle - including the importance of a healthy diet, regular physical activity, social connections and stress management. -Reviewed patient's current diet. Advised and counseled on a whole foods based healthy diet. A summary of a healthy diet was provided in the Patient Instructions.  -reviewed patient's current physical activity level and  discussed exercise guidelines for adults. Discussed community resources and ideas for safe exercise at home to assist in meeting exercise guideline recommendations in a safe and healthy way.  -Advise regular dental visits at minimum and regular eye exams -Advised and counseled on alcohol safe limits, risks, she feels can cut back, advised to limit to 1 x5oz glass of wine per day.   Follow up: see patient instructions     Patient Instructions  I really enjoyed getting to talk with you today! I am available on Tuesdays and Thursdays for virtual visits if you have any  questions or concerns, or if I can be of any further assistance.   CHECKLIST FROM ANNUAL WELLNESS VISIT:  -Follow up (please call to schedule if not scheduled after visit):   -yearly for annual wellness visit with primary care office  Here is a list of your preventive care/health maintenance measures and the plan for each if any are due:  PLAN For any measures below that may be due:    Health Maintenance  Topic Date Due   COVID-19 Vaccine (7 - 2023-24 season) 12/11/2022   Medicare Annual Wellness (AWV)  11/15/2023   DTaP/Tdap/Td (3 - Td or Tdap) 01/07/2024   Pneumonia Vaccine 52+ Years old  Completed   INFLUENZA VACCINE  Completed   DEXA SCAN  Completed   Hepatitis C Screening  Completed   Zoster Vaccines- Shingrix  Completed   HPV VACCINES  Aged Out   Colonoscopy  Discontinued    -See a dentist at least yearly  -Get your eyes checked and then per your eye specialist's recommendations  -Other issues addressed today:  -please limit alcohol to 1 drink (5oz glass of wine) per day. Please schedule appointment if you wish to cut back, but are having difficulty doing so.   -I have included below further information regarding a healthy whole foods based diet, physical activity guidelines for adults, stress management and opportunities for social connections. I hope you find this information useful.   -----------------------------------------------------------------------------------------------------------------------------------------------------------------------------------------------------------------------------------------------------------  NUTRITION: -eat real food: lots of colorful vegetables (half the plate) and fruits -5-7 servings of vegetables and fruits per day (fresh or steamed is best), exp. 2 servings of vegetables with lunch and dinner and 2 servings of fruit per day. Berries and greens such as kale and collards are great choices.  -consume on a regular basis:  whole grains (make sure first ingredient on label contains the word "whole"), fresh fruits, fish, nuts, seeds, healthy oils (such as olive oil, avocado oil, grape seed oil) -may eat small amounts of dairy and lean meat on occasion, but avoid processed meats such as ham, bacon, lunch meat, etc. -drink water -try to avoid fast food and pre-packaged foods, processed meat -most experts advise limiting sodium to < 2300mg  per day, should limit further is any chronic conditions such as high blood pressure, heart disease, diabetes, etc. The American Heart Association advised that < 1500mg  is is ideal -try to avoid foods that contain any ingredients with names you do not recognize  -try to avoid sugar/sweets (except for the natural sugar that occurs in fresh fruit) -try to avoid sweet drinks -try to avoid white rice, white bread, pasta (unless whole grain), white or yellow potatoes  EXERCISE GUIDELINES FOR ADULTS: -if you wish to increase your physical activity, do so gradually and with the approval of your doctor -STOP and seek medical care immediately if you have any chest pain, chest discomfort or trouble breathing when starting or increasing exercise  -  move and stretch your body, legs, feet and arms when sitting for long periods -Physical activity guidelines for optimal health in adults: -least 150 minutes per week of aerobic exercise (can talk, but not sing) once approved by your doctor, 20-30 minutes of sustained activity or two 10 minute episodes of sustained activity every day.  -resistance training at least 2 days per week if approved by your doctor -balance exercises 3+ days per week:   Stand somewhere where you have something sturdy to hold onto if you lose balance.    1) lift up on toes, start with 5x per day and work up to 20x   2) stand and lift on leg straight out to the side so that foot is a few inches of the floor, start with 5x each side and work up to 20x each side   3) stand on one  foot, start with 5 seconds each side and work up to 20 seconds on each side  If you need ideas or help with getting more active:  -Silver sneakers https://tools.silversneakers.com  -Walk with a Doc: http://www.duncan-williams.com/  -try to include resistance (weight lifting/strength building) and balance exercises twice per week: or the following link for ideas: http://castillo-powell.com/  BuyDucts.dk  STRESS MANAGEMENT: -can try meditating, or just sitting quietly with deep breathing while intentionally relaxing all parts of your body for 5 minutes daily -if you need further help with stress, anxiety or depression please follow up with your primary doctor or contact the wonderful folks at [facility]: [phone]  SOCIAL CONNECTIONS: -options in [place] if you wish to engage in more social and exercise related activities:  -Silver sneakers https://tools.silversneakers.com  -Walk with a Doc: http://www.duncan-williams.com/  -Check out the [facility] Active Adults 50+ section on the [place] [employer] (hiking clubs, book clubs, cards and games, chess, exercise classes, aquatic classes and much more) - see the website for details: https://www.[place].gov/departments/parks-recreation/active-adults50  -YouTube has lots of exercise videos for different ages and abilities as well  -[facility] (a variety of indoor and outdoor inperson activities for adults). ([phone]. [address]  -Virtual Online Classes (a variety of topics): see seniorplanet.org or call [phone]  -consider volunteering at a school, hospice center, church, senior center or elsewhere           Terressa Koyanagi, DO

## 2022-11-15 NOTE — Progress Notes (Signed)
 Per patient no change in vitals since last visit, unable to obtain new vitals due to telehealth visit

## 2022-11-22 ENCOUNTER — Ambulatory Visit
Admission: RE | Admit: 2022-11-22 | Discharge: 2022-11-22 | Disposition: A | Discharge status: Home / Self Care | Payer: Medicare Other | Source: Ambulatory Visit | Attending: Internal Medicine | Admitting: Internal Medicine

## 2022-11-22 DIAGNOSIS — Z1231 Encounter for screening mammogram for malignant neoplasm of breast: Secondary | ICD-10-CM

## 2022-11-25 ENCOUNTER — Other Ambulatory Visit: Payer: Self-pay | Admitting: Internal Medicine

## 2022-11-25 DIAGNOSIS — R928 Other abnormal and inconclusive findings on diagnostic imaging of breast: Secondary | ICD-10-CM

## 2022-12-06 ENCOUNTER — Other Ambulatory Visit: Payer: Self-pay | Admitting: Internal Medicine

## 2022-12-06 ENCOUNTER — Ambulatory Visit
Admission: RE | Admit: 2022-12-06 | Discharge: 2022-12-06 | Disposition: A | Discharge status: Home / Self Care | Payer: Medicare Other | Source: Ambulatory Visit | Attending: Internal Medicine | Admitting: Internal Medicine

## 2022-12-06 DIAGNOSIS — N6489 Other specified disorders of breast: Secondary | ICD-10-CM

## 2022-12-06 DIAGNOSIS — R928 Other abnormal and inconclusive findings on diagnostic imaging of breast: Secondary | ICD-10-CM

## 2022-12-07 ENCOUNTER — Ambulatory Visit
Admission: RE | Admit: 2022-12-07 | Discharge: 2022-12-07 | Disposition: A | Discharge status: Home / Self Care | Payer: Medicare Other | Source: Ambulatory Visit | Attending: Internal Medicine | Admitting: Internal Medicine

## 2022-12-07 DIAGNOSIS — N6489 Other specified disorders of breast: Secondary | ICD-10-CM

## 2022-12-07 DIAGNOSIS — N6311 Unspecified lump in the right breast, upper outer quadrant: Secondary | ICD-10-CM

## 2022-12-07 HISTORY — PX: BREAST BIOPSY: SHX20

## 2022-12-09 ENCOUNTER — Telehealth: Payer: Self-pay | Admitting: Hematology

## 2022-12-09 LAB — SURGICAL PATHOLOGY

## 2022-12-09 NOTE — Telephone Encounter (Signed)
Spoke to patient to confirm upcoming afternoon [facility] clinic appointment on 10/30, paperwork will be sent via e-mail.   Gave location and time, also informed patient that the surgeon's office would be calling as well to get information from them similar to the packet that they will be receiving so make sure to do both.  Reminded patient that all providers will be coming to the clinic to see them HERE and if they had any questions to not hesitate to reach back out to myself or their navigators.

## 2022-12-12 ENCOUNTER — Encounter: Payer: Self-pay | Admitting: *Deleted

## 2022-12-12 DIAGNOSIS — C50411 Malignant neoplasm of upper-outer quadrant of right female breast: Secondary | ICD-10-CM | POA: Insufficient documentation

## 2022-12-12 NOTE — Progress Notes (Signed)
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   HISTORY OF PRESENT ILLNESS: Briana Giles is a 77 y.o. female seen in the multidisciplinary breast clinic for a new diagnosis of right breast cancer. The patient was noted to have a screening detected subtle distortion on her mammogram on 11/22/2022.  She returned for diagnostic mammography on 12/06/2022 which showed a persistent architectural distortion in the upper breast posterior depth at or near the 12 o'clock position.  No underlying mass or specific calcifications were otherwise noted.  That day she also had a ultrasound which identified a mass in the 11 o'clock position measuring 1.8 cm in greatest dimension corresponding to the screening mammographic finding.  Ultrasound of the right axilla was negative for adenopathy.  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  She is seen today to discuss treatment recommendations.Marland Kitchen    PREVIOUS RADIATION THERAPY: {EXAM; YES/NO:19492::"No"}   PAST MEDICAL HISTORY:  Past Medical History:  Diagnosis Date   Abdominal pain, other specified site    Abnormal LFTs    Allergic rhinitis    Cervical back pain with evidence of disc disease    GERD (gastroesophageal reflux disease)    Heart murmur    says all her life had [place] in [place] year ago not to worry   Hypertension    Nerve damage    left arm   Osteoarthritis    Osteopenia    Phlebitis    Spinal stenosis    Transient global amnesia        PAST SURGICAL HISTORY: Past Surgical History:  Procedure Laterality Date   BREAST BIOPSY Right 12/07/2022    [place] RT BREAST BX W LOC DEV 1ST LESION IMG BX SPEC US GUIDE 12/07/2022 [facility]   BUNIONECTOMY     fall 2022 [facility]   CHOLECYSTECTOMY     CYST EXCISION Right 01/2018   right thumb    FOOT SURGERY      x 2     HAND SURGERY     right hand   KNEE ARTHROSCOPY     right   REPLACEMENT UNICONDYLAR JOINT KNEE     rt knee   TONSILLECTOMY     TOTAL KNEE ARTHROPLASTY Left 06/2019     FAMILY HISTORY:  Family History  Problem Relation Age of Onset   Hypertension Father    Alcohol abuse Father    Lung cancer Father    Cancer Maternal Grandmother        sinus   COPD Mother    Osteoporosis Mother    Heart disease Mother    Hypertension Brother    Heart Problems Brother    Arthritis Brother    Prostate cancer Maternal Grandfather    Heart attack Brother    Hypertension Brother    Healthy Daughter    Healthy Son    Colon cancer Neg Hx    Pancreatic cancer Neg Hx    Stomach cancer Neg Hx      SOCIAL HISTORY:  reports that she has never smoked. She has never  been exposed to tobacco smoke. She has never used smokeless tobacco. She reports current alcohol use. She reports that she does not use drugs.  The patient is married and lives in [place].  She***   ALLERGIES: Codeine phosphate   MEDICATIONS:  Current Outpatient Medications  Medication Sig Dispense Refill   acetaminophen (TYLENOL) 500 MG tablet Take 1,000 mg by mouth daily.     fluticasone (FLONASE) 50 MCG/ACT nasal spray USE TWO SPRAYS IN EACH NOSTRIL ONCE DAILY. (Patient taking differently: as needed.) 16 g 5   lisinopril (ZESTRIL) 20 MG tablet TAKE 1 TABLET BY MOUTH EVERY DAY 90 tablet 2   polyethylene glycol powder (MIRALAX) powder Take 17 g by mouth once. (Patient taking differently: Take 17 g by mouth as needed.) 255 g 0   traZODone (DESYREL) 50 MG tablet TAKE 1 TABLET(50 MG) BY MOUTH AT BEDTIME AS NEEDED FOR SLEEP. MAY INCREASE TO 1 AND 1/2 TABLETS 75 MG DOSE 30 tablet 1   No current  facility-administered medications for this visit.     REVIEW OF SYSTEMS: On review of systems, the patient reports that she is doing ***     PHYSICAL EXAM:  Wt Readings from Last 3 Encounters:  11/15/22 138 lb (62.6 kg)  07/14/22 135 lb 3.2 oz (61.3 kg)  12/28/21 136 lb 9.6 oz (62 kg)   Temp Readings from Last 3 Encounters:  12/28/21 (!) 97.5 F (36.4 C) (Oral)  12/16/21 98.6 F (37 C) (Oral)  12/01/21 98.4 F (36.9 C)   BP Readings from Last 3 Encounters:  07/14/22 (!) 149/83  12/28/21 128/60  12/16/21 124/86   Pulse Readings from Last 3 Encounters:  07/14/22 76  12/28/21 80  12/16/21 80    In general this is a well appearing *** female in no acute distress. She's alert and oriented x4 and appropriate throughout the examination. Cardiopulmonary assessment is negative for acute distress and she exhibits normal effort. Bilateral breast exam is deferred.    ECOG = ***  0 - Asymptomatic (Fully active, able to carry on all predisease activities without restriction)  1 - Symptomatic but completely ambulatory (Restricted in physically strenuous activity but ambulatory and able to carry out work of a light or sedentary nature. For example, light housework, office work)  2 - Symptomatic, <50% in bed during the day (Ambulatory and capable of all self care but unable to carry out any work activities. Up and about more than 50% of waking hours)  3 - Symptomatic, >50% in bed, but not bedbound (Capable of only limited self-care, confined to bed or chair 50% or more of waking hours)  4 - Bedbound (Completely disabled. Cannot carry on any self-care. Totally confined to bed or chair)  5 - Death   Santiago Glad MM, Creech RH, Tormey DC, et al. [phone]). "Toxicity and response criteria of the [facility]". Am. Evlyn Clines. Oncol. 5 (6): 649-55    LABORATORY DATA:  Lab Results  Component Value Date   WBC 5.6 12/28/2021   HGB 14.3 12/28/2021   HCT 42.3 12/28/2021    MCV 99.0 12/28/2021   PLT 259.0 12/28/2021   Lab Results  Component Value Date   NA 137 07/19/2022   K 4.6 07/19/2022   CL 100 07/19/2022   CO2 24 07/19/2022   Lab Results  Component Value Date   ALT 23 12/28/2021   AST 27 12/28/2021   ALKPHOS 113 12/28/2021   BILITOT 0.4 12/28/2021      RADIOGRAPHY: [place]  RT BREAST BX W LOC DEV 1ST LESION IMG BX SPEC US GUIDE  Addendum Date: 12/09/2022   ADDENDUM REPORT: 12/09/2022 12:27 ADDENDUM: Pathology revealed GRADE II INVASIVE DUCTAL CARCINOMA, OTHER FINDINGS: FIBROCYSTIC CHANGE, ATYPICAL LOBULAR HYPERPLASIA of the RIGHT breast, 11 o'clock, (ribbon clip). This was found to be concordant by Dr. Laveda Abbe. Pathology results were discussed with the patient by telephone. The patient reported doing well after the biopsy with minimal tenderness at the site. Post biopsy instructions and care were reviewed and questions were answered. The patient was encouraged to call [facility] for any additional concerns. My direct phone number was provided. The patient was referred to [facility] on December 14, 2022. Pathology results reported by Rene Kocher, RN on 12/09/2022. Electronically Signed   By: Harmon Pier M.D.   On: 12/09/2022 12:27   Result Date: 12/09/2022 CLINICAL DATA:  77 year old female presents for tissue sampling of UPPER OUTER RIGHT breast mass. EXAM: ULTRASOUND GUIDED RIGHT BREAST CORE NEEDLE BIOPSY COMPARISON:  Previous exam(s). PROCEDURE: I met with the patient and we discussed the procedure of ultrasound-guided biopsy, including benefits and alternatives. We discussed the high likelihood of a successful procedure. We discussed the risks of the procedure, including infection, bleeding, tissue injury, clip migration, and inadequate sampling. Informed written consent was given. The usual time-out protocol was performed immediately prior to the  procedure. Lesion quadrant: UPPER-OUTER RIGHT breast Using sterile technique and 1% Lidocaine as local anesthetic, under direct ultrasound visualization, a 12 gauge spring-loaded device was used to perform biopsy of the 1.5 cm UPPER-OUTER RIGHT breast mass at the 11 o'clock position using a LATERAL approach. At the conclusion of the procedure a RIBBON shaped tissue marker clip was deployed into the biopsy cavity. Follow up 2 view mammogram was performed and dictated separately. IMPRESSION: Ultrasound guided biopsy of 1.5 cm UPPER-OUTER RIGHT breast mass. No apparent complications. Electronically Signed: By: Harmon Pier M.D. On: 12/07/2022 12:05   MM CLIP PLACEMENT RIGHT  Result Date: 12/07/2022 CLINICAL DATA:  Evaluate RIBBON biopsy clip placement following ultrasound-guided RIGHT breast biopsy. EXAM: 3D DIAGNOSTIC RIGHT MAMMOGRAM POST ULTRASOUND BIOPSY COMPARISON:  Previous exam(s). FINDINGS: 3D Mammographic images were obtained following ultrasound guided biopsy of the 1.5 cm 11 o'clock position RIGHT breast mass. The biopsy marking clip is in expected position at the site of biopsy. IMPRESSION: Appropriate positioning of the RIBBON shaped biopsy marking clip at the site of biopsy in the UPPER OUTER RIGHT breast. Final Assessment: Post Procedure Mammograms for Marker Placement Electronically Signed   By: Harmon Pier M.D.   On: 12/07/2022 12:16   MM 3D DIAGNOSTIC MAMMOGRAM UNILATERAL RIGHT BREAST  Result Date: 12/06/2022 CLINICAL DATA:  Recall from screening mammography, possible distortion involving the upper RIGHT breast at posterior depth. EXAM: DIGITAL DIAGNOSTIC UNILATERAL RIGHT MAMMOGRAM WITH TOMOSYNTHESIS AND CAD; ULTRASOUND RIGHT BREAST LIMITED TECHNIQUE: Right digital diagnostic mammography and breast tomosynthesis was performed. The images were evaluated with computer-aided detection. ; Targeted ultrasound examination of the right breast was performed COMPARISON:  Previous exam(s). ACR Breast  Density Category c: The breasts are heterogeneously dense, which may obscure small masses. FINDINGS: Spot compression CC and MLO views of the area of concern were obtained. Persistent architectural distortion in the upper breast at posterior depth on the spot compression views, at or near 12 o'clock. No underlying mass and no suspicious calcifications. Targeted ultrasound is performed, demonstrating an irregular hypoechoic mass with vague  and angular margins at 11 o'clock 5 cm from the nipple measuring approximately 1.8 x 0.8 x 1.6 cm, demonstrating posterior acoustic shadowing and no internal power Doppler flow, associated with distortion, corresponding to the screening mammographic finding. Sonographic evaluation of the RIGHT axilla demonstrates no pathologic lymphadenopathy. IMPRESSION: 1. Highly suspicious 1.5 cm mass in the UPPER OUTER QUADRANT of the RIGHT breast at 11 o'clock 5 cm from the nipple. 2. No pathologic RIGHT axillary lymphadenopathy. RECOMMENDATION: Ultrasound-guided core needle biopsy of the RIGHT breast mass. I have discussed the findings and recommendations with the patient. The ultrasound core needle biopsy procedure was discussed with the patient and her questions were answered. She wishes to proceed with the biopsy which will be scheduled by the Breast Imaging staff. BI-RADS CATEGORY  5: Highly suggestive of malignancy. Electronically Signed   By: Hulan Saas M.D.   On: 12/06/2022 15:07   [place] LIMITED ULTRASOUND INCLUDING AXILLA RIGHT BREAST  Result Date: 12/06/2022 CLINICAL DATA:  Recall from screening mammography, possible distortion involving the upper RIGHT breast at posterior depth. EXAM: DIGITAL DIAGNOSTIC UNILATERAL RIGHT MAMMOGRAM WITH TOMOSYNTHESIS AND CAD; ULTRASOUND RIGHT BREAST LIMITED TECHNIQUE: Right digital diagnostic mammography and breast tomosynthesis was performed. The images were evaluated with computer-aided detection. ; Targeted ultrasound examination of the  right breast was performed COMPARISON:  Previous exam(s). ACR Breast Density Category c: The breasts are heterogeneously dense, which may obscure small masses. FINDINGS: Spot compression CC and MLO views of the area of concern were obtained. Persistent architectural distortion in the upper breast at posterior depth on the spot compression views, at or near 12 o'clock. No underlying mass and no suspicious calcifications. Targeted ultrasound is performed, demonstrating an irregular hypoechoic mass with vague and angular margins at 11 o'clock 5 cm from the nipple measuring approximately 1.8 x 0.8 x 1.6 cm, demonstrating posterior acoustic shadowing and no internal power Doppler flow, associated with distortion, corresponding to the screening mammographic finding. Sonographic evaluation of the RIGHT axilla demonstrates no pathologic lymphadenopathy. IMPRESSION: 1. Highly suspicious 1.5 cm mass in the UPPER OUTER QUADRANT of the RIGHT breast at 11 o'clock 5 cm from the nipple. 2. No pathologic RIGHT axillary lymphadenopathy. RECOMMENDATION: Ultrasound-guided core needle biopsy of the RIGHT breast mass. I have discussed the findings and recommendations with the patient. The ultrasound core needle biopsy procedure was discussed with the patient and her questions were answered. She wishes to proceed with the biopsy which will be scheduled by the Breast Imaging staff. BI-RADS CATEGORY  5: Highly suggestive of malignancy. Electronically Signed   By: Hulan Saas M.D.   On: 12/06/2022 15:07   MM 3D SCREENING MAMMOGRAM BILATERAL BREAST  Result Date: 11/24/2022 CLINICAL DATA:  Screening. EXAM: DIGITAL SCREENING BILATERAL MAMMOGRAM WITH TOMOSYNTHESIS AND CAD TECHNIQUE: Bilateral screening digital craniocaudal and mediolateral oblique mammograms were obtained. Bilateral screening digital breast tomosynthesis was performed. The images were evaluated with computer-aided detection. COMPARISON:  Previous exam(s). ACR Breast  Density Category d: The breasts are extremely dense, which lowers the sensitivity of mammography. FINDINGS: In the right breast, possible subtle distortion warrants further evaluation. In the left breast, no findings suspicious for malignancy. IMPRESSION: Further evaluation is suggested for possible subtle distortion in the right breast. RECOMMENDATION: Diagnostic mammogram and possibly ultrasound of the right breast. (Code:[id number]) The patient will be contacted regarding the findings, and additional imaging will be scheduled. BI-RADS CATEGORY  0: Incomplete: Need additional imaging evaluation. Electronically Signed   By: Hulan Saas M.D.   On: 11/24/2022 14:22  IMPRESSION/PLAN: 1. Stage IA, [id number], grade 2, ***invasive ductal carcinoma of the right breast.Dr. Mitzi Hansen discusses the pathology findings and reviews the nature of *** breast disease. The consensus from the breast conference includes breast conservation with lumpectomy with *** sentinel node biopsy. Depending on the size of the final tumor measurements rendered by pathology, the tumor may be tested for Oncotype Dx score to determine a role for systemic therapy. Provided that chemotherapy is not indicated, the patient's course would then be followed by external radiotherapy to the breast  to reduce risks of local recurrence followed by antiestrogen therapy. We discussed the risks, benefits, short, and long term effects of radiotherapy, as well as the curative intent, and the patient is interested in proceeding. Dr. Mitzi Hansen discusses the delivery and logistics of radiotherapy and anticipates a course of *** weeks of radiotherapy. We will see her back a few weeks after surgery to discuss the simulation process and anticipate we starting radiotherapy about 4-6 weeks after surgery.  2. Possible genetic predisposition to malignancy. The patient is a candidate for genetic testing given *** personal and family history. She will meet with our  geneticist today in clinic.   In a visit lasting *** minutes, greater than 50% of the time was spent face to face reviewing her case, as well as in preparation of, discussing, and coordinating the patient's care.  The above documentation reflects my direct findings during this shared patient visit. Please see the separate note by Dr. Mitzi Hansen on this date for the remainder of the patient's plan of care.    Osker Mason, [facility]    **Disclaimer: This note was dictated with voice recognition software. Similar sounding words can inadvertently be transcribed and this note may contain transcription errors which may not have been corrected upon publication of note.**

## 2022-12-13 ENCOUNTER — Other Ambulatory Visit: Payer: Self-pay | Admitting: *Deleted

## 2022-12-13 DIAGNOSIS — C50411 Malignant neoplasm of upper-outer quadrant of right female breast: Secondary | ICD-10-CM

## 2022-12-14 ENCOUNTER — Ambulatory Visit
Admission: RE | Admit: 2022-12-14 | Discharge: 2022-12-14 | Disposition: A | Discharge status: Home / Self Care | Payer: Medicare Other | Source: Ambulatory Visit | Attending: Radiation Oncology | Admitting: Radiation Oncology

## 2022-12-14 ENCOUNTER — Encounter: Payer: Self-pay | Admitting: *Deleted

## 2022-12-14 ENCOUNTER — Inpatient Hospital Stay: Payer: Medicare Other | Attending: Hematology | Admitting: Hematology

## 2022-12-14 ENCOUNTER — Ambulatory Visit: Payer: Self-pay | Admitting: General Surgery

## 2022-12-14 ENCOUNTER — Ambulatory Visit: Payer: Medicare Other | Admitting: Physical Therapy

## 2022-12-14 ENCOUNTER — Other Ambulatory Visit: Payer: Medicare Other

## 2022-12-14 ENCOUNTER — Encounter: Payer: Self-pay | Admitting: General Practice

## 2022-12-14 ENCOUNTER — Encounter: Payer: Self-pay | Admitting: Genetic Counselor

## 2022-12-14 ENCOUNTER — Inpatient Hospital Stay: Payer: Medicare Other

## 2022-12-14 ENCOUNTER — Encounter: Payer: Self-pay | Admitting: Hematology

## 2022-12-14 VITALS — BP 162/65 | HR 83 | Temp 97.5°F | Resp 18 | Ht 61.0 in | Wt 141.3 lb

## 2022-12-14 DIAGNOSIS — M170 Bilateral primary osteoarthritis of knee: Secondary | ICD-10-CM | POA: Diagnosis not present

## 2022-12-14 DIAGNOSIS — Z170 Estrogen receptor positive status [ER+]: Secondary | ICD-10-CM | POA: Insufficient documentation

## 2022-12-14 DIAGNOSIS — C50411 Malignant neoplasm of upper-outer quadrant of right female breast: Secondary | ICD-10-CM

## 2022-12-14 DIAGNOSIS — I10 Essential (primary) hypertension: Secondary | ICD-10-CM | POA: Diagnosis not present

## 2022-12-14 DIAGNOSIS — M8580 Other specified disorders of bone density and structure, unspecified site: Secondary | ICD-10-CM | POA: Insufficient documentation

## 2022-12-14 LAB — CMP (CANCER CENTER ONLY)
ALT: 23 U/L (ref 0–44)
AST: 26 U/L (ref 15–41)
Albumin: 4.5 g/dL (ref 3.5–5.0)
Alkaline Phosphatase: 99 U/L (ref 38–126)
Anion gap: 7 (ref 5–15)
BUN: 24 mg/dL — ABNORMAL HIGH (ref 8–23)
CO2: 28 mmol/L (ref 22–32)
Calcium: 10 mg/dL (ref 8.9–10.3)
Chloride: 104 mmol/L (ref 98–111)
Creatinine: 0.92 mg/dL (ref 0.44–1.00)
GFR, Estimated: >60 mL/min (ref >60)
Glucose, Bld: 90 mg/dL (ref 70–99)
Potassium: 4.7 mmol/L (ref 3.5–5.1)
Sodium: 139 mmol/L (ref 135–145)
Total Bilirubin: 0.6 mg/dL (ref 0.3–1.2)
Total Protein: 7.7 g/dL (ref 6.5–8.1)

## 2022-12-14 LAB — CBC WITH DIFFERENTIAL (CANCER CENTER ONLY)
Abs Immature Granulocytes: 0.03 10*3/uL (ref 0.00–0.07)
Basophils Absolute: 0.1 10*3/uL (ref 0.0–0.1)
Basophils Relative: 1 %
Eosinophils Absolute: 0.2 10*3/uL (ref 0.0–0.5)
Eosinophils Relative: 3 %
HCT: 43 % (ref 36.0–46.0)
Hemoglobin: 14.4 g/dL (ref 12.0–15.0)
Immature Granulocytes: 1 %
Lymphocytes Relative: 19 %
Lymphs Abs: 1.1 10*3/uL (ref 0.7–4.0)
MCH: 32.4 pg (ref 26.0–34.0)
MCHC: 33.5 g/dL (ref 30.0–36.0)
MCV: 96.8 fL (ref 80.0–100.0)
Monocytes Absolute: 0.4 10*3/uL (ref 0.1–1.0)
Monocytes Relative: 8 %
Neutro Abs: 3.9 10*3/uL (ref 1.7–7.7)
Neutrophils Relative %: 68 %
Platelet Count: 261 10*3/uL (ref 150–400)
RBC: 4.44 MIL/uL (ref 3.87–5.11)
RDW: 12.9 % (ref 11.5–15.5)
WBC Count: 5.7 10*3/uL (ref 4.0–10.5)
nRBC: 0 % (ref 0.0–0.2)

## 2022-12-14 LAB — GENETIC SCREENING ORDER

## 2022-12-14 MED ORDER — KETOROLAC TROMETHAMINE 15 MG/ML IJ SOLN: 15.0000 mg | INTRAMUSCULAR | Status: AC

## 2022-12-14 NOTE — Progress Notes (Signed)
[facility]   [phone] [phone]   Clinic New Consult Note   Patient Care Team: Panosh, Neta Mends, MD as PCP - General Regal, Kirstie Peri, DPM (Podiatry) Pollyann Savoy, MD (Rheumatology) Rachael Fee, MD (Gastroenterology) Elmon Else, MD as Attending Physician (Dermatology) Elliot Dally, MD as Referring Physician (Orthopedic Surgery) Donnelly Angelica, RN as Oncology Nurse Navigator Pershing Proud, RN as Oncology Nurse Navigator Griselda Miner, MD as Consulting Physician (General Surgery) Malachy Mood, MD as Consulting Physician (Hematology) Dorothy Puffer, MD as Consulting Physician (Radiation Oncology) 12/14/2022  CHIEF COMPLAINTS/PURPOSE OF CONSULTATION:  Newly diagnosed right breast cancer  Discussed the use of AI scribe software for clinical note transcription with the patient, who gave verbal consent to proceed.  History of Present Illness   The patient, a 77 year old with a history of high blood pressure, arthritis, osteopenia, and multiple surgeries, presents with a recent diagnosis right breast cancer.  Patient presented to the multidisciplinary clinic with her family.    Her breast cancer was discovered on screening mammogram on November 24, 2022, which showed possible subtle distortion in the right breast.  She underwent a diagnostic mammogram and ultrasound of right breast subsequently, which showed a 1.5 cm mass in the upper outer quadrant of the right breast, at 11:00 5 cm from nipple.  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   The patient reports no other symptoms or discomfort, except for some soreness in the breast after the biopsy. The patient's arthritis is moderate, affecting the back and knees, and is managed with daily medication. The patient's high blood pressure is controlled with lisinopril. The patient used to have acid reflux but reports it is no  longer a problem. The patient also has a history of osteopenia but reports no symptoms and is not on any medication for it. The patient has had multiple surgeries in the past, including a gallbladder removal, foot surgery, hand surgery, and a knee replacement. The patient is active, walking for an hour to an hour and a half daily. The patient also drinks two glasses of wine every evening.         MEDICAL HISTORY:  Past Medical History:  Diagnosis Date   Abdominal pain, other specified site    Abnormal LFTs    Allergic rhinitis    Breast cancer (HCC)    Cervical back pain with evidence of disc disease    GERD (gastroesophageal reflux disease)    Heart murmur    says all her life had [place] in [place] year ago not to worry   Hypertension    Nerve damage    left arm   Osteoarthritis    Osteopenia    Phlebitis    Spinal stenosis    Transient global amnesia     SURGICAL HISTORY: Past Surgical History:  Procedure Laterality Date   BREAST BIOPSY Right 12/07/2022   [place] RT BREAST BX W LOC DEV 1ST LESION IMG BX SPEC US GUIDE 12/07/2022 [facility]   BUNIONECTOMY     fall 2022 Regal   CHOLECYSTECTOMY     CYST EXCISION Right 01/2018   right thumb    FOOT SURGERY      x 2     HAND SURGERY     right hand   KNEE ARTHROSCOPY     right   REPLACEMENT UNICONDYLAR JOINT KNEE     rt knee   TONSILLECTOMY  TOTAL KNEE ARTHROPLASTY Left 06/2019    SOCIAL HISTORY: Social History   Socioeconomic History   Marital status: Married    Spouse name: Not on file   Number of children: 2   Years of education: Not on file   Highest education level: Not on file  Occupational History   Occupation: retired Magazine features editor: RETIRED  Tobacco Use   Smoking status: Never    Passive exposure: Never   Smokeless tobacco: Never  Vaping Use   Vaping status: Never Used  Substance and Sexual Activity   Alcohol use: Yes    Alcohol/week: 14.0 standard drinks of alcohol    Types: 14  Glasses of wine per week    Comment: occasional    Drug use: Never   Sexual activity: Not on file  Other Topics Concern   Not on file  Social History Narrative   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  [place] grand child    Does water exercise aerobics walking elliptical    Social Determinants of Health   Financial Resource Strain: Low Risk  (11/11/2021)   Overall Financial Resource Strain (CARDIA)    Difficulty of Paying Living Expenses: Not hard at all  Food Insecurity: No Food Insecurity (11/11/2021)   Hunger Vital Sign    Worried About Running Out of Food in the Last Year: Never true    Ran Out of Food in the Last Year: Never true  Transportation Needs: No Transportation Needs (11/11/2021)   PRAPARE - Administrator, Civil Service (Medical): No    Lack of Transportation (Non-Medical): No  Physical Activity: Sufficiently Active (11/11/2021)   Exercise Vital Sign    Days of Exercise per Week: 4 days    Minutes of Exercise per Session: 60 min  Stress: No Stress Concern Present (11/11/2021)   [employer] of Occupational Health - Occupational Stress Questionnaire    Feeling of Stress : Not at all  Social Connections: Unknown (11/30/2021)   Received from [facility], [facility]   Social Network    Social Network: Not on file  Intimate Partner Violence: Unknown (11/30/2021)   Received from [employer], [facility]   HITS    Physically Hurt: Not on file    Insult or Talk Down To: Not on file    Threaten Physical Harm: Not on file    Scream or Curse: Not on file    FAMILY HISTORY: Family History  Problem Relation Age of Onset   COPD Mother    Osteoporosis Mother    Heart disease Mother    Hypertension Father    Alcohol abuse Father    Lung cancer Father        smoking hx   Hypertension Brother    Heart Problems Brother    Arthritis Brother    Heart attack Brother     Hypertension Brother    Cancer Maternal Grandmother        sinus   Prostate cancer Maternal Grandfather        no mets; dx <80   Healthy Daughter    Healthy Son    Leukemia Maternal Uncle        d. 8   Colon cancer Neg Hx    Pancreatic cancer Neg Hx    Stomach cancer Neg Hx     ALLERGIES:  is allergic to codeine  phosphate.  MEDICATIONS:  Current Outpatient Medications  Medication Sig Dispense Refill   acetaminophen (TYLENOL) 500 MG tablet Take 1,000 mg by mouth daily.     fluticasone (FLONASE) 50 MCG/ACT nasal spray USE TWO SPRAYS IN EACH NOSTRIL ONCE DAILY. (Patient taking differently: as needed.) 16 g 5   lisinopril (ZESTRIL) 20 MG tablet TAKE 1 TABLET BY MOUTH EVERY DAY 90 tablet 2   polyethylene glycol powder (MIRALAX) powder Take 17 g by mouth once. (Patient taking differently: Take 17 g by mouth as needed.) 255 g 0   traZODone (DESYREL) 50 MG tablet TAKE 1 TABLET(50 MG) BY MOUTH AT BEDTIME AS NEEDED FOR SLEEP. MAY INCREASE TO 1 AND 1/2 TABLETS 75 MG DOSE 30 tablet 1   No current facility-administered medications for this visit.   Facility-Administered Medications Ordered in Other Visits  Medication Dose Route Frequency Provider Last Rate Last Admin   [START ON 12/15/2022] ketorolac (TORADOL) 15 MG/ML injection 15 mg  15 mg Intravenous On Call to OR Griselda Miner, MD        REVIEW OF SYSTEMS:   Constitutional: Denies fevers, chills or abnormal night sweats Eyes: Denies blurriness of vision, double vision or watery eyes Ears, nose, mouth, throat, and face: Denies mucositis or sore throat Respiratory: Denies cough, dyspnea or wheezes Cardiovascular: Denies palpitation, chest discomfort or lower extremity swelling Gastrointestinal:  Denies nausea, heartburn or change in bowel habits Skin: Denies abnormal skin rashes Lymphatics: Denies new lymphadenopathy or easy bruising Neurological:Denies numbness, tingling or new weaknesses Behavioral/Psych: Mood is stable, no new  changes  All other systems were reviewed with the patient and are negative.  PHYSICAL EXAMINATION: ECOG PERFORMANCE STATUS: 0 - Asymptomatic  Vitals:   12/14/22 1316  BP: (!) 162/65  Pulse: 83  Resp: 18  Temp: (!) 97.5 F (36.4 C)  SpO2: 100%   Filed Weights   12/14/22 1316  Weight: 141 lb 4.8 oz (64.1 kg)    GENERAL:alert, no distress and comfortable SKIN: skin color, texture, turgor are normal, no rashes or significant lesions EYES: normal, conjunctiva are pink and non-injected, sclera clear OROPHARYNX:no exudate, no erythema and lips, buccal mucosa, and tongue normal  NECK: supple, thyroid normal size, non-tender, without nodularity LYMPH:  no palpable lymphadenopathy in the cervical, axillary or inguinal LUNGS: clear to auscultation and percussion with normal breathing effort HEART: regular rate & rhythm and no murmurs and no lower extremity edema ABDOMEN:abdomen soft, non-tender and normal bowel sounds Musculoskeletal:no cyanosis of digits and no clubbing  PSYCH: alert & oriented x 3 with fluent speech NEURO: no focal motor/sensory deficits  Physical Exam   NECK: No abnormalities. CHEST: Breath sounds clear bilaterally. BREAST: Right breast exhibits bruising and firmness at the biopsy site, nipple is flat. No tenderness upon palpation. EXTREMITIES: No clubbing, cyanosis, or edema.      LABORATORY DATA:  I have reviewed the data as listed    Latest Ref Rng & Units 12/14/2022   12:49 PM 12/28/2021    3:20 PM 02/01/2021   11:43 AM  CBC  WBC 4.0 - 10.5 K/uL 5.7  5.6  4.4   Hemoglobin 12.0 - 15.0 g/dL 16.1  09.6  04.5   Hematocrit 36.0 - 46.0 % 43.0  42.3  44.2   Platelets 150 - 400 K/uL 261  259.0  258.0     @cmpl @  RADIOGRAPHIC STUDIES: I have personally reviewed the radiological images as listed and agreed with the findings in the report. [place] RT BREAST BX W LOC  DEV 1ST LESION IMG BX SPEC US GUIDE  Addendum Date: 12/09/2022   ADDENDUM REPORT: 12/09/2022  12:27 ADDENDUM: Pathology revealed GRADE II INVASIVE DUCTAL CARCINOMA, OTHER FINDINGS: FIBROCYSTIC CHANGE, ATYPICAL LOBULAR HYPERPLASIA of the RIGHT breast, 11 o'clock, (ribbon clip). This was found to be concordant by Dr. Laveda Abbe. Pathology results were discussed with the patient by telephone. The patient reported doing well after the biopsy with minimal tenderness at the site. Post biopsy instructions and care were reviewed and questions were answered. The patient was encouraged to call [facility] for any additional concerns. My direct phone number was provided. The patient was referred to [facility] on December 14, 2022. Pathology results reported by Rene Kocher, RN on 12/09/2022. Electronically Signed   By: Harmon Pier M.D.   On: 12/09/2022 12:27   Result Date: 12/09/2022 CLINICAL DATA:  77 year old female presents for tissue sampling of UPPER OUTER RIGHT breast mass. EXAM: ULTRASOUND GUIDED RIGHT BREAST CORE NEEDLE BIOPSY COMPARISON:  Previous exam(s). PROCEDURE: I met with the patient and we discussed the procedure of ultrasound-guided biopsy, including benefits and alternatives. We discussed the high likelihood of a successful procedure. We discussed the risks of the procedure, including infection, bleeding, tissue injury, clip migration, and inadequate sampling. Informed written consent was given. The usual time-out protocol was performed immediately prior to the procedure. Lesion quadrant: UPPER-OUTER RIGHT breast Using sterile technique and 1% Lidocaine as local anesthetic, under direct ultrasound visualization, a 12 gauge spring-loaded device was used to perform biopsy of the 1.5 cm UPPER-OUTER RIGHT breast mass at the 11 o'clock position using a LATERAL approach. At the conclusion of the procedure a RIBBON shaped tissue marker clip was deployed into the biopsy cavity. Follow up 2 view mammogram  was performed and dictated separately. IMPRESSION: Ultrasound guided biopsy of 1.5 cm UPPER-OUTER RIGHT breast mass. No apparent complications. Electronically Signed: By: Harmon Pier M.D. On: 12/07/2022 12:05   MM CLIP PLACEMENT RIGHT  Result Date: 12/07/2022 CLINICAL DATA:  Evaluate RIBBON biopsy clip placement following ultrasound-guided RIGHT breast biopsy. EXAM: 3D DIAGNOSTIC RIGHT MAMMOGRAM POST ULTRASOUND BIOPSY COMPARISON:  Previous exam(s). FINDINGS: 3D Mammographic images were obtained following ultrasound guided biopsy of the 1.5 cm 11 o'clock position RIGHT breast mass. The biopsy marking clip is in expected position at the site of biopsy. IMPRESSION: Appropriate positioning of the RIBBON shaped biopsy marking clip at the site of biopsy in the UPPER OUTER RIGHT breast. Final Assessment: Post Procedure Mammograms for Marker Placement Electronically Signed   By: Harmon Pier M.D.   On: 12/07/2022 12:16   MM 3D DIAGNOSTIC MAMMOGRAM UNILATERAL RIGHT BREAST  Result Date: 12/06/2022 CLINICAL DATA:  Recall from screening mammography, possible distortion involving the upper RIGHT breast at posterior depth. EXAM: DIGITAL DIAGNOSTIC UNILATERAL RIGHT MAMMOGRAM WITH TOMOSYNTHESIS AND CAD; ULTRASOUND RIGHT BREAST LIMITED TECHNIQUE: Right digital diagnostic mammography and breast tomosynthesis was performed. The images were evaluated with computer-aided detection. ; Targeted ultrasound examination of the right breast was performed COMPARISON:  Previous exam(s). ACR Breast Density Category c: The breasts are heterogeneously dense, which may obscure small masses. FINDINGS: Spot compression CC and MLO views of the area of concern were obtained. Persistent architectural distortion in the upper breast at posterior depth on the spot compression views, at or near 12 o'clock. No underlying mass and no suspicious calcifications. Targeted ultrasound is performed, demonstrating an irregular hypoechoic mass with vague and  angular margins at  11 o'clock 5 cm from the nipple measuring approximately 1.8 x 0.8 x 1.6 cm, demonstrating posterior acoustic shadowing and no internal power Doppler flow, associated with distortion, corresponding to the screening mammographic finding. Sonographic evaluation of the RIGHT axilla demonstrates no pathologic lymphadenopathy. IMPRESSION: 1. Highly suspicious 1.5 cm mass in the UPPER OUTER QUADRANT of the RIGHT breast at 11 o'clock 5 cm from the nipple. 2. No pathologic RIGHT axillary lymphadenopathy. RECOMMENDATION: Ultrasound-guided core needle biopsy of the RIGHT breast mass. I have discussed the findings and recommendations with the patient. The ultrasound core needle biopsy procedure was discussed with the patient and her questions were answered. She wishes to proceed with the biopsy which will be scheduled by the Breast Imaging staff. BI-RADS CATEGORY  5: Highly suggestive of malignancy. Electronically Signed   By: Hulan Saas M.D.   On: 12/06/2022 15:07   [place] LIMITED ULTRASOUND INCLUDING AXILLA RIGHT BREAST  Result Date: 12/06/2022 CLINICAL DATA:  Recall from screening mammography, possible distortion involving the upper RIGHT breast at posterior depth. EXAM: DIGITAL DIAGNOSTIC UNILATERAL RIGHT MAMMOGRAM WITH TOMOSYNTHESIS AND CAD; ULTRASOUND RIGHT BREAST LIMITED TECHNIQUE: Right digital diagnostic mammography and breast tomosynthesis was performed. The images were evaluated with computer-aided detection. ; Targeted ultrasound examination of the right breast was performed COMPARISON:  Previous exam(s). ACR Breast Density Category c: The breasts are heterogeneously dense, which may obscure small masses. FINDINGS: Spot compression CC and MLO views of the area of concern were obtained. Persistent architectural distortion in the upper breast at posterior depth on the spot compression views, at or near 12 o'clock. No underlying mass and no suspicious calcifications. Targeted ultrasound is  performed, demonstrating an irregular hypoechoic mass with vague and angular margins at 11 o'clock 5 cm from the nipple measuring approximately 1.8 x 0.8 x 1.6 cm, demonstrating posterior acoustic shadowing and no internal power Doppler flow, associated with distortion, corresponding to the screening mammographic finding. Sonographic evaluation of the RIGHT axilla demonstrates no pathologic lymphadenopathy. IMPRESSION: 1. Highly suspicious 1.5 cm mass in the UPPER OUTER QUADRANT of the RIGHT breast at 11 o'clock 5 cm from the nipple. 2. No pathologic RIGHT axillary lymphadenopathy. RECOMMENDATION: Ultrasound-guided core needle biopsy of the RIGHT breast mass. I have discussed the findings and recommendations with the patient. The ultrasound core needle biopsy procedure was discussed with the patient and her questions were answered. She wishes to proceed with the biopsy which will be scheduled by the Breast Imaging staff. BI-RADS CATEGORY  5: Highly suggestive of malignancy. Electronically Signed   By: Hulan Saas M.D.   On: 12/06/2022 15:07   MM 3D SCREENING MAMMOGRAM BILATERAL BREAST  Result Date: 11/24/2022 CLINICAL DATA:  Screening. EXAM: DIGITAL SCREENING BILATERAL MAMMOGRAM WITH TOMOSYNTHESIS AND CAD TECHNIQUE: Bilateral screening digital craniocaudal and mediolateral oblique mammograms were obtained. Bilateral screening digital breast tomosynthesis was performed. The images were evaluated with computer-aided detection. COMPARISON:  Previous exam(s). ACR Breast Density Category d: The breasts are extremely dense, which lowers the sensitivity of mammography. FINDINGS: In the right breast, possible subtle distortion warrants further evaluation. In the left breast, no findings suspicious for malignancy. IMPRESSION: Further evaluation is suggested for possible subtle distortion in the right breast. RECOMMENDATION: Diagnostic mammogram and possibly ultrasound of the right breast. (Code:[id number]) The  patient will be contacted regarding the findings, and additional imaging will be scheduled. BI-RADS CATEGORY  0: Incomplete: Need additional imaging evaluation. Electronically Signed   By: Hulan Saas M.D.   On: 11/24/2022 14:22    ASSESSMENT &  PLAN:  77 year old female, with screening discovered right breast cancer.  Primary malignant neoplasm of upper-outer quadrant of right breast, grade 2, cT1cN0M0, stage IA, ER+/PR+/ER2- ---We discussed her imaging findings and the biopsy results in great details. -Giving the early stage disease, she likely need a lumpectomy. She is agreeable with that. She was seen by Dr. Carolynne Edouard today and likely will proceed with surgery soon.  -Given her advanced age, and overall favorable prognostic panel, I do not recommend Oncotype or adjuvant chemotherapy. Giving the strong ER and PR expression in her postmenopausal status, I recommend adjuvant endocrine therapy with aromatase inhibitor for a total of 5 years to reduce the risk of cancer recurrence. Potential benefits and side effects were discussed with patient and she is interested. -She was also seen by radiation oncologist Dr. Mitzi Hansen today.  Adjuvant radiation was discussed, due to her advanced age, she would likely skip adjuvant radiation. -We also discussed the breast cancer surveillance after her surgery. She will continue annual screening mammogram, self exam, and a routine office visit with lab and exam with [place].  Arthritis Moderate symptoms in the back and knees. -Continue current pain management regimen.  Hypertension Controlled with Lisinopril. -Continue Lisinopril.  Osteopenia No current symptoms or treatment. -Monitor bone density, especially if considering anastrozole for breast cancer treatment.  Alcohol Consumption Moderate daily intake of wine. Discussed potential health risks including increased risk of breast cancer and liver disease. -Advise to reduce alcohol intake.  Plan -She will  proceed with lumpectomy with Dr. Carolynne Edouard in near future -I will see her after surgery or radiation if needed to start her on adjuvant antiestrogen therapy.    No orders of the defined types were placed in this encounter.   All questions were answered. The patient knows to call the clinic with any problems, questions or concerns. I spent 40 minutes counseling the patient face to face. The total time spent in the appointment was 50 minutes and more than 50% was on counseling.     Malachy Mood, MD 12/14/2022 4:37 PM

## 2022-12-14 NOTE — Progress Notes (Signed)
Briana Giles was seen by a genetic counselor during the breast multidisciplinary clinic on December 14, 2022.   In addition to her personal history of breast cancer, she reported a family history of:  Father w/ lung cancer (smoking hx) MGF w/ possible prostate cancer (no mets) dx before age 78 MGM w/ sinus cancer Mat uncle w/ leukemia (d. 8)  She does not meet NCCN criteria for genetic testing at this time. She was still offered genetic counseling and testing but declined.  She wished to speak with family members before deciding. We encourage her to contact us if there are any changes to her personal or family history of cancer. If she meets NCCN criteria based on the updated personal/family history, she would be recommended to have genetic counseling and testing.

## 2022-12-14 NOTE — Progress Notes (Signed)
[facility] Multidisciplinary Clinic Spiritual Care Note  Met with Novell and her husband in Breast Multidisciplinary Clinic to introduce Support Center team/resources.  she completed SDOH screening; results follow below.  SDOH Interventions    Flowsheet Row Clinical Support from 11/11/2021 in [facility] [place] Clinical Support from 11/10/2020 in [facility] [place]  SDOH Interventions    Food Insecurity Interventions Intervention Not Indicated Intervention Not Indicated  Housing Interventions Intervention Not Indicated --  Transportation Interventions Intervention Not Indicated Intervention Not Indicated  Utilities Interventions Intervention Not Indicated --  Alcohol Usage Interventions Intervention Not Indicated (Score <7) --  Financial Strain Interventions Intervention Not Indicated Intervention Not Indicated  Physical Activity Interventions Intervention Not Indicated Intervention Not Indicated  Stress Interventions Intervention Not Indicated Intervention Not Indicated  Social Connections Interventions Intervention Not Indicated Intervention Not Indicated       SDOH Screenings   Food Insecurity: No Food Insecurity (12/14/2022)  Housing: Low Risk  (11/11/2021)  Transportation Needs: No Transportation Needs (12/14/2022)  Utilities: Not At Risk (12/14/2022)  Alcohol Screen: Low Risk  (11/11/2021)  Depression (PHQ2-9): Low Risk  (11/15/2022)  Financial Resource Strain: Low Risk  (11/11/2021)  Physical Activity: Sufficiently Active (11/11/2021)  Social Connections: Unknown (11/30/2021)   Received from [facility], [facility]  Stress: No Stress Concern Present (11/11/2021)  Tobacco Use: Low Risk  (12/14/2022)    Chaplain and patient discussed common feelings and emotions when being diagnosed with cancer, and the importance of support during treatment.  Chaplain informed patient of the support team and support services at [facility].  Chaplain  provided contact information and encouraged patient to call with any questions or concerns.  Tamma Mandell reports from nearby friends and her two grown children who live out of state.  Follow up needed: Yes.  Recruitment consultant peer mentor referral and adding to email list per her request. Plan to check in by phone in ca two weeks for additional emotional support.   [address] [place], [place], [facility] Pager [phone] Voicemail [phone]

## 2022-12-16 ENCOUNTER — Encounter: Payer: Self-pay | Admitting: *Deleted

## 2022-12-16 ENCOUNTER — Other Ambulatory Visit: Payer: Self-pay | Admitting: General Surgery

## 2022-12-16 ENCOUNTER — Telehealth: Payer: Self-pay | Admitting: *Deleted

## 2022-12-16 DIAGNOSIS — C50411 Malignant neoplasm of upper-outer quadrant of right female breast: Secondary | ICD-10-CM

## 2022-12-16 HISTORY — PX: BREAST LUMPECTOMY: SHX2

## 2022-12-16 NOTE — Telephone Encounter (Signed)
Spoke to pt concerning [facility] from 12/14/22. Denies questions or concerns regarding dx or treatment care plan. Encourage pt to call with needs. Received verbal understanding.

## 2022-12-17 ENCOUNTER — Telehealth: Payer: Self-pay | Admitting: Hematology

## 2022-12-17 NOTE — Telephone Encounter (Signed)
VM left with patient to schedule f/u with Dr. Mosetta Putt the week of 12/9 per inbasket.

## 2022-12-21 NOTE — Progress Notes (Signed)
Office Visit Note  Patient: Briana Giles             Date of Birth: [birth year]           MRN: [id number]             PCP: Madelin Headings, MD Referring: Madelin Headings, MD Visit Date: 01/04/2023 Occupation: @GUAROCC @  Subjective:  Intermittent lower back pain   History of Present Illness: Briana Giles is a 77 y.o. female with history of osteoarthritis and DDD.  Patient states this has been experiencing some increased discomfort in her lower back.  Patient states that her symptoms have been intermittent and typically exacerbated by bending forward.  She denies any recent injury or fall.  She denies any nocturnal pain.  She is not currently experiencing any muscle spasms.  At times she has mild radiating pain into the buttocks.  Patient previously had radiofrequency ablation performed by Dr. Alvester Morin, which provided relief for about 10 years. She denies any discomfort with extension.  She has not been attending chair yoga recently but is planning to restart chair yoga.  She has been walking 45 minutes to an hour daily for exercise.  She experiences some discomfort rising from a seated position as well as climbing steps due to the discomfort in her knee replacements.  She takes Tylenol 2 tablets in the morning for pain relief. She is scheduled for a right lumpectomy on 01/19/23 and will be undergoing radiation therapy at well.    Activities of Daily Living:  Patient reports morning stiffness for 1 hour.   Patient Denies nocturnal pain.  Difficulty dressing/grooming: Denies Difficulty climbing stairs: Denies Difficulty getting out of chair: Denies Difficulty using hands for taps, buttons, cutlery, and/or writing: Denies  Review of Systems  Constitutional:  Negative for fatigue.  HENT:  Negative for mouth sores and mouth dryness.   Eyes:  Negative for dryness.  Respiratory:  Negative for shortness of breath.   Cardiovascular:  Negative for chest pain and palpitations.  Gastrointestinal:   Negative for blood in stool, constipation and diarrhea.  Endocrine: Negative for increased urination.  Genitourinary:  Negative for involuntary urination.  Musculoskeletal:  Positive for joint pain, joint pain and morning stiffness. Negative for gait problem, joint swelling, myalgias, muscle weakness, muscle tenderness and myalgias.  Skin:  Negative for color change, rash, hair loss and sensitivity to sunlight.  Allergic/Immunologic: Negative for susceptible to infections.  Neurological:  Negative for dizziness and headaches.  Hematological:  Negative for swollen glands.  Psychiatric/Behavioral:  Negative for depressed mood and sleep disturbance. The patient is not nervous/anxious.     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   Superior mesenteric artery stenosis (HCC) 02/22/2013   Abdominal bruit 12/31/2012   Encounter for Medicare annual wellness exam 12/31/2012   Degenerative joint disease of spine multilevel 03/07/2012   Spinal stenosis, lumbar 03/07/2012   Hip pain 09/02/2011   Sciatica of left side 08/14/2011   Spinal stenosis of lumbar region 08/14/2011  Encounter for long-term (current) use of other medications 06/25/2011   High risk medication use 12/21/2010   Need for lipid screening 12/21/2010   Foot pain, left 07/22/2010   CHEST PAIN 02/18/2010   Osteopenia 07/05/2009   Abnormal involuntary movement 01/26/2009   Backache 09/30/2008   NEOPLASM, SKIN, UNCERTAIN  BEHAVIOR 05/27/2008   HYPERPOTASSEMIA 05/27/2008   Essential hypertension 10/12/2007   TRANSIENT GLOBAL AMNESIA 10/12/2007   VARICOSE VEINS, LOWER EXTREMITIES 06/06/2007   SYSTOLIC MURMUR 06/06/2007   ABDOMINAL BRUIT 06/06/2007   Allergic rhinitis 08/17/2006   GERD 08/17/2006   Osteoarthritis 08/17/2006   Disturbance in sleep behavior 08/17/2006    Past Medical History:  Diagnosis Date   Abdominal pain, other specified site    Abnormal LFTs    Allergic rhinitis    Breast cancer (HCC)    Cervical back pain with evidence of disc disease    GERD (gastroesophageal reflux disease)    Heart murmur    says all her life had echo in [place] year ago not to worry   Hypertension    Nerve damage    left arm   Osteoarthritis    Osteopenia    Phlebitis    Spinal stenosis    Transient global amnesia     Family History  Problem Relation Age of Onset   COPD Mother    Osteoporosis Mother    Heart disease Mother    Hypertension Father    Alcohol abuse Father    Lung cancer Father        smoking hx   Hypertension Brother    Heart Problems Brother    Arthritis Brother    Heart attack Brother    Hypertension Brother    Cancer Maternal Grandmother        sinus   Prostate cancer Maternal Grandfather        no mets; dx <80   Healthy Daughter    Healthy Son    Leukemia Maternal Uncle        d. 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  Constitutional:      Appearance: She is well-developed.  HENT:     Head: Normocephalic and atraumatic.  Eyes:     Conjunctiva/sclera: Conjunctivae normal.  Cardiovascular:     Rate and Rhythm: Normal rate and regular rhythm.     Heart sounds: Normal heart sounds.  Pulmonary:     Effort: Pulmonary effort is normal.     Breath sounds: Normal breath sounds.  Abdominal:     General: Bowel sounds are normal.     Palpations: Abdomen is soft.  Musculoskeletal:     Cervical back: Normal range of motion.  Lymphadenopathy:     Cervical: No cervical adenopathy.  Skin:    General: Skin is warm and dry.     Capillary Refill: Capillary refill takes less than 2 seconds.  Neurological:      Mental Status: She is alert and oriented to person, place, and time.  Psychiatric:        Behavior: Behavior normal.      Musculoskeletal Exam: C-spine has slightly limited range of motion without rotation.  Thoracic kyphosis noted.  Some discomfort with flexion of the lumbar spine.  Shoulder joints, elbow joints, wrist joints, MCPs, PIPs, DIPs have good range of motion with no synovitis.  CMC, PIP, DIP thickening consistent with osteoarthritis of both hands.  Hip joints have good range of motion.  Bilateral knee replacements have slightly limited extension and warmth.  Ankle joints have good ROM with no warmth or effusion.   CDAI Exam: CDAI Score: -- Patient Global: --; Provider Global: -- Swollen: --; Tender: -- Joint Exam 01/04/2023   No joint exam has been documented for this visit   There is currently no information documented on the homunculus. Go to the Rheumatology activity and complete the homunculus joint exam.  Investigation: No additional findings.  Imaging: [place] RT BREAST BX W LOC DEV 1ST LESION IMG BX SPEC US GUIDE  Addendum Date: 12/09/2022   ADDENDUM REPORT: 12/09/2022 12:27 ADDENDUM: Pathology revealed GRADE II INVASIVE DUCTAL CARCINOMA, OTHER FINDINGS: FIBROCYSTIC CHANGE, ATYPICAL LOBULAR HYPERPLASIA of the RIGHT breast, 11 o'clock, (ribbon clip). This was found to be concordant by Dr. Laveda Abbe. Pathology results were discussed with the patient by telephone. The patient reported doing well after the biopsy with minimal tenderness at the site. Post biopsy instructions and care were reviewed and questions were answered. The patient was encouraged to call [facility] for any additional concerns. My direct phone number was provided. The patient was referred to [facility] at [facility] on December 14, 2022. Pathology results reported by Rene Kocher, RN on 12/09/2022. Electronically Signed    By: Harmon Pier M.D.   On: 12/09/2022 12:27   Result Date: 12/09/2022 CLINICAL DATA:  77 year old female presents for tissue sampling of UPPER OUTER RIGHT breast mass. EXAM: ULTRASOUND GUIDED RIGHT BREAST CORE NEEDLE BIOPSY COMPARISON:  Previous exam(s). PROCEDURE: I met with the patient and we discussed the procedure of ultrasound-guided biopsy, including benefits and alternatives. We discussed the high likelihood of a successful procedure. We discussed the risks of the procedure, including infection, bleeding, tissue injury, clip migration, and inadequate sampling. Informed written consent was given. The usual time-out protocol was performed immediately prior to the procedure. Lesion quadrant: UPPER-OUTER RIGHT breast Using sterile technique and 1% Lidocaine as local anesthetic, under direct ultrasound visualization, a 12 gauge spring-loaded device was used to perform biopsy of the 1.5 cm UPPER-OUTER RIGHT breast mass at the 11 o'clock position using a LATERAL approach. At the conclusion of the procedure a RIBBON shaped tissue marker clip was deployed into the biopsy cavity. Follow up 2 view mammogram was performed and dictated separately. IMPRESSION: Ultrasound guided biopsy of 1.5 cm UPPER-OUTER RIGHT breast mass. No apparent complications. Electronically Signed: By: Harmon Pier M.D. On: 12/07/2022 12:05   MM CLIP PLACEMENT RIGHT  Result Date: 12/07/2022 CLINICAL DATA:  Evaluate RIBBON biopsy clip placement following ultrasound-guided RIGHT breast biopsy. EXAM: 3D DIAGNOSTIC RIGHT MAMMOGRAM POST ULTRASOUND BIOPSY COMPARISON:  Previous exam(s). FINDINGS: 3D Mammographic images were obtained following ultrasound guided biopsy of the 1.5 cm 11 o'clock position RIGHT breast mass. The biopsy marking clip is in expected position at the site of biopsy. IMPRESSION: Appropriate positioning of the RIBBON shaped biopsy marking clip at the site of biopsy in the UPPER OUTER RIGHT breast. Final Assessment: Post  Procedure Mammograms for Marker Placement Electronically Signed   By: Harmon Pier M.D.   On: 12/07/2022 12:16   MM 3D DIAGNOSTIC MAMMOGRAM UNILATERAL RIGHT BREAST  Result Date: 12/06/2022 CLINICAL DATA:  Recall from screening mammography, possible distortion involving the upper RIGHT breast at posterior depth. EXAM: DIGITAL DIAGNOSTIC UNILATERAL RIGHT MAMMOGRAM WITH TOMOSYNTHESIS AND CAD; ULTRASOUND RIGHT BREAST LIMITED TECHNIQUE: Right digital diagnostic mammography and breast tomosynthesis was performed. The images were evaluated with computer-aided detection. ; Targeted ultrasound examination of the right breast was performed COMPARISON:  Previous exam(s). ACR Breast Density Category c: The breasts are heterogeneously dense, which may obscure small masses. FINDINGS: Spot compression CC and MLO views of the area of concern were obtained. Persistent architectural distortion in the upper breast at posterior depth on the spot compression views, at or near 12 o'clock. No underlying mass and no suspicious calcifications. Targeted ultrasound is performed, demonstrating an irregular hypoechoic mass with vague and angular margins at 11 o'clock 5 cm from the nipple measuring approximately 1.8 x 0.8 x 1.6 cm, demonstrating posterior acoustic shadowing and no internal power Doppler flow, associated with distortion, corresponding to the screening mammographic finding. Sonographic evaluation of the RIGHT axilla demonstrates no pathologic lymphadenopathy. IMPRESSION: 1. Highly suspicious 1.5 cm mass in the UPPER OUTER QUADRANT of the RIGHT breast at 11 o'clock 5 cm from the nipple. 2. No pathologic RIGHT axillary lymphadenopathy. RECOMMENDATION: Ultrasound-guided core needle biopsy of the RIGHT breast mass. I have discussed the findings and recommendations with the patient. The ultrasound core needle biopsy procedure was discussed with the patient and her questions were answered. She wishes to proceed with the biopsy which  will be scheduled by the Breast Imaging staff. BI-RADS CATEGORY  5: Highly suggestive of malignancy. Electronically Signed   By: Hulan Saas M.D.   On: 12/06/2022 15:07   [place] LIMITED ULTRASOUND INCLUDING AXILLA RIGHT BREAST  Result Date: 12/06/2022 CLINICAL DATA:  Recall from screening mammography, possible distortion involving the upper RIGHT breast at posterior depth. EXAM: DIGITAL DIAGNOSTIC UNILATERAL  RIGHT MAMMOGRAM WITH TOMOSYNTHESIS AND CAD; ULTRASOUND RIGHT BREAST LIMITED TECHNIQUE: Right digital diagnostic mammography and breast tomosynthesis was performed. The images were evaluated with computer-aided detection. ; Targeted ultrasound examination of the right breast was performed COMPARISON:  Previous exam(s). ACR Breast Density Category c: The breasts are heterogeneously dense, which may obscure small masses. FINDINGS: Spot compression CC and MLO views of the area of concern were obtained. Persistent architectural distortion in the upper breast at posterior depth on the spot compression views, at or near 12 o'clock. No underlying mass and no suspicious calcifications. Targeted ultrasound is performed, demonstrating an irregular hypoechoic mass with vague and angular margins at 11 o'clock 5 cm from the nipple measuring approximately 1.8 x 0.8 x 1.6 cm, demonstrating posterior acoustic shadowing and no internal power Doppler flow, associated with distortion, corresponding to the screening mammographic finding. Sonographic evaluation of the RIGHT axilla demonstrates no pathologic lymphadenopathy. IMPRESSION: 1. Highly suspicious 1.5 cm mass in the UPPER OUTER QUADRANT of the RIGHT breast at 11 o'clock 5 cm from the nipple. 2. No pathologic RIGHT axillary lymphadenopathy. RECOMMENDATION: Ultrasound-guided core needle biopsy of the RIGHT breast mass. I have discussed the findings and recommendations with the patient. The ultrasound core needle biopsy procedure was discussed with the patient and her  questions were answered. She wishes to proceed with the biopsy which will be scheduled by the Breast Imaging staff. BI-RADS CATEGORY  5: Highly suggestive of malignancy. Electronically Signed   By: Hulan Saas M.D.   On: 12/06/2022 15:07    Recent Labs: Lab Results  Component Value Date   WBC 5.7 12/14/2022   HGB 14.4 12/14/2022   PLT 261 12/14/2022   NA 139 12/14/2022   K 4.7 12/14/2022   CL 104 12/14/2022   CO2 28 12/14/2022   GLUCOSE 90 12/14/2022   BUN 24 (H) 12/14/2022   CREATININE 0.92 12/14/2022   BILITOT 0.6 12/14/2022   ALKPHOS 99 12/14/2022   AST 26 12/14/2022   ALT 23 12/14/2022   PROT 7.7 12/14/2022   ALBUMIN 4.5 12/14/2022   CALCIUM 10.0 12/14/2022   GFRAA 68 12/15/2016    Speciality Comments: Patient discontinued tramadol in April 2024  Procedures:  No procedures performed Allergies: Patient has no active allergies.   Assessment / Plan:     Visit Diagnoses: Primary osteoarthritis of both hands: She has PIP and DIP thickening consistent with osteoarthritis of both hands.  No synovitis or dactylitis noted.  S/P total knee arthroplasty, left: She experiences discomfort when rising from a seated position as well as climbing steps.  She has been able to walk 45 minutes to an hour daily without difficulty.  History of right partial knee replacement: She experiences some discomfort when rising from a seated position as well as climbing steps.  No effusion noted today.  Primary osteoarthritis of both feet: She is not experiencing any increased discomfort in her feet at this time.  She is wearing proper fitting shoes.  She is been walking 45 minutes to an hour daily for exercise.  History of bilateral bunionectomy  DDD (degenerative disc disease), cervical - X-rays of the C-spine updated on 02/01/21: Multilevel spondylosis and facet hypertrophy, greatest C4-C5 through C6-7.  C-spine is limited range of motion with lateral rotation.  No symptoms of radiculopathy at  this time.  Degeneration of intervertebral disc of lumbar region without discogenic back pain or lower extremity pain: X-rays of the lumbar spine were obtained on 03/16/2021 which were consistent with multilevel spondylosis with  significant disc space narrowing at multiple levels.  Facet joint narrowing and foraminal narrowing was also noted.  Patient presents today with increased discomfort in her lower back.  Her discomfort has been intermittent and is exacerbated by flexion of the lumbar spine.  She has not been experiencing any nocturnal pain or difficulty with walking.  She has not had any lower extremity weakness or symptoms of sciatica recently.  She is not experiencing any muscle spasms.  She has good flexion extension of the lumbar on examination today.  Some left-sided lower back discomfort with full flexion of the lumbar spine. Patient previously had a radiofrequency ablation performed by Dr. Cyndie Chime which provided relief for about 10 years. Patient plans on resuming chair yoga.  She will notify us when and if she would like a referral placed for physical therapy.  Other chronic pain - She is no longer taking tramadol.   Medication monitoring encounter: No longer taking tramadol.   Osteopenia of multiple sites: DEXA ordered by PCP--updated on 10/26/2022: Lumbar spine T-score +0.7, right femoral neck T-score -1.2, left femoral neck T-score -1.3.  Primary malignant neoplasm of upper outer quadrant of female breast, right [facility] Scheduled to undergo a lumpectomy on 01/19/2023.  She will also be undergoing radiation therapy.  Other medical conditions are listed as follows:   Family history of rheumatoid arthritis  History of varicose veins  History of hypertension  Abnormal serum angiotensin-converting enzyme level  History of gastroesophageal reflux (GERD)  Orders: No orders of the defined types were placed in this encounter.  No orders of the defined types were placed in this  encounter.    Follow-Up Instructions: Return in about 6 months (around 07/04/2023) for Osteoarthritis, DDD.   Gearldine Bienenstock, PA-C  Note - This record has been created using Dragon software.  Chart creation errors have been sought, but may not always  have been located. Such creation errors do not reflect on  the standard of medical care.

## 2022-12-29 ENCOUNTER — Telehealth: Payer: Self-pay | Admitting: *Deleted

## 2022-12-29 NOTE — Telephone Encounter (Signed)
Pt called wishing to cx all appts for sx and f/u. Pt has decided to transfer care to [facility]. Physician team notified. Informed pt to reach out if she should have needs in future. Received verbal understanding.

## 2023-01-02 NOTE — Progress Notes (Unsigned)
No chief complaint on file.   HPI: Patient  Briana Giles  77 y.o. comes in today for yearly visit   New dx of  breast cancer  r  on mammogram  MS  spinal stensosi   HT   Health Maintenance  Topic Date Due   COVID-19 Vaccine (7 - 2023-24 season) 12/11/2022   Medicare Annual Wellness (AWV)  11/15/2023   DTaP/Tdap/Td (3 - Td or Tdap) 01/07/2024   Pneumonia Vaccine 70+ Years old  Completed   INFLUENZA VACCINE  Completed   DEXA SCAN  Completed   Hepatitis C Screening  Completed   Zoster Vaccines- Shingrix  Completed   HPV VACCINES  Aged Out   Colonoscopy  Discontinued   Health Maintenance Review LIFESTYLE:  Exercise:   Tobacco/ETS: Alcohol:  Sugar beverages: Sleep: Drug use: no HH of  Work:    ROS:  GEN/ HEENT: No fever, significant weight changes sweats headaches vision problems hearing changes, CV/ PULM; No chest pain shortness of breath cough, syncope,edema  change in exercise tolerance. GI /GU: No adominal pain, vomiting, change in bowel habits. No blood in the stool. No significant GU symptoms. SKIN/HEME: ,no acute skin rashes suspicious lesions or bleeding. No lymphadenopathy, nodules, masses.  NEURO/ PSYCH:  No neurologic signs such as weakness numbness. No depression anxiety. IMM/ Allergy: No unusual infections.  Allergy .   REST of 12 system review negative except as per HPI   Past Medical History:  Diagnosis Date   Abdominal pain, other specified site    Abnormal LFTs    Allergic rhinitis    Breast cancer (HCC)    Cervical back pain with evidence of disc disease    GERD (gastroesophageal reflux disease)    Heart murmur    says all her life had echo in [place] year ago not to worry   Hypertension    Nerve damage    left arm   Osteoarthritis    Osteopenia    Phlebitis    Spinal stenosis    Transient global amnesia     Past Surgical History:  Procedure Laterality Date   BREAST BIOPSY Right 12/07/2022   [place] RT BREAST BX W LOC DEV 1ST  LESION IMG BX SPEC US GUIDE 12/07/2022 [facility]   BUNIONECTOMY     fall 2022 [facility]   CHOLECYSTECTOMY     CYST EXCISION Right 01/2018   right thumb    FOOT SURGERY      x 2     HAND SURGERY     right hand   KNEE ARTHROSCOPY     right   REPLACEMENT UNICONDYLAR JOINT KNEE     rt knee   TONSILLECTOMY     TOTAL KNEE ARTHROPLASTY Left 06/2019    Family History  Problem Relation Age of Onset   COPD Mother    Osteoporosis Mother    Heart disease Mother    Hypertension Father    Alcohol abuse Father    Lung cancer Father        smoking hx   Hypertension Brother    Heart Problems Brother    Arthritis Brother    Heart attack Brother    Hypertension Brother    Cancer Maternal Grandmother        sinus   Prostate cancer Maternal Grandfather        no mets; dx <80   Healthy Daughter    Healthy Son    Leukemia Maternal Uncle  d. 8   Colon cancer Neg Hx    Pancreatic cancer Neg Hx    Stomach cancer Neg Hx     Social History   Socioeconomic History   Marital status: Married    Spouse name: Not on file   Number of children: 2   Years of education: Not on file   Highest education level: Master's degree (e.g., MA, MS, MEng, MEd, MSW, MBA)  Occupational History   Occupation: retired Magazine features editor: RETIRED  Tobacco Use   Smoking status: Never    Passive exposure: Never   Smokeless tobacco: Never  Vaping Use   Vaping status: Never Used  Substance and Sexual Activity   Alcohol use: Yes    Alcohol/week: 14.0 standard drinks of alcohol    Types: 14 Glasses of wine per week    Comment: occasional    Drug use: Never   Sexual activity: Not on file  Other Topics Concern   Not on file  Social History Narrative   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  [place] grand child    Does water exercise aerobics walking elliptical    Social Determinants of Health   Financial Resource  Strain: Low Risk  (01/02/2023)   Overall Financial Resource Strain (CARDIA)    Difficulty of Paying Living Expenses: Not hard at all  Food Insecurity: No Food Insecurity (01/02/2023)   Hunger Vital Sign    Worried About Running Out of Food in the Last Year: Never true    Ran Out of Food in the Last Year: Never true  Transportation Needs: No Transportation Needs (01/02/2023)   PRAPARE - Administrator, Civil Service (Medical): No    Lack of Transportation (Non-Medical): No  Physical Activity: Sufficiently Active (01/02/2023)   Exercise Vital Sign    Days of Exercise per Week: 7 days    Minutes of Exercise per Session: 50 min  Stress: No Stress Concern Present (01/02/2023)   [employer] of Occupational Health - Occupational Stress Questionnaire    Feeling of Stress : Only a little  Social Connections: Unknown (01/02/2023)   Social Connection and Isolation Panel [NHANES]    Frequency of Communication with Friends and Family: More than three times a week    Frequency of Social Gatherings with Friends and Family: Three times a week    Attends Religious Services: Never    Active Member of Clubs or Organizations: Patient declined    Attends Banker Meetings: Not on file    Marital Status: Married    Outpatient Medications Prior to Visit  Medication Sig Dispense Refill   acetaminophen (TYLENOL) 500 MG tablet Take 1,000 mg by mouth daily.     fluticasone (FLONASE) 50 MCG/ACT nasal spray USE TWO SPRAYS IN EACH NOSTRIL ONCE DAILY. (Patient taking differently: as needed.) 16 g 5   lisinopril (ZESTRIL) 20 MG tablet TAKE 1 TABLET BY MOUTH EVERY DAY 90 tablet 2   polyethylene glycol powder (MIRALAX) powder Take 17 g by mouth once. (Patient taking differently: Take 17 g by mouth as needed.) 255 g 0   traZODone (DESYREL) 50 MG tablet TAKE 1 TABLET(50 MG) BY MOUTH AT BEDTIME AS NEEDED FOR SLEEP. MAY INCREASE TO 1 AND 1/2 TABLETS 75 MG DOSE 30 tablet 1   No  facility-administered medications prior to visit.     EXAM:  There were  no vitals taken for this visit.  There is no height or weight on file to calculate BMI. Wt Readings from Last 3 Encounters:  12/14/22 141 lb 4.8 oz (64.1 kg)  11/15/22 138 lb (62.6 kg)  07/14/22 135 lb 3.2 oz (61.3 kg)    Physical Exam: Vital signs reviewed NWG:NFAO is a well-developed well-nourished alert cooperative    who appearsr stated age in no acute distress.  HEENT: normocephalic atraumatic , Eyes: PERRL EOM's full, conjunctiva clear, Nares: paten,t no deformity discharge or tenderness., Ears: no deformity EAC's clear TMs with normal landmarks. Mouth: clear OP, no lesions, edema.  Moist mucous membranes. Dentition in adequate repair. NECK: supple without masses, thyromegaly or bruits. CHEST/PULM:  Clear to auscultation and percussion breath sounds equal no wheeze , rales or rhonchi. No chest wall deformities or tenderness. Breast: normal by inspection . No dimpling, discharge, masses, tenderness or discharge . CV: PMI is nondisplaced, S1 S2 no gallops, murmurs, rubs. Peripheral pulses are full without delay.No JVD .  ABDOMEN: Bowel sounds normal nontender  No guard or rebound, no hepato splenomegal no CVA tenderness.  No hernia. Extremtities:  No clubbing cyanosis or edema, no acute joint swelling or redness no focal atrophy NEURO:  Oriented x3, cranial nerves 3-12 appear to be intact, no obvious focal weakness,gait within normal limits no abnormal reflexes or asymmetrical SKIN: No acute rashes normal turgor, color, no bruising or petechiae. PSYCH: Oriented, good eye contact, no obvious depression anxiety, cognition and judgment appear normal. LN: no cervical axillary inguinal adenopathy  Lab Results  Component Value Date   WBC 5.7 12/14/2022   HGB 14.4 12/14/2022   HCT 43.0 12/14/2022   PLT 261 12/14/2022   GLUCOSE 90 12/14/2022   CHOL 229 (H) 12/28/2021   TRIG 122.0 12/28/2021   HDL 68.60  12/28/2021   LDLDIRECT 127.4 12/27/2011   LDLCALC 136 (H) 12/28/2021   ALT 23 12/14/2022   AST 26 12/14/2022   NA 139 12/14/2022   K 4.7 12/14/2022   CL 104 12/14/2022   CREATININE 0.92 12/14/2022   BUN 24 (H) 12/14/2022   CO2 28 12/14/2022   TSH 1.67 12/28/2021   INR 0.9 05/29/2019    BP Readings from Last 3 Encounters:  12/14/22 (!) 162/65  07/14/22 (!) 149/83  12/28/21 128/60    Lab results reviewed with patient   ASSESSMENT AND PLAN:  Discussed the following assessment and plan:  No diagnosis found. No follow-ups on file.  Patient Care Team: Nain Rudd, Neta Mends, MD as PCP - [facility], Kirstie Peri, DPM (Podiatry) Pollyann Savoy, MD (Rheumatology) Rachael Fee, MD (Gastroenterology) Elmon Else, MD as Attending Physician (Dermatology) Elliot Dally, MD as Referring Physician (Orthopedic Surgery) Donnelly Angelica, RN as Oncology Nurse Navigator Pershing Proud, RN as Oncology Nurse Navigator Griselda Miner, MD as Consulting Physician (General Surgery) Malachy Mood, MD as Consulting Physician (Hematology) Dorothy Puffer, MD as Consulting Physician (Radiation Oncology) There are no Patient Instructions on file for this visit.  Neta Mends. Azuri Bozard M.D.

## 2023-01-03 ENCOUNTER — Ambulatory Visit (INDEPENDENT_AMBULATORY_CARE_PROVIDER_SITE_OTHER): Payer: Medicare Other | Admitting: Internal Medicine

## 2023-01-03 ENCOUNTER — Encounter: Payer: Self-pay | Admitting: Internal Medicine

## 2023-01-03 VITALS — BP 160/100 | HR 85 | Temp 97.7°F | Ht 59.8 in | Wt 140.8 lb

## 2023-01-03 DIAGNOSIS — Z170 Estrogen receptor positive status [ER+]: Secondary | ICD-10-CM

## 2023-01-03 DIAGNOSIS — E785 Hyperlipidemia, unspecified: Secondary | ICD-10-CM | POA: Diagnosis not present

## 2023-01-03 DIAGNOSIS — Z79899 Other long term (current) drug therapy: Secondary | ICD-10-CM | POA: Diagnosis not present

## 2023-01-03 DIAGNOSIS — R059 Cough, unspecified: Secondary | ICD-10-CM

## 2023-01-03 DIAGNOSIS — M1990 Unspecified osteoarthritis, unspecified site: Secondary | ICD-10-CM | POA: Diagnosis not present

## 2023-01-03 DIAGNOSIS — C50911 Malignant neoplasm of unspecified site of right female breast: Secondary | ICD-10-CM

## 2023-01-03 DIAGNOSIS — I10 Essential (primary) hypertension: Secondary | ICD-10-CM | POA: Diagnosis not present

## 2023-01-03 LAB — TSH: TSH: 1.46 u[IU]/mL (ref 0.35–5.50)

## 2023-01-03 LAB — LIPID PANEL
Cholesterol: 249 mg/dL — ABNORMAL HIGH (ref 0–200)
HDL: 58.7 mg/dL (ref >39.00)
LDL Cholesterol: 147 mg/dL — ABNORMAL HIGH (ref 0–99)
NonHDL: 190.4
Total CHOL/HDL Ratio: 4
Triglycerides: 219 mg/dL — ABNORMAL HIGH (ref 0.0–149.0)
VLDL: 43.8 mg/dL — ABNORMAL HIGH (ref 0.0–40.0)

## 2023-01-03 MED ORDER — VALSARTAN 160 MG PO TABS: 160.0000 mg | ORAL_TABLET | Freq: Every day | ORAL | 3 refills | Status: DC

## 2023-01-03 NOTE — Patient Instructions (Signed)
Lipid and tsh level today. Rest of labs done in October  in system.  Stop lisinopril and change to valsartan to see if cough is better and bp control is better.  Plan fu virtual visit  ok in 2-3 mos after change .   Back pain is probabaly  mechanical related to your back arthritis but can have Dr D also check.

## 2023-01-04 ENCOUNTER — Ambulatory Visit: Payer: Medicare Other | Attending: Physician Assistant | Admitting: Physician Assistant

## 2023-01-04 ENCOUNTER — Encounter: Payer: Self-pay | Admitting: Physician Assistant

## 2023-01-04 VITALS — BP 140/79 | HR 70 | Resp 13 | Ht 61.0 in | Wt 142.0 lb

## 2023-01-04 DIAGNOSIS — Z8679 Personal history of other diseases of the circulatory system: Secondary | ICD-10-CM | POA: Diagnosis present

## 2023-01-04 DIAGNOSIS — M5030 Other cervical disc degeneration, unspecified cervical region: Secondary | ICD-10-CM | POA: Insufficient documentation

## 2023-01-04 DIAGNOSIS — Z8719 Personal history of other diseases of the digestive system: Secondary | ICD-10-CM | POA: Insufficient documentation

## 2023-01-04 DIAGNOSIS — C50411 Malignant neoplasm of upper-outer quadrant of right female breast: Secondary | ICD-10-CM | POA: Diagnosis present

## 2023-01-04 DIAGNOSIS — M19041 Primary osteoarthritis, right hand: Secondary | ICD-10-CM | POA: Insufficient documentation

## 2023-01-04 DIAGNOSIS — Z96659 Presence of unspecified artificial knee joint: Secondary | ICD-10-CM | POA: Insufficient documentation

## 2023-01-04 DIAGNOSIS — M19072 Primary osteoarthritis, left ankle and foot: Secondary | ICD-10-CM | POA: Diagnosis present

## 2023-01-04 DIAGNOSIS — M19042 Primary osteoarthritis, left hand: Secondary | ICD-10-CM | POA: Diagnosis present

## 2023-01-04 DIAGNOSIS — M51369 Other intervertebral disc degeneration, lumbar region without mention of lumbar back pain or lower extremity pain: Secondary | ICD-10-CM | POA: Diagnosis present

## 2023-01-04 DIAGNOSIS — Z5181 Encounter for therapeutic drug level monitoring: Secondary | ICD-10-CM | POA: Diagnosis present

## 2023-01-04 DIAGNOSIS — Z96652 Presence of left artificial knee joint: Secondary | ICD-10-CM | POA: Insufficient documentation

## 2023-01-04 DIAGNOSIS — R748 Abnormal levels of other serum enzymes: Secondary | ICD-10-CM | POA: Insufficient documentation

## 2023-01-04 DIAGNOSIS — G8929 Other chronic pain: Secondary | ICD-10-CM | POA: Insufficient documentation

## 2023-01-04 DIAGNOSIS — Z9889 Other specified postprocedural states: Secondary | ICD-10-CM | POA: Insufficient documentation

## 2023-01-04 DIAGNOSIS — Z8261 Family history of arthritis: Secondary | ICD-10-CM | POA: Insufficient documentation

## 2023-01-04 DIAGNOSIS — M19071 Primary osteoarthritis, right ankle and foot: Secondary | ICD-10-CM | POA: Diagnosis present

## 2023-01-04 DIAGNOSIS — M8589 Other specified disorders of bone density and structure, multiple sites: Secondary | ICD-10-CM | POA: Insufficient documentation

## 2023-01-06 ENCOUNTER — Encounter (HOSPITAL_BASED_OUTPATIENT_CLINIC_OR_DEPARTMENT_OTHER): Admission: RE | Payer: Self-pay | Source: Home / Self Care

## 2023-01-06 ENCOUNTER — Ambulatory Visit (HOSPITAL_BASED_OUTPATIENT_CLINIC_OR_DEPARTMENT_OTHER): Admission: RE | Admit: 2023-01-06 | Payer: Medicare Other | Source: Home / Self Care | Admitting: General Surgery

## 2023-01-06 SURGERY — BREAST LUMPECTOMY WITH RADIOACTIVE SEED LOCALIZATION
Anesthesia: General | Laterality: Right

## 2023-01-23 ENCOUNTER — Other Ambulatory Visit: Payer: Medicare Other

## 2023-01-23 ENCOUNTER — Ambulatory Visit: Payer: Medicare Other | Admitting: Hematology

## 2023-02-09 ENCOUNTER — Telehealth: Payer: Self-pay | Admitting: Radiation Oncology

## 2023-02-09 NOTE — Telephone Encounter (Signed)
Called patient to schedule consult with Radiation Oncology. Patient stated she was already scheduled to follow-up with Dr. Riley Lam at [facility] on 02/22/23 for Radiation Oncology. Patient declined to schedule with [facility] at this time.

## 2023-02-09 NOTE — Telephone Encounter (Signed)
Delayed entry. I called on 12/24 and let the patient know Dr. Mitzi Hansen felt shewas able to forgo radiation if she proceeds with antiestrogen therapy. She is motivated to come in to discuss this further and will be contacted by our scheduling team.

## 2023-04-05 ENCOUNTER — Encounter: Payer: Self-pay | Admitting: Internal Medicine

## 2023-04-05 NOTE — Progress Notes (Signed)
Realizing  you are  dealing with  breast cancer treatment .  Still want you to make a fu visit with me  ( can be virtual when convenient  for you  )regarding Hypertension control and medications  and  review any past lab results

## 2023-04-19 ENCOUNTER — Encounter: Payer: Self-pay | Admitting: Internal Medicine

## 2023-04-19 ENCOUNTER — Telehealth (INDEPENDENT_AMBULATORY_CARE_PROVIDER_SITE_OTHER): Payer: Medicare Other | Admitting: Internal Medicine

## 2023-04-19 VITALS — BP 132/66 | Ht 61.0 in | Wt 142.0 lb

## 2023-04-19 DIAGNOSIS — I10 Essential (primary) hypertension: Secondary | ICD-10-CM

## 2023-04-19 DIAGNOSIS — E785 Hyperlipidemia, unspecified: Secondary | ICD-10-CM | POA: Diagnosis not present

## 2023-04-19 DIAGNOSIS — Z79899 Other long term (current) drug therapy: Secondary | ICD-10-CM

## 2023-04-19 NOTE — Progress Notes (Signed)
 Virtual Visit via Video Note  I connected with Briana Giles on 04/19/23 at 11:30 AM EST by a video enabled telemedicine application and verified that I am speaking with the correct person using two identifiers. Location patient: home Location provider:work office Persons participating in the virtual visit: patient, provider   Patient aware  of the limitations of evaluation and management by telemedicine and  availability of in person appointments. and agreed to proceed.   HPI: Briana Giles presents for video visit FU see bp control and labs   When takes reading  is ok at home not that often a check . May be higher in office.  No se of meds  Eats pretty healthy  f no cv neuro dis . Mom had heart diease asn was a smoker  NGM also cv disease at older ages.   Has t2 more rx for her breast cancer  ROS: See pertinent positives and negatives per HPI. No cv  sx or signs  Past Medical History:  Diagnosis Date   Abdominal pain, other specified site    Abnormal LFTs    Allergic rhinitis    Breast cancer (HCC)    Cervical back pain with evidence of disc disease    GERD (gastroesophageal reflux disease)    Heart murmur    says all her life had echo in [place] year ago not to worry   Hypertension    Nerve damage    left arm   Osteoarthritis    Osteopenia    Phlebitis    Spinal stenosis    Transient global amnesia     Past Surgical History:  Procedure Laterality Date   BREAST BIOPSY Right 12/07/2022   [place] RT BREAST BX W LOC DEV 1ST LESION IMG BX SPEC US GUIDE 12/07/2022 [facility]   BUNIONECTOMY     fall 2022 [facility]   CHOLECYSTECTOMY     CYST EXCISION Right 01/2018   right thumb    FOOT SURGERY      x 2     HAND SURGERY     right hand   KNEE ARTHROSCOPY     right   REPLACEMENT UNICONDYLAR JOINT KNEE     rt knee   TONSILLECTOMY     TOTAL KNEE ARTHROPLASTY Left 06/2019    Family History  Problem Relation Age of Onset   COPD Mother    Osteoporosis Mother     Heart disease Mother    Hypertension Father    Alcohol abuse Father    Lung cancer Father        smoking hx   Hypertension Brother    Heart Problems Brother    Arthritis Brother    Heart attack Brother    Hypertension Brother    Cancer Maternal Grandmother        sinus   Prostate cancer Maternal Grandfather        no mets; dx <80   Healthy Daughter    Healthy Son    Leukemia Maternal Uncle        d. 8   Colon cancer Neg Hx    Pancreatic cancer Neg Hx    Stomach cancer Neg Hx     Social History   Tobacco Use   Smoking status: Never    Passive exposure: Never   Smokeless tobacco: Never  Vaping Use   Vaping status: Never Used  Substance Use Topics   Alcohol use: Yes    Alcohol/week: 14.0 standard drinks of alcohol  Types: 14 Glasses of wine per week    Comment: occasional    Drug use: Never      Current Outpatient Medications:    acetaminophen (TYLENOL) 500 MG tablet, Take 1,000 mg by mouth daily., Disp: , Rfl:    letrozole (FEMARA) 2.5 MG tablet, Take by mouth., Disp: , Rfl:    valsartan (DIOVAN) 160 MG tablet, Take 1 tablet (160 mg total) by mouth daily. Stop lisinopril, Disp: 90 tablet, Rfl: 3   fluticasone (FLONASE) 50 MCG/ACT nasal spray, USE TWO SPRAYS IN EACH NOSTRIL ONCE DAILY. (Patient not taking: Reported on 04/19/2023), Disp: 16 g, Rfl: 5   polyethylene glycol powder (MIRALAX) powder, Take 17 g by mouth once. (Patient not taking: Reported on 04/19/2023), Disp: 255 g, Rfl: 0   traZODone (DESYREL) 50 MG tablet, TAKE 1 TABLET(50 MG) BY MOUTH AT BEDTIME AS NEEDED FOR SLEEP. MAY INCREASE TO 1 AND 1/2 TABLETS 75 MG DOSE (Patient not taking: Reported on 04/19/2023), Disp: 30 tablet, Rfl: 1  EXAM: BP Readings from Last 3 Encounters:  04/19/23 132/66  01/04/23 (!) 140/79  01/03/23 (!) 160/100    VITALS per patient if applicable: BP Readings from Last 3 Encounters:  04/19/23 132/66  01/04/23 (!) 140/79  01/03/23 (!) 160/100    GENERAL: alert, oriented,  appears well and in no acute distress  HEENT: atraumatic, conjunttiva clear, no obvious abnormalities on inspection of external nose and ears  NECK: normal movements of the head and neck  LUNGS: on inspection no signs of respiratory distress, breathing rate appears normal, no obvious gross SOB, gasping or wheezing  CV: no obvious cyanosis  MS: moves all visible extremities without noticeable abnormality  PSYCH/NEURO: pleasant and cooperative, no obvious depression or anxiety, speech and thought processing grossly intact Lab Results  Component Value Date   WBC 5.7 12/14/2022   HGB 14.4 12/14/2022   HCT 43.0 12/14/2022   PLT 261 12/14/2022   GLUCOSE 90 12/14/2022   CHOL 249 (H) 01/03/2023   TRIG 219.0 (H) 01/03/2023   HDL 58.70 01/03/2023   LDLDIRECT 127.4 12/27/2011   LDLCALC 147 (H) 01/03/2023   ALT 23 12/14/2022   AST 26 12/14/2022   NA 139 12/14/2022   K 4.7 12/14/2022   CL 104 12/14/2022   CREATININE 0.92 12/14/2022   BUN 24 (H) 12/14/2022   CO2 28 12/14/2022   TSH 1.46 01/03/2023   INR 0.9 05/29/2019   Lab review with patient   ASSESSMENT AND PLAN:  Discussed the following assessment and plan:    ICD-10-CM   1. Essential hypertension  I10 Lipoprotein A (LPA)    NMR LipoProfile+Lipids+IR    2. Medication management  Z79.899 Lipoprotein A (LPA)    NMR LipoProfile+Lipids+IR    3. Hyperlipidemia, unspecified hyperlipidemia type  E78.5 Lipoprotein A (LPA)    NMR LipoProfile+Lipids+IR     Get a few days of bp readings to ensure at goal av erage less than 140/90 Fam hx of vascular on materna side  age related  Her risk factor is age .  Eats pretty healthy  per report.  Plan fu lipid nmr in summer fasting    and then fu go from there .  Counseled.   Expectant management and discussion of plan and treatment with opportunity to ask questions and all were answered. The patient agreed with the plan and demonstrated an understanding of the instructions.   Advised  to call back or seek an in-person evaluation if worsening  or having  further  concerns  in interim. Return for fasting labs in summer and then fu for lipids .    Berniece Andreas, MD

## 2023-05-01 ENCOUNTER — Telehealth: Payer: Self-pay | Admitting: Internal Medicine

## 2023-05-01 NOTE — Telephone Encounter (Signed)
 Copied from CRM [id number]. Topic: General - Other >> May 01, 2023 10:00 AM Pascal Lux wrote: Reason for CRM: Patient requesting a call back from Dr. Fabian Sharp or her nurse regarding blood pressure.  3/5 - 132/66 3/6 -151/73 3/8 - 168/76 3/9 - 207/80 3/10 -182/77

## 2023-05-03 ENCOUNTER — Telehealth: Payer: Self-pay

## 2023-05-03 DIAGNOSIS — I10 Essential (primary) hypertension: Secondary | ICD-10-CM

## 2023-05-03 MED ORDER — HYDROCHLOROTHIAZIDE 12.5 MG PO TABS: 12.5000 mg | ORAL_TABLET | Freq: Every day | ORAL | 0 refills | Status: DC

## 2023-05-03 NOTE — Telephone Encounter (Signed)
 Copied from CRM [id number]. Topic: Clinical - Medical Advice >> May 03, 2023 12:36 PM Kathryne Eriksson wrote: Reason for CRM: Blood Pressure >> May 03, 2023 12:38 PM Kathryne Eriksson wrote: Patient is requesting a call back in regards to her calling about her blood pressure concerns. Patient is wanting to know if she should continue taking the medication she's currently on, or be switched to something else. Patient call back number is [phone]

## 2023-05-03 NOTE — Telephone Encounter (Signed)
 Patient notified of update  and verbalized understanding. Pt stated that she will call back to schedule the lab appt. Due to traveling.

## 2023-05-03 NOTE — Telephone Encounter (Signed)
 Follow up with pt.   Pt reports she is taking her Valsartan 160mg  1 tab in the morning. Took one this morning. Pt denied chest pain, dizziness nor blurry vision.   Pt updates she finished her radiation therapy a wk ago. Pt states she did not check her BP as she doesn't have it with her.   Advise pt to proceed to ER if has chest pain, headache , blurry vision. This cma will forward message to provider and will update her once get a response. Pt verbalized understanding.

## 2023-05-03 NOTE — Telephone Encounter (Addendum)
 The messages are  not clear to  me  in many separate  encounters   that are closed   The readings obtained  in the one message  were not all controlled     We can add  hydrochlorothiazide 12.5 mg per day and then get lab  BMp in 2 weeks   I(f this is helpful then  we can order a pill that  combines the 2 )  Please stay on valsartan and then send in hydrochlorothiazide 12.5 mg one a day disp 90 and order BMP for 2 weeks freom startin and then fu   Televisit ok or in person  after  all done

## 2023-05-03 NOTE — Addendum Note (Signed)
 Addended by: Waymon Amato R on: 05/03/2023 05:04 PM   Modules accepted: Orders

## 2023-05-05 NOTE — Telephone Encounter (Signed)
 See updated thread  encounter and adding  hydrochlorothiazide and fu bmp and results

## 2023-07-05 NOTE — Progress Notes (Signed)
 Office Visit Note  Patient: Briana Giles             Date of Birth: 1945/06/04           MRN: [id number]             PCP: Reginal Capra, MD Referring: Reginal Capra, MD Visit Date: 07/19/2023 Occupation: @GUAROCC @  Subjective:  Increased low back pain.   History of Present Illness: Briana Giles is a 78 y.o. female with osteoarthritis and degenerative disc disease.  She returns today after her last visit on January 04, 2023.  She states for the last couple of months she has been having increased lower back pain.  She has difficulty walking after 10 to 15 minutes.  She has to sit down frequently.  She says the pain is usually localized but sometimes radiates into her lower extremities.  She has had injections by Dr. Daisey Dryer in the past and also radiofrequency ablation.  She states the radiofrequency ablation was very helpful.  She has been going for regular exercise and chair yoga.  She continues to have some stiffness in her hands.  She states her right partial knee replacement gives her some discomfort.  Left total knee replacement is doing well.  She has stiffness in her feet and overcrowding of the toes without much discomfort.  Neck pain is better.  Has been taking calcium and vitamin D .  She is on letrozole and vitamin C.  She had right breast lumpectomy in November 2024.  She finished radiation therapy in March 2025.    Activities of Daily Living:  Patient reports morning stiffness for 1 hour.   Patient Denies nocturnal pain.  Difficulty dressing/grooming: Reports Difficulty climbing stairs: Reports Difficulty getting out of chair: Denies Difficulty using hands for taps, buttons, cutlery, and/or writing: Denies  Review of Systems  Constitutional:  Negative for fatigue.  HENT:  Positive for mouth dryness. Negative for mouth sores.   Eyes:  Negative for dryness.  Respiratory:  Negative for shortness of breath.   Cardiovascular:  Negative for chest pain and palpitations.   Gastrointestinal:  Negative for blood in stool, constipation and diarrhea.  Endocrine: Positive for increased urination.  Genitourinary:  Negative for involuntary urination.  Musculoskeletal:  Positive for joint pain, gait problem, joint pain and morning stiffness. Negative for joint swelling, myalgias, muscle weakness, muscle tenderness and myalgias.  Skin:  Negative for color change, rash, hair loss and sensitivity to sunlight.  Allergic/Immunologic: Negative for susceptible to infections.  Neurological:  Negative for dizziness and headaches.  Hematological:  Negative for swollen glands.  Psychiatric/Behavioral:  Positive for depressed mood. Negative for sleep disturbance. The patient is not nervous/anxious.     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   Superior mesenteric artery stenosis (HCC) 02/22/2013   Abdominal bruit 12/31/2012   Encounter for Medicare annual wellness exam 12/31/2012   Degenerative joint disease of spine multilevel 03/07/2012   Spinal stenosis, lumbar 03/07/2012   Hip pain 09/02/2011   Sciatica  of left side 08/14/2011   Spinal stenosis of lumbar region 08/14/2011   Encounter for long-term (current) use of other medications 06/25/2011   High risk medication use 12/21/2010   Need for lipid screening 12/21/2010   Foot pain, left 07/22/2010   CHEST PAIN 02/18/2010   Osteopenia 07/05/2009   Abnormal involuntary movement 01/26/2009   Backache 09/30/2008    NEOPLASM, SKIN, UNCERTAIN BEHAVIOR 05/27/2008   HYPERPOTASSEMIA 05/27/2008   Essential hypertension 10/12/2007   TRANSIENT GLOBAL AMNESIA 10/12/2007   VARICOSE VEINS, LOWER EXTREMITIES 06/06/2007   SYSTOLIC MURMUR 06/06/2007   ABDOMINAL BRUIT 06/06/2007   Allergic rhinitis 08/17/2006   GERD 08/17/2006   Osteoarthritis 08/17/2006   Disturbance in sleep behavior 08/17/2006    Past Medical History:  Diagnosis Date   Abdominal pain, other specified site    Abnormal LFTs    Allergic rhinitis    Breast cancer (HCC)    Cervical back pain with evidence of disc disease    GERD (gastroesophageal reflux disease)    Heart murmur    says all her life had echo in [place] year ago not to worry   Hypertension    Nerve damage    left arm   Osteoarthritis    Osteopenia    Phlebitis    Spinal stenosis    Transient global amnesia     Family History  Problem Relation Age of Onset   COPD Mother    Osteoporosis Mother    Heart disease Mother    Hypertension Father    Alcohol abuse Father    Lung cancer Father        smoking hx   Hypertension Brother    Heart Problems Brother    Arthritis Brother    Heart attack Brother    Hypertension Brother    Cancer Maternal Grandmother        sinus   Prostate cancer Maternal Grandfather        no mets; dx <80   Healthy Daughter    Healthy Son    Leukemia Maternal Uncle        d. 8   Colon cancer Neg Hx    Pancreatic cancer Neg Hx    Stomach cancer Neg Hx    Past Surgical History:  Procedure Laterality Date   BREAST BIOPSY Right 12/07/2022   US  RT BREAST BX W LOC DEV 1ST LESION IMG BX SPEC US  GUIDE 12/07/2022 [facility]   BREAST LUMPECTOMY Right 12/2022   BUNIONECTOMY     fall 2022 [facility]   CHOLECYSTECTOMY     CYST EXCISION Right 01/2018   right thumb    FOOT SURGERY      x 2     HAND SURGERY     right hand   KNEE ARTHROSCOPY     right   REPLACEMENT UNICONDYLAR JOINT KNEE     rt knee   TONSILLECTOMY     TOTAL KNEE  ARTHROPLASTY Left 06/2019   Social History   Social History Narrative   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  [place] grand child    Does water exercise aerobics walking elliptical    Immunization History  Administered Date(s) Administered   Fluad Quad(high Dose 65+) 12/14/2020, 10/27/2021   Influenza Split 12/27/2011, 10/15/2012   Influenza Whole 12/03/2007, 12/24/2009, 10/25/2010   Influenza, High Dose Seasonal PF 12/31/2013, 11/06/2014, 12/10/2016, 10/25/2017, 10/31/2018   Influenza,inj,quad,  With Preservative 11/10/2017   Influenza-Unspecified 11/14/2016, 10/16/2022   PFIZER(Purple Top)SARS-COV-2 Vaccination 03/08/2019, 03/30/2019, 10/30/2019, 11/03/2020, 10/27/2021   Pfizer Covid-19 Vaccine Bivalent Booster 4yrs & up 11/03/2020, 10/16/2022   Pneumococcal Conjugate-13 01/06/2014   Pneumococcal Polysaccharide-23 12/21/2010   Td 05/27/2008   Tdap 01/06/2014   Zoster Recombinant(Shingrix) 10/26/2017, 10/31/2018   Zoster, Live 12/03/2007     Objective: Vital Signs: BP (!) 152/79 (BP Location: Left Arm, Patient Position: Sitting, Cuff Size: Normal)   Pulse 86   Resp 13   Ht 5\' 1"  (1.549 m)   Wt 144 lb 3.2 oz (65.4 kg)   BMI 27.25 kg/m    Physical Exam Vitals and nursing note reviewed.  Constitutional:      Appearance: She is well-developed.  HENT:     Head: Normocephalic and atraumatic.  Eyes:     Conjunctiva/sclera: Conjunctivae normal.  Cardiovascular:     Rate and Rhythm: Normal rate and regular rhythm.     Heart sounds: Normal heart sounds.  Pulmonary:     Effort: Pulmonary effort is normal.     Breath sounds: Normal breath sounds.  Abdominal:     General: Bowel sounds are normal.     Palpations: Abdomen is soft.  Musculoskeletal:     Cervical back: Normal range of motion.  Lymphadenopathy:     Cervical: No cervical adenopathy.  Skin:    General: Skin is warm and dry.     Capillary  Refill: Capillary refill takes less than 2 seconds.  Neurological:     Mental Status: She is alert and oriented to person, place, and time.  Psychiatric:        Behavior: Behavior normal.      Musculoskeletal Exam: She had limited lateral motion of the cervical spine without discomfort.  She had limited range of lumbar spine with some discomfort.  Levoscoliosis was noted.  She had no SI joint tenderness.  Shoulders, elbows, wrist joints, MCPs PIPs and DIPs Juengel range of motion.  Bilateral PIP and DIP thickening was noted.  Mucinous cyst was noted over the right index finger DIP joint.  Hip joints were in good range of motion.  Bilateral knee joints were replaced and had limited extension.  There was no tenderness over ankles or MTPs.  Overcrowding of toes was noted.  CDAI Exam: CDAI Score: -- Patient Global: --; Provider Global: -- Swollen: --; Tender: -- Joint Exam 07/19/2023   No joint exam has been documented for this visit   There is currently no information documented on the homunculus. Go to the Rheumatology activity and complete the homunculus joint exam.  Investigation: No additional findings.  Imaging: No results found.  Recent Labs: Lab Results  Component Value Date   WBC 5.7 12/14/2022   HGB 14.4 12/14/2022   PLT 261 12/14/2022   NA 139 12/14/2022   K 4.7 12/14/2022   CL 104 12/14/2022   CO2 28 12/14/2022   GLUCOSE 90 12/14/2022   BUN 24 (H) 12/14/2022   CREATININE 0.92 12/14/2022   BILITOT 0.6 12/14/2022   ALKPHOS 99 12/14/2022   AST 26 12/14/2022   ALT 23 12/14/2022   PROT 7.7 12/14/2022   ALBUMIN 4.5 12/14/2022   CALCIUM 10.0 12/14/2022   GFRAA 68 12/15/2016    Speciality Comments: Patient discontinued tramadol  in April 2024  Procedures:  No procedures performed Allergies: Patient has no known allergies.   Assessment / Plan:     Visit Diagnoses: Primary osteoarthritis of both hands-she continues to have some stiffness  in her hands.  No  synovitis was noted.  Bilateral PIP and DIP thickening was noted.  Joint protection muscle strengthening was discussed.  A handout on hand exercises was given.  Digital mucinous cyst - right index finger DIP.  Observation was advised.  I advised her to contact us  if her symptoms get worse.  S/P total knee arthroplasty, left-she has some limitation with extension without discomfort.  History of right partial knee replacement-she is having some discomfort.  No warmth swelling or effusion was noted.  Limited extension was noted.  Primary osteoarthritis of both feet-she has overcrowding of the toes.  She gives history of a stiffness in her feet in the morning and after prolonged sitting.  No synovitis was noted.  History of bilateral bunionectomy  DDD (degenerative disc disease), cervical -she had limited range of motion without discomfort.  X-rays of the C-spine updated on 02/01/21: Multilevel spondylosis and facet hypertrophy, greatest C4-C5 through C6-7.  Degeneration of intervertebral disc of lumbar region without discogenic back pain or lower extremity pain -she has been experiencing increased pain and discomfort in her lower back.  She had good response to radiofrequency ablation by Dr. Daisey Dryer in the past.  X-rays of the lumbar spine were obtained on 03/16/2021 which were consistent with multilevel spondylosis with significant disc space narrowing at multiple levels.  I will refer her to Dr. Daisey Dryer again.  A handout on back exercises was given.  Some exercises were demonstrated in the office.- Plan: Ambulatory referral to Physical Medicine Rehab  Other chronic pain - She is no longer taking tramadol .  Osteopenia of multiple sites - DEXA ordered by PCP--updated on 10/26/2022: Lumbar spine T-score +0.7, right femoral neck T-score -1.2, left femoral neck T-score -1.3.  Use of calcium rich diet with vitamin D  was discussed.  Patient has been exercising on a regular basis.  Resistive exercises were  emphasized.  Primary malignant neoplasm of upper outer quadrant of female breast, right (HCC) - Scheduled to undergo a lumpectomy on 01/19/2023.  She finished radiation therapy in March 2025.  She is on letrozole.  Family history of rheumatoid arthritis  History of varicose veins  History of hypertension-blood pressure was elevated at 143/82.  Repeat blood pressure was 152/79.  She was advised to monitor blood pressure closely and follow-up with her PCP.  Abnormal serum angiotensin-converting enzyme level  History of gastroesophageal reflux (GERD)  Orders: Orders Placed This Encounter  Procedures   Ambulatory referral to Physical Medicine Rehab   No orders of the defined types were placed in this encounter.    Follow-Up Instructions: Return in about 5 months (around 12/19/2023) for Osteoarthritis.   Nicholas Bari, MD  Note - This record has been created using Animal nutritionist.  Chart creation errors have been sought, but may not always  have been located. Such creation errors do not reflect on  the standard of medical care.

## 2023-07-19 ENCOUNTER — Ambulatory Visit: Payer: Medicare Other | Attending: Rheumatology | Admitting: Rheumatology

## 2023-07-19 ENCOUNTER — Encounter: Payer: Self-pay | Admitting: Rheumatology

## 2023-07-19 VITALS — BP 152/79 | HR 86 | Resp 13 | Ht 61.0 in | Wt 144.2 lb

## 2023-07-19 DIAGNOSIS — Z9889 Other specified postprocedural states: Secondary | ICD-10-CM

## 2023-07-19 DIAGNOSIS — M8589 Other specified disorders of bone density and structure, multiple sites: Secondary | ICD-10-CM

## 2023-07-19 DIAGNOSIS — Z96659 Presence of unspecified artificial knee joint: Secondary | ICD-10-CM

## 2023-07-19 DIAGNOSIS — Z8719 Personal history of other diseases of the digestive system: Secondary | ICD-10-CM

## 2023-07-19 DIAGNOSIS — Z5181 Encounter for therapeutic drug level monitoring: Secondary | ICD-10-CM

## 2023-07-19 DIAGNOSIS — Z8679 Personal history of other diseases of the circulatory system: Secondary | ICD-10-CM

## 2023-07-19 DIAGNOSIS — M19071 Primary osteoarthritis, right ankle and foot: Secondary | ICD-10-CM

## 2023-07-19 DIAGNOSIS — Z96652 Presence of left artificial knee joint: Secondary | ICD-10-CM | POA: Diagnosis not present

## 2023-07-19 DIAGNOSIS — M5030 Other cervical disc degeneration, unspecified cervical region: Secondary | ICD-10-CM

## 2023-07-19 DIAGNOSIS — G8929 Other chronic pain: Secondary | ICD-10-CM

## 2023-07-19 DIAGNOSIS — R748 Abnormal levels of other serum enzymes: Secondary | ICD-10-CM

## 2023-07-19 DIAGNOSIS — M67449 Ganglion, unspecified hand: Secondary | ICD-10-CM | POA: Diagnosis not present

## 2023-07-19 DIAGNOSIS — M51369 Other intervertebral disc degeneration, lumbar region without mention of lumbar back pain or lower extremity pain: Secondary | ICD-10-CM

## 2023-07-19 DIAGNOSIS — M19072 Primary osteoarthritis, left ankle and foot: Secondary | ICD-10-CM

## 2023-07-19 DIAGNOSIS — M19041 Primary osteoarthritis, right hand: Secondary | ICD-10-CM | POA: Diagnosis not present

## 2023-07-19 DIAGNOSIS — Z8261 Family history of arthritis: Secondary | ICD-10-CM

## 2023-07-19 DIAGNOSIS — C50411 Malignant neoplasm of upper-outer quadrant of right female breast: Secondary | ICD-10-CM

## 2023-07-19 DIAGNOSIS — M19042 Primary osteoarthritis, left hand: Secondary | ICD-10-CM

## 2023-07-19 NOTE — Patient Instructions (Signed)
 Hand Exercises Hand exercises can be helpful for almost anyone. They can strengthen your hands and improve flexibility and movement. The exercises can also increase blood flow to the hands. These results can make your work and daily tasks easier for you. Hand exercises can be especially helpful for people who have joint pain from arthritis or nerve damage from using their hands over and over. These exercises can also help people who injure a hand. Exercises Most of these hand exercises are gentle stretching and motion exercises. It is usually safe to do them often throughout the day. Warming up your hands before exercise may help reduce stiffness. You can do this with gentle massage or by placing your hands in warm water for 10-15 minutes. It is normal to feel some stretching, pulling, tightness, or mild discomfort when you begin new exercises. In time, this will improve. Remember to always be careful and stop right away if you feel sudden, very bad pain or your pain gets worse. You want to get better and be safe. Ask your health care provider which exercises are safe for you. Do exercises exactly as told by your provider and adjust them as told. Do not begin these exercises until told by your provider. Knuckle bend or "claw" fist  Stand or sit with your arm, hand, and all five fingers pointed straight up. Make sure to keep your wrist straight. Gently bend your fingers down toward your palm until the tips of your fingers are touching your palm. Keep your big knuckle straight and only bend the small knuckles in your fingers. Hold this position for 10 seconds. Straighten your fingers back to your starting position. Repeat this exercise 5-10 times with each hand. Full finger fist  Stand or sit with your arm, hand, and all five fingers pointed straight up. Make sure to keep your wrist straight. Gently bend your fingers into your palm until the tips of your fingers are touching the middle of your  palm. Hold this position for 10 seconds. Extend your fingers back to your starting position, stretching every joint fully. Repeat this exercise 5-10 times with each hand. Straight fist  Stand or sit with your arm, hand, and all five fingers pointed straight up. Make sure to keep your wrist straight. Gently bend your fingers at the big knuckle, where your fingers meet your hand, and at the middle knuckle. Keep the knuckle at the tips of your fingers straight and try to touch the bottom of your palm. Hold this position for 10 seconds. Extend your fingers back to your starting position, stretching every joint fully. Repeat this exercise 5-10 times with each hand. Tabletop  Stand or sit with your arm, hand, and all five fingers pointed straight up. Make sure to keep your wrist straight. Gently bend your fingers at the big knuckle, where your fingers meet your hand, as far down as you can. Keep the small knuckles in your fingers straight. Think of forming a tabletop with your fingers. Hold this position for 10 seconds. Extend your fingers back to your starting position, stretching every joint fully. Repeat this exercise 5-10 times with each hand. Finger spread  Place your hand flat on a table with your palm facing down. Make sure your wrist stays straight. Spread your fingers and thumb apart from each other as far as you can until you feel a gentle stretch. Hold this position for 10 seconds. Bring your fingers and thumb tight together again. Hold this position for 10 seconds. Repeat  this exercise 5-10 times with each hand. Making circles  Stand or sit with your arm, hand, and all five fingers pointed straight up. Make sure to keep your wrist straight. Make a circle by touching the tip of your thumb to the tip of your index finger. Hold for 10 seconds. Then open your hand wide. Repeat this motion with your thumb and each of your fingers. Repeat this exercise 5-10 times with each hand. Thumb  motion  Sit with your forearm resting on a table and your wrist straight. Your thumb should be facing up toward the ceiling. Keep your fingers relaxed as you move your thumb. Lift your thumb up as high as you can toward the ceiling. Hold for 10 seconds. Bend your thumb across your palm as far as you can, reaching the tip of your thumb for the small finger (pinkie) side of your palm. Hold for 10 seconds. Repeat this exercise 5-10 times with each hand. Grip strengthening  Hold a stress ball or other soft ball in the middle of your hand. Slowly increase the pressure, squeezing the ball as much as you can without causing pain. Think of bringing the tips of your fingers into the middle of your palm. All of your finger joints should bend when doing this exercise. Hold your squeeze for 10 seconds, then relax. Repeat this exercise 5-10 times with each hand. Contact a health care provider if: Your hand pain or discomfort gets much worse when you do an exercise. Your hand pain or discomfort does not improve within 2 hours after you exercise. If you have either of these problems, stop doing these exercises right away. Do not do them again unless your provider says that you can. Get help right away if: You develop sudden, severe hand pain or swelling. If this happens, stop doing these exercises right away. Do not do them again unless your provider says that you can. This information is not intended to replace advice given to you by your health care provider. Make sure you discuss any questions you have with your health care provider. Document Revised: 02/15/2022 Document Reviewed: 02/15/2022 [employer] Patient Education  2024 [employer] Low Back Sprain or Strain Rehab Ask your health care provider which exercises are safe for you. Do exercises exactly as told by your health care provider and adjust them as directed. It is normal to feel mild stretching, pulling, tightness, or discomfort as you do these  exercises. Stop right away if you feel sudden pain or your pain gets worse. Do not begin these exercises until told by your health care provider. Stretching and range-of-motion exercises These exercises warm up your muscles and joints and improve the movement and flexibility of your back. These exercises also help to relieve pain, numbness, and tingling. Lumbar rotation  Lie on your back on a firm bed or the floor with your knees bent. Straighten your arms out to your sides so each arm forms a 90-degree angle (right angle) with a side of your body. Slowly move (rotate) both of your knees to one side of your body until you feel a stretch in your lower back (lumbar). Try not to let your shoulders lift off the floor. Hold this position for __________ seconds. Tense your abdominal muscles and slowly move your knees back to the starting position. Repeat this exercise on the other side of your body. Repeat __________ times. Complete this exercise __________ times a day. Single knee to chest  Lie on your back on a  firm bed or the floor with both legs straight. Bend one of your knees. Use your hands to move your knee up toward your chest until you feel a gentle stretch in your lower back and buttock. Hold your leg in this position by holding on to the front of your knee. Keep your other leg as straight as possible. Hold this position for __________ seconds. Slowly return to the starting position. Repeat with your other leg. Repeat __________ times. Complete this exercise __________ times a day. Prone extension on elbows  Lie on your abdomen on a firm bed or the floor (prone position). Prop yourself up on your elbows. Use your arms to help lift your chest up until you feel a gentle stretch in your abdomen and your lower back. This will place some of your body weight on your elbows. If this is uncomfortable, try stacking pillows under your chest. Your hips should stay down, against the surface that  you are lying on. Keep your hip and back muscles relaxed. Hold this position for __________ seconds. Slowly relax your upper body and return to the starting position. Repeat __________ times. Complete this exercise __________ times a day. Strengthening exercises These exercises build strength and endurance in your back. Endurance is the ability to use your muscles for a long time, even after they get tired. Pelvic tilt This exercise strengthens the muscles that lie deep in the abdomen. Lie on your back on a firm bed or the floor with your legs extended. Bend your knees so they are pointing toward the ceiling and your feet are flat on the floor. Tighten your lower abdominal muscles to press your lower back against the floor. This motion will tilt your pelvis so your tailbone points up toward the ceiling instead of pointing to your feet or the floor. To help with this exercise, you may place a small towel under your lower back and try to push your back into the towel. Hold this position for __________ seconds. Let your muscles relax completely before you repeat this exercise. Repeat __________ times. Complete this exercise __________ times a day. Alternating arm and leg raises  Get on your hands and knees on a firm surface. If you are on a hard floor, you may want to use padding, such as an exercise mat, to cushion your knees. Line up your arms and legs. Your hands should be directly below your shoulders, and your knees should be directly below your hips. Lift your left leg behind you. At the same time, raise your right arm and straighten it in front of you. Do not lift your leg higher than your hip. Do not lift your arm higher than your shoulder. Keep your abdominal and back muscles tight. Keep your hips facing the ground. Do not arch your back. Keep your balance carefully, and do not hold your breath. Hold this position for __________ seconds. Slowly return to the starting  position. Repeat with your right leg and your left arm. Repeat __________ times. Complete this exercise __________ times a day. Abdominal set with straight leg raise  Lie on your back on a firm bed or the floor. Bend one of your knees and keep your other leg straight. Tense your abdominal muscles and lift your straight leg up, 4-6 inches (10-15 cm) off the ground. Keep your abdominal muscles tight and hold this position for __________ seconds. Do not hold your breath. Do not arch your back. Keep it flat against the ground. Keep your abdominal muscles  tense as you slowly lower your leg back to the starting position. Repeat with your other leg. Repeat __________ times. Complete this exercise __________ times a day. Single leg lower with bent knees Lie on your back on a firm bed or the floor. Tense your abdominal muscles and lift your feet off the floor, one foot at a time, so your knees and hips are bent in 90-degree angles (right angles). Your knees should be over your hips and your lower legs should be parallel to the floor. Keeping your abdominal muscles tense and your knee bent, slowly lower one of your legs so your toe touches the ground. Lift your leg back up to return to the starting position. Do not hold your breath. Do not let your back arch. Keep your back flat against the ground. Repeat with your other leg. Repeat __________ times. Complete this exercise __________ times a day. Posture and body mechanics Good posture and healthy body mechanics can help to relieve stress in your body's tissues and joints. Body mechanics refers to the movements and positions of your body while you do your daily activities. Posture is part of body mechanics. Good posture means: Your spine is in its natural S-curve position (neutral). Your shoulders are pulled back slightly. Your head is not tipped forward (neutral). Follow these guidelines to improve your posture and body mechanics in your everyday  activities. Standing  When standing, keep your spine neutral and your feet about hip-width apart. Keep a slight bend in your knees. Your ears, shoulders, and hips should line up. When you do a task in which you stand in one place for a long time, place one foot up on a stable object that is 2-4 inches (5-10 cm) high, such as a footstool. This helps keep your spine neutral. Sitting  When sitting, keep your spine neutral and keep your feet flat on the floor. Use a footrest, if necessary, and keep your thighs parallel to the floor. Avoid rounding your shoulders, and avoid tilting your head forward. When working at a desk or a computer, keep your desk at a height where your hands are slightly lower than your elbows. Slide your chair under your desk so you are close enough to maintain good posture. When working at a computer, place your monitor at a height where you are looking straight ahead and you do not have to tilt your head forward or downward to look at the screen. Resting When lying down and resting, avoid positions that are most painful for you. If you have pain with activities such as sitting, bending, stooping, or squatting, lie in a position in which your body does not bend very much. For example, avoid curling up on your side with your arms and knees near your chest (fetal position). If you have pain with activities such as standing for a long time or reaching with your arms, lie with your spine in a neutral position and bend your knees slightly. Try the following positions: Lying on your side with a pillow between your knees. Lying on your back with a pillow under your knees. Lifting  When lifting objects, keep your feet at least shoulder-width apart and tighten your abdominal muscles. Bend your knees and hips and keep your spine neutral. It is important to lift using the strength of your legs, not your back. Do not lock your knees straight out. Always ask for help to lift heavy or  awkward objects. This information is not intended to replace advice given  to you by your health care provider. Make sure you discuss any questions you have with your health care provider. Document Revised: 06/06/2022 Document Reviewed: 04/20/2020 Elsevier Patient Education  2024 [employer].

## 2023-07-21 ENCOUNTER — Other Ambulatory Visit: Payer: Self-pay | Admitting: Adult Health

## 2023-07-21 ENCOUNTER — Telehealth: Payer: Self-pay

## 2023-07-21 ENCOUNTER — Other Ambulatory Visit (INDEPENDENT_AMBULATORY_CARE_PROVIDER_SITE_OTHER)

## 2023-07-21 DIAGNOSIS — I10 Essential (primary) hypertension: Secondary | ICD-10-CM | POA: Diagnosis not present

## 2023-07-21 LAB — BASIC METABOLIC PANEL WITH GFR
BUN: 29 mg/dL — ABNORMAL HIGH (ref 6–23)
CO2: 28 meq/L (ref 19–32)
Calcium: 10.2 mg/dL (ref 8.4–10.5)
Chloride: 100 meq/L (ref 96–112)
Creatinine, Ser: 1.35 mg/dL — ABNORMAL HIGH (ref 0.40–1.20)
GFR: 37.55 mL/min — ABNORMAL LOW (ref >60.00)
Glucose, Bld: 95 mg/dL (ref 70–99)
Potassium: 4.3 meq/L (ref 3.5–5.1)
Sodium: 138 meq/L (ref 135–145)

## 2023-07-21 NOTE — Telephone Encounter (Signed)
 Copied from CRM [id number]. Topic: Clinical - Prescription Issue >> Jul 21, 2023 10:48 AM Melissa C wrote: Reason for CRM: Patient stated they are low on hydrochlorothiazide  (HYDRODIURIL ) 12.5 MG tablet with zero refills and it is secondary to blood pressure medicine they are taking so they are wondering if they are able to get more refills on medication

## 2023-07-24 LAB — LIPOPROTEIN A (LPA): Lipoprotein (a): 26.4 nmol/L (ref <75.0)

## 2023-07-24 LAB — NMR LIPOPROFILE+LIPIDS+IR
Cholesterol, Total: 259 mg/dL — ABNORMAL HIGH (ref 100–199)
HDL Particle Number: 40.1 umol/L (ref >=30.5)
HDL Size: 9.3 nm (ref >=9.2)
HDL-C: 62 mg/dL (ref >39)
LDL Particle Number: 1471 nmol/L — ABNORMAL HIGH (ref <1000)
LDL Size: 22.3 nm (ref >20.5)
LDL Size: 22.3 nm (ref >=20.8)
LDL-C (NIH Calc): 164 mg/dL — ABNORMAL HIGH (ref 0–99)
LP-IR Score: 46 — ABNORMAL HIGH (ref <=45)
Large HDL-P: 6.3 umol/L (ref >=4.8)
Large VLDL-P: 5.5 nmol/L — ABNORMAL HIGH (ref <=2.7)
Small LDL Particle Number: 107 nmol/L (ref <=527)
Small LDL-P: 107 nmol/L (ref <=527)
Triglycerides: 182 mg/dL — ABNORMAL HIGH (ref 0–149)
VLDL Size: 50.8 nm — ABNORMAL HIGH (ref <=46.6)

## 2023-07-25 NOTE — Telephone Encounter (Signed)
 Refill sent 07/21/23

## 2023-08-02 ENCOUNTER — Ambulatory Visit: Admitting: Physical Medicine and Rehabilitation

## 2023-08-03 ENCOUNTER — Other Ambulatory Visit: Payer: Self-pay

## 2023-08-03 NOTE — Telephone Encounter (Signed)
 Attempted to contact the patient and left message for patient to call the office.

## 2023-08-03 NOTE — Telephone Encounter (Addendum)
 Patient is going to [place]  Sunday an would like to know if she can get something for back pain or maybe an injection, she will be doing a lot of walking in [place]. Please advise

## 2023-08-04 MED ORDER — METHOCARBAMOL 500 MG PO TABS: 500.0000 mg | ORAL_TABLET | Freq: Two times a day (BID) | ORAL | 0 refills | Status: AC | PRN

## 2023-08-04 NOTE — Telephone Encounter (Signed)
 Please send a prescription for methocarbamol  500 mg p.o. twice daily as needed for 10 days.  The side effects include drowsiness and dizziness.  However methocarbamol  has the least drowsiness of all the muscle relaxers.  Patient should not be driving after taking muscle relaxer.

## 2023-08-04 NOTE — Telephone Encounter (Signed)
 Patient returned call to the office. Patient states she is having lower back pain. Patient states she was seen in the office recently and had x-rays. Patient was requesting pain medication as she will be travelling to [place] for vacation. Patient states she is only able to walk for about 10-15 minutes without having to stop and take a break. Patient advised we do not prescribe pain medication. Patient states she would be willing to try a muscle relaxer. Patient states she would like one that would not make her to drowsy so she may enjoy her trip.

## 2023-08-16 ENCOUNTER — Telehealth: Payer: Self-pay | Admitting: Physical Medicine and Rehabilitation

## 2023-08-16 NOTE — Telephone Encounter (Signed)
 Pt called to reschedule her appt with [facility]. Please  call pt at [phone].

## 2023-09-19 ENCOUNTER — Ambulatory Visit (INDEPENDENT_AMBULATORY_CARE_PROVIDER_SITE_OTHER): Admitting: Physical Medicine and Rehabilitation

## 2023-09-19 ENCOUNTER — Encounter: Payer: Self-pay | Admitting: Physical Medicine and Rehabilitation

## 2023-09-19 DIAGNOSIS — M47816 Spondylosis without myelopathy or radiculopathy, lumbar region: Secondary | ICD-10-CM

## 2023-09-19 DIAGNOSIS — G8929 Other chronic pain: Secondary | ICD-10-CM

## 2023-09-19 DIAGNOSIS — M5441 Lumbago with sciatica, right side: Secondary | ICD-10-CM

## 2023-09-19 DIAGNOSIS — M5416 Radiculopathy, lumbar region: Secondary | ICD-10-CM | POA: Diagnosis not present

## 2023-09-19 NOTE — Progress Notes (Unsigned)
 Pain Scale   Average Pain 7 Patient advising she has chronic lower back pain that occurs when walking and standing, pain does decrease when sitting, resting and taking pain medication        +Driver, -BT, -Dye Allergies.

## 2023-09-19 NOTE — Progress Notes (Unsigned)
 Briana Giles - 78 y.o. female MRN [id number]  Date of birth: Feb 08, 1946  Office Visit Note: Visit Date: 09/19/2023 PCP: Charlett Apolinar POUR, MD Referred by: Charlett Apolinar POUR, MD  Subjective: Chief Complaint  Patient presents with   Lower Back - Pain   HPI: Briana Giles is a 78 y.o. female who comes in today per the request of Dr. Maya Nash for evaluation of chronic, worsening and severe bilateral lower back pain radiating down right posterolateral leg to ankle. Lower back pain seems to be biggest pain generator for her. Pain ongoing for several years. Her pain worsens with prolonged standing and walking. She describes pain as sharp, shooting and sore sensation, currently rates as 7 out of 10. Some relief of pain with home exercise regimen, rest and use of medications. Some relief of pain with Tylenol and Ibuprofen. History of formal physical therapy with good relief of pain. She is fairly active person, exercises at [facility] multiple times a week. Lumbar radiographs from 2023 show multi level disc height loss, facet arthropathy and foraminal stenosis. She was previously treated by Dr. Eldonna about 15 years ago, she recalls having lumbar epidural injection and radiofrequency ablation. States injection was not helpful, however ablation procedure provided pain relief for several years. Patient denies focal weakness, numbness and tingling. No recent trauma or falls.   She is currently treated by Dr. Nash for osteoarthritis. She does have history of breast cancer and osteopenia.      Review of Systems  Musculoskeletal:  Positive for back pain.  Neurological:  Negative for tingling, sensory change, focal weakness and weakness.  All other systems reviewed and are negative.  Otherwise per HPI.  Assessment & Plan: Visit Diagnoses:    ICD-10-CM   1. Chronic bilateral low back pain with right-sided sciatica  G89.29 MR LUMBAR SPINE WO CONTRAST   M54.41     2. Lumbar radiculopathy  M54.16  MR LUMBAR SPINE WO CONTRAST    3. Facet arthropathy, lumbar  M47.816        Plan: Findings:  Chronic, worsening and severe bilateral lower back pain radiating down right posterolateral leg to ankle. Lower back pain seems to be most bothersome at this time. Patient continues to have severe pain despite good conservative therapies such as home exercise regimen, rest and use of medications. Patients clinical presentation and exam are complex, lower back pain does seem to be more facet mediated. She has lower back pain when moving from sitting to standing position. I do feel right sided leg symptoms are more radicular in nature. Central canal stenosis is a concern given her age and arthritic changes. We discussed treatment plan in detail today. Next step is to place order for lumbar MRI imaging. Depending on results of MRI imaging we discussed possibility of performing facet joint injection vs lumbar epidural steroid injection. I will see her back for lumbar MRI review and to discuss further treatments. She has no questions at this time. No red flag symptoms noted upon exam today.     Meds & Orders: No orders of the defined types were placed in this encounter.   Orders Placed This Encounter  Procedures   MR LUMBAR SPINE WO CONTRAST    Follow-up: Return for Lumbar MRI review.   Procedures: No procedures performed      Clinical History: No specialty comments available.   She reports that she has never smoked. She has never been exposed to tobacco smoke. She has never used smokeless  tobacco. No results for input(s): HGBA1C, LABURIC in the last 8760 hours.  Objective:  VS:  HT:    WT:   BMI:     BP:   HR: bpm  TEMP: ( )  RESP:  Physical Exam Vitals and nursing note reviewed.  HENT:     Head: Normocephalic and atraumatic.     Right Ear: External ear normal.     Left Ear: External ear normal.     Nose: Nose normal.     Mouth/Throat:     Mouth: Mucous membranes are moist.  Eyes:      Extraocular Movements: Extraocular movements intact.  Cardiovascular:     Rate and Rhythm: Normal rate.     Pulses: Normal pulses.  Pulmonary:     Effort: Pulmonary effort is normal.  Abdominal:     General: Abdomen is flat. There is no distension.  Musculoskeletal:        General: Tenderness present.     Cervical back: Normal range of motion.     Comments: Patient rises from seated position to standing without difficulty. Pain noted with facet loading and lumbar extension. 5/5 strength noted with bilateral hip flexion, knee flexion/extension, ankle dorsiflexion/plantarflexion and EHL. No clonus noted bilaterally. No pain upon palpation of greater trochanters. No pain with internal/external rotation of bilateral hips. Sensation intact bilaterally. Negative slump test bilaterally. Dysesthesias noted to right L5 and S1 dermatomes. Ambulates without aid, gait steady.     Skin:    General: Skin is warm and dry.     Capillary Refill: Capillary refill takes less than 2 seconds.  Neurological:     General: No focal deficit present.     Mental Status: She is alert and oriented to person, place, and time.  Psychiatric:        Mood and Affect: Mood normal.        Behavior: Behavior normal.     Ortho Exam  Imaging: No results found.  Past Medical/Family/Surgical/Social History: Medications & Allergies reviewed per EMR, new medications updated. 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   Abdominal bruit 12/31/2012   Encounter for Medicare annual wellness exam 12/31/2012   Degenerative joint disease of spine multilevel 03/07/2012   Spinal stenosis, lumbar 03/07/2012   Hip pain 09/02/2011   Sciatica of left side 08/14/2011   Spinal stenosis of lumbar region 08/14/2011   Encounter for long-term (current) use of other medications 06/25/2011   High risk medication use 12/21/2010   Need for lipid screening 12/21/2010   Foot pain, left 07/22/2010   CHEST PAIN 02/18/2010   Osteopenia 07/05/2009   Abnormal involuntary movement 01/26/2009   Backache 09/30/2008   NEOPLASM, SKIN, UNCERTAIN BEHAVIOR 05/27/2008   HYPERPOTASSEMIA 05/27/2008   Essential hypertension 10/12/2007   TRANSIENT GLOBAL AMNESIA 10/12/2007   VARICOSE VEINS, LOWER EXTREMITIES 06/06/2007   SYSTOLIC MURMUR 06/06/2007   ABDOMINAL BRUIT 06/06/2007   Allergic rhinitis 08/17/2006   GERD 08/17/2006   Osteoarthritis 08/17/2006   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 08/17/2006   Past Medical History:  Diagnosis Date   Abdominal pain, other specified site    Abnormal LFTs    Allergic rhinitis    Breast cancer (HCC)    Cervical back pain with evidence of disc disease    GERD (gastroesophageal reflux disease)    Heart murmur    says all her life had echo in [place] year ago not to worry   Hypertension    Nerve damage    left arm   Osteoarthritis    Osteopenia    Phlebitis    Spinal stenosis    Transient global amnesia    Family History  Problem Relation Age of Onset   COPD Mother    Osteoporosis Mother    Heart disease Mother    Hypertension Father    Alcohol abuse Father    Lung cancer Father        smoking hx   Hypertension Brother    Heart Problems Brother    Arthritis Brother    Heart attack Brother    Hypertension Brother    Cancer Maternal Grandmother        sinus   Prostate cancer Maternal Grandfather        no mets; dx <80   Healthy Daughter     Healthy Son    Leukemia Maternal Uncle        d. 8   Colon cancer Neg Hx    Pancreatic cancer Neg Hx    Stomach cancer Neg Hx    Past Surgical History:  Procedure Laterality Date   BREAST BIOPSY Right 12/07/2022   US  RT BREAST BX W LOC DEV 1ST LESION IMG BX SPEC US  GUIDE 12/07/2022 [facility]   BREAST LUMPECTOMY Right 12/2022   BUNIONECTOMY     fall 2022 [facility]   CHOLECYSTECTOMY     CYST EXCISION Right 01/2018   right thumb    FOOT SURGERY      x 2     HAND SURGERY     right hand   KNEE ARTHROSCOPY     right   REPLACEMENT UNICONDYLAR JOINT KNEE     rt knee   TONSILLECTOMY     TOTAL KNEE ARTHROPLASTY Left 06/2019   Social History   Occupational History   Occupation: retired Magazine features editor: RETIRED  Tobacco Use   Smoking status: Never    Passive exposure: Never   Smokeless tobacco: Never  Vaping Use   Vaping status: Never Used  Substance and Sexual Activity   Alcohol use: Yes    Alcohol/week: 14.0 standard drinks of alcohol    Types: 14 Glasses of wine per week   Drug use: Never   Sexual activity: Not on file

## 2023-09-20 ENCOUNTER — Encounter: Payer: Self-pay | Admitting: Physical Medicine and Rehabilitation

## 2023-09-21 ENCOUNTER — Ambulatory Visit
Admission: RE | Admit: 2023-09-21 | Discharge: 2023-09-21 | Disposition: A | Discharge status: Home / Self Care | Source: Ambulatory Visit | Attending: Physical Medicine and Rehabilitation | Admitting: Physical Medicine and Rehabilitation

## 2023-09-21 DIAGNOSIS — M5416 Radiculopathy, lumbar region: Secondary | ICD-10-CM

## 2023-09-21 DIAGNOSIS — G8929 Other chronic pain: Secondary | ICD-10-CM

## 2023-10-18 ENCOUNTER — Ambulatory Visit (INDEPENDENT_AMBULATORY_CARE_PROVIDER_SITE_OTHER): Admitting: Physical Medicine and Rehabilitation

## 2023-10-18 ENCOUNTER — Encounter: Payer: Self-pay | Admitting: Physical Medicine and Rehabilitation

## 2023-10-18 DIAGNOSIS — M5441 Lumbago with sciatica, right side: Secondary | ICD-10-CM

## 2023-10-18 DIAGNOSIS — M48062 Spinal stenosis, lumbar region with neurogenic claudication: Secondary | ICD-10-CM

## 2023-10-18 DIAGNOSIS — G8929 Other chronic pain: Secondary | ICD-10-CM

## 2023-10-18 DIAGNOSIS — M5416 Radiculopathy, lumbar region: Secondary | ICD-10-CM | POA: Diagnosis not present

## 2023-10-18 NOTE — Progress Notes (Signed)
 Briana Giles - 78 y.o. female MRN [id number]  Date of birth: [birth year]  Office Visit Note: Visit Date: 10/18/2023 PCP: Charlett Apolinar POUR, MD Referred by: Charlett Apolinar POUR, MD  Subjective: Chief Complaint  Patient presents with   Lower Back - Pain   HPI: Briana Giles is a 78 y.o. female who comes in today for evaluation of chronic, worsening and severe right sided lower back pain, intermittent radiation of pain to right posterolateral leg when sleeping. Lower back pain seems to be biggest pain generator for her. Pain ongoing for several years.  Her pain worsens with prolonged standing and walking. She has to stop and take breaks frequently when walking. She describes pain as sharp, shooting and sore sensation, currently rates as 8 out of 10. Some relief of pain with home exercise regimen, rest and use of medications. Some relief of pain with Tylenol and Ibuprofen. History of formal physical therapy with good relief of pain. She is fairly active person, exercises at [facility] multiple times a week. Recent lumbar MRI imaging shows worsened L4-L5 moderate spinal canal stenosis and mild bilateral foraminal stenosis. There is L2-L3 moderate spinal canal stenosis and mild left foraminal stenosis. Patient denies focal weakness, numbness and tingling. No recent trauma or falls.        Review of Systems  Musculoskeletal:  Positive for back pain.  Neurological:  Negative for tingling, sensory change, focal weakness and weakness.  All other systems reviewed and are negative.  Otherwise per HPI.  Assessment & Plan: Visit Diagnoses:    ICD-10-CM   1. Chronic right-sided low back pain with right-sided sciatica  M54.41    G89.29     2. Lumbar radiculopathy  M54.16     3. Spinal stenosis of lumbar region with neurogenic claudication  M48.062        Plan: Findings:  Chronic, worsening and severe right sided lower back pain, intermittent radiation of pain to right posterolateral leg when sleeping.   Patient continues to have severe pain despite good conservative therapies such as formal physical therapy, home exercise regimen, rest and use of medications.  I discussed recent lumbar MRI with patient today using imaging and spine model.  There is worsened moderate canal stenosis at the level of L4-L5 compared to prior MRI imaging in 2007.  Patient's clinical presentation and exam are complex.  Her symptoms today do sound more like neurogenic claudication as she does have pain with prolonged standing and walking, however she does report the most significant pain is localized to her lower back region.  We discussed treatment plan in detail today.  Next step is to perform diagnostic and hopefully therapeutic right L4-L5 interlaminar epidural steroid injection under fluoroscopic guidance.  She is not currently taking anticoagulant medication.  She has no questions regarding injection procedure.  Should her lower back pain persist postinjection we could also look at facet joint injections.  No red flag symptoms noted upon exam today.    Meds & Orders: No orders of the defined types were placed in this encounter.  No orders of the defined types were placed in this encounter.   Follow-up: Return for Right L4-L5 interlaminar epidural steroid injection.   Procedures: No procedures performed      Clinical History: EXAM: MRI LUMBAR SPINE 09/21/2023 05:49:17 PM   TECHNIQUE: Multiplanar multisequence MRI of the lumbar spine was performed without the administration of intravenous contrast.   COMPARISON: 09/16/2005   CLINICAL HISTORY: Low back pain, symptoms persist with >  6 wks treatment. Chronic low back pain for years; Worsening the past 1-2 yrs; No surgery or injections; History of breast cancer.   FINDINGS:   BONES AND ALIGNMENT: Grade 1 retrolisthesis at T12-L1, L1-2 and L2-3. Grade 1 anterolisthesis of L3-4.   SPINAL CORD: Incidental fatty filum terminale.   SOFT TISSUES: No  paraspinal mass.   T11-T12: Small disc bulge with endplate spurring. No spinal canal stenosis or foraminal stenosis.   T12-L1: Small disc bulge and endplate spurring. No spinal canal stenosis. Mild right and moderate left foraminal stenosis.   L1-L2: Small disc bulge and mild facet hypertrophy with mild spinal canal stenosis and mild right moderate left foraminal stenosis.   L2-L3: Left asymmetric disc bulge with moderate spinal canal stenosis. Mild left foraminal stenosis.   L3-L4: Moderate facet hypertrophy and left asymmetric disc bulge with left greater than right lateral recess narrowing. No central spinal canal stenosis or neural foraminal stenosis.   L4-L5: Moderate right facet hypertrophy and intermediate-sized disc bulge with endplate spurring and superimposed mild central extrusion with minimal superior migration. Moderate spinal canal stenosis and mild bilateral foraminal stenosis.   L5-S1: Mild facet arthrosis with no stenosis.   IMPRESSION: 1. Grade 1 retrolisthesis at T12-L1, L1-2, and L2-3, and grade 1 anterolisthesis of L3-4. 2. Worsened L4-5 moderate spinal canal stenosis and mild bilateral foraminal stenosis. 3.  Mild right and moderate left foraminal stenosis at T12-L1. 4. L1-2 with mild spinal canal stenosis and mild right/moderate left foraminal stenosis. 5. L2-3 moderate spinal canal stenosis and mild left foraminal stenosis.   Electronically signed by: Franky Stanford MD 10/01/2023 02:41 AM EDT RP Workstation: [id number]   She reports that she has never smoked. She has never been exposed to tobacco smoke. She has never used smokeless tobacco. No results for input(s): HGBA1C, LABURIC in the last 8760 hours.  Objective:  VS:  HT:    WT:   BMI:     BP:   HR: bpm  TEMP: ( )  RESP:  Physical Exam Vitals and nursing note reviewed.  HENT:     Head: Normocephalic and atraumatic.     Right Ear: External ear normal.     Left Ear: External ear  normal.     Nose: Nose normal.     Mouth/Throat:     Mouth: Mucous membranes are moist.  Eyes:     Extraocular Movements: Extraocular movements intact.  Cardiovascular:     Rate and Rhythm: Normal rate.     Pulses: Normal pulses.  Pulmonary:     Effort: Pulmonary effort is normal.  Abdominal:     General: Abdomen is flat. There is no distension.  Musculoskeletal:        General: Tenderness present.     Cervical back: Normal range of motion.     Comments: Patient rises from seated position to standing without difficulty. Pain noted with facet loading and lumbar extension. 5/5 strength noted with bilateral hip flexion, knee flexion/extension, ankle dorsiflexion/plantarflexion and EHL. No clonus noted bilaterally. No pain upon palpation of greater trochanters. No pain with internal/external rotation of bilateral hips. Sensation intact bilaterally. Negative slump test bilaterally. Ambulates without aid, gait steady.     Skin:    General: Skin is warm and dry.     Capillary Refill: Capillary refill takes less than 2 seconds.  Neurological:     General: No focal deficit present.     Mental Status: She is alert and oriented to person, place, and time.  Psychiatric:  Mood and Affect: Mood normal.        Behavior: Behavior normal.     Ortho Exam  Imaging: No results found.  Past Medical/Family/Surgical/Social History: Medications & Allergies reviewed per EMR, new medications updated. 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   Superior mesenteric artery stenosis (HCC) 02/22/2013   Abdominal bruit 12/31/2012   Encounter for Medicare annual wellness exam 12/31/2012   Degenerative joint disease of spine multilevel 03/07/2012   Spinal stenosis, lumbar 03/07/2012   Hip pain 09/02/2011   Sciatica of left side 08/14/2011   Spinal stenosis of lumbar region 08/14/2011   Encounter for long-term (current) use of other medications 06/25/2011   High risk medication use 12/21/2010   Need for lipid screening 12/21/2010   Foot pain, left 07/22/2010   CHEST PAIN 02/18/2010   Osteopenia 07/05/2009   Abnormal involuntary movement 01/26/2009   Backache 09/30/2008   NEOPLASM, SKIN, UNCERTAIN BEHAVIOR 05/27/2008   HYPERPOTASSEMIA 05/27/2008   Essential hypertension 10/12/2007   TRANSIENT GLOBAL AMNESIA 10/12/2007   VARICOSE VEINS, LOWER EXTREMITIES 06/06/2007   SYSTOLIC MURMUR 06/06/2007   ABDOMINAL BRUIT 06/06/2007   Allergic rhinitis 08/17/2006   GERD 08/17/2006   Osteoarthritis 08/17/2006   Disturbance in sleep behavior 08/17/2006   Past Medical History:  Diagnosis Date   Abdominal pain, other specified site    Abnormal LFTs    Allergic rhinitis    Breast cancer (HCC)    Cervical back pain with evidence of disc disease    GERD (gastroesophageal reflux disease)    Heart murmur    says all her life had echo in [place] year ago not to worry   Hypertension    Nerve damage    left arm   Osteoarthritis    Osteopenia    Phlebitis    Spinal stenosis    Transient global amnesia    Family History  Problem Relation Age of Onset   COPD Mother    Osteoporosis Mother    Heart disease Mother    Hypertension Father    Alcohol abuse Father    Lung cancer Father        smoking hx   Hypertension Brother    Heart Problems Brother    Arthritis Brother    Heart attack Brother    Hypertension Brother    Cancer Maternal Grandmother        sinus   Prostate cancer Maternal  Grandfather        no mets; dx <80   Healthy Daughter    Healthy Son    Leukemia Maternal Uncle        d. 8   Colon cancer Neg Hx    Pancreatic cancer Neg Hx    Stomach cancer Neg Hx    Past Surgical History:  Procedure Laterality Date   BREAST BIOPSY Right 12/07/2022   US  RT BREAST BX W LOC DEV 1ST LESION IMG BX SPEC US  GUIDE 12/07/2022 [facility]MO[facility]   BREAST LUMPECTOMY Right 12/2022   BUNIONECTOMY     fall 2022  [facility]   CHOLECYSTECTOMY     CYST EXCISION Right 01/2018   right thumb    FOOT SURGERY      x 2     HAND SURGERY     right hand   KNEE ARTHROSCOPY     right   REPLACEMENT UNICONDYLAR JOINT KNEE     rt knee   TONSILLECTOMY     TOTAL KNEE ARTHROPLASTY Left 06/2019   Social History   Occupational History   Occupation: retired Magazine features editor: RETIRED  Tobacco Use   Smoking status: Never    Passive exposure: Never   Smokeless tobacco: Never  Vaping Use   Vaping status: Never Used  Substance and Sexual Activity   Alcohol use: Yes    Alcohol/week: 14.0 standard drinks of alcohol    Types: 14 Glasses of wine per week   Drug use: Never   Sexual activity: Not on file

## 2023-10-18 NOTE — Progress Notes (Signed)
 Pain Scale   Average Pain 7 Patient advising she has lower back pain that increases when walking and decreases when sitting.         +Driver, -BT, -Dye Allergies.

## 2023-10-19 ENCOUNTER — Other Ambulatory Visit: Payer: Self-pay | Admitting: Internal Medicine

## 2023-10-28 ENCOUNTER — Other Ambulatory Visit: Payer: Self-pay | Admitting: Internal Medicine

## 2023-10-30 ENCOUNTER — Telehealth: Payer: Self-pay | Admitting: Physical Medicine and Rehabilitation

## 2023-10-30 ENCOUNTER — Other Ambulatory Visit: Payer: Self-pay | Admitting: Physical Medicine and Rehabilitation

## 2023-10-30 DIAGNOSIS — M5416 Radiculopathy, lumbar region: Secondary | ICD-10-CM

## 2023-10-30 DIAGNOSIS — G8929 Other chronic pain: Secondary | ICD-10-CM

## 2023-10-30 DIAGNOSIS — M48062 Spinal stenosis, lumbar region with neurogenic claudication: Secondary | ICD-10-CM

## 2023-10-30 NOTE — Telephone Encounter (Signed)
 Patient called and said no one ever gave her a call for the back injection. CB#[phone]

## 2023-11-22 ENCOUNTER — Ambulatory Visit (INDEPENDENT_AMBULATORY_CARE_PROVIDER_SITE_OTHER): Admitting: Physical Medicine and Rehabilitation

## 2023-11-22 ENCOUNTER — Other Ambulatory Visit: Payer: Self-pay

## 2023-11-22 VITALS — BP 156/80 | HR 80

## 2023-11-22 DIAGNOSIS — M5416 Radiculopathy, lumbar region: Secondary | ICD-10-CM | POA: Diagnosis not present

## 2023-11-22 MED ORDER — METHYLPREDNISOLONE ACETATE 80 MG/ML IJ SUSP
40.0000 mg | Freq: Once | INTRAMUSCULAR | Status: AC
  Administered 2023-11-22: 40 mg

## 2023-11-22 NOTE — Progress Notes (Signed)
 Pain Scale   Average Pain 8 Patient advising she has chronic lower back pain that increases in and and lessens in the pm. Patient advising er pain increases when walking alos        +Driver, -BT, -Dye Allergies.

## 2023-12-01 ENCOUNTER — Telehealth: Payer: Self-pay | Admitting: Physical Medicine and Rehabilitation

## 2023-12-01 NOTE — Telephone Encounter (Signed)
 Patient called and said Eldonna told her to let him know how she is doing. She is doing ok with injection but her Left side isn't. CB#[phone]

## 2023-12-04 NOTE — Progress Notes (Signed)
 Briana Giles - 78 y.o. female MRN [id number]  Date of birth: 1945-04-08  Office Visit Note: Visit Date: 11/22/2023 PCP: Charlett Apolinar POUR, MD Referred by: Charlett Apolinar POUR, MD  Subjective: Chief Complaint  Patient presents with   Lower Back - Pain   HPI:  Briana Giles is a 78 y.o. female who comes in today at the request of Duwaine Pouch, FNP for planned Right L4-5 Lumbar Interlaminar epidural steroid injection with fluoroscopic guidance.  The patient has failed conservative care including home exercise, medications, time and activity modification.  This injection will be diagnostic and hopefully therapeutic.  Please see requesting physician notes for further details and justification.   ROS Otherwise per HPI.  Assessment & Plan: Visit Diagnoses:    ICD-10-CM   1. Lumbar radiculopathy  M54.16 XR C-ARM NO REPORT    Epidural Steroid injection    methylPREDNISolone acetate (DEPO-MEDROL) injection 40 mg      Plan: No additional findings.   Meds & Orders:  Meds ordered this encounter  Medications   methylPREDNISolone acetate (DEPO-MEDROL) injection 40 mg    Orders Placed This Encounter  Procedures   XR C-ARM NO REPORT   Epidural Steroid injection    Follow-up: Return for visit to requesting provider as needed.   Procedures: No procedures performed  Lumbar Epidural Steroid Injection - Interlaminar Approach with Fluoroscopic Guidance  Patient: Briana Giles      Date of Birth: [birth year] MRN: [id number] PCP: Charlett Apolinar POUR, MD      Visit Date: 11/22/2023   Universal Protocol:     Consent Given By: the patient  Position: PRONE  Additional Comments: Vital signs were monitored before and after the procedure. Patient was prepped and draped in the usual sterile fashion. The correct patient, procedure, and site was verified.   Injection Procedure Details:   Procedure diagnoses: Lumbar radiculopathy [M54.16]   Meds Administered:  Meds ordered this encounter   Medications   methylPREDNISolone acetate (DEPO-MEDROL) injection 40 mg     Laterality: Right  Location/Site:  L4-5  Needle: 3.5 in., 20 ga. Tuohy  Needle Placement: Paramedian epidural  Findings:   -Comments: Excellent flow of contrast into the epidural space.  Procedure Details: Using a paramedian approach from the side mentioned above, the region overlying the inferior lamina was localized under fluoroscopic visualization and the soft tissues overlying this structure were infiltrated with 4 ml. of 1% Lidocaine  without Epinephrine. The Tuohy needle was inserted into the epidural space using a paramedian approach.   The epidural space was localized using loss of resistance along with counter oblique bi-planar fluoroscopic views.  After negative aspirate for air, blood, and CSF, a 2 ml. volume of Isovue-250 was injected into the epidural space and the flow of contrast was observed. Radiographs were obtained for documentation purposes.    The injectate was administered into the level noted above.   Additional Comments:  The patient tolerated the procedure well Dressing: 2 x 2 sterile gauze and Band-Aid    Post-procedure details: Patient was observed during the procedure. Post-procedure instructions were reviewed.  Patient left the clinic in stable condition.   Clinical History: EXAM: MRI LUMBAR SPINE 09/21/2023 05:49:17 PM   TECHNIQUE: Multiplanar multisequence MRI of the lumbar spine was performed without the administration of intravenous contrast.   COMPARISON: 09/16/2005   CLINICAL HISTORY: Low back pain, symptoms persist with > 6 wks treatment. Chronic low back pain for years; Worsening the past 1-2 yrs; No surgery or injections;  History of breast cancer.   FINDINGS:   BONES AND ALIGNMENT: Grade 1 retrolisthesis at T12-L1, L1-2 and L2-3. Grade 1 anterolisthesis of L3-4.   SPINAL CORD: Incidental fatty filum terminale.   SOFT TISSUES: No paraspinal  mass.   T11-T12: Small disc bulge with endplate spurring. No spinal canal stenosis or foraminal stenosis.   T12-L1: Small disc bulge and endplate spurring. No spinal canal stenosis. Mild right and moderate left foraminal stenosis.   L1-L2: Small disc bulge and mild facet hypertrophy with mild spinal canal stenosis and mild right moderate left foraminal stenosis.   L2-L3: Left asymmetric disc bulge with moderate spinal canal stenosis. Mild left foraminal stenosis.   L3-L4: Moderate facet hypertrophy and left asymmetric disc bulge with left greater than right lateral recess narrowing. No central spinal canal stenosis or neural foraminal stenosis.   L4-L5: Moderate right facet hypertrophy and intermediate-sized disc bulge with endplate spurring and superimposed mild central extrusion with minimal superior migration. Moderate spinal canal stenosis and mild bilateral foraminal stenosis.   L5-S1: Mild facet arthrosis with no stenosis.   IMPRESSION: 1. Grade 1 retrolisthesis at T12-L1, L1-2, and L2-3, and grade 1 anterolisthesis of L3-4. 2. Worsened L4-5 moderate spinal canal stenosis and mild bilateral foraminal stenosis. 3.  Mild right and moderate left foraminal stenosis at T12-L1. 4. L1-2 with mild spinal canal stenosis and mild right/moderate left foraminal stenosis. 5. L2-3 moderate spinal canal stenosis and mild left foraminal stenosis.   Electronically signed by: Franky Stanford MD 10/01/2023 02:41 AM EDT RP Workstation: [id number]     Objective:  VS:  HT:    WT:   BMI:     BP:(!) 156/80  HR:80bpm  TEMP: ( )  RESP:  Physical Exam Vitals and nursing note reviewed.  Constitutional:      General: She is not in acute distress.    Appearance: Normal appearance. She is not ill-appearing.  HENT:     Head: Normocephalic and atraumatic.     Right Ear: External ear normal.     Left Ear: External ear normal.  Eyes:     Extraocular Movements: Extraocular movements  intact.  Cardiovascular:     Rate and Rhythm: Normal rate.     Pulses: Normal pulses.  Pulmonary:     Effort: Pulmonary effort is normal. No respiratory distress.  Abdominal:     General: There is no distension.     Palpations: Abdomen is soft.  Musculoskeletal:        General: Tenderness present.     Cervical back: Neck supple.     Right lower leg: No edema.     Left lower leg: No edema.     Comments: Patient has good distal strength with no pain over the greater trochanters.  No clonus or focal weakness.  Skin:    Findings: No erythema, lesion or rash.  Neurological:     General: No focal deficit present.     Mental Status: She is alert and oriented to person, place, and time.     Sensory: No sensory deficit.     Motor: No weakness or abnormal muscle tone.     Coordination: Coordination normal.  Psychiatric:        Mood and Affect: Mood normal.        Behavior: Behavior normal.      Imaging: No results found.

## 2023-12-04 NOTE — Procedures (Signed)
 Lumbar Epidural Steroid Injection - Interlaminar Approach with Fluoroscopic Guidance  Patient: Briana Giles      Date of Birth: [birth year] MRN: [id number] PCP: Charlett Apolinar POUR, MD      Visit Date: 11/22/2023   Universal Protocol:     Consent Given By: the patient  Position: PRONE  Additional Comments: Vital signs were monitored before and after the procedure. Patient was prepped and draped in the usual sterile fashion. The correct patient, procedure, and site was verified.   Injection Procedure Details:   Procedure diagnoses: Lumbar radiculopathy [M54.16]   Meds Administered:  Meds ordered this encounter  Medications   methylPREDNISolone acetate (DEPO-MEDROL) injection 40 mg     Laterality: Right  Location/Site:  L4-5  Needle: 3.5 in., 20 ga. Tuohy  Needle Placement: Paramedian epidural  Findings:   -Comments: Excellent flow of contrast into the epidural space.  Procedure Details: Using a paramedian approach from the side mentioned above, the region overlying the inferior lamina was localized under fluoroscopic visualization and the soft tissues overlying this structure were infiltrated with 4 ml. of 1% Lidocaine  without Epinephrine. The Tuohy needle was inserted into the epidural space using a paramedian approach.   The epidural space was localized using loss of resistance along with counter oblique bi-planar fluoroscopic views.  After negative aspirate for air, blood, and CSF, a 2 ml. volume of Isovue-250 was injected into the epidural space and the flow of contrast was observed. Radiographs were obtained for documentation purposes.    The injectate was administered into the level noted above.   Additional Comments:  The patient tolerated the procedure well Dressing: 2 x 2 sterile gauze and Band-Aid    Post-procedure details: Patient was observed during the procedure. Post-procedure instructions were reviewed.  Patient left the clinic in stable condition.

## 2023-12-05 ENCOUNTER — Telehealth: Payer: Self-pay

## 2023-12-05 DIAGNOSIS — M47816 Spondylosis without myelopathy or radiculopathy, lumbar region: Secondary | ICD-10-CM

## 2023-12-05 NOTE — Telephone Encounter (Signed)
%   60 relief/ function ability Duration of relief/improvement--1 week Current pain score---7 Recent falls or injuries---None Location of pain is different and patient advising she is out of town and will call when she gets back.

## 2023-12-11 ENCOUNTER — Telehealth: Payer: Self-pay

## 2023-12-11 ENCOUNTER — Other Ambulatory Visit: Payer: Self-pay | Admitting: Physical Medicine and Rehabilitation

## 2023-12-11 MED ORDER — DIAZEPAM 5 MG PO TABS: ORAL_TABLET | ORAL | 0 refills | Status: DC

## 2023-12-11 NOTE — Telephone Encounter (Signed)
 Patient requesting pre procedural Valium 

## 2023-12-13 NOTE — Progress Notes (Unsigned)
 Office Visit Note  Patient: Briana Giles             Date of Birth: 16-Aug-1945           MRN: [id number]             PCP: Charlett Apolinar POUR, MD Referring: Charlett Apolinar POUR, MD Visit Date: 12/27/2023 Occupation: Data Unavailable  Subjective:  Arthralgias   History of Present Illness: Briana Giles is a 78 y.o. female with history of osteoarthritis.  Patient has intermittent arthralgias and joint stiffness but overall her symptoms have been manageable since she remains active.  Patient will be traveling to [facility] from December 2025 until May 2026.  Patient plans on continuing Silver sneaker classes while in [place].  She denies any joint swelling at this time.  She denies any nocturnal pain. Patient had an epidural injection in the lumbar spine on 11/22/2023 performed by Dr. Eldonna.  She had minimal improvement with the epidural injection and is hoping to proceed with a radiofrequency ablation again in the future.  This procedure has been very effective in the past. She denies any new medical conditions.  She denies any recent or recurrent infections.    Activities of Daily Living:  Patient reports morning stiffness for 30 minutes.   Patient Denies nocturnal pain.  Difficulty dressing/grooming: Denies Difficulty climbing stairs: Denies Difficulty getting out of chair: Denies Difficulty using hands for taps, buttons, cutlery, and/or writing: Denies  Review of Systems  Constitutional:  Negative for fatigue.  HENT:  Positive for mouth dryness. Negative for mouth sores.   Eyes:  Negative for dryness.  Respiratory:  Negative for shortness of breath.   Cardiovascular:  Negative for chest pain and palpitations.  Gastrointestinal:  Negative for blood in stool, constipation and diarrhea.  Endocrine: Negative for increased urination.  Genitourinary:  Positive for involuntary urination.  Musculoskeletal:  Positive for joint pain, joint pain and morning stiffness. Negative for gait problem,  joint swelling, myalgias, muscle weakness, muscle tenderness and myalgias.  Skin:  Negative for color change, rash, hair loss and sensitivity to sunlight.  Allergic/Immunologic: Negative for susceptible to infections.  Neurological:  Negative for dizziness and headaches.  Hematological:  Negative for swollen glands.  Psychiatric/Behavioral:  Negative for depressed mood and sleep disturbance. The patient is not nervous/anxious.     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   Abdominal bruit 12/31/2012   Encounter for Medicare annual wellness exam 12/31/2012   Degenerative joint disease of spine multilevel 03/07/2012   Spinal stenosis, lumbar 03/07/2012   Hip pain 09/02/2011   Sciatica of left side 08/14/2011   Spinal stenosis of lumbar region 08/14/2011   Encounter for long-term (current) use of other medications 06/25/2011   High risk medication use 12/21/2010   Need for lipid screening 12/21/2010   Foot pain, left 07/22/2010   CHEST PAIN 02/18/2010   Osteopenia 07/05/2009   Abnormal involuntary movement 01/26/2009   Backache 09/30/2008   NEOPLASM, SKIN, UNCERTAIN  BEHAVIOR 05/27/2008   HYPERPOTASSEMIA 05/27/2008   Essential hypertension 10/12/2007   TRANSIENT GLOBAL AMNESIA 10/12/2007   VARICOSE VEINS, LOWER EXTREMITIES 06/06/2007   SYSTOLIC MURMUR 06/06/2007   ABDOMINAL BRUIT 06/06/2007   Allergic rhinitis 08/17/2006   GERD  08/17/2006   Osteoarthritis 08/17/2006   Disturbance in sleep behavior 08/17/2006    Past Medical History:  Diagnosis Date   Abdominal pain, other specified site    Abnormal LFTs    Allergic rhinitis    Breast cancer (HCC)    Cervical back pain with evidence of disc disease    GERD (gastroesophageal reflux disease)    Heart murmur    says all her life had echo in [place] year ago not to worry   Hypertension    Nerve damage    left arm   Osteoarthritis    Osteopenia    Phlebitis    Spinal stenosis    Transient global amnesia     Family History  Problem Relation Age of Onset   COPD Mother    Osteoporosis Mother    Heart disease Mother    Hypertension Father    Alcohol abuse Father    Lung cancer Father        smoking hx   Hypertension Brother    Heart Problems Brother    Arthritis Brother    Heart attack Brother    Hypertension Brother    Cancer Maternal Grandmother        sinus   Prostate cancer Maternal Grandfather        no mets; dx <80   Healthy Daughter    Healthy Son    Leukemia Maternal Uncle        d. 8   Colon cancer Neg Hx    Pancreatic cancer Neg Hx    Stomach cancer Neg Hx    Past Surgical History:  Procedure Laterality Date   BREAST BIOPSY Right 12/07/2022   US  RT BREAST BX W LOC DEV 1ST LESION IMG BX SPEC US  GUIDE 12/07/2022 [facility]   BREAST LUMPECTOMY Right 12/2022   BUNIONECTOMY     fall 2022 [facility]   CHOLECYSTECTOMY     CYST EXCISION Right 01/2018   right thumb    FOOT SURGERY      x 2     HAND SURGERY     right hand   KNEE ARTHROSCOPY     right   REPLACEMENT UNICONDYLAR JOINT KNEE     rt knee   TONSILLECTOMY     TOTAL KNEE ARTHROPLASTY Left 06/2019   Social History   Tobacco Use   Smoking status: Never    Passive exposure: Never   Smokeless tobacco: Never  Vaping Use   Vaping status: Never Used  Substance Use Topics   Alcohol use: Yes    Alcohol/week: 14.0 standard drinks of alcohol    Types: 14 Glasses of  wine per week   Drug use: Never   Social History   Social History Narrative   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  [place] grand child    Does water exercise aerobics walking elliptical      Immunization History  Administered Date(s) Administered   Fluad Quad(high Dose 65+) 12/14/2020, 10/27/2021   INFLUENZA, HIGH DOSE SEASONAL PF 12/31/2013, 11/06/2014, 12/10/2016, 10/25/2017, 10/31/2018   Influenza Split 12/27/2011, 10/15/2012   Influenza Whole 12/03/2007, 12/24/2009, 10/25/2010  Influenza,inj,quad, With Preservative 11/10/2017   Influenza-Unspecified 11/14/2016, 10/16/2022   PFIZER(Purple Top)SARS-COV-2 Vaccination 03/08/2019, 03/30/2019, 10/30/2019, 11/03/2020, 10/27/2021   Pfizer Covid-19 Vaccine Bivalent Booster 29yrs & up 11/03/2020, 10/16/2022   Pneumococcal Conjugate-13 01/06/2014   Pneumococcal Polysaccharide-23 12/21/2010   Td 05/27/2008   Tdap 01/06/2014   Zoster Recombinant(Shingrix) 10/26/2017, 10/31/2018   Zoster, Live 12/03/2007     Objective: Vital Signs: BP 133/80   Pulse 97   Temp (!) 97.3 F (36.3 C)   Resp 14   Ht 5' 1 (1.549 m)   Wt 147 lb 3.2 oz (66.8 kg)   BMI 27.81 kg/m    Physical Exam Vitals and nursing note reviewed.  Constitutional:      Appearance: She is well-developed.  HENT:     Head: Normocephalic and atraumatic.  Eyes:     Conjunctiva/sclera: Conjunctivae normal.  Cardiovascular:     Rate and Rhythm: Normal rate and regular rhythm.     Heart sounds: Murmur heard.  Pulmonary:     Effort: Pulmonary effort is normal.     Breath sounds: Normal breath sounds.  Abdominal:     General: Bowel sounds are normal.     Palpations: Abdomen is soft.  Musculoskeletal:     Cervical back: Normal range of motion.  Lymphadenopathy:     Cervical: No cervical adenopathy.  Skin:    General: Skin is warm and dry.     Capillary Refill: Capillary refill takes less than  2 seconds.  Neurological:     Mental Status: She is alert and oriented to person, place, and time.  Psychiatric:        Behavior: Behavior normal.      Musculoskeletal Exam: C-spine has limited range of motion with lateral rotation.  Limited mobility of the lumbar spine.  Levoscoliosis noted.  Shoulder joints, elbow joints, wrist joints, MCPs, PIPs, DIPs have good range of motion with no synovitis.  PIP and DIP thickening consistent with osteoarthritis of both hands.  Hip joints have good range of motion with no groin pain.  Both knee replacements have slightly limited extension but no effusion.  Ankle joints have good range of motion no tenderness or joint swelling.  Right second hammertoe noted.  No tenderness of MTP joints.   CDAI Exam: CDAI Score: -- Patient Global: --; Provider Global: -- Swollen: --; Tender: -- Joint Exam 12/27/2023   No joint exam has been documented for this visit   There is currently no information documented on the homunculus. Go to the Rheumatology activity and complete the homunculus joint exam.  Investigation: No additional findings.  Imaging: No results found.   Recent Labs: Lab Results  Component Value Date   WBC 5.7 12/14/2022   HGB 14.4 12/14/2022   PLT 261 12/14/2022   NA 138 07/21/2023   K 4.3 07/21/2023   CL 100 07/21/2023   CO2 28 07/21/2023   GLUCOSE 95 07/21/2023   BUN 29 (H) 07/21/2023   CREATININE 1.35 (H) 07/21/2023   BILITOT 0.6 12/14/2022   ALKPHOS 99 12/14/2022   AST 26 12/14/2022   ALT 23 12/14/2022   PROT 7.7 12/14/2022   ALBUMIN 4.5 12/14/2022   CALCIUM 10.2 07/21/2023   GFRAA 68 12/15/2016    Speciality Comments: Patient discontinued tramadol  in April 2024  Procedures:  No procedures performed Allergies: Patient has no known allergies.   Assessment / Plan:     Visit Diagnoses: Primary osteoarthritis of both hands: She has PIP and DIP thickening consistent with osteoarthritis of  both hands.  No tenderness or  synovitis on examination today.  Complete fist formation bilaterally.  Discussed the importance of joint protection and muscle strengthening. She has noticed intermittent shaking of the right hand especially if resting her right forearm on something.  She does not experience this in the left hand.  Discussed that if the symptoms become more pronounced or last longer in duration referral to neurology can be placed for further evaluation.  S/P total knee arthroplasty, left: Doing well.  No effusion noted.  Patient remains active practicing yoga.  History of right partial knee replacement: Slight limited extension.  No effusion noted.  Primary osteoarthritis of both feet: Right second hammertoe noted.  No tenderness or synovitis of MTP joints.  Good range of motion of both ankle joints with no tenderness or synovitis.  History of bilateral bunionectomy  DDD (degenerative disc disease), cervical - X-rays of the C-spine updated on 02/01/21: Multilevel spondylosis and facet hypertrophy, greatest C4-C5 through C6-7.  C-spine has limited range of motion.  Degeneration of intervertebral disc of lumbar region without discogenic back pain or lower extremity pain - RFA-Dr. Eldonna in the past.  XR 03/16/2021 which were consistent with multilevel spondylosis with significant disc space narrowing at multiple levels. Levoscoliosis noted. Epidural injection performed on 11/22/2023 by Dr. Eldonna.  Previously had a great response to radiofrequency ablation. Not currently using Robaxin .  Other chronic pain: She takes Tylenol 1000 mg by mouth daily as needed for pain relief.  Osteopenia of multiple sites - DEXA ordered by PCP--updated on 10/26/2022: Lumbar spine T-score +0.7, right femoral neck T-score -1.2, left femoral neck T-score -1.3 She is taking calcium and vitamin D  supplement daily.  Other medical conditions are listed as follows:  Primary malignant neoplasm of upper outer quadrant of female breast, right  (HCC) - Scheduled to undergo a lumpectomy on 01/19/2023.  She finished radiation therapy in March 2025.  She is on letrozole.  Family history of rheumatoid arthritis  History of varicose veins  History of hypertension: Blood pressure was 133/80 today in the office.  History of gastroesophageal reflux (GERD)  Orders: No orders of the defined types were placed in this encounter.  No orders of the defined types were placed in this encounter.    Follow-Up Instructions: Return in about 7 months (around 07/26/2024) for Osteoarthritis.   Waddell CHRISTELLA Craze, PA-C  Note - This record has been created using Dragon software.  Chart creation errors have been sought, but may not always  have been located. Such creation errors do not reflect on  the standard of medical care.

## 2023-12-18 ENCOUNTER — Encounter: Payer: Self-pay | Admitting: Radiology

## 2023-12-19 ENCOUNTER — Ambulatory Visit: Admitting: Physician Assistant

## 2023-12-27 ENCOUNTER — Encounter: Payer: Self-pay | Admitting: Physician Assistant

## 2023-12-27 ENCOUNTER — Ambulatory Visit: Attending: Physician Assistant | Admitting: Physician Assistant

## 2023-12-27 VITALS — BP 133/80 | HR 97 | Temp 97.3°F | Resp 14 | Ht 61.0 in | Wt 147.2 lb

## 2023-12-27 DIAGNOSIS — M19072 Primary osteoarthritis, left ankle and foot: Secondary | ICD-10-CM

## 2023-12-27 DIAGNOSIS — Z96652 Presence of left artificial knee joint: Secondary | ICD-10-CM

## 2023-12-27 DIAGNOSIS — G8929 Other chronic pain: Secondary | ICD-10-CM

## 2023-12-27 DIAGNOSIS — M19041 Primary osteoarthritis, right hand: Secondary | ICD-10-CM

## 2023-12-27 DIAGNOSIS — C50411 Malignant neoplasm of upper-outer quadrant of right female breast: Secondary | ICD-10-CM

## 2023-12-27 DIAGNOSIS — M5030 Other cervical disc degeneration, unspecified cervical region: Secondary | ICD-10-CM

## 2023-12-27 DIAGNOSIS — M67449 Ganglion, unspecified hand: Secondary | ICD-10-CM

## 2023-12-27 DIAGNOSIS — Z8679 Personal history of other diseases of the circulatory system: Secondary | ICD-10-CM

## 2023-12-27 DIAGNOSIS — Z8719 Personal history of other diseases of the digestive system: Secondary | ICD-10-CM

## 2023-12-27 DIAGNOSIS — Z96659 Presence of unspecified artificial knee joint: Secondary | ICD-10-CM | POA: Diagnosis not present

## 2023-12-27 DIAGNOSIS — M19042 Primary osteoarthritis, left hand: Secondary | ICD-10-CM

## 2023-12-27 DIAGNOSIS — Z8261 Family history of arthritis: Secondary | ICD-10-CM

## 2023-12-27 DIAGNOSIS — M8589 Other specified disorders of bone density and structure, multiple sites: Secondary | ICD-10-CM

## 2023-12-27 DIAGNOSIS — Z9889 Other specified postprocedural states: Secondary | ICD-10-CM

## 2023-12-27 DIAGNOSIS — M51369 Other intervertebral disc degeneration, lumbar region without mention of lumbar back pain or lower extremity pain: Secondary | ICD-10-CM

## 2023-12-27 DIAGNOSIS — M19071 Primary osteoarthritis, right ankle and foot: Secondary | ICD-10-CM

## 2024-01-01 ENCOUNTER — Other Ambulatory Visit: Payer: Self-pay

## 2024-01-01 ENCOUNTER — Ambulatory Visit: Admitting: Physical Medicine and Rehabilitation

## 2024-01-01 VITALS — BP 145/83 | HR 59

## 2024-01-01 DIAGNOSIS — M47816 Spondylosis without myelopathy or radiculopathy, lumbar region: Secondary | ICD-10-CM | POA: Diagnosis not present

## 2024-01-01 MED ORDER — METHYLPREDNISOLONE ACETATE 40 MG/ML IJ SUSP
40.0000 mg | Freq: Once | INTRAMUSCULAR | Status: AC
  Administered 2024-01-01: 40 mg

## 2024-01-01 NOTE — Progress Notes (Signed)
 Pain Scale   Average Pain 5 Patient advising she has lower back pain that increasing when walking and decreases when sitting        +Driver, -BT, -Dye Allergies.

## 2024-01-07 NOTE — Procedures (Signed)
 Lumbar Facet Joint Intra-Articular Injection(s) with Fluoroscopic Guidance  Patient: Briana Giles      Date of Birth: 1946/01/05 MRN: [id number] PCP: Charlett Apolinar POUR, MD      Visit Date: 01/01/2024   Universal Protocol:    Date/Time: 01/01/2024  Consent Given By: the patient  Position: PRONE   Additional Comments: Vital signs were monitored before and after the procedure. Patient was prepped and draped in the usual sterile fashion. The correct patient, procedure, and site was verified.   Injection Procedure Details:  Procedure Site One Meds Administered:  Meds ordered this encounter  Medications   methylPREDNISolone  acetate (DEPO-MEDROL ) injection 40 mg     Laterality: Bilateral  Location/Site:  L3-L4 L4-L5  Needle size: 22 guage  Needle type: Spinal  Needle Placement: Articular  Findings:  -Comments: Excellent flow of contrast producing a partial arthrogram.  Procedure Details: The fluoroscope beam is vertically oriented in AP, and the inferior recess is visualized beneath the lower pole of the inferior apophyseal process, which represents the target point for needle insertion. When direct visualization is difficult the target point is located at the medial projection of the vertebral pedicle. The region overlying each aforementioned target is locally anesthetized with a 1 to 2 ml. volume of 1% Lidocaine  without Epinephrine.   The spinal needle was inserted into each of the above mentioned facet joints using biplanar fluoroscopic guidance. A 0.25 to 0.5 ml. volume of Isovue-250 was injected and a partial facet joint arthrogram was obtained. A single spot film was obtained of the resulting arthrogram.    One to 1.25 ml of the steroid/anesthetic solution was then injected into each of the facet joints noted above.   Additional Comments:  No complications occurred Dressing: 2 x 2 sterile gauze and Band-Aid    Post-procedure details: Patient was observed during  the procedure. Post-procedure instructions were reviewed.  Patient left the clinic in stable condition.

## 2024-01-07 NOTE — Progress Notes (Signed)
 Briana Giles - 78 y.o. female MRN [id number]  Date of birth: 24-Mar-1945  Office Visit Note: Visit Date: 01/01/2024 PCP: Charlett Apolinar POUR, MD Referred by: Charlett Apolinar POUR, MD  Subjective: Chief Complaint  Patient presents with   Lower Back - Pain   HPI:  Briana Giles is a 78 y.o. female who comes in today at the request of Duwaine Pouch, FNP for planned Bilateral  L3-4 and L4-5 Lumbar facet/medial branch block with fluoroscopic guidance.  The patient has failed conservative care including home exercise, medications, time and activity modification.  This injection will be diagnostic and hopefully therapeutic.  Please see requesting physician notes for further details and justification.  Exam has shown concordant pain with facet joint loading.   ROS Otherwise per HPI.  Assessment & Plan: Visit Diagnoses:    ICD-10-CM   1. Spondylosis without myelopathy or radiculopathy, lumbar region  M47.816 XR C-ARM NO REPORT    Facet Injection    methylPREDNISolone  acetate (DEPO-MEDROL ) injection 40 mg      Plan: No additional findings.   Meds & Orders:  Meds ordered this encounter  Medications   methylPREDNISolone  acetate (DEPO-MEDROL ) injection 40 mg    Orders Placed This Encounter  Procedures   Facet Injection   XR C-ARM NO REPORT    Follow-up: Return for visit to requesting provider as needed.   Procedures: No procedures performed  Lumbar Facet Joint Intra-Articular Injection(s) with Fluoroscopic Guidance  Patient: Ronal NOVAK Gaines      Date of Birth: August 23, 1945 MRN: [id number] PCP: Charlett Apolinar POUR, MD      Visit Date: 01/01/2024   Universal Protocol:    Date/Time: 01/01/2024  Consent Given By: the patient  Position: PRONE   Additional Comments: Vital signs were monitored before and after the procedure. Patient was prepped and draped in the usual sterile fashion. The correct patient, procedure, and site was verified.   Injection Procedure Details:  Procedure Site  One Meds Administered:  Meds ordered this encounter  Medications   methylPREDNISolone  acetate (DEPO-MEDROL ) injection 40 mg     Laterality: Bilateral  Location/Site:  L3-L4 L4-L5  Needle size: 22 guage  Needle type: Spinal  Needle Placement: Articular  Findings:  -Comments: Excellent flow of contrast producing a partial arthrogram.  Procedure Details: The fluoroscope beam is vertically oriented in AP, and the inferior recess is visualized beneath the lower pole of the inferior apophyseal process, which represents the target point for needle insertion. When direct visualization is difficult the target point is located at the medial projection of the vertebral pedicle. The region overlying each aforementioned target is locally anesthetized with a 1 to 2 ml. volume of 1% Lidocaine  without Epinephrine.   The spinal needle was inserted into each of the above mentioned facet joints using biplanar fluoroscopic guidance. A 0.25 to 0.5 ml. volume of Isovue-250 was injected and a partial facet joint arthrogram was obtained. A single spot film was obtained of the resulting arthrogram.    One to 1.25 ml of the steroid/anesthetic solution was then injected into each of the facet joints noted above.   Additional Comments:  No complications occurred Dressing: 2 x 2 sterile gauze and Band-Aid    Post-procedure details: Patient was observed during the procedure. Post-procedure instructions were reviewed.  Patient left the clinic in stable condition.    Clinical History: EXAM: MRI LUMBAR SPINE 09/21/2023 05:49:17 PM   TECHNIQUE: Multiplanar multisequence MRI of the lumbar spine was performed without the administration of  intravenous contrast.   COMPARISON: 09/16/2005   CLINICAL HISTORY: Low back pain, symptoms persist with > 6 wks treatment. Chronic low back pain for years; Worsening the past 1-2 yrs; No surgery or injections; History of breast cancer.   FINDINGS:   BONES AND  ALIGNMENT: Grade 1 retrolisthesis at T12-L1, L1-2 and L2-3. Grade 1 anterolisthesis of L3-4.   SPINAL CORD: Incidental fatty filum terminale.   SOFT TISSUES: No paraspinal mass.   T11-T12: Small disc bulge with endplate spurring. No spinal canal stenosis or foraminal stenosis.   T12-L1: Small disc bulge and endplate spurring. No spinal canal stenosis. Mild right and moderate left foraminal stenosis.   L1-L2: Small disc bulge and mild facet hypertrophy with mild spinal canal stenosis and mild right moderate left foraminal stenosis.   L2-L3: Left asymmetric disc bulge with moderate spinal canal stenosis. Mild left foraminal stenosis.   L3-L4: Moderate facet hypertrophy and left asymmetric disc bulge with left greater than right lateral recess narrowing. No central spinal canal stenosis or neural foraminal stenosis.   L4-L5: Moderate right facet hypertrophy and intermediate-sized disc bulge with endplate spurring and superimposed mild central extrusion with minimal superior migration. Moderate spinal canal stenosis and mild bilateral foraminal stenosis.   L5-S1: Mild facet arthrosis with no stenosis.   IMPRESSION: 1. Grade 1 retrolisthesis at T12-L1, L1-2, and L2-3, and grade 1 anterolisthesis of L3-4. 2. Worsened L4-5 moderate spinal canal stenosis and mild bilateral foraminal stenosis. 3.  Mild right and moderate left foraminal stenosis at T12-L1. 4. L1-2 with mild spinal canal stenosis and mild right/moderate left foraminal stenosis. 5. L2-3 moderate spinal canal stenosis and mild left foraminal stenosis.   Electronically signed by: Franky Stanford MD 10/01/2023 02:41 AM EDT RP Workstation: [id number]     Objective:  VS:  HT:    WT:   BMI:     BP:(!) 145/83  HR:(!) 59bpm  TEMP: ( )  RESP:  Physical Exam Vitals and nursing note reviewed.  Constitutional:      General: She is not in acute distress.    Appearance: Normal appearance. She is not  ill-appearing.  HENT:     Head: Normocephalic and atraumatic.     Right Ear: External ear normal.     Left Ear: External ear normal.  Eyes:     Extraocular Movements: Extraocular movements intact.  Cardiovascular:     Rate and Rhythm: Normal rate.     Pulses: Normal pulses.  Pulmonary:     Effort: Pulmonary effort is normal. No respiratory distress.  Abdominal:     General: There is no distension.     Palpations: Abdomen is soft.  Musculoskeletal:        General: Tenderness present.     Cervical back: Neck supple.     Right lower leg: No edema.     Left lower leg: No edema.     Comments: Patient has good distal strength with no pain over the greater trochanters.  No clonus or focal weakness.  Skin:    Findings: No erythema, lesion or rash.  Neurological:     General: No focal deficit present.     Mental Status: She is alert and oriented to person, place, and time.     Sensory: No sensory deficit.     Motor: No weakness or abnormal muscle tone.     Coordination: Coordination normal.  Psychiatric:        Mood and Affect: Mood normal.        Behavior:  Behavior normal.      Imaging: No results found.

## 2024-01-30 ENCOUNTER — Other Ambulatory Visit: Payer: Self-pay | Admitting: Internal Medicine

## 2024-02-02 ENCOUNTER — Telehealth: Payer: Self-pay | Admitting: Physical Medicine and Rehabilitation

## 2024-02-02 NOTE — Telephone Encounter (Signed)
 Pt called saying that she is spending 6 months in [facility] and that Dr. Eldonna was supposed to refer her to someone there and she hasn't heard anything.call back number is [phone]

## 2024-07-24 ENCOUNTER — Ambulatory Visit: Admitting: Physician Assistant
